# Patient Record
Sex: Female | Born: 1945
Health system: Southern US, Community
[De-identification: ages and names within clinical notes are randomized; demographics above are authoritative.]

## PROBLEM LIST (undated history)

## (undated) DIAGNOSIS — I4891 Unspecified atrial fibrillation: Secondary | ICD-10-CM

## (undated) DIAGNOSIS — I480 Paroxysmal atrial fibrillation: Secondary | ICD-10-CM

## (undated) DIAGNOSIS — E785 Hyperlipidemia, unspecified: Secondary | ICD-10-CM

## (undated) DIAGNOSIS — Z8673 Personal history of transient ischemic attack (TIA), and cerebral infarction without residual deficits: Secondary | ICD-10-CM

## (undated) DIAGNOSIS — K219 Gastro-esophageal reflux disease without esophagitis: Secondary | ICD-10-CM

## (undated) DIAGNOSIS — G4730 Sleep apnea, unspecified: Secondary | ICD-10-CM

## (undated) DIAGNOSIS — I10 Essential (primary) hypertension: Secondary | ICD-10-CM

## (undated) DIAGNOSIS — E119 Type 2 diabetes mellitus without complications: Secondary | ICD-10-CM

## (undated) DIAGNOSIS — A6920 Lyme disease, unspecified: Secondary | ICD-10-CM

## (undated) DIAGNOSIS — F32A Depression, unspecified: Secondary | ICD-10-CM

## (undated) DIAGNOSIS — F329 Major depressive disorder, single episode, unspecified: Secondary | ICD-10-CM

## (undated) DIAGNOSIS — E079 Disorder of thyroid, unspecified: Secondary | ICD-10-CM

## (undated) DIAGNOSIS — G8929 Other chronic pain: Secondary | ICD-10-CM

## (undated) HISTORY — DX: Disorder of thyroid, unspecified: E07.9

## (undated) HISTORY — PX: CERVICAL SPINE SURGERY: SHX589

## (undated) HISTORY — DX: Lyme disease, unspecified: A69.20

## (undated) HISTORY — DX: Depression, unspecified: F32.A

## (undated) HISTORY — DX: Personal history of transient ischemic attack (TIA), and cerebral infarction without residual deficits: Z86.73

## (undated) HISTORY — PX: LUMBAR SPINE SURGERY: SHX701

## (undated) HISTORY — DX: Essential (primary) hypertension: I10

## (undated) HISTORY — PX: CARDIAC CATHETERIZATION: SHX172

## (undated) HISTORY — PX: ATRIAL FIBRILLATION ABLATION: EP1191

## (undated) HISTORY — DX: Hyperlipidemia, unspecified: E78.5

## (undated) HISTORY — DX: Other chronic pain: G89.29

## (undated) HISTORY — DX: Type 2 diabetes mellitus without complications: E11.9

## (undated) HISTORY — PX: TONSILLECTOMY AND ADENOIDECTOMY: SUR1326

## (undated) HISTORY — DX: Gastro-esophageal reflux disease without esophagitis: K21.9

## (undated) HISTORY — DX: Unspecified atrial fibrillation: I48.91

## (undated) HISTORY — DX: Paroxysmal atrial fibrillation: I48.0

---
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
## 2016-11-03 LAB — HM COLONOSCOPY

## 2017-11-07 HISTORY — PX: OTHER SURGICAL HISTORY: SHX169

## 2018-12-07 DIAGNOSIS — Z4509 Encounter for adjustment and management of other cardiac device: Secondary | ICD-10-CM | POA: Diagnosis not present

## 2018-12-28 ENCOUNTER — Ambulatory Visit: Payer: Self-pay | Admitting: Medical

## 2019-01-02 ENCOUNTER — Other Ambulatory Visit: Payer: Self-pay

## 2019-01-02 ENCOUNTER — Encounter: Payer: Self-pay | Admitting: Medical

## 2019-01-02 ENCOUNTER — Ambulatory Visit (INDEPENDENT_AMBULATORY_CARE_PROVIDER_SITE_OTHER): Payer: Medicare HMO | Admitting: Medical

## 2019-01-02 VITALS — BP 170/80 | HR 84 | Temp 98.7°F | Ht 61.25 in | Wt 211.6 lb

## 2019-01-02 DIAGNOSIS — M62838 Other muscle spasm: Secondary | ICD-10-CM | POA: Diagnosis not present

## 2019-01-02 DIAGNOSIS — E785 Hyperlipidemia, unspecified: Secondary | ICD-10-CM | POA: Insufficient documentation

## 2019-01-02 DIAGNOSIS — Z8673 Personal history of transient ischemic attack (TIA), and cerebral infarction without residual deficits: Secondary | ICD-10-CM

## 2019-01-02 DIAGNOSIS — M5440 Lumbago with sciatica, unspecified side: Secondary | ICD-10-CM | POA: Diagnosis not present

## 2019-01-02 DIAGNOSIS — G4733 Obstructive sleep apnea (adult) (pediatric): Secondary | ICD-10-CM | POA: Insufficient documentation

## 2019-01-02 DIAGNOSIS — E119 Type 2 diabetes mellitus without complications: Secondary | ICD-10-CM | POA: Insufficient documentation

## 2019-01-02 DIAGNOSIS — R0683 Snoring: Secondary | ICD-10-CM | POA: Insufficient documentation

## 2019-01-02 DIAGNOSIS — R7989 Other specified abnormal findings of blood chemistry: Secondary | ICD-10-CM | POA: Insufficient documentation

## 2019-01-02 DIAGNOSIS — Z79899 Other long term (current) drug therapy: Secondary | ICD-10-CM | POA: Diagnosis not present

## 2019-01-02 DIAGNOSIS — I10 Essential (primary) hypertension: Secondary | ICD-10-CM | POA: Insufficient documentation

## 2019-01-02 DIAGNOSIS — M542 Cervicalgia: Secondary | ICD-10-CM

## 2019-01-02 DIAGNOSIS — E1169 Type 2 diabetes mellitus with other specified complication: Secondary | ICD-10-CM

## 2019-01-02 DIAGNOSIS — K760 Fatty (change of) liver, not elsewhere classified: Secondary | ICD-10-CM | POA: Diagnosis not present

## 2019-01-02 DIAGNOSIS — G8929 Other chronic pain: Secondary | ICD-10-CM | POA: Insufficient documentation

## 2019-01-02 MED ORDER — GABAPENTIN 800 MG PO TABS: 800.0000 mg | ORAL_TABLET | Freq: Three times a day (TID) | ORAL | 2 refills | Status: DC

## 2019-01-02 MED ORDER — METHOCARBAMOL 500 MG PO TABS: 500.0000 mg | ORAL_TABLET | Freq: Two times a day (BID) | ORAL | 0 refills | Status: DC

## 2019-01-02 MED ORDER — OMEPRAZOLE 20 MG PO CPDR: 20.0000 mg | DELAYED_RELEASE_CAPSULE | Freq: Every day | ORAL | 1 refills | Status: DC

## 2019-01-02 NOTE — Progress Notes (Signed)
Subjective:  Amerah Blomgren is a 73 y.o. female who presents for Chief Complaint  Patient presents with  . New Patient (Initial Visit)    get established-need refills      Here to establish care.  Her husband came in yesterday for the same.  They moved recently from [place] to [facility] area.  She has several medical problems, was seeing several specialist in [place] including cardiology, endocrinology, pain management and neurology.  She is diabetic.  She checks her blood sugars regularly.  Morning sugars are typically running around 150.  Last hemoglobin A1c was 6.8% a few months ago.  No polydipsia, no polyuria, no weight change.  Hypertension-before leaving [place] her metoprolol was stopped and carvedilol was added.  Home blood pressure readings have been fine.  However she notes extreme fatigue in the mornings and sleepiness after taking her morning medications.  This has been going on for months now.  She had a new diagnosis of Lyme disease few months ago was on antibiotic for this.  She notes a history of fatty liver disease and elevated liver test.  Last ultrasound was several years ago  She has chronic pains in her neck and back.  She takes 800 mg gabapentin 3 times a day, tizanidine twice daily, Cymbalta once daily for mood and pain.  She has a history of surgery in the neck and back.  Has a history of management by pain management, has a history of epidural steroid injections.  She also had a spinal stimulator at one point.  She is left-handed. PT in past, was supposed to repeat but covid happened  She does not drink very much alcohol.  She has a history of sleep apnea, last sleep study more than 5 years ago, does not tolerate her current mask.  She reports a history of TIA in past,   No other aggravating or relieving factors.    No other c/o.  The following portions of the patient's history were reviewed and updated as appropriate: allergies,  current medications, past family history, past medical history, past social history, past surgical history and problem list.  ROS Otherwise as in subjective above   Objective: BP (!) 170/80   Pulse 84   Temp 98.7 F (37.1 C)   Ht 5' 1.25" (1.556 m)   Wt 211 lb 9.6 oz (96 kg)   SpO2 98%   BMI 39.66 kg/m   General appearance: alert, no distress, well developed, well nourished Neck: supple, no lymphadenopathy, no thyromegaly, no masses, anterior cervical surgical scar  Heart: 2/6 brief holosystolic murmur heard best in upper sternal borders, RRR, normal S1, S2 Lungs: CTA bilaterally, no wheezes, rhonchi, or rales Abdomen: +bs, soft, non tender, non distended, no masses, no hepatomegaly, no splenomegaly Pulses: 2+ radial pulses, 2+ pedal pulses, normal cap refill Ext: no edema    Assessment: Encounter Diagnoses  Name Primary?  . Type 2 diabetes mellitus with other specified complication, without long-term current use of insulin ([place]) Yes  . Hyperlipidemia, unspecified hyperlipidemia type   . Chronic bilateral low back pain with sciatica, sciatica laterality unspecified   . High risk medication use   . History of TIA (transient ischemic attack)   . Muscle spasm   . Chronic neck pain   . Essential hypertension, benign   . Fatty liver   . Elevated LFTs   . Snoring   . OSA (obstructive sleep apnea)      Plan: I reviewed her  chart history, her medical records on her new patient form.  We will request prior records.  We will go ahead make referral for cardiology to get her established here.  Labs as below today.  We discussed goals for blood pressure and diabetes, routine follow-up.  Her main concern today is chronic pain in the neck and back.  She also gets quite a bit of sedation on these medications.  I will change her tizanidine to Robaxin to see if this makes any difference.  Consider lowering the morning dose of gabapentin.  We discussed risks and benefits of these  medications.  Consider pain management consult.  Fatty liver disease-discussed the need for weight loss, healthy diet and exercise.  It may be time to update ultrasound.  OSA-discussed possibly seeing home health care about mask fitting since she does not tolerate her current mask  Hypertension-continue current medication  hyperlipidemia-continue Zetia, not tolerated statins in the past  Margalit was seen today for new patient (initial visit).  Diagnoses and all orders for this visit:  Type 2 diabetes mellitus with other specified complication, without long-term current use of insulin (HCC) -     Comprehensive metabolic panel -     Hemoglobin A1c -     Lipid Panel -     Ambulatory referral to Cardiology  Hyperlipidemia, unspecified hyperlipidemia type -     Comprehensive metabolic panel -     Hemoglobin A1c -     Lipid Panel -     Ambulatory referral to Cardiology  Chronic bilateral low back pain with sciatica, sciatica laterality unspecified  High risk medication use -     Comprehensive metabolic panel -     Hemoglobin A1c -     Lipid Panel  History of TIA (transient ischemic attack) -     Ambulatory referral to Cardiology  Muscle spasm  Chronic neck pain  Essential hypertension, benign  Fatty liver  Elevated LFTs  Snoring  OSA (obstructive sleep apnea)  Other orders -     gabapentin (NEURONTIN) 800 MG tablet; Take 1 tablet (800 mg total) by mouth 3 (three) times daily. -     methocarbamol (ROBAXIN) 500 MG tablet; Take 1 tablet (500 mg total) by mouth 2 (two) times daily. -     omeprazole (PRILOSEC) 20 MG capsule; Take 1 capsule (20 mg total) by mouth daily.    Follow up: Pending labs

## 2019-01-03 ENCOUNTER — Encounter: Payer: Self-pay | Admitting: Medical

## 2019-01-03 LAB — COMPREHENSIVE METABOLIC PANEL
ALT: 76 IU/L — ABNORMAL HIGH (ref 0–32)
AST: 88 IU/L — ABNORMAL HIGH (ref 0–40)
Albumin/Globulin Ratio: 1.8 (ref 1.2–2.2)
Albumin: 4.5 g/dL (ref 3.7–4.7)
Alkaline Phosphatase: 120 IU/L — ABNORMAL HIGH (ref 39–117)
BUN/Creatinine Ratio: 18 (ref 12–28)
BUN: 13 mg/dL (ref 8–27)
Bilirubin Total: 0.3 mg/dL (ref 0.0–1.2)
CO2: 21 mmol/L (ref 20–29)
Calcium: 10.1 mg/dL (ref 8.7–10.3)
Chloride: 103 mmol/L (ref 96–106)
Creatinine, Ser: 0.74 mg/dL (ref 0.57–1.00)
GFR calc Af Amer: 94 mL/min/{1.73_m2} (ref >59)
GFR calc non Af Amer: 82 mL/min/{1.73_m2} (ref >59)
Globulin, Total: 2.5 g/dL (ref 1.5–4.5)
Glucose: 159 mg/dL — ABNORMAL HIGH (ref 65–99)
Potassium: 4.9 mmol/L (ref 3.5–5.2)
Sodium: 140 mmol/L (ref 134–144)
Total Protein: 7 g/dL (ref 6.0–8.5)

## 2019-01-03 LAB — HEMOGLOBIN A1C
Est. average glucose Bld gHb Est-mCnc: 160 mg/dL
Hgb A1c MFr Bld: 7.2 % — ABNORMAL HIGH (ref 4.8–5.6)

## 2019-01-03 LAB — LIPID PANEL
Chol/HDL Ratio: 5.1 ratio — ABNORMAL HIGH (ref 0.0–4.4)
Cholesterol, Total: 226 mg/dL — ABNORMAL HIGH (ref 100–199)
HDL: 44 mg/dL (ref >39)
LDL Chol Calc (NIH): 149 mg/dL — ABNORMAL HIGH (ref 0–99)
Triglycerides: 182 mg/dL — ABNORMAL HIGH (ref 0–149)
VLDL Cholesterol Cal: 33 mg/dL (ref 5–40)

## 2019-01-18 ENCOUNTER — Encounter: Payer: Self-pay | Admitting: Medical

## 2019-01-23 DIAGNOSIS — Z760 Encounter for issue of repeat prescription: Secondary | ICD-10-CM | POA: Diagnosis not present

## 2019-01-30 DIAGNOSIS — K30 Functional dyspepsia: Secondary | ICD-10-CM | POA: Diagnosis not present

## 2019-01-30 DIAGNOSIS — G629 Polyneuropathy, unspecified: Secondary | ICD-10-CM | POA: Diagnosis not present

## 2019-01-30 DIAGNOSIS — I10 Essential (primary) hypertension: Secondary | ICD-10-CM | POA: Diagnosis not present

## 2019-01-30 DIAGNOSIS — F419 Anxiety disorder, unspecified: Secondary | ICD-10-CM | POA: Diagnosis not present

## 2019-01-30 DIAGNOSIS — M069 Rheumatoid arthritis, unspecified: Secondary | ICD-10-CM | POA: Diagnosis not present

## 2019-01-30 DIAGNOSIS — E119 Type 2 diabetes mellitus without complications: Secondary | ICD-10-CM | POA: Diagnosis not present

## 2019-01-30 DIAGNOSIS — E039 Hypothyroidism, unspecified: Secondary | ICD-10-CM | POA: Diagnosis not present

## 2019-01-30 DIAGNOSIS — I482 Chronic atrial fibrillation, unspecified: Secondary | ICD-10-CM | POA: Diagnosis not present

## 2019-01-30 DIAGNOSIS — E7800 Pure hypercholesterolemia, unspecified: Secondary | ICD-10-CM | POA: Diagnosis not present

## 2019-02-05 ENCOUNTER — Other Ambulatory Visit: Payer: Self-pay | Admitting: Medical

## 2019-02-07 NOTE — Progress Notes (Deleted)
Referring-David Tysinger, PA-C Reason for referral-hypertension and hyperlipidemia.  HPI: 74 year old female for evaluation of hypertension and hyperlipidemia at request of Chana Bode, PA-C.  Patient previously resided in [place].  Current Outpatient Medications  Medication Sig Dispense Refill  . apixaban (ELIQUIS) 5 MG TABS tablet Take 5 mg by mouth 2 (two) times daily.    Marland Kitchen aspirin 81 MG EC tablet Take 81 mg by mouth daily. Swallow whole.    . carvedilol (COREG) 25 MG tablet Take 25 mg by mouth 2 (two) times daily with a meal.    . diltiazem (DILACOR XR) 180 MG 24 hr capsule Take 180 mg by mouth daily.    . DULoxetine (CYMBALTA) 60 MG capsule Take 60 mg by mouth daily.    Marland Kitchen ezetimibe (ZETIA) 10 MG tablet Take 10 mg by mouth daily.    . folic acid (FOLVITE) 1 MG tablet Take 1 mg by mouth daily.    Marland Kitchen gabapentin (NEURONTIN) 800 MG tablet Take 1 tablet (800 mg total) by mouth 3 (three) times daily. 90 tablet 2  . levothyroxine (SYNTHROID) 75 MCG tablet Take 75 mcg by mouth daily before breakfast.    . lisinopril (ZESTRIL) 10 MG tablet Take 10 mg by mouth daily.    . methocarbamol (ROBAXIN) 500 MG tablet TAKE 1 TABLET BY MOUTH TWICE A DAY 60 tablet 0  . omeprazole (PRILOSEC) 20 MG capsule Take 20 mg by mouth daily.    Marland Kitchen omeprazole (PRILOSEC) 20 MG capsule Take 1 capsule (20 mg total) by mouth daily. 90 capsule 1  . OXYBUTYNIN CHLORIDE PO Take 10 mg by mouth daily.    . sitaGLIPtin-metformin (JANUMET) 50-1000 MG tablet Take 1 tablet by mouth 2 (two) times daily with a meal.    . tiZANidine (ZANAFLEX) 4 MG tablet Take 4 mg by mouth every 12 (twelve) hours as needed for muscle spasms.     No current facility-administered medications for this visit.    Allergies  Allergen Reactions  . Baclofen Swelling  . Crestor [Rosuvastatin]     Extreme myalgias  . Lipitor [Atorvastatin]     Extreme myalgias  . Verapamil Swelling    Past Medical History:  Diagnosis Date  . Chronic pain     . Depression   . Diabetes mellitus without complication ([place])   . GERD (gastroesophageal reflux disease)   . History of TIA (transient ischemic attack)   . Hyperlipidemia   . Hypertension   . Thyroid disease     *** The histories are not reviewed yet. Please review them in the "History" navigator section and refresh this [place].  Social History   Socioeconomic History  . Marital status: Married    Spouse name: Not on file  . Number of children: Not on file  . Years of education: Not on file  . Highest education level: Not on file  Occupational History  . Not on file  Tobacco Use  . Smoking status: Former Research scientist (life sciences)  . Smokeless tobacco: Never Used  Substance and Sexual Activity  . Alcohol use: Yes  . Drug use: Never  . Sexual activity: Not on file  Other Topics Concern  . Not on file  Social History Narrative   From [place], married, Cyril Witness, new from [place] to [facility] 12/2018   Social Determinants of Health   Financial Resource Strain:   . Difficulty of Paying Living Expenses: Not on file  Food Insecurity:   . Worried About Estate manager/land agent  of Food in the Last Year: Not on file  . Ran Out of Food in the Last Year: Not on file  Transportation Needs:   . Lack of Transportation (Medical): Not on file  . Lack of Transportation (Non-Medical): Not on file  Physical Activity:   . Days of Exercise per Week: Not on file  . Minutes of Exercise per Session: Not on file  Stress:   . Feeling of Stress : Not on file  Social Connections:   . Frequency of Communication with Friends and Family: Not on file  . Frequency of Social Gatherings with Friends and Family: Not on file  . Attends Religious Services: Not on file  . Active Member of Clubs or Organizations: Not on file  . Attends Archivist Meetings: Not on file  . Marital Status: Not on file  Intimate Partner Violence:   . Fear of Current or Ex-Partner: Not on file  . Emotionally  Abused: Not on file  . Physically Abused: Not on file  . Sexually Abused: Not on file    No family history on file.  ROS: no fevers or chills, productive cough, hemoptysis, dysphasia, odynophagia, melena, hematochezia, dysuria, hematuria, rash, seizure activity, orthopnea, PND, pedal edema, claudication. Remaining systems are negative.  Physical Exam:   There were no vitals taken for this visit.  General:  Well developed/well nourished in NAD Skin warm/dry Patient not depressed No peripheral clubbing Back-normal HEENT-normal/normal eyelids Neck supple/normal carotid upstroke bilaterally; no bruits; no JVD; no thyromegaly chest - CTA/ normal expansion CV - RRR/normal S1 and S2; no murmurs, rubs or gallops;  PMI nondisplaced Abdomen -NT/ND, no HSM, no mass, + bowel sounds, no bruit 2+ femoral pulses, no bruits Ext-no edema, chords, 2+ DP Neuro-grossly nonfocal  ECG - personally reviewed  A/P  1 hypertension-  2 hyperlipidemia-  Kirk Ruths, MD

## 2019-02-14 ENCOUNTER — Ambulatory Visit: Payer: Medicare HMO | Admitting: Cardiology

## 2019-02-21 DIAGNOSIS — K30 Functional dyspepsia: Secondary | ICD-10-CM | POA: Diagnosis not present

## 2019-02-21 DIAGNOSIS — G629 Polyneuropathy, unspecified: Secondary | ICD-10-CM | POA: Diagnosis not present

## 2019-02-21 DIAGNOSIS — I482 Chronic atrial fibrillation, unspecified: Secondary | ICD-10-CM | POA: Diagnosis not present

## 2019-02-21 DIAGNOSIS — E7800 Pure hypercholesterolemia, unspecified: Secondary | ICD-10-CM | POA: Diagnosis not present

## 2019-02-21 DIAGNOSIS — F419 Anxiety disorder, unspecified: Secondary | ICD-10-CM | POA: Diagnosis not present

## 2019-02-21 DIAGNOSIS — E119 Type 2 diabetes mellitus without complications: Secondary | ICD-10-CM | POA: Diagnosis not present

## 2019-02-21 DIAGNOSIS — M069 Rheumatoid arthritis, unspecified: Secondary | ICD-10-CM | POA: Diagnosis not present

## 2019-02-21 DIAGNOSIS — I10 Essential (primary) hypertension: Secondary | ICD-10-CM | POA: Diagnosis not present

## 2019-02-21 DIAGNOSIS — E039 Hypothyroidism, unspecified: Secondary | ICD-10-CM | POA: Diagnosis not present

## 2019-02-28 ENCOUNTER — Other Ambulatory Visit: Payer: Self-pay | Admitting: Medical

## 2019-02-28 NOTE — Telephone Encounter (Signed)
[facility] is requesting to fill pt robaxin. Please advise [facility]

## 2019-02-28 NOTE — Telephone Encounter (Signed)
Schedule fasting f/u appt

## 2019-03-01 NOTE — Telephone Encounter (Signed)
Patient is in [place] and won't be back until May. She is seeing an urgent care doctor in [place] until she gets back here. She will check with urgent care.

## 2019-03-21 DIAGNOSIS — I482 Chronic atrial fibrillation, unspecified: Secondary | ICD-10-CM | POA: Diagnosis not present

## 2019-03-21 DIAGNOSIS — E782 Mixed hyperlipidemia: Secondary | ICD-10-CM | POA: Diagnosis not present

## 2019-03-21 DIAGNOSIS — I10 Essential (primary) hypertension: Secondary | ICD-10-CM | POA: Diagnosis not present

## 2019-03-21 DIAGNOSIS — F419 Anxiety disorder, unspecified: Secondary | ICD-10-CM | POA: Diagnosis not present

## 2019-03-21 DIAGNOSIS — K30 Functional dyspepsia: Secondary | ICD-10-CM | POA: Diagnosis not present

## 2019-03-21 DIAGNOSIS — D539 Nutritional anemia, unspecified: Secondary | ICD-10-CM | POA: Diagnosis not present

## 2019-03-21 DIAGNOSIS — M069 Rheumatoid arthritis, unspecified: Secondary | ICD-10-CM | POA: Diagnosis not present

## 2019-03-21 DIAGNOSIS — E119 Type 2 diabetes mellitus without complications: Secondary | ICD-10-CM | POA: Diagnosis not present

## 2019-03-21 DIAGNOSIS — E7800 Pure hypercholesterolemia, unspecified: Secondary | ICD-10-CM | POA: Diagnosis not present

## 2019-03-21 DIAGNOSIS — G629 Polyneuropathy, unspecified: Secondary | ICD-10-CM | POA: Diagnosis not present

## 2019-03-21 DIAGNOSIS — E039 Hypothyroidism, unspecified: Secondary | ICD-10-CM | POA: Diagnosis not present

## 2019-03-26 ENCOUNTER — Other Ambulatory Visit: Payer: Self-pay | Admitting: Medical

## 2019-03-27 NOTE — Telephone Encounter (Signed)
Needs fasting med check or phsyical if last visit was not physical.   Due for f/u now before refills

## 2019-03-27 NOTE — Telephone Encounter (Signed)
Called pt and she is in [place] and wont be back until around May. She said she will call and schedule once she has an accurate date

## 2019-03-29 ENCOUNTER — Telehealth: Payer: Self-pay | Admitting: Family Medicine

## 2019-03-29 NOTE — Telephone Encounter (Signed)
PT in [place] until May.  She needs AWV/cpe.  Email sent

## 2019-04-16 ENCOUNTER — Other Ambulatory Visit: Payer: Self-pay | Admitting: Medical

## 2019-04-18 DIAGNOSIS — E119 Type 2 diabetes mellitus without complications: Secondary | ICD-10-CM | POA: Diagnosis not present

## 2019-04-18 DIAGNOSIS — G629 Polyneuropathy, unspecified: Secondary | ICD-10-CM | POA: Diagnosis not present

## 2019-04-18 DIAGNOSIS — E039 Hypothyroidism, unspecified: Secondary | ICD-10-CM | POA: Diagnosis not present

## 2019-04-18 DIAGNOSIS — E7800 Pure hypercholesterolemia, unspecified: Secondary | ICD-10-CM | POA: Diagnosis not present

## 2019-04-18 DIAGNOSIS — M069 Rheumatoid arthritis, unspecified: Secondary | ICD-10-CM | POA: Diagnosis not present

## 2019-04-18 DIAGNOSIS — K30 Functional dyspepsia: Secondary | ICD-10-CM | POA: Diagnosis not present

## 2019-04-18 DIAGNOSIS — F419 Anxiety disorder, unspecified: Secondary | ICD-10-CM | POA: Diagnosis not present

## 2019-04-18 DIAGNOSIS — I10 Essential (primary) hypertension: Secondary | ICD-10-CM | POA: Diagnosis not present

## 2019-04-18 DIAGNOSIS — I482 Chronic atrial fibrillation, unspecified: Secondary | ICD-10-CM | POA: Diagnosis not present

## 2019-04-27 ENCOUNTER — Telehealth: Payer: Self-pay

## 2019-04-27 NOTE — Telephone Encounter (Signed)
I received a fax from [facility] for a refill on the pts. Oxybutynin, Glyburide, and Methocarbamol. Pt. Last apt was 01/02/19.

## 2019-04-27 NOTE — Telephone Encounter (Signed)
Is this appropriate?  

## 2019-04-30 ENCOUNTER — Other Ambulatory Visit: Payer: Self-pay | Admitting: Medical

## 2019-04-30 ENCOUNTER — Other Ambulatory Visit: Payer: Self-pay

## 2019-04-30 MED ORDER — METHOCARBAMOL 500 MG PO TABS: 500.0000 mg | ORAL_TABLET | Freq: Two times a day (BID) | ORAL | 0 refills | Status: DC

## 2019-04-30 NOTE — Telephone Encounter (Signed)
Patient has an appointment on 4/28. Would you still like to send 30 day now?

## 2019-04-30 NOTE — Telephone Encounter (Signed)
She is due for follow up now , so make sure we have appt on the books fasting  Reconcile dose and medication info for Glyburide and Oxybutynin as Glyburide doesn't even show up, and I don't think I was aware she was on this, and verify dosing for oxybutynin.  You can send 30 day supply of Glyburide and Oxybutynin to get her by until appt

## 2019-04-30 NOTE — Telephone Encounter (Signed)
Lmom for patient to call and inform us whether she is taking Oxybutynin and Glyburide.

## 2019-04-30 NOTE — Telephone Encounter (Signed)
I sent the methocarbamol already.   Yes verify the 2 others and send please.   They are not updated in the chart

## 2019-05-02 ENCOUNTER — Other Ambulatory Visit: Payer: Self-pay

## 2019-05-02 ENCOUNTER — Ambulatory Visit (INDEPENDENT_AMBULATORY_CARE_PROVIDER_SITE_OTHER): Payer: Medicare HMO | Admitting: Medical

## 2019-05-02 ENCOUNTER — Encounter: Payer: Self-pay | Admitting: Medical

## 2019-05-02 VITALS — BP 174/96 | HR 96 | Temp 98.1°F | Ht 61.25 in | Wt 215.4 lb

## 2019-05-02 DIAGNOSIS — M5440 Lumbago with sciatica, unspecified side: Secondary | ICD-10-CM

## 2019-05-02 DIAGNOSIS — Z79899 Other long term (current) drug therapy: Secondary | ICD-10-CM | POA: Diagnosis not present

## 2019-05-02 DIAGNOSIS — R0789 Other chest pain: Secondary | ICD-10-CM | POA: Diagnosis not present

## 2019-05-02 DIAGNOSIS — Z0000 Encounter for general adult medical examination without abnormal findings: Secondary | ICD-10-CM | POA: Diagnosis not present

## 2019-05-02 DIAGNOSIS — E785 Hyperlipidemia, unspecified: Secondary | ICD-10-CM | POA: Diagnosis not present

## 2019-05-02 DIAGNOSIS — Z6841 Body Mass Index (BMI) 40.0 and over, adult: Secondary | ICD-10-CM | POA: Diagnosis not present

## 2019-05-02 DIAGNOSIS — E039 Hypothyroidism, unspecified: Secondary | ICD-10-CM | POA: Insufficient documentation

## 2019-05-02 DIAGNOSIS — Z136 Encounter for screening for cardiovascular disorders: Secondary | ICD-10-CM | POA: Insufficient documentation

## 2019-05-02 DIAGNOSIS — Z8673 Personal history of transient ischemic attack (TIA), and cerebral infarction without residual deficits: Secondary | ICD-10-CM

## 2019-05-02 DIAGNOSIS — I10 Essential (primary) hypertension: Secondary | ICD-10-CM

## 2019-05-02 DIAGNOSIS — K760 Fatty (change of) liver, not elsewhere classified: Secondary | ICD-10-CM | POA: Diagnosis not present

## 2019-05-02 DIAGNOSIS — E041 Nontoxic single thyroid nodule: Secondary | ICD-10-CM | POA: Insufficient documentation

## 2019-05-02 DIAGNOSIS — R7989 Other specified abnormal findings of blood chemistry: Secondary | ICD-10-CM

## 2019-05-02 DIAGNOSIS — G4733 Obstructive sleep apnea (adult) (pediatric): Secondary | ICD-10-CM

## 2019-05-02 DIAGNOSIS — R0683 Snoring: Secondary | ICD-10-CM

## 2019-05-02 DIAGNOSIS — Z1231 Encounter for screening mammogram for malignant neoplasm of breast: Secondary | ICD-10-CM

## 2019-05-02 DIAGNOSIS — M65321 Trigger finger, right index finger: Secondary | ICD-10-CM | POA: Insufficient documentation

## 2019-05-02 DIAGNOSIS — M542 Cervicalgia: Secondary | ICD-10-CM

## 2019-05-02 DIAGNOSIS — E1169 Type 2 diabetes mellitus with other specified complication: Secondary | ICD-10-CM

## 2019-05-02 DIAGNOSIS — G8929 Other chronic pain: Secondary | ICD-10-CM

## 2019-05-02 DIAGNOSIS — R0989 Other specified symptoms and signs involving the circulatory and respiratory systems: Secondary | ICD-10-CM | POA: Insufficient documentation

## 2019-05-02 DIAGNOSIS — M62838 Other muscle spasm: Secondary | ICD-10-CM

## 2019-05-02 MED ORDER — ALPRAZOLAM 0.25 MG PO TABS: 0.2500 mg | ORAL_TABLET | Freq: Two times a day (BID) | ORAL | 0 refills | Status: DC | PRN

## 2019-05-02 NOTE — Telephone Encounter (Signed)
Patient was seen in office today.  

## 2019-05-02 NOTE — Progress Notes (Addendum)
Subjective:    Sandra Ruiz is a 74 y.o. female who presents for Preventative Services visit and chronic medical problems/med check visit.    Primary Care Provider Tysinger, David S, PA-C here for primary care  Current Health Care Team:  Dentist, n/a  Eye doctor, n/a  Medical Services you may have received from other than [facility] providers in the past year (date may be approximate) none  Exercise Current exercise habits: stretching 3 days a week for 30 minutes.    Nutrition/Diet Current diet: no particular discretion  Depression Screen Depression screen PHQ 2/9 05/02/2019  Decreased Interest 0  Down, Depressed, Hopeless 0  PHQ - 2 Score 0    Activities of Daily Living Screen/Functional Status Survey Is the patient deaf or have difficulty hearing?: No Does the patient have difficulty seeing, even when wearing glasses/contacts?: Yes Does the patient have difficulty concentrating, remembering, or making decisions?: Yes(sometimes) Does the patient have difficulty walking or climbing stairs?: Yes Does the patient have difficulty dressing or bathing?: No Does the patient have difficulty doing errands alone such as visiting a doctor's office or shopping?: Yes  Can patient draw a clock face showing 3:15 oclock, yes  Fall Risk Screen Fall Risk  05/02/2019 01/02/2019  Falls in the past year? 1 1  Number falls in past yr: 1 1  Injury with Fall? 0 0    Gait Assessment: Normal gait observed  - yes  Advanced directives Does patient have a Health Care Power of Attorney? No Does patient have a Living Will? No  Past Medical History:  Diagnosis Date  . Chronic pain   . Depression   . Diabetes mellitus without complication (HCC)   . GERD (gastroesophageal reflux disease)   . History of TIA (transient ischemic attack)   . Hyperlipidemia   . Hypertension   . Thyroid disease      Social History   Socioeconomic History  . Marital status: Married    Spouse name: Not on file   . Number of children: Not on file  . Years of education: Not on file  . Highest education level: Not on file  Occupational History  . Not on file  Tobacco Use  . Smoking status: Former Smoker  . Smokeless tobacco: Never Used  Substance and Sexual Activity  . Alcohol use: Yes  . Drug use: Never  . Sexual activity: Not on file  Other Topics Concern  . Not on file  Social History Narrative   From [place], married, Jehov[employer]'s Witness, new from [place] to [place] 12/2018   Social Determinants of Health   Financial Resource Strain:   . Difficulty of Paying Living Expenses:   Food Insecurity:   . Worried About Running Out of Food in the Last Year:   . Ran Out of Food in the Last Year:   Transportation Needs:   . Lack of Transportation (Medical):   . Lack of Transportation (Non-Medical):   Physical Activity:   . Days of Exercise per Week:   . Minutes of Exercise per Session:   Stress:   . Feeling of Stress :   Social Connections:   . Frequency of Communication with Friends and Family:   . Frequency of Social Gatherings with Friends and Family:   . Attends Religious Services:   . Active Member of Clubs or Organizations:   . Attends Club or Organization Meetings:   . Marital Status:   Intimate Partner Violence:   . Fear of Current   or Ex-Partner:   . Emotionally Abused:   . Physically Abused:   . Sexually Abused:     No family history on file.   Current Outpatient Medications:  .  ACCU-CHEK GUIDE test strip, See admin instructions., Disp: , Rfl:  .  apixaban (ELIQUIS) 5 MG TABS tablet, Take 5 mg by mouth 2 (two) times daily., Disp: , Rfl:  .  Blood Glucose Monitoring Suppl (ACCU-CHEK GUIDE ME) w/Device KIT, See admin instructions., Disp: , Rfl:  .  carvedilol (COREG) 25 MG tablet, Take 25 mg by mouth 2 (two) times daily with a meal., Disp: , Rfl:  .  diltiazem (DILACOR XR) 180 MG 24 hr capsule, Take 180 mg by mouth daily., Disp: , Rfl:  .  DULoxetine  (CYMBALTA) 60 MG capsule, Take 60 mg by mouth daily., Disp: , Rfl:  .  ezetimibe (ZETIA) 10 MG tablet, Take 10 mg by mouth daily., Disp: , Rfl:  .  folic acid (FOLVITE) 1 MG tablet, Take 1 mg by mouth daily., Disp: , Rfl:  .  gabapentin (NEURONTIN) 800 MG tablet, TAKE 1 TABLET BY MOUTH THREE TIMES A DAY, Disp: 90 tablet, Rfl: 0 .  glyBURIDE (DIABETA) 1.25 MG tablet, Take 1.25 mg by mouth every morning., Disp: , Rfl:  .  levothyroxine (SYNTHROID) 75 MCG tablet, Take 75 mcg by mouth daily before breakfast., Disp: , Rfl:  .  lisinopril (ZESTRIL) 30 MG tablet, Take 30 mg by mouth daily., Disp: , Rfl:  .  methocarbamol (ROBAXIN) 500 MG tablet, Take 1 tablet (500 mg total) by mouth 2 (two) times daily., Disp: 60 tablet, Rfl: 0 .  omeprazole (PRILOSEC) 20 MG capsule, Take 1 capsule (20 mg total) by mouth daily., Disp: 90 capsule, Rfl: 1 .  oxybutynin (DITROPAN-XL) 10 MG 24 hr tablet, Take 10 mg by mouth 2 (two) times daily., Disp: , Rfl:  .  sitaGLIPtin-metformin (JANUMET) 50-1000 MG tablet, Take 1 tablet by mouth 2 (two) times daily with a meal., Disp: , Rfl:  .  ALPRAZolam (XANAX) 0.25 MG tablet, Take 1 tablet (0.25 mg total) by mouth 2 (two) times daily as needed for anxiety., Disp: 20 tablet, Rfl: 0 .  aspirin 81 MG EC tablet, Take 81 mg by mouth daily. Swallow whole., Disp: , Rfl:   Allergies  Allergen Reactions  . Baclofen Swelling  . Crestor [Rosuvastatin]     Extreme myalgias  . Lipitor [Atorvastatin]     Extreme myalgias  . Verapamil Swelling    History reviewed: allergies, current medications, past family history, past medical history, past social history, past surgical history and problem list  Specific issues discussed: She was a new patient to me in December 2020.  This is her second visit here.  She has several medical problems, was seeing several specialist in [place] including cardiology, endocrinology, pain management and neurology.  She is diabetic.    She is not checking  her sugars every single day at the moment.  For the last several months she and her husband were staying down in [place] since last visit here.  She saw a doctor in [place] briefly who recently had glyburide.  She does not like the way this makes her feel.  She has longstanding numbness in feet.  No new foot lesions.  No polyuria or polydipsia.  She is compliant with Janumet  Hypertension-she has been taking diltiazem daily.  She saw the other doctor in [place] recently and was increased to lisinopril 30 mg daily.  She   just realized today that she has not been taking her carvedilol for several weeks at least    She notes a history of fatty liver disease and elevated liver test.  Last ultrasound was several years ago  She notes a history of thyroid enlargement and 3 nodules.  She is due for ultrasound of thyroid.  She was seeing endocrinology back in [place]   She has chronic pains in her neck and back.  She takes 800 mg gabapentin 3 times a day.  Last visit we changed her to Robaxin.  She continues on Cymbalta.  Lately she has been more pain particularly with the hands.  Her right index finger locks and has a trigger action.  She has had steroid shots for this in the past.  She has a history of surgery in the neck and back.  Has a history of management by pain management, has a history of epidural steroid injections.  She also had a spinal stimulator at one point.  She is left-handed.  She notes her current pain is not controlled  She does not drink very much alcohol.  She has a history of sleep apnea, last sleep study more than 5 years ago, does not tolerate her current mask.  She reports a history of TIA in past,   She has been a little bit more anxious lately given her blood pressures being up, not tolerating the new medicines as well, and getting confused about her medications.  She would like a few Xanax to help with calming her nerves.  She has used this in the past and lately just does  not feel good  She has her first appointment with cardiology with Dr. Crenshaw May 26  She is compliant with Eliquis  She has felt some heaviness in the chest of late, not sure if this is related to her anxiety or pain in general heart.  No specific shortness of breath, palpitations, syncope or dizziness    Objective:      Biometrics BP (!) 174/96   Pulse 96   Temp 98.1 F (36.7 C)   Ht 5' 1.25" (1.556 m)   Wt 215 lb 6.4 oz (97.7 kg)   SpO2 94%   BMI 40.37 kg/m   Cognitive Testing  Alert? Yes  Normal Appearance?Yes  Oriented to person? Yes  Place? Yes   Time? Yes  Recall of three objects?  Yes  Can perform simple calculations? Yes  Displays appropriate judgment?Yes  Can read the correct time from a watch face?Yes  General appearance: alert, no distress, WD/WN, white female  Nutritional Status: Inadequate calore intake? no Loss of muscle mass? no Loss of fat beneath skin? no Localized or general edema? no Diminished functional status? no  Other pertinent exam: Neck: supple, no lymphadenopathy, no thyromegaly, no masses, questionable left carotid bruit Heart: RRR, normal S1, S2, questionable systolic murmurs heard in upper sternal borders Lungs: CTA bilaterally, no wheezes, rhonchi, or rales Abdomen: +bs, soft, non tender, non distended, no masses, no hepatomegaly, no splenomegaly Musculoskeletal: +lumbar surgical scar, nontender, no swelling, no obvious deformity Extremities: no edema, no cyanosis, no clubbing Pulses: 2+ symmetric, upper and 1+ extremities, normal cap refill Neurological: alert, oriented x 3, CN2-12 intact, strength normal upper extremities and lower extremities, sensation normal throughout, DTRs 2+ throughout, no cerebellar signs, gait normal Psychiatric: seems a little upset today, in pain othewrise pleasant , answers questions appropriately Ext: no edema  Diabetic Foot Exam - Simple   Simple Foot Form   Diabetic Foot exam was performed with  the following findings: Yes 05/02/2019  1:40 PM  Visual Inspection No deformities, no ulcerations, no other skin breakdown bilaterally: Yes Sensation Testing See comments: Yes Pulse Check See comments: Yes Comments 1+ pedal pulses, unable to feel monofilament sensation throughout     EKG Indication chest discomfort, hypertension Rate 88 bpm, PR 150 ms, QRS 82 ms, QTC 438 ms, axis 62 degrees, normal sinus rhythm, no acute changes   Assessment:   Encounter Diagnoses  Name Primary?  . Encounter for health maintenance examination in adult Yes  . Medicare annual wellness visit, subsequent   . Thyroid nodule   . Essential hypertension, benign   . OSA (obstructive sleep apnea)   . Fatty liver   . Type 2 diabetes mellitus with other specified complication, without long-term current use of insulin (HCC)   . Hyperlipidemia, unspecified hyperlipidemia type   . High risk medication use   . History of TIA (transient ischemic attack)   . Chronic bilateral low back pain with sciatica, sciatica laterality unspecified   . Chronic neck pain   . Elevated LFTs   . Snoring   . Muscle spasm   . Trigger index finger of right hand   . Encounter for screening mammogram for malignant neoplasm of breast   . Hypothyroidism, unspecified type   . Bruit   . Encounter for screening for vascular disease   . Chest discomfort      Plan:   A preventative services visit was completed today.  During the course of the visit today, we discussed and counseled about appropriate screening and preventive services.  A health risk assessment was established today that included a review of current medications, allergies, social history, family history, medical and preventative health history, biometrics, and preventative screenings to identify potential safety concerns or impairments.  A personalized plan was printed today for your records and use.   Personalized health advice and education was given today to  reduce health risks and promote self management and wellness.  Information regarding end of life planning was discussed today.  I reviewed the recent comprehensive panel of labs she does have done about 2 weeks ago  Recommendations:  I recommend a yearly ophthalmology/optometry visit for glaucoma screening and eye checkup  I recommended a yearly dental visit for hygiene and checkup  Advanced directives - discussed nature and purpose of Advanced Directives, encouraged them to complete them if they have not done so and/or encouraged them to get us a copy if they have done this already.  Diabetes   Check your feet daily for wounds or sores not healing  Avoid going barefooted  See your eye doctor every year for diabetic eye screening  Check your blood sugars every day fasting in the morning and write these numbers down  Continue glyburide just recently added in [place]  Continue Janumet, but increase to twice daily  I recommend an updated blood pressure screening in your legs called ABI.  This is a screen for vascular disease for diabetics.  If agreeable we can schedule this.  Hypothyroidism  Continue levothyroxine 75 mcg daily first thing the morning on empty stomach, 1 hour before any other medications  High blood pressure   Continue diltiazem 180 mg daily  Continue lisinopril 30 mg daily, recently increased in [place]  Restart Carvedilol 25 mg, taper up as we discussed -1/4 tablet twice daily for 3 days, 1/2 tablet twice daily for 3 days, then increase to 1 whole   tablet twice daily  Follow-up with cardiology soon as planned  High cholesterol   Zetia is not working  I recommend we add a statin such as Livalo or Pravachol If you are not willing to do this given prior problems with statins such as Crestor and Lipitor, we may need to try a different medicine such as Praluent which is an injection every other week  Recommendations for improving lipids:  Foods TO AVOID or  limit - fried foods, high sugar foods, white bread, enriched flour, fast food, red meat, large amounts of cheese, processed foods such as little debbie cakes, cookies, pies, donuts, for example  Foods TO INCLUDE in the diet - whole grains such as whole grain pasta, whole grain bread, barley, steel cut oatmeal (not instant oatmeal), avocado, fish, green leafy vegetables, nuts, increased fiber in diet, and using olive oil in small amounts for cooking or as salad dressing vinaigrette.    Obesity and fatty liver disease - work on eating a healthy low fat diet and getting exercise to try and lose weight  Sleep apnea -I recommend you either restart CPAP, try different type of CPAP mask or repeat sleep study if you no longer have CPAP device at home  Chronic neck and back pain, trigger finger-we will refer you to orthopedist here locally  For now continue your current medications including Cymbalta, gabapentin, and muscle relaxer Robaxin  History of stroke  You should be on a statin or other medicine to more effectively lower your cholesterol  Continue Eliquis twice daily     Patient Instructions  Recommendations:   Vaccines: Typical vaccines none at this stage would include Shingrix shingles vaccine, yearly flu shot, 2 separate pneumonia vaccines, and tetanus vaccine every 10 years  You recently had your Covid vaccine.  Get your flu shot in the fall such as September  Shingles vaccine:  I recommend you have a shingles vaccine to help prevent shingles or herpes zoster outbreak.   Please call your insurer to inquire about coverage for the Shingrix vaccine given in 2 doses.   Some insurers cover this vaccine after age 23, some cover this after age 71.  If your insurer covers this, then call to schedule appointment to have this vaccine here.  Please try to get [place] a copy of your pneumonia vaccine that you have had done.  Also try to get [place] a copy of your last tetanus shot    Cancer  screenings: You are due for a mammogram  Please call to schedule your mammogram.   [facility]  [phone] [address], [place], [place] [address]  According to the [place] preventative services task force (USPSTF) you no longer need Pap smears  According to the [place] preventative services task force (USPSTF), it is recommended to have a colonoscopy for cancer screen up to age 56.  Please try and get me a copy of your last colonoscopy   Eye Care See an eye doctor yearly and asked him to get [place] a copy of your report  [place], [place], [place] [address] Phone: [phone] [email or web address]   [facility] Dr. Camillo Flaming [address], [place], [place] [address]  [email or web address]   Fabio Pierce, M.D. Corena Herter, O.D. [address] [place], [place] [address] Medical telephone: [phone] Optical telephone: [phone]   Dr. Webb Laws [phone]  [address], [place], [place] [address] [phone]    Dental Care DENTIST RECOMMENDATION Dr. David Civils, dentist [address], [place],  [address] [phone] [email or web address]    Heart evaluation Follow-up with the cardiologist soon as planned with Dr. Crenshaw     Other significant issues  Thyroid nodules-we will schedule you for an updated ultrasound of your thyroid     I recommend a yearly ophthalmology/optometry visit for glaucoma screening and eye checkup  I recommended a yearly dental visit for hygiene and checkup  Advanced directives - discussed nature and purpose of Advanced Directives, encouraged them to complete them if they have not done so and/or encouraged them to get us a copy if they have done this already.  Diabetes   Check your feet daily for wounds or sores not  healing  Avoid going barefooted  See your eye doctor every year for diabetic eye screening  Check your blood sugars every day fasting in the morning and write these numbers down  Continue glyburide just recently added in [place]  Continue Janumet, but increase to twice daily  I recommend an updated blood pressure screening in your legs called ABI.  This is a screen for vascular disease for diabetics.  If agreeable we can schedule this.  Hypothyroidism  Continue levothyroxine 75 mcg daily first thing the morning on empty stomach, 1 hour before any other medications  High blood pressure   Continue diltiazem 180 mg daily  Continue lisinopril 30 mg daily, recently increased in [place]  Restart Carvedilol 25 mg, taper up as we discussed -1/4 tablet twice daily for 3 days, 1/2 tablet twice daily for 3 days, then increase to 1 whole tablet twice daily  Follow-up with cardiology soon as planned  High cholesterol   Zetia is not working  I recommend we add a statin such as Livalo or Pravachol If you are not willing to do this given prior problems with statins such as Crestor and Lipitor, we may need to try a different medicine such as Praluent which is an injection every other week  Recommendations for improving lipids:  Foods TO AVOID or limit - fried foods, high sugar foods, white bread, enriched flour, fast food, red meat, large amounts of cheese, processed foods such as little debbie cakes, cookies, pies, donuts, for example  Foods TO INCLUDE in the diet - whole grains such as whole grain pasta, whole grain bread, barley, steel cut oatmeal (not instant oatmeal), avocado, fish, green leafy vegetables, nuts, increased fiber in diet, and using olive oil in small amounts for cooking or as salad dressing vinaigrette.    Obesity and fatty liver disease - work on eating a healthy low fat diet and getting exercise to try and lose weight  Sleep apnea -I recommend you either restart CPAP,  try different type of CPAP mask or repeat sleep study if you no longer have CPAP device at home  Chronic neck and back pain, trigger finger-we will refer you to orthopedist here locally  For now continue your current medications including Cymbalta, gabapentin, and muscle relaxer Robaxin  History of stroke  You should be on a statin or other medicine to more effectively lower your cholesterol  Continue Eliquis twice daily [place] was seen today for medicare wellness.  Diagnoses and all orders for this visit:  Encounter for health maintenance examination in adult -     US THYROID; Future -     MM DIGITAL SCREENING BILATERAL; Future -       Ambulatory referral to Orthopedic Surgery -     EKG 12-Lead  Medicare annual wellness visit, subsequent  Thyroid nodule -     US THYROID; Future  Essential hypertension, benign -     EKG 12-Lead  OSA (obstructive sleep apnea)  Fatty liver  Type 2 diabetes mellitus with other specified complication, without long-term current use of insulin (HCC) -     EKG 12-Lead  Hyperlipidemia, unspecified hyperlipidemia type  High risk medication use  History of TIA (transient ischemic attack)  Chronic bilateral low back pain with sciatica, sciatica laterality unspecified  Chronic neck pain  Elevated LFTs  Snoring  Muscle spasm  Trigger index finger of right hand -     Ambulatory referral to Orthopedic Surgery  Encounter for screening mammogram for malignant neoplasm of breast -     MM DIGITAL SCREENING BILATERAL; Future  Hypothyroidism, unspecified type  Bruit  Encounter for screening for vascular disease  Chest discomfort  Other orders -     ALPRAZolam (XANAX) 0.25 MG tablet; Take 1 tablet (0.25 mg total) by mouth 2 (two) times daily as needed for anxiety.    Medicare Attestation A preventative services visit was completed today.  During the course of the visit the patient was educated and counseled about appropriate  screening and preventive services.  A health risk assessment was established with the patient that included a review of current medications, allergies, social history, family history, medical and preventative health history, biometrics, and preventative screenings to identify potential safety concerns or impairments.  A personalized plan was printed today for the patient's records and use.   Personalized health advice and education was given today to reduce health risks and promote self management and wellness.  Information regarding end of life planning was discussed today.  Shane Tysinger, PA-C   05/03/2019    

## 2019-05-02 NOTE — Patient Instructions (Addendum)
Recommendations:   Vaccines: Typical vaccines none at this stage would include Shingrix shingles vaccine, yearly flu shot, 2 separate pneumonia vaccines, and tetanus vaccine every 10 years  You recently had your Covid vaccine.  Get your flu shot in the fall such as September  Shingles vaccine:  I recommend you have a shingles vaccine to help prevent shingles or herpes zoster outbreak.   Please call your insurer to inquire about coverage for the Shingrix vaccine given in 2 doses.   Some insurers cover this vaccine after age 106, some cover this after age 84.  If your insurer covers this, then call to schedule appointment to have this vaccine here.  Please try to get [place] a copy of your pneumonia vaccine that you have had done.  Also try to get [place] a copy of your last tetanus shot    Cancer screenings: You are due for a mammogram  Please call to schedule your mammogram.   [facility]  [id number] [address] [place], [place] [address]  According to the [place] preventative services task force (USPSTF) you no longer need Pap smears  According to the [place] preventative services task force (USPSTF), it is recommended to have a colonoscopy for cancer screen up to age 42.  Please try and get me a copy of your last colonoscopy   Eye Care See an eye doctor yearly and asked him to get [place] a copy of your report  [place], [place], [place] [address] Phone: [phone] [email or web address]   [facility] Dr. Camillo Flaming [address] [place], [place] [address]  [email or web address]   Fabio Pierce, M.D. Corena Herter, O.D. [place], [place], [place] [address] Medical telephone: [phone] Optical telephone: [phone]   Dr. Webb Laws [address], [place], [place]  [phone]    Dental Care DENTIST RECOMMENDATION Dr. Jonna Coup, dentist [address], [place], [place] [address] [phone] [email or web address]    Heart evaluation Follow-up with the cardiologist soon as planned with Dr. Stanford Breed     Other significant issues  Thyroid nodules-we will schedule you for an updated ultrasound of your thyroid     I recommend a yearly ophthalmology/optometry visit for glaucoma screening and eye checkup  I recommended a yearly dental visit for hygiene and checkup  Advanced directives - discussed nature and purpose of Advanced Directives, encouraged them to complete them if they have not done so and/or encouraged them to get [place] a copy if they have done this already.  Diabetes   Check your feet daily for wounds or sores not healing  Avoid going barefooted  See your eye doctor every year for diabetic eye screening  Check your blood sugars every day fasting in the morning and write these numbers down  Continue glyburide just recently added in [place], but increase to twice daily  I recommend an updated blood pressure screening in your legs called ABI.  This is a screen for vascular disease for diabetics.  If agreeable we can schedule this.  Hypothyroidism  Continue levothyroxine 75 mcg daily first thing the morning on empty stomach, 1 hour before any other medications  High blood pressure   Continue diltiazem 180 mg daily  Continue lisinopril 30 mg daily, recently increased in [place] Carvedilol 25 mg, taper up as we discussed -1/4 tablet  twice daily for 3 days, 1/2 tablet twice daily for 3 days, then increase to 1 whole tablet twice daily  Follow-up with cardiology soon as planned  High cholesterol   Zetia is not working  I recommend we add a statin such as Livalo or Pravachol If you are not willing to do this given prior problems with statins such as Crestor and Lipitor, we may need to  try a different medicine such as Praluent which is an injection every other week  Recommendations for improving lipids:  Foods TO AVOID or limit - fried foods, high sugar foods, white bread, enriched flour, fast food, red meat, large amounts of cheese, processed foods such as little debbie cakes, cookies, pies, donuts, for example  Foods TO INCLUDE in the diet - whole grains such as whole grain pasta, whole grain bread, barley, steel cut oatmeal (not instant oatmeal), avocado, fish, green leafy vegetables, nuts, increased fiber in diet, and using olive oil in small amounts for cooking or as salad dressing vinaigrette.    Obesity and fatty liver disease - work on eating a healthy low fat diet and getting exercise to try and lose weight  Sleep apnea -I recommend you either restart CPAP, try different type of CPAP mask or repeat sleep study if you no longer have CPAP device at home  Chronic neck and back pain, trigger finger-we will refer you to orthopedist here locally  For now continue your current medications including Cymbalta, gabapentin, and muscle relaxer Robaxin  History of stroke  You should be on a statin or other medicine to more effectively lower your cholesterol  Continue Eliquis twice daily

## 2019-05-03 ENCOUNTER — Encounter: Payer: Self-pay | Admitting: Medical

## 2019-05-03 DIAGNOSIS — R0789 Other chest pain: Secondary | ICD-10-CM | POA: Insufficient documentation

## 2019-05-03 NOTE — Addendum Note (Signed)
Addended by: [name] on: 05/03/2019 07:34 AM   Modules accepted: Orders

## 2019-05-04 ENCOUNTER — Encounter: Payer: Self-pay | Admitting: Medical

## 2019-05-05 ENCOUNTER — Other Ambulatory Visit: Payer: Self-pay | Admitting: Medical

## 2019-05-07 ENCOUNTER — Telehealth: Payer: Self-pay

## 2019-05-07 NOTE — Telephone Encounter (Signed)
Received a fax from [facility] for refills on the pts. Duloxetine, Diltazem, carvedilol, accu check soft clix lancets, accu chek guide L1-L2 CTRL SOL pt. Last apt 05/02/19

## 2019-05-08 ENCOUNTER — Telehealth: Payer: Self-pay

## 2019-05-08 ENCOUNTER — Other Ambulatory Visit: Payer: Self-pay | Admitting: Medical

## 2019-05-08 ENCOUNTER — Other Ambulatory Visit: Payer: Self-pay

## 2019-05-08 ENCOUNTER — Telehealth: Payer: Self-pay | Admitting: Medical

## 2019-05-08 DIAGNOSIS — Z0000 Encounter for general adult medical examination without abnormal findings: Secondary | ICD-10-CM

## 2019-05-08 DIAGNOSIS — Z1231 Encounter for screening mammogram for malignant neoplasm of breast: Secondary | ICD-10-CM

## 2019-05-08 MED ORDER — SITAGLIPTIN PHOS-METFORMIN HCL 50-1000 MG PO TABS: 1.0000 | ORAL_TABLET | Freq: Two times a day (BID) | ORAL | 0 refills | Status: DC

## 2019-05-08 MED ORDER — GLYBURIDE 1.25 MG PO TABS: 1.2500 mg | ORAL_TABLET | Freq: Every morning | ORAL | 0 refills | Status: DC

## 2019-05-08 MED ORDER — LEVOTHYROXINE SODIUM 75 MCG PO TABS: 75.0000 ug | ORAL_TABLET | Freq: Every day | ORAL | 0 refills | Status: DC

## 2019-05-08 NOTE — Telephone Encounter (Signed)
Received a fax from [facility] for a refill on the pts. Glyburide, Janumet XR, and Levothyroxine pt. lasta pt was 05/02/19.

## 2019-05-08 NOTE — Telephone Encounter (Signed)
Please send 90 day supply on the requested meds

## 2019-05-08 NOTE — Telephone Encounter (Signed)
Fax from [facility]  Asking for new rx diltiazem

## 2019-05-08 NOTE — Telephone Encounter (Signed)
Medication has been sent.  

## 2019-05-09 ENCOUNTER — Other Ambulatory Visit: Payer: Self-pay

## 2019-05-09 MED ORDER — ACCU-CHEK GUIDE VI STRP: ORAL_STRIP | 2 refills | Status: DC

## 2019-05-09 MED ORDER — DILTIAZEM HCL ER 180 MG PO CP24: 180.0000 mg | ORAL_CAPSULE | Freq: Every day | ORAL | 0 refills | Status: DC

## 2019-05-09 MED ORDER — DULOXETINE HCL 60 MG PO CPEP: 60.0000 mg | ORAL_CAPSULE | Freq: Every day | ORAL | 0 refills | Status: DC

## 2019-05-09 MED ORDER — CARVEDILOL 25 MG PO TABS: 25.0000 mg | ORAL_TABLET | Freq: Two times a day (BID) | ORAL | 0 refills | Status: DC

## 2019-05-09 MED ORDER — ACCU-CHEK GUIDE ME W/DEVICE KIT: PACK | 0 refills | Status: DC

## 2019-05-09 NOTE — Telephone Encounter (Signed)
Done

## 2019-05-09 NOTE — Telephone Encounter (Signed)
Please declined the prescription.  I do not show that active in her chart and I do not normally prescribe that medication anyhow

## 2019-05-10 ENCOUNTER — Ambulatory Visit (INDEPENDENT_AMBULATORY_CARE_PROVIDER_SITE_OTHER): Payer: Medicare HMO

## 2019-05-10 ENCOUNTER — Ambulatory Visit: Payer: Medicare HMO | Admitting: Physician Assistant

## 2019-05-10 ENCOUNTER — Other Ambulatory Visit: Payer: Self-pay

## 2019-05-10 ENCOUNTER — Encounter: Payer: Self-pay | Admitting: Physician Assistant

## 2019-05-10 DIAGNOSIS — M65321 Trigger finger, right index finger: Secondary | ICD-10-CM

## 2019-05-10 NOTE — Progress Notes (Signed)
Office Visit Note   Patient: Sandra Ruiz           Date of Birth: 1945/08/28           MRN: [id number] Visit Date: 05/10/2019              Requested by: [name], PA-C [address] [place],  [place] [address] PCP: [name], PA-C   Assessment & Plan: Visit Diagnoses:  1. Trigger finger, right index finger     Plan: Discussed with patient options of repeat injection versus surgical intervention.  Surgical intervention would be trigger finger release.  She opted for repeat injection.  We will did discuss with her that she is to monitor her glucose levels closely over the next few days.  She will follow-up with [place] on as-needed basis.  Follow-Up Instructions: Return if symptoms worsen or fail to improve.   Orders:  Orders Placed This Encounter  Procedures  . XR Hand Complete Right   No orders of the defined types were placed in this encounter.     Procedures: No procedures performed   Clinical Data: No additional findings.   Subjective: Chief Complaint  Patient presents with  . Right Hand - Pain    HPI Patient is 74 year old female were seen for the first time comes in today with right index finger that is locking up.  She is recently moved from [place].  She states she gets injections periodically for the right index finger locking up last injection was a year and a half ago.  She has had no new injury to the hand.  She is diabetic reports her hemoglobin A1c to be around 7.  She had the [employer] Covid vaccine injection more than 2 weeks ago.  Review of Systems See HPI  Objective: Vital Signs: There were no vitals taken for this visit.  Physical Exam Constitutional:      Appearance: She is not ill-appearing or diaphoretic.  Pulmonary:     Effort: Pulmonary effort is normal.  Neurological:     Mental Status: She is alert and oriented to person, place, and time.  Psychiatric:        Mood and Affect: Mood normal.      Ortho Exam Bilateral hands full range of motion.  Active triggering of the right index finger.  Tenderness over the A1 pulley with a palpable nodule.  No other triggering fingers either hand.  No rashes skin lesions ulcerations either hands. Specialty Comments:  No specialty comments available.  Imaging: XR Hand Complete Right  Result Date: 05/10/2019 Right hand 3 views: No acute fractures.  No significant arthritic changes.  No bony abnormalities.    PMFS History: Patient Active Problem List   Diagnosis Date Noted  . Chest discomfort 05/03/2019  . Encounter for screening for vascular disease 05/02/2019  . Thyroid nodule 05/02/2019  . Trigger index finger of right hand 05/02/2019  . Hypothyroidism 05/02/2019  . Bruit 05/02/2019  . Diabetes mellitus ([place]) 01/02/2019  . Hyperlipidemia 01/02/2019  . Chronic bilateral low back pain with sciatica 01/02/2019  . High risk medication use 01/02/2019  . History of TIA (transient ischemic attack) 01/02/2019  . Muscle spasm 01/02/2019  . Chronic neck pain 01/02/2019  . Essential hypertension, benign 01/02/2019  . Fatty liver 01/02/2019  . Elevated LFTs 01/02/2019  . Snoring 01/02/2019  . OSA (obstructive sleep apnea) 01/02/2019   Past Medical History:  Diagnosis Date  . Chronic pain   .  Depression   . Diabetes mellitus without complication ([place])   . GERD (gastroesophageal reflux disease)   . History of TIA (transient ischemic attack)   . Hyperlipidemia   . Hypertension   . Thyroid disease     History reviewed. No pertinent family history.  Past Surgical History:  Procedure Laterality Date  . CARDIAC CATHETERIZATION    . CERVICAL SPINE SURGERY    . LUMBAR SPINE SURGERY    . TONSILLECTOMY AND ADENOIDECTOMY     Social History   Occupational History  . Not on file  Tobacco Use  . Smoking status: Former Research scientist (life sciences)  . Smokeless tobacco: Never Used  Substance and Sexual Activity  . Alcohol use: Yes  . Drug use: Never  .  Sexual activity: Not on file

## 2019-05-14 ENCOUNTER — Ambulatory Visit
Admission: RE | Admit: 2019-05-14 | Discharge: 2019-05-14 | Disposition: A | Discharge status: Home / Self Care | Payer: Medicare HMO | Source: Ambulatory Visit | Attending: Medical | Admitting: Medical

## 2019-05-14 DIAGNOSIS — E041 Nontoxic single thyroid nodule: Secondary | ICD-10-CM

## 2019-05-14 DIAGNOSIS — Z0000 Encounter for general adult medical examination without abnormal findings: Secondary | ICD-10-CM

## 2019-05-14 DIAGNOSIS — E042 Nontoxic multinodular goiter: Secondary | ICD-10-CM | POA: Diagnosis not present

## 2019-05-25 ENCOUNTER — Telehealth: Payer: Self-pay

## 2019-05-25 ENCOUNTER — Other Ambulatory Visit: Payer: Self-pay | Admitting: Medical

## 2019-05-25 MED ORDER — FOLIC ACID 1 MG PO TABS: 1.0000 mg | ORAL_TABLET | Freq: Every day | ORAL | 1 refills | Status: DC

## 2019-05-25 NOTE — Progress Notes (Signed)
Referring-David Tysinger PA-C Reason for referral-Atrial fibrillation  HPI: 74 yo female for evaluation of atrial fibrillation at request of Chana Bode PA-C.  Laboratories March 2021 showed normal TSH, LDL 133, HDL 37, SGOT 53 and SGPT 50.  Patient recently moved to this area from [place].  She has a history of paroxysmal atrial fibrillation and has had previous ablation.  She states she was told she had a "hole in her heart" but it may have been related to the ablation.  No records are available.  She does have dyspnea on exertion but no orthopnea or PND.  Occasional mild pedal edema.  She has occasional chest pain that is substernal and described as a pressure.  No radiation.  There is diaphoresis but no dyspnea or nausea.  Typically last 15 minutes to 1 hour.  Not pleuritic.  She does not have exertional chest pain.  No history of syncope.  Cardiology now asked to evaluate.  Current Outpatient Medications  Medication Sig Dispense Refill  . ACCU-CHEK GUIDE test strip Test 1-2 times daily 100 each 2  . ALPRAZolam (XANAX) 0.25 MG tablet Take 1 tablet (0.25 mg total) by mouth 2 (two) times daily as needed for anxiety. 20 tablet 0  . apixaban (ELIQUIS) 5 MG TABS tablet Take 1 tablet (5 mg total) by mouth 2 (two) times daily. 180 tablet 1  . aspirin 81 MG EC tablet Take 81 mg by mouth daily. Swallow whole.    . Blood Glucose Monitoring Suppl (ACCU-CHEK GUIDE ME) w/Device KIT Test 1-2 times daily 1 kit 0  . carvedilol (COREG) 25 MG tablet Take 1 tablet (25 mg total) by mouth 2 (two) times daily with a meal. 180 tablet 0  . diltiazem (DILACOR XR) 180 MG 24 hr capsule Take 1 capsule (180 mg total) by mouth daily. 90 capsule 0  . DULoxetine (CYMBALTA) 60 MG capsule Take 1 capsule (60 mg total) by mouth daily. 90 capsule 0  . ezetimibe (ZETIA) 10 MG tablet Take 1 tablet (10 mg total) by mouth daily. 90 tablet 3  . folic acid (FOLVITE) 1 MG tablet Take 1 tablet (1 mg total) by mouth daily. 90  tablet 1  . gabapentin (NEURONTIN) 800 MG tablet Take 1 tablet (800 mg total) by mouth 3 (three) times daily. 270 tablet 1  . glyBURIDE (DIABETA) 1.25 MG tablet Take 1 tablet (1.25 mg total) by mouth every morning. 90 tablet 0  . levothyroxine (SYNTHROID) 75 MCG tablet Take 1 tablet (75 mcg total) by mouth daily before breakfast. 90 tablet 0  . lisinopril (ZESTRIL) 30 MG tablet Take 30 mg by mouth daily.    . methocarbamol (ROBAXIN) 500 MG tablet Take 1 tablet (500 mg total) by mouth 2 (two) times daily. 60 tablet 0  . omeprazole (PRILOSEC) 20 MG capsule TAKE 1 CAPSULE BY MOUTH EVERY DAY 90 capsule 1  . oxybutynin (DITROPAN-XL) 10 MG 24 hr tablet Take 10 mg by mouth 2 (two) times daily.    . sitaGLIPtin-metformin (JANUMET) 50-1000 MG tablet Take 1 tablet by mouth 2 (two) times daily with a meal. 180 tablet 0  . pravastatin (PRAVACHOL) 40 MG tablet Take 1 tablet (40 mg total) by mouth every evening. 90 tablet 3   No current facility-administered medications for this visit.    Allergies  Allergen Reactions  . Baclofen Swelling  . Crestor [Rosuvastatin]     Extreme myalgias  . Lipitor [Atorvastatin]     Extreme myalgias  . Verapamil Swelling  Past Medical History:  Diagnosis Date  . Atrial fibrillation ([place])   . Chronic pain   . Depression   . Diabetes mellitus without complication ([place])   . GERD (gastroesophageal reflux disease)   . History of TIA (transient ischemic attack)   . Hyperlipidemia   . Hypertension   . Thyroid disease     Past Surgical History:  Procedure Laterality Date  . ATRIAL FIBRILLATION ABLATION    . CARDIAC CATHETERIZATION    . CERVICAL SPINE SURGERY    . LUMBAR SPINE SURGERY    . TONSILLECTOMY AND ADENOIDECTOMY      Social History   Socioeconomic History  . Marital status: Married    Spouse name: Not on file  . Number of children: Not on file  . Years of education: Not on file  . Highest education level: Not on file  Occupational History    . Not on file  Tobacco Use  . Smoking status: Former Research scientist (life sciences)  . Smokeless tobacco: Never Used  Substance and Sexual Activity  . Alcohol use: Yes  . Drug use: Never  . Sexual activity: Not on file  Other Topics Concern  . Not on file  Social History Narrative   From [place], married, [place] Witness, new from [place] to [facility] 12/2018   Social Determinants of Health   Financial Resource Strain:   . Difficulty of Paying Living Expenses:   Food Insecurity:   . Worried About Charity fundraiser in the Last Year:   . Arboriculturist in the Last Year:   Transportation Needs:   . Film/video editor (Medical):   Marland Kitchen Lack of Transportation (Non-Medical):   Physical Activity:   . Days of Exercise per Week:   . Minutes of Exercise per Session:   Stress:   . Feeling of Stress :   Social Connections:   . Frequency of Communication with Friends and Family:   . Frequency of Social Gatherings with Friends and Family:   . Attends Religious Services:   . Active Member of Clubs or Organizations:   . Attends Archivist Meetings:   Marland Kitchen Marital Status:   Intimate Partner Violence:   . Fear of Current or Ex-Partner:   . Emotionally Abused:   Marland Kitchen Physically Abused:   . Sexually Abused:     Family History  Problem Relation Age of Onset  . CAD Mother   . CAD Father     ROS: Back pain but no fevers or chills, productive cough, hemoptysis, dysphasia, odynophagia, melena, hematochezia, dysuria, hematuria, rash, seizure activity, orthopnea, PND, pedal edema, claudication. Remaining systems are negative.  Physical Exam:   Blood pressure 134/72, pulse 82, height 5' 1"  (1.549 m), weight 217 lb (98.4 kg), SpO2 97 %.  General:  Well developed/obese in NAD Skin warm/dry Patient not depressed No peripheral clubbing Back-normal HEENT-normal/normal eyelids Neck supple/normal carotid upstroke bilaterally; no bruits; no JVD; no thyromegaly chest - CTA/ normal  expansion CV - RRR/normal S1 and S2; no murmurs, rubs or gallops;  PMI nondisplaced Abdomen -NT/ND, no HSM, no mass, + bowel sounds, no bruit 2+ femoral pulses, no bruits Ext-no edema, chords, 2+ DP Neuro-grossly nonfocal  ECG - 05/02/19-NSR, no ST changes; personally reviewed  A/P  1 paroxysmal atrial fibrillation-patient is status post ablation.  We will obtain previous records.  She will continue on apixaban at present dose.  2 Hypertension - BP controlled; continue present meds and follow.  3 chest pain-symptoms  somewhat atypical.  We will arrange a [place] nuclear study for risk stratification.  Patient does have greater than 20-year history of diabetes mellitus.  4 hyperlipidemia-she did not tolerate Crestor or Lipitor in the past.  She will continue Zetia.  I will add pravastatin 40 mg daily.  In 12 weeks check lipids and liver.  If she does not tolerate pravastatin she may require Repatha.  5 question hole in heart-we will obtain outside records.  Repeat echocardiogram.  No murmur on examination.  6 diabetes mellitus-Per primary care. Kirk Ruths, MD

## 2019-05-25 NOTE — Telephone Encounter (Signed)
I received a fax from [facility] for a refill on the pts. Folic Acid 1mg  tab pt. Last apt was 05/02/19

## 2019-05-25 NOTE — Telephone Encounter (Signed)
Is this appropriate to send?

## 2019-05-28 ENCOUNTER — Telehealth: Payer: Self-pay | Admitting: Medical

## 2019-05-28 NOTE — Telephone Encounter (Signed)
Pharmacy sent refill request for ELIQUIS 5MG  EZETIMIBE 10 MG GABEPENTIN 800 MG PLEASE SEND TO [place], [place]

## 2019-05-29 ENCOUNTER — Other Ambulatory Visit: Payer: Self-pay | Admitting: Medical

## 2019-05-29 MED ORDER — APIXABAN 5 MG PO TABS: 5.0000 mg | ORAL_TABLET | Freq: Two times a day (BID) | ORAL | 1 refills | Status: DC

## 2019-05-29 MED ORDER — EZETIMIBE 10 MG PO TABS: 10.0000 mg | ORAL_TABLET | Freq: Every day | ORAL | 3 refills | Status: DC

## 2019-05-29 MED ORDER — GABAPENTIN 800 MG PO TABS: 800.0000 mg | ORAL_TABLET | Freq: Three times a day (TID) | ORAL | 1 refills | Status: DC

## 2019-05-29 NOTE — Telephone Encounter (Signed)
done

## 2019-05-30 ENCOUNTER — Encounter: Payer: Self-pay | Admitting: *Deleted

## 2019-05-30 ENCOUNTER — Ambulatory Visit: Payer: Medicare HMO | Admitting: Cardiology

## 2019-05-30 ENCOUNTER — Encounter: Payer: Self-pay | Admitting: Medical

## 2019-05-30 ENCOUNTER — Encounter: Payer: Self-pay | Admitting: Cardiology

## 2019-05-30 ENCOUNTER — Other Ambulatory Visit: Payer: Self-pay

## 2019-05-30 VITALS — BP 134/72 | HR 82 | Ht 61.0 in | Wt 217.0 lb

## 2019-05-30 DIAGNOSIS — I10 Essential (primary) hypertension: Secondary | ICD-10-CM

## 2019-05-30 DIAGNOSIS — E7800 Pure hypercholesterolemia, unspecified: Secondary | ICD-10-CM

## 2019-05-30 DIAGNOSIS — I480 Paroxysmal atrial fibrillation: Secondary | ICD-10-CM

## 2019-05-30 DIAGNOSIS — Z789 Other specified health status: Secondary | ICD-10-CM | POA: Insufficient documentation

## 2019-05-30 DIAGNOSIS — R072 Precordial pain: Secondary | ICD-10-CM

## 2019-05-30 DIAGNOSIS — E119 Type 2 diabetes mellitus without complications: Secondary | ICD-10-CM | POA: Diagnosis not present

## 2019-05-30 MED ORDER — PRAVASTATIN SODIUM 40 MG PO TABS
40.0000 mg | ORAL_TABLET | Freq: Every evening | ORAL | 3 refills | Status: DC
Start: 2019-05-30 — End: 2020-02-04

## 2019-05-30 NOTE — Patient Instructions (Signed)
Medication Instructions:   START PRAVASTATIN 40 MG ONCE DAILY  *If you need a refill on your cardiac medications before your next appointment, please call your pharmacy*   Lab Work:  Your physician recommends that you return for lab work in: [place]  If you have labs (blood work) drawn today and your tests are completely normal, you will receive your results only by: Marland Kitchen MyChart Message (if you have MyChart) OR . A paper copy in the mail If you have any lab test that is abnormal or we need to change your treatment, we will call you to review the results.   Testing/Procedures:  Your physician has requested that you have a lexiscan myoview. For further information please visit [email or web address]. Please follow instruction sheet, as given.[place] has requested that you have an echocardiogram. Echocardiography is a painless test that uses sound waves to create images of your heart. It provides your doctor with information about the size and shape of your heart and how well your heart's chambers and valves are working. This procedure takes approximately one hour. There are no restrictions for this procedure.[place]     Follow-Up: At [facility], you and your health needs are our priority.  As part of our continuing mission to provide you with exceptional heart care, we have created designated Provider Care Teams.  These Care Teams include your primary Cardiologist (physician) and Advanced Practice Providers (APPs -  Physician Assistants and Nurse Practitioners) who all work together to provide you with the care you need, when you need it.  We recommend signing up for the patient portal called "MyChart".  Sign up information is provided on this After Visit Summary.  MyChart is used to connect with patients for Virtual Visits (Telemedicine).  Patients are able to view lab/test results, encounter notes, upcoming appointments,  etc.  Non-urgent messages can be sent to your provider as well.   To learn more about what you can do with MyChart, go to [email or web address].    Your next appointment:   6 month(s)  The format for your next appointment:   In Person  Provider:   Kirk Ruths, MD

## 2019-05-31 ENCOUNTER — Telehealth: Payer: Self-pay

## 2019-05-31 NOTE — Telephone Encounter (Signed)
Received fax from [facility] for a refill on the pts. accu-chek fastclix lancets pt. Last apt. Was 05/02/19.

## 2019-06-06 ENCOUNTER — Telehealth: Payer: Self-pay | Admitting: Family Medicine

## 2019-06-06 NOTE — Telephone Encounter (Signed)
Medication has already been cleared. Patient wanted to make sure her medication went to [facility].

## 2019-06-06 NOTE — Telephone Encounter (Signed)
Pt left message she is still having medication problem.  Please call pt [phone]

## 2019-06-06 NOTE — Telephone Encounter (Signed)
Fax was sent back

## 2019-06-06 NOTE — Telephone Encounter (Signed)
Call and find out what the issue is

## 2019-06-19 ENCOUNTER — Telehealth (HOSPITAL_COMMUNITY): Payer: Self-pay

## 2019-06-19 NOTE — Telephone Encounter (Signed)
Detailed instructions were left on the patient's answering machine. Asked to call back with any questions. S.Cherye Gaertner EMTP

## 2019-06-20 ENCOUNTER — Telehealth: Payer: Self-pay | Admitting: Family Medicine

## 2019-06-20 NOTE — Telephone Encounter (Signed)
Dt  

## 2019-06-21 ENCOUNTER — Other Ambulatory Visit: Payer: Self-pay

## 2019-06-21 ENCOUNTER — Ambulatory Visit (HOSPITAL_COMMUNITY): Payer: Medicare HMO | Attending: Cardiovascular Disease

## 2019-06-21 ENCOUNTER — Ambulatory Visit (HOSPITAL_BASED_OUTPATIENT_CLINIC_OR_DEPARTMENT_OTHER): Payer: Medicare HMO

## 2019-06-21 DIAGNOSIS — R072 Precordial pain: Secondary | ICD-10-CM

## 2019-06-21 MED ORDER — TECHNETIUM TC 99M TETROFOSMIN IV KIT
32.9000 | PACK | Freq: Once | INTRAVENOUS | Status: AC | PRN
  Administered 2019-06-21: 32.9 via INTRAVENOUS
  Filled 2019-06-21: qty 33

## 2019-06-21 MED ORDER — REGADENOSON 0.4 MG/5ML IV SOLN
0.4000 mg | Freq: Once | INTRAVENOUS | Status: AC
Start: 2019-06-21 — End: 2019-06-21
  Administered 2019-06-21: 0.4 mg via INTRAVENOUS

## 2019-06-22 ENCOUNTER — Telehealth: Payer: Self-pay | Admitting: Cardiology

## 2019-06-22 ENCOUNTER — Ambulatory Visit (HOSPITAL_COMMUNITY): Payer: Medicare HMO | Attending: Cardiovascular Disease

## 2019-06-22 LAB — MYOCARDIAL PERFUSION IMAGING
LV dias vol: 57 mL (ref 46–106)
LV sys vol: 16 mL
Peak HR: 93 {beats}/min
Rest HR: 75 {beats}/min
SDS: 0
SRS: 0
SSS: 0
TID: 1.03

## 2019-06-22 MED ORDER — TECHNETIUM TC 99M TETROFOSMIN IV KIT
30.5000 | PACK | Freq: Once | INTRAVENOUS | Status: AC | PRN
  Administered 2019-06-22: 30.5 via INTRAVENOUS
  Filled 2019-06-22: qty 31

## 2019-06-22 NOTE — Telephone Encounter (Signed)
Patient is returning call to discuss results from echocardiogram completed on 06/21/19. Please call.

## 2019-06-22 NOTE — Telephone Encounter (Signed)
Called and reviewed echo results with pt. Verbalized understanding. Pt states she is at her stress test currently and that the dye she was given has caused "extreme heartburn". Notified pt to let the nurse there know she was having the heart burn. Explained that the medication they give is to increase the heart rate and it can have strong effects. Pt verbalized understanding and had no other questions at this time.

## 2019-06-25 ENCOUNTER — Telehealth: Payer: Self-pay | Admitting: Emergency Medicine

## 2019-06-25 NOTE — Telephone Encounter (Signed)
Transferred call to [facility] regarding results.

## 2019-07-10 ENCOUNTER — Other Ambulatory Visit: Payer: Self-pay | Admitting: Medical

## 2019-07-10 ENCOUNTER — Telehealth: Payer: Self-pay

## 2019-07-10 MED ORDER — SITAGLIPTIN PHOS-METFORMIN HCL 50-1000 MG PO TABS: 1.0000 | ORAL_TABLET | Freq: Two times a day (BID) | ORAL | 1 refills | Status: DC

## 2019-07-10 MED ORDER — GLYBURIDE 1.25 MG PO TABS: 1.2500 mg | ORAL_TABLET | Freq: Every morning | ORAL | 3 refills | Status: DC

## 2019-07-10 MED ORDER — OXYBUTYNIN CHLORIDE ER 10 MG PO TB24: 10.0000 mg | ORAL_TABLET | Freq: Two times a day (BID) | ORAL | 3 refills | Status: DC

## 2019-07-10 MED ORDER — DULOXETINE HCL 60 MG PO CPEP: 60.0000 mg | ORAL_CAPSULE | Freq: Every day | ORAL | 1 refills | Status: DC

## 2019-07-10 MED ORDER — DILTIAZEM HCL ER 180 MG PO CP24: 180.0000 mg | ORAL_CAPSULE | Freq: Every day | ORAL | 3 refills | Status: DC

## 2019-07-10 NOTE — Telephone Encounter (Signed)
Received a fax from [facility] for a refill on the pts. Duloxetine, janumet, diltazem XR, and oxybutynin, pt. Last apt was 05/02/19 and next apt is 08/01/19.

## 2019-07-19 ENCOUNTER — Other Ambulatory Visit: Payer: Self-pay | Admitting: Medical

## 2019-07-19 ENCOUNTER — Telehealth: Payer: Self-pay | Admitting: Medical

## 2019-07-19 MED ORDER — LISINOPRIL 30 MG PO TABS: 30.0000 mg | ORAL_TABLET | Freq: Every day | ORAL | 3 refills | Status: DC

## 2019-07-19 MED ORDER — METHOCARBAMOL 500 MG PO TABS: 500.0000 mg | ORAL_TABLET | Freq: Two times a day (BID) | ORAL | 0 refills | Status: DC

## 2019-07-19 NOTE — Telephone Encounter (Signed)
done

## 2019-07-19 NOTE — Telephone Encounter (Signed)
Received a call from [facility]. They requested refills for on lisinopril and methocarbamol.

## 2019-08-01 ENCOUNTER — Encounter: Payer: Self-pay | Admitting: Medical

## 2019-08-01 ENCOUNTER — Ambulatory Visit (INDEPENDENT_AMBULATORY_CARE_PROVIDER_SITE_OTHER): Payer: Medicare HMO | Admitting: Medical

## 2019-08-01 ENCOUNTER — Other Ambulatory Visit: Payer: Self-pay

## 2019-08-01 VITALS — BP 114/62 | HR 80 | Ht 61.25 in | Wt 220.6 lb

## 2019-08-01 DIAGNOSIS — E1159 Type 2 diabetes mellitus with other circulatory complications: Secondary | ICD-10-CM

## 2019-08-01 DIAGNOSIS — Z789 Other specified health status: Secondary | ICD-10-CM

## 2019-08-01 DIAGNOSIS — E785 Hyperlipidemia, unspecified: Secondary | ICD-10-CM

## 2019-08-01 DIAGNOSIS — E039 Hypothyroidism, unspecified: Secondary | ICD-10-CM | POA: Diagnosis not present

## 2019-08-01 DIAGNOSIS — I152 Hypertension secondary to endocrine disorders: Secondary | ICD-10-CM

## 2019-08-01 DIAGNOSIS — M79645 Pain in left finger(s): Secondary | ICD-10-CM | POA: Insufficient documentation

## 2019-08-01 DIAGNOSIS — W19XXXA Unspecified fall, initial encounter: Secondary | ICD-10-CM | POA: Diagnosis not present

## 2019-08-01 DIAGNOSIS — I10 Essential (primary) hypertension: Secondary | ICD-10-CM

## 2019-08-01 DIAGNOSIS — Z79899 Other long term (current) drug therapy: Secondary | ICD-10-CM

## 2019-08-01 DIAGNOSIS — F321 Major depressive disorder, single episode, moderate: Secondary | ICD-10-CM | POA: Insufficient documentation

## 2019-08-01 DIAGNOSIS — E1169 Type 2 diabetes mellitus with other specified complication: Secondary | ICD-10-CM

## 2019-08-01 DIAGNOSIS — H6123 Impacted cerumen, bilateral: Secondary | ICD-10-CM | POA: Diagnosis not present

## 2019-08-01 DIAGNOSIS — M5440 Lumbago with sciatica, unspecified side: Secondary | ICD-10-CM

## 2019-08-01 DIAGNOSIS — M25532 Pain in left wrist: Secondary | ICD-10-CM | POA: Diagnosis not present

## 2019-08-01 DIAGNOSIS — M62838 Other muscle spasm: Secondary | ICD-10-CM

## 2019-08-01 DIAGNOSIS — M65312 Trigger thumb, left thumb: Secondary | ICD-10-CM | POA: Diagnosis not present

## 2019-08-01 DIAGNOSIS — K760 Fatty (change of) liver, not elsewhere classified: Secondary | ICD-10-CM | POA: Diagnosis not present

## 2019-08-01 DIAGNOSIS — M79672 Pain in left foot: Secondary | ICD-10-CM | POA: Diagnosis not present

## 2019-08-01 DIAGNOSIS — S90122A Contusion of left lesser toe(s) without damage to nail, initial encounter: Secondary | ICD-10-CM | POA: Insufficient documentation

## 2019-08-01 DIAGNOSIS — R0683 Snoring: Secondary | ICD-10-CM

## 2019-08-01 DIAGNOSIS — G8929 Other chronic pain: Secondary | ICD-10-CM

## 2019-08-01 DIAGNOSIS — Z8673 Personal history of transient ischemic attack (TIA), and cerebral infarction without residual deficits: Secondary | ICD-10-CM

## 2019-08-01 DIAGNOSIS — M542 Cervicalgia: Secondary | ICD-10-CM

## 2019-08-01 DIAGNOSIS — R7989 Other specified abnormal findings of blood chemistry: Secondary | ICD-10-CM

## 2019-08-01 NOTE — Patient Instructions (Signed)
Encounter Diagnoses  Name Primary?  . Trigger finger of left thumb Yes  . Pain of left thumb   . Left wrist pain   . Left foot pain   . Contusion of toe of left foot, unspecified toe, initial encounter   . Fall, initial encounter     Recommendations  Please go to [facility] for your thumb xray.   Their hours are 8am - 4:30 pm Monday - Friday.  Take your insurance card with you.  [facility] [phone]  [place] [address], [place], [place] [address]  [address], [place] [address]   Use a thumb spica splint daily for 2 weeks.   Rest the thumb and hand , avoid re injury to hand or foot  You can alternate cool therapy and hot therapy, for example cool bag of ice water or bag of frozen peas.   Warm moist towel for heat therapy.   alternate 20 minutes each.  If the trigger thumb doesn't resolve in 2 weeks, you may ultimately need to see orthopedics

## 2019-08-01 NOTE — Progress Notes (Addendum)
Subjective: Chief Complaint  Patient presents with  . Follow-up  . Fall    fell out of bed and hurt left hand and left foot    Here for fall.      She notes a week ago she fell out of bed, hurt her hand and neck.  Was dreaming in deep sleep, and awoke on the floor.  Husband came over to help.  Later that day felt pain in left thumb and wrist, left foot and toes.  Thinks she kicked [employer] on the way down. Had bruised and swollen 1st and 2nd toe on left.  Has chronic pain in general, no doesn't attribute any other pain from the fall.    History of atrial fibrillation - She is compliant with Eliquis and aspirin daily  Hypertension- She recently saw cardiology for consult.  She is compliant with carvedilol 25 mg twice daily, diltiazem XR 180 mg daily.  Hyperlipidemia, history of TIA -she is compliant with Zetia 10 mg daily and recently has started on Pravachol per cardiology.  So far she is tolerating this for the most part although she has had some aches in her legs.  She has not tolerated other statins in the past.  Diabetes-compliant with glyburide 1.25 mg daily, sitagliptin Metformin 50/1000 mg twice daily.  No recent foot lesions, no recent problems with hyperglycemia or hypoglycemia  Hypothyroidism-compliant with Synthroid 75 mcg daily  Chronic back pain, neuropathy -uses Robaxin twice daily,  She is taking gabapentin 800 mg 3 times daily  Chronic pain and depression - she takes Cymbalta 60 mg daily.  She did counseling back to [place].  Lately her mood has been more down.  She misses her old friends but is considering [place].  She is frustrated with getting older and her health concerns, no HI.  She feels water sensation in her ears.  No recent swimming.  She would like to get back in the pool but feels embarrassed to go swimming due to her weight  Obesity-she is frustrated with her weight.  She has chronic pain which limits her exercise  No other aggravating or relieving  factors.    No other c/o.  Past Medical History:  Diagnosis Date  . Atrial fibrillation ([place])   . Chronic pain   . Depression   . Diabetes mellitus without complication ([place])   . GERD (gastroesophageal reflux disease)   . History of TIA (transient ischemic attack)   . Hyperlipidemia   . Hypertension   . Thyroid disease    Current Outpatient Medications on File Prior to Visit  Medication Sig Dispense Refill  . ACCU-CHEK GUIDE test strip Test 1-2 times daily 100 each 2  . Accu-Chek Softclix Lancets lancets     . apixaban (ELIQUIS) 5 MG TABS tablet Take 1 tablet (5 mg total) by mouth 2 (two) times daily. 180 tablet 1  . aspirin 81 MG EC tablet Take 81 mg by mouth daily. Swallow whole.    . Blood Glucose Monitoring Suppl (ACCU-CHEK GUIDE ME) w/Device KIT Test 1-2 times daily 1 kit 0  . carvedilol (COREG) 25 MG tablet Take 1 tablet (25 mg total) by mouth 2 (two) times daily with a meal. 180 tablet 0  . diltiazem (DILACOR XR) 180 MG 24 hr capsule Take 1 capsule (180 mg total) by mouth daily. 90 capsule 3  . DULoxetine (CYMBALTA) 60 MG capsule Take 1 capsule (60 mg total) by mouth daily. 90 capsule 1  . ezetimibe (ZETIA) 10 MG  tablet Take 1 tablet (10 mg total) by mouth daily. 90 tablet 3  . folic acid (FOLVITE) 1 MG tablet Take 1 tablet (1 mg total) by mouth daily. 90 tablet 1  . gabapentin (NEURONTIN) 800 MG tablet Take 1 tablet (800 mg total) by mouth 3 (three) times daily. 270 tablet 1  . glyBURIDE (DIABETA) 1.25 MG tablet Take 1 tablet (1.25 mg total) by mouth every morning. 90 tablet 3  . levothyroxine (SYNTHROID) 75 MCG tablet Take 1 tablet (75 mcg total) by mouth daily before breakfast. 90 tablet 0  . lisinopril (ZESTRIL) 30 MG tablet Take 1 tablet (30 mg total) by mouth daily. 90 tablet 3  . methocarbamol (ROBAXIN) 500 MG tablet Take 1 tablet (500 mg total) by mouth 2 (two) times daily. 180 tablet 0  . omeprazole (PRILOSEC) 20 MG capsule TAKE 1 CAPSULE BY MOUTH EVERY DAY 90 capsule  1  . oxybutynin (DITROPAN-XL) 10 MG 24 hr tablet Take 1 tablet (10 mg total) by mouth 2 (two) times daily. 180 tablet 3  . pravastatin (PRAVACHOL) 40 MG tablet Take 1 tablet (40 mg total) by mouth every evening. 90 tablet 3  . sitaGLIPtin-metformin (JANUMET) 50-1000 MG tablet Take 1 tablet by mouth 2 (two) times daily with a meal. 180 tablet 1  . ALPRAZolam (XANAX) 0.25 MG tablet Take 1 tablet (0.25 mg total) by mouth 2 (two) times daily as needed for anxiety. (Patient not taking: Reported on 08/01/2019) 20 tablet 0   No current facility-administered medications on file prior to visit.    The following portions of the patient's history were reviewed and updated as appropriate: allergies, current medications, past family history, past medical history, past social history, past surgical history and problem list.  ROS Otherwise as in subjective above  Objective: BP (!) 114/62   Pulse 80   Ht 5' 1.25" (1.556 m)   Wt (!) 220 lb 9.6 oz (100.1 kg)   SpO2 94%   BMI 41.34 kg/m   General appearance: alert, no distress, well developed, well nourished Ear canals bilaterally with impacted cerumen Neck: supple, no lymphadenopathy, no thyromegaly, no masses Heart: RRR, normal S1, S2, no murmurs Lungs: CTA bilaterally, no wheezes, rhonchi, or rales Pulses: 2+ radial pulses, 2+ pedal pulses, normal cap refill Ext: no edema Left thumb distal phalanx with trigger finger, tender over left thumb base, pain with range of motion of left thumb, rest of hand nontender no obvious swelling or bruising.  Left great toe tender throughout MTP and first phalanx, mild pain with range of motion which is full though, mild tenderness to the second toe throughout but no obvious bruising or swelling.  Rest of foot unremarkable Normal sensation and strength of toes feet fingers and hands  Foot exam Diabetic Foot Exam - Simple   Simple Foot Form Diabetic Foot exam was performed with the following findings: Yes 08/01/2019  10:22 AM  Visual Inspection See comments: Yes Sensation Testing Intact to touch and monofilament testing bilaterally: Yes Pulse Check Posterior Tibialis and Dorsalis pulse intact bilaterally: Yes Comments    See MSK exam    Assessment: Encounter Diagnoses  Name Primary?  . Trigger finger of left thumb Yes  . Pain of left thumb   . Left wrist pain   . Left foot pain   . Contusion of toe of left foot, unspecified toe, initial encounter   . Fall, initial encounter   . Essential hypertension, benign   . Fatty liver   . Type 2  diabetes mellitus with other specified complication, without long-term current use of insulin ([place])   . Hypothyroidism, unspecified type   . Chronic bilateral low back pain with sciatica, sciatica laterality unspecified   . Hyperlipidemia, unspecified hyperlipidemia type   . High risk medication use   . History of TIA (transient ischemic attack)   . Chronic neck pain   . Statin intolerance   . Impacted cerumen of both ears   . Elevated LFTs   . Muscle spasm   . Snoring   . Depression, major, single episode, moderate ([place])   . Hypertension associated with diabetes ([place])      Plan: Left wrist and left foot injury and pain -go for x-ray of left thumb.begin OTC thumb spice splint for left thumb.   Use rest, can use ice therapy bag of frozen peas 20 minutes on and off, splint x 2 weeks.   Tylenol for pain.   Left foot contusion - can use ice therapy, rest, avoid reinjury, wear shoes that are comfortable that offers some protection to toes.  Paroxsymal Afib - s/p ablation, c/t Apixaban  HTN, associated with diabetes  - c/t current medication, cardiology plans to get upcoming echocardiogram  Fatty liver disease-work on weight loss through healthy diet, low-fat diet, regular exercise  Hyperlipidemia-labs today, compliant with Zetia and the recently added statin although she has had statin issues with intolerance in the past  Diabetes-labs today, continue  medications, glucose monitoring, need to improve on diet exercise and weight loss, discussed routine diabetic care, daily foot checks, eye doctor visits  Chronic pain-continue current medication  Neuropathy-continue current medication  History of elevated LFTs-repeat labs today  Depression-advised counseling.  Continue Cymbalta  Hypothyroidism-labs today, continue current medication  Discussed findings.  Discussed risk/benefits of procedure and patient agrees to procedure. Successfully used warm water lavage to remove impacted cerumen from bilat ear canal. Patient tolerated procedure well. Advised they avoid using any cotton swabs or other devices to clean the ear canals.  Use basic hygiene as discussed.  Follow up prn.    BMI Metric Follow Up - 08/01/19 1223      BMI Metric Follow Up-Please document annually   BMI Metric Follow Up Education provided            Sandra Ruiz was seen today for follow-up and fall.  Diagnoses and all orders for this visit:  Trigger finger of left thumb -     DG Finger Thumb Left; Future  Pain of left thumb -     DG Finger Thumb Left; Future  Left wrist pain -     DG Finger Thumb Left; Future  Left foot pain  Contusion of toe of left foot, unspecified toe, initial encounter  Fall, initial encounter -     DG Finger Thumb Left; Future  Essential hypertension, benign  Fatty liver -     Lipid panel -     Comprehensive metabolic panel  Type 2 diabetes mellitus with other specified complication, without long-term current use of insulin (HCC) -     Hemoglobin A1c -     Comprehensive metabolic panel -     Microalbumin/Creatinine Ratio, Urine  Hypothyroidism, unspecified type -     TSH  Chronic bilateral low back pain with sciatica, sciatica laterality unspecified  Hyperlipidemia, unspecified hyperlipidemia type -     Lipid panel -     Comprehensive metabolic panel  High risk medication use  History of TIA (transient ischemic  attack)  Chronic neck  pain  Statin intolerance  Impacted cerumen of both ears  Elevated LFTs  Muscle spasm  Snoring  Depression, major, single episode, moderate ([place])  Hypertension associated with diabetes ([place])    Follow up: pending labs

## 2019-08-02 ENCOUNTER — Other Ambulatory Visit: Payer: Self-pay | Admitting: Medical

## 2019-08-02 DIAGNOSIS — I152 Hypertension secondary to endocrine disorders: Secondary | ICD-10-CM | POA: Insufficient documentation

## 2019-08-02 DIAGNOSIS — E1159 Type 2 diabetes mellitus with other circulatory complications: Secondary | ICD-10-CM | POA: Insufficient documentation

## 2019-08-02 LAB — LIPID PANEL
Chol/HDL Ratio: 3.5 ratio (ref 0.0–4.4)
Cholesterol, Total: 152 mg/dL (ref 100–199)
HDL: 44 mg/dL (ref >39)
LDL Chol Calc (NIH): 86 mg/dL (ref 0–99)
Triglycerides: 120 mg/dL (ref 0–149)
VLDL Cholesterol Cal: 22 mg/dL (ref 5–40)

## 2019-08-02 LAB — COMPREHENSIVE METABOLIC PANEL
ALT: 25 IU/L (ref 0–32)
AST: 34 IU/L (ref 0–40)
Albumin/Globulin Ratio: 1.8 (ref 1.2–2.2)
Albumin: 4.4 g/dL (ref 3.7–4.7)
Alkaline Phosphatase: 125 IU/L — ABNORMAL HIGH (ref 48–121)
BUN/Creatinine Ratio: 16 (ref 12–28)
BUN: 12 mg/dL (ref 8–27)
Bilirubin Total: 0.2 mg/dL (ref 0.0–1.2)
CO2: 23 mmol/L (ref 20–29)
Calcium: 9.5 mg/dL (ref 8.7–10.3)
Chloride: 102 mmol/L (ref 96–106)
Creatinine, Ser: 0.75 mg/dL (ref 0.57–1.00)
GFR calc Af Amer: 92 mL/min/{1.73_m2} (ref >59)
GFR calc non Af Amer: 80 mL/min/{1.73_m2} (ref >59)
Globulin, Total: 2.5 g/dL (ref 1.5–4.5)
Glucose: 137 mg/dL — ABNORMAL HIGH (ref 65–99)
Potassium: 4.5 mmol/L (ref 3.5–5.2)
Sodium: 139 mmol/L (ref 134–144)
Total Protein: 6.9 g/dL (ref 6.0–8.5)

## 2019-08-02 LAB — MICROALBUMIN / CREATININE URINE RATIO
Creatinine, Urine: 120.7 mg/dL
Microalb/Creat Ratio: 45 mg/g creat — ABNORMAL HIGH (ref 0–29)
Microalbumin, Urine: 54.4 ug/mL

## 2019-08-02 LAB — TSH: TSH: 1.59 u[IU]/mL (ref 0.450–4.500)

## 2019-08-02 LAB — HEMOGLOBIN A1C
Est. average glucose Bld gHb Est-mCnc: 140 mg/dL
Hgb A1c MFr Bld: 6.5 % — ABNORMAL HIGH (ref 4.8–5.6)

## 2019-08-02 MED ORDER — LEVOTHYROXINE SODIUM 75 MCG PO TABS: 75.0000 ug | ORAL_TABLET | Freq: Every day | ORAL | 0 refills | Status: DC

## 2019-09-03 ENCOUNTER — Other Ambulatory Visit: Payer: Self-pay | Admitting: Medical

## 2019-09-04 NOTE — Telephone Encounter (Signed)
Ok to refill 

## 2019-10-11 ENCOUNTER — Other Ambulatory Visit: Payer: Self-pay | Admitting: Medical

## 2019-10-11 ENCOUNTER — Telehealth: Payer: Self-pay

## 2019-10-11 MED ORDER — ALPRAZOLAM 0.25 MG PO TABS: 0.2500 mg | ORAL_TABLET | Freq: Two times a day (BID) | ORAL | 0 refills | Status: DC | PRN

## 2019-10-11 NOTE — Telephone Encounter (Signed)
I sent it to the local pharmacy and current so as I do not really send that type of controlled substance the mail order for 90-day supply

## 2019-10-11 NOTE — Telephone Encounter (Signed)
Received a fax form [employer] for a refill on his Alprazolam pt. Last apt was 08/01/19 and next apt. Is 11/01/19.

## 2019-10-24 DIAGNOSIS — E7800 Pure hypercholesterolemia, unspecified: Secondary | ICD-10-CM | POA: Diagnosis not present

## 2019-10-25 LAB — LIPID PANEL
Chol/HDL Ratio: 4.2 ratio (ref 0.0–4.4)
Cholesterol, Total: 186 mg/dL (ref 100–199)
HDL: 44 mg/dL (ref >39)
LDL Chol Calc (NIH): 117 mg/dL — ABNORMAL HIGH (ref 0–99)
Triglycerides: 142 mg/dL (ref 0–149)
VLDL Cholesterol Cal: 25 mg/dL (ref 5–40)

## 2019-10-25 LAB — HEPATIC FUNCTION PANEL
ALT: 26 IU/L (ref 0–32)
AST: 42 IU/L — ABNORMAL HIGH (ref 0–40)
Albumin: 4.1 g/dL (ref 3.7–4.7)
Alkaline Phosphatase: 114 IU/L (ref 44–121)
Bilirubin Total: 0.3 mg/dL (ref 0.0–1.2)
Bilirubin, Direct: <0.1 mg/dL (ref 0.00–0.40)
Total Protein: 6.2 g/dL (ref 6.0–8.5)

## 2019-11-01 ENCOUNTER — Ambulatory Visit (INDEPENDENT_AMBULATORY_CARE_PROVIDER_SITE_OTHER): Payer: Medicare HMO | Admitting: Medical

## 2019-11-01 ENCOUNTER — Other Ambulatory Visit: Payer: Self-pay

## 2019-11-01 ENCOUNTER — Encounter: Payer: Self-pay | Admitting: Medical

## 2019-11-01 VITALS — BP 130/70 | HR 88 | Ht 61.0 in | Wt 219.2 lb

## 2019-11-01 DIAGNOSIS — Z79899 Other long term (current) drug therapy: Secondary | ICD-10-CM

## 2019-11-01 DIAGNOSIS — F321 Major depressive disorder, single episode, moderate: Secondary | ICD-10-CM

## 2019-11-01 DIAGNOSIS — E1169 Type 2 diabetes mellitus with other specified complication: Secondary | ICD-10-CM

## 2019-11-01 DIAGNOSIS — E041 Nontoxic single thyroid nodule: Secondary | ICD-10-CM

## 2019-11-01 DIAGNOSIS — K760 Fatty (change of) liver, not elsewhere classified: Secondary | ICD-10-CM | POA: Diagnosis not present

## 2019-11-01 DIAGNOSIS — I10 Essential (primary) hypertension: Secondary | ICD-10-CM | POA: Diagnosis not present

## 2019-11-01 DIAGNOSIS — Z1159 Encounter for screening for other viral diseases: Secondary | ICD-10-CM | POA: Insufficient documentation

## 2019-11-01 DIAGNOSIS — Z23 Encounter for immunization: Secondary | ICD-10-CM | POA: Diagnosis not present

## 2019-11-01 DIAGNOSIS — E785 Hyperlipidemia, unspecified: Secondary | ICD-10-CM

## 2019-11-01 DIAGNOSIS — R7989 Other specified abnormal findings of blood chemistry: Secondary | ICD-10-CM | POA: Diagnosis not present

## 2019-11-01 DIAGNOSIS — E1159 Type 2 diabetes mellitus with other circulatory complications: Secondary | ICD-10-CM

## 2019-11-01 DIAGNOSIS — R1310 Dysphagia, unspecified: Secondary | ICD-10-CM | POA: Insufficient documentation

## 2019-11-01 DIAGNOSIS — E039 Hypothyroidism, unspecified: Secondary | ICD-10-CM

## 2019-11-01 DIAGNOSIS — I152 Hypertension secondary to endocrine disorders: Secondary | ICD-10-CM

## 2019-11-01 NOTE — Progress Notes (Signed)
Subjective:  Sandra Ruiz is a 74 y.o. female who presents for Chief Complaint  Patient presents with  . Follow-up    finger is better but feels like can't move hand sometimes  . Covid Vaccine     wants booster shot      Here for medication check on chronic issues.  Lately she is having trouble with swallowing.  She feels difficulty with both liquids and solids.  She denies vomiting or choking on food.  She wonders if this has anything to do with prior thyroid nodules.  She notes prior thyroid biopsies.  She feels like even liquids sometimes seems to have trouble going down been drinking.  No prior dilation of esophagus.  She would like to review her ultrasound of her thyroid from earlier in the year  She is compliant with her medicines for blood pressure without complaint  She is compliant with her thyroid medicine without complaint  She is compliant with her cholesterol medicine without complaint  Diabetes-she notes recent blood sugars have been okay.  Compliant with medication without complaint.  No foot lesions.  She has history of fatty liver disease on ultrasound in past years.  Trying to get some exercise and eat healthy.  No other aggravating or relieving factors.    No other c/o.  Past Medical History:  Diagnosis Date  . Atrial fibrillation ([place])   . Chronic pain   . Depression   . Diabetes mellitus without complication ([place])   . GERD (gastroesophageal reflux disease)   . History of TIA (transient ischemic attack)   . Hyperlipidemia   . Hypertension   . Thyroid disease    Current Outpatient Medications on File Prior to Visit  Medication Sig Dispense Refill  . ACCU-CHEK GUIDE test strip TEST 1 - 2 TIMES DAILY 200 strip 0  . Accu-Chek Softclix Lancets lancets CHECK BLOOD SUGAR ONE TO TWO TIMES DAILY. 200 each 0  . ALPRAZolam (XANAX) 0.25 MG tablet Take 1 tablet (0.25 mg total) by mouth 2 (two) times daily as needed for anxiety. 20 tablet 0  . apixaban (ELIQUIS) 5  MG TABS tablet Take 1 tablet (5 mg total) by mouth 2 (two) times daily. 180 tablet 1  . Blood Glucose Monitoring Suppl (ACCU-CHEK GUIDE ME) w/Device KIT Test 1-2 times daily 1 kit 0  . carvedilol (COREG) 25 MG tablet TAKE 1 TABLET (25 MG TOTAL) BY MOUTH 2 (TWO) TIMES DAILY WITH A MEAL. 180 tablet 0  . diltiazem (DILACOR XR) 180 MG 24 hr capsule Take 1 capsule (180 mg total) by mouth daily. 90 capsule 3  . DULoxetine (CYMBALTA) 60 MG capsule Take 1 capsule (60 mg total) by mouth daily. 90 capsule 1  . ezetimibe (ZETIA) 10 MG tablet Take 1 tablet (10 mg total) by mouth daily. 90 tablet 3  . folic acid (FOLVITE) 1 MG tablet Take 1 tablet (1 mg total) by mouth daily. 90 tablet 1  . gabapentin (NEURONTIN) 800 MG tablet Take 1 tablet (800 mg total) by mouth 3 (three) times daily. 270 tablet 1  . glyBURIDE (DIABETA) 1.25 MG tablet Take 1 tablet (1.25 mg total) by mouth every morning. 90 tablet 3  . levothyroxine (SYNTHROID) 75 MCG tablet TAKE 1 TABLET EVERY DAY BEFORE BREAKFAST 90 tablet 0  . lisinopril (ZESTRIL) 30 MG tablet Take 1 tablet (30 mg total) by mouth daily. 90 tablet 3  . methocarbamol (ROBAXIN) 500 MG tablet TAKE 1 TABLET TWICE DAILY 180 tablet 0  . omeprazole (PRILOSEC)  20 MG capsule TAKE 1 CAPSULE BY MOUTH EVERY DAY 90 capsule 1  . oxybutynin (DITROPAN-XL) 10 MG 24 hr tablet Take 1 tablet (10 mg total) by mouth 2 (two) times daily. 180 tablet 3  . sitaGLIPtin-metformin (JANUMET) 50-1000 MG tablet Take 1 tablet by mouth 2 (two) times daily with a meal. 180 tablet 1  . aspirin 81 MG EC tablet Take 81 mg by mouth daily. Swallow whole. (Patient not taking: Reported on 11/01/2019)    . pravastatin (PRAVACHOL) 40 MG tablet Take 1 tablet (40 mg total) by mouth every evening. 90 tablet 3   No current facility-administered medications on file prior to visit.     The following portions of the patient's history were reviewed and updated as appropriate: allergies, current medications, past family  history, past medical history, past social history, past surgical history and problem list.  ROS Otherwise as in subjective above  Objective: BP 130/70   Pulse 88   Ht _0  (1.549 m)   Wt 219 lb 3.2 oz (99.4 kg)   SpO2 95%   BMI 41.42 kg/m   General appearance: alert, no distress, well developed, well nourished HEENT: normocephalic, sclerae anicteric, conjunctiva pink and moist, TMs pearly, nares patent, no discharge or erythema, pharynx normal Oral cavity: MMM, no lesions Neck: supple, no lymphadenopathy, no obvious nodule or thyromegaly, no masses, normal ROM Heart: RRR, normal S1, S2, no murmurs Lungs: CTA bilaterally, no wheezes, rhonchi, or rales Pulses: 2+ radial pulses, 2+ pedal pulses, normal cap refill Ext: no edema    Assessment: Encounter Diagnoses  Name Primary?  . Essential hypertension, benign Yes  . Fatty liver   . Type 2 diabetes mellitus with other specified complication, without long-term current use of insulin ([place])   . Elevated LFTs   . High risk medication use   . Dysphagia, unspecified type   . Thyroid nodule   . Need for influenza vaccination   . Need for vaccination   . Encounter for hepatitis C screening test for low risk patient   . Hypertension associated with diabetes ([place])   . Hypothyroidism, unspecified type   . Depression, major, single episode, moderate ([place])   . Hyperlipidemia, unspecified hyperlipidemia type      Plan: Reviewed labs and notes from last visit.   HTN - c/t current medication  fall liver - counseled on need to lose weight through lifestyle changes  Diabetes - labs today, c/t current medicaiton  Dyslipidemia - c/t current medication  Hypothyroidism - c/t current medication  Elevated LFTs - she notes prior fatty liver disease.   I don't have access to prior US.   hepatitis serologies today  Swallow problem - refer for swallow study  Thyroid nodule - refer for biopsy.  revived 05/2019 [place]  Counseled on the  influenza virus vaccine.  Vaccine information sheet given.   High dose Influenza vaccine given after consent obtained.  Counseled on the [place] virus vaccine.  Vaccine information sheet given.  Moderna Covid vaccine given after consent obtained.   Icela was seen today for follow-up and covid vaccine .  Diagnoses and all orders for this visit:  Essential hypertension, benign  Fatty liver  Type 2 diabetes mellitus with other specified complication, without long-term current use of insulin (HCC) -     Hemoglobin A1c  Elevated LFTs -     Hepatitis C antibody -     Hepatitis B surface antigen -     Iron  High risk medication  use  Dysphagia, unspecified type -     SLP eval and treat; Future  Thyroid nodule -     US Thyroid Biopsy; Future  Need for influenza vaccination -     Flu Vaccine QUAD High Dose([facility])  Need for vaccination -     Cancel: SARSCOV2 MODERNA BOOSTER VACCINE, 50 MCG/0.25 ML, IM -     Moderna SARS-CoV-2 Vaccine  Encounter for hepatitis C screening test for low risk patient -     Hepatitis C antibody -     Hepatitis B surface antigen  Hypertension associated with diabetes ([place])  Hypothyroidism, unspecified type  Depression, major, single episode, moderate ([place])  Hyperlipidemia, unspecified hyperlipidemia type    Follow up: pending labs, biopsy, swallow study

## 2019-11-02 ENCOUNTER — Other Ambulatory Visit: Payer: Self-pay

## 2019-11-02 ENCOUNTER — Other Ambulatory Visit: Payer: Self-pay | Admitting: Medical

## 2019-11-02 LAB — HEMOGLOBIN A1C
Est. average glucose Bld gHb Est-mCnc: 143 mg/dL
Hgb A1c MFr Bld: 6.6 % — ABNORMAL HIGH (ref 4.8–5.6)

## 2019-11-02 LAB — IRON: Iron: 57 ug/dL (ref 27–139)

## 2019-11-02 LAB — HEPATITIS C ANTIBODY: Hep C Virus Ab: <0.1 s/co ratio (ref 0.0–0.9)

## 2019-11-02 LAB — HEPATITIS B SURFACE ANTIGEN: Hepatitis B Surface Ag: NEGATIVE

## 2019-11-02 MED ORDER — FOLIC ACID 1 MG PO TABS: 1.0000 mg | ORAL_TABLET | Freq: Every day | ORAL | 1 refills | Status: DC

## 2019-11-02 MED ORDER — APIXABAN 5 MG PO TABS: 5.0000 mg | ORAL_TABLET | Freq: Two times a day (BID) | ORAL | 1 refills | Status: DC

## 2019-11-02 MED ORDER — OMEPRAZOLE 20 MG PO CPDR: DELAYED_RELEASE_CAPSULE | ORAL | 2 refills | Status: DC

## 2019-11-02 MED ORDER — GABAPENTIN 800 MG PO TABS: 800.0000 mg | ORAL_TABLET | Freq: Three times a day (TID) | ORAL | 1 refills | Status: DC

## 2019-11-02 MED ORDER — EZETIMIBE 10 MG PO TABS: 10.0000 mg | ORAL_TABLET | Freq: Every day | ORAL | 3 refills | Status: DC

## 2019-11-02 MED ORDER — METHOCARBAMOL 500 MG PO TABS: 500.0000 mg | ORAL_TABLET | Freq: Two times a day (BID) | ORAL | 0 refills | Status: DC

## 2019-11-07 ENCOUNTER — Other Ambulatory Visit: Payer: Self-pay | Admitting: Medical

## 2019-11-07 ENCOUNTER — Telehealth: Payer: Self-pay

## 2019-11-07 DIAGNOSIS — E2839 Other primary ovarian failure: Secondary | ICD-10-CM

## 2019-11-07 DIAGNOSIS — Z780 Asymptomatic menopausal state: Secondary | ICD-10-CM

## 2019-11-07 NOTE — Telephone Encounter (Signed)
Order is placed.  She can have mammogram and bone density test done the same day if desired

## 2019-11-07 NOTE — Telephone Encounter (Signed)
Patient called and stated she needs an order placed to have Bone density screening. Please advise order.

## 2019-11-08 ENCOUNTER — Other Ambulatory Visit (HOSPITAL_COMMUNITY)
Admission: RE | Admit: 2019-11-08 | Discharge: 2019-11-08 | Disposition: A | Discharge status: Home / Self Care | Payer: Medicare HMO | Source: Ambulatory Visit | Attending: Radiology | Admitting: Radiology

## 2019-11-08 ENCOUNTER — Ambulatory Visit
Admission: RE | Admit: 2019-11-08 | Discharge: 2019-11-08 | Disposition: A | Discharge status: Home / Self Care | Payer: Medicare HMO | Source: Ambulatory Visit | Attending: Medical | Admitting: Medical

## 2019-11-08 DIAGNOSIS — E041 Nontoxic single thyroid nodule: Secondary | ICD-10-CM | POA: Diagnosis not present

## 2019-11-08 DIAGNOSIS — D440 Neoplasm of uncertain behavior of thyroid gland: Secondary | ICD-10-CM | POA: Insufficient documentation

## 2019-11-08 DIAGNOSIS — R896 Abnormal cytological findings in specimens from other organs, systems and tissues: Secondary | ICD-10-CM | POA: Diagnosis not present

## 2019-11-08 DIAGNOSIS — E079 Disorder of thyroid, unspecified: Secondary | ICD-10-CM | POA: Diagnosis present

## 2019-11-09 ENCOUNTER — Other Ambulatory Visit: Payer: Self-pay | Admitting: Medical

## 2019-11-09 DIAGNOSIS — Z1231 Encounter for screening mammogram for malignant neoplasm of breast: Secondary | ICD-10-CM

## 2019-11-12 LAB — CYTOLOGY - NON PAP

## 2019-11-16 NOTE — Progress Notes (Signed)
HPI: FU atrial fibrillation. Patient recently moved to this area from [place].  She has a history of paroxysmal atrial fibrillation and has had previous ablation.  Carotid Dopplers February 2018 showed no significant stenosis.  Transesophageal echocardiogram January 2020 in [place] showed normal LV function, mild mitral regurgitation and small secundum ASD.  Echocardiogram June 2021 showed normal LV function, mild left ventricular hypertrophy, grade 1 diastolic dysfunction.  Nuclear study June 2021 showed ejection fraction 72% with normal perfusion.  Since last seen patient is having difficulties with anxiety.  She states that when she feels anxious she has dyspnea.  She also notes some dyspnea on exertion.  No orthopnea, PND or pedal edema.  She also has chest heaviness.  It can radiate to her left upper extremity.  It occurs under stressful situations and can last several hours.  Not pleuritic.  No syncope.  Current Outpatient Medications  Medication Sig Dispense Refill  . ACCU-CHEK GUIDE test strip TEST 1 - 2 TIMES DAILY 200 strip 0  . Accu-Chek Softclix Lancets lancets CHECK BLOOD SUGAR ONE TO TWO TIMES DAILY. 200 each 0  . ALPRAZolam (XANAX) 0.25 MG tablet Take 1 tablet (0.25 mg total) by mouth 2 (two) times daily as needed for anxiety. 20 tablet 0  . apixaban (ELIQUIS) 5 MG TABS tablet Take 1 tablet (5 mg total) by mouth 2 (two) times daily. 180 tablet 1  . Blood Glucose Monitoring Suppl (ACCU-CHEK GUIDE ME) w/Device KIT Test 1-2 times daily 1 kit 0  . carvedilol (COREG) 25 MG tablet TAKE 1 TABLET (25 MG TOTAL) BY MOUTH 2 (TWO) TIMES DAILY WITH A MEAL. 180 tablet 0  . diltiazem (DILACOR XR) 180 MG 24 hr capsule Take 1 capsule (180 mg total) by mouth daily. 90 capsule 3  . DULoxetine (CYMBALTA) 60 MG capsule Take 1 capsule (60 mg total) by mouth daily. 90 capsule 1  . ezetimibe (ZETIA) 10 MG tablet Take 1 tablet (10 mg total) by mouth daily. 90 tablet 3  . folic acid (FOLVITE) 1  MG tablet Take 1 tablet (1 mg total) by mouth daily. 90 tablet 1  . gabapentin (NEURONTIN) 800 MG tablet Take 1 tablet (800 mg total) by mouth 3 (three) times daily. 270 tablet 1  . glyBURIDE (DIABETA) 1.25 MG tablet Take 1 tablet (1.25 mg total) by mouth every morning. 90 tablet 3  . levothyroxine (SYNTHROID) 75 MCG tablet TAKE 1 TABLET EVERY DAY BEFORE BREAKFAST 90 tablet 0  . lisinopril (ZESTRIL) 30 MG tablet Take 1 tablet (30 mg total) by mouth daily. 90 tablet 3  . methocarbamol (ROBAXIN) 500 MG tablet Take 1 tablet (500 mg total) by mouth 2 (two) times daily. 180 tablet 0  . omeprazole (PRILOSEC) 20 MG capsule TAKE 1 CAPSULE BY MOUTH EVERY DAY 90 capsule 2  . oxybutynin (DITROPAN-XL) 10 MG 24 hr tablet Take 1 tablet (10 mg total) by mouth 2 (two) times daily. 180 tablet 3  . pravastatin (PRAVACHOL) 40 MG tablet Take 1 tablet (40 mg total) by mouth every evening. 90 tablet 3  . sitaGLIPtin-metformin (JANUMET) 50-1000 MG tablet Take 1 tablet by mouth 2 (two) times daily with a meal. 180 tablet 1   No current facility-administered medications for this visit.     Past Medical History:  Diagnosis Date  . Atrial fibrillation ([place])   . Chronic pain   . Depression   . Diabetes mellitus without complication ([place])   . GERD (gastroesophageal reflux disease)   .  History of TIA (transient ischemic attack)   . Hyperlipidemia   . Hypertension   . Thyroid disease     Past Surgical History:  Procedure Laterality Date  . ATRIAL FIBRILLATION ABLATION    . CARDIAC CATHETERIZATION    . CERVICAL SPINE SURGERY    . LUMBAR SPINE SURGERY    . TONSILLECTOMY AND ADENOIDECTOMY      Social History   Socioeconomic History  . Marital status: Married    Spouse name: Not on file  . Number of children: Not on file  . Years of education: Not on file  . Highest education level: Not on file  Occupational History  . Not on file  Tobacco Use  . Smoking status: Former Research scientist (life sciences)  . Smokeless tobacco:  Never Used  Substance and Sexual Activity  . Alcohol use: Yes  . Drug use: Never  . Sexual activity: Not on file  Other Topics Concern  . Not on file  Social History Narrative   From [place], married, [place] Witness, new from [place] to [place]  07/2019   Social Determinants of Health   Financial Resource Strain:   . Difficulty of Paying Living Expenses: Not on file  Food Insecurity:   . Worried About Charity fundraiser in the Last Year: Not on file  . Ran Out of Food in the Last Year: Not on file  Transportation Needs:   . Lack of Transportation (Medical): Not on file  . Lack of Transportation (Non-Medical): Not on file  Physical Activity:   . Days of Exercise per Week: Not on file  . Minutes of Exercise per Session: Not on file  Stress:   . Feeling of Stress : Not on file  Social Connections:   . Frequency of Communication with Friends and Family: Not on file  . Frequency of Social Gatherings with Friends and Family: Not on file  . Attends Religious Services: Not on file  . Active Member of Clubs or Organizations: Not on file  . Attends Archivist Meetings: Not on file  . Marital Status: Not on file  Intimate Partner Violence:   . Fear of Current or Ex-Partner: Not on file  . Emotionally Abused: Not on file  . Physically Abused: Not on file  . Sexually Abused: Not on file    Family History  Problem Relation Age of Onset  . CAD Mother   . CAD Father     ROS: no fevers or chills, productive cough, hemoptysis, dysphasia, odynophagia, melena, hematochezia, dysuria, hematuria, rash, seizure activity, orthopnea, PND, pedal edema, claudication. Remaining systems are negative.  Physical Exam: Well-developed well-nourished in no acute distress.  Skin is warm and dry.  HEENT is normal.  Neck is supple.  Chest is clear to auscultation with normal expansion.  Cardiovascular exam is regular rate and rhythm.  Abdominal exam nontender or  distended. No masses palpated. Extremities show no edema. neuro grossly intact  ECG-normal sinus rhythm at a rate of 85, no ST changes.  Personally reviewed.  A/P  1 paroxysmal atrial fibrillation-status post ablation.  She remains in sinus.  Continue apixaban.  2 history of small secundum ASD-based on transesophageal echocardiogram from [place] in 2020.  Her right heart is not dilated.  She is not having symptoms.  We will follow.  3 history of chest pain-patient continues to have chest pain.  It occurs predominantly when she is anxious.  She notes some with activities.  Electrocardiogram shows no  new ST changes.  We will plan to proceed with a cardiac CTA for more definitive evaluation.  4 hyperlipidemia-patient did not tolerate Crestor or Lipitor previously.  Continue pravastatin and Zetia.  5 hypertension-blood pressure controlled.  Continue present medical regimen and follow.  6 diabetes mellitus-Per primary care.  Kirk Ruths, MD

## 2019-11-17 DIAGNOSIS — E041 Nontoxic single thyroid nodule: Secondary | ICD-10-CM | POA: Diagnosis not present

## 2019-11-21 ENCOUNTER — Other Ambulatory Visit: Payer: Self-pay

## 2019-11-21 ENCOUNTER — Encounter: Payer: Self-pay | Admitting: Cardiology

## 2019-11-21 ENCOUNTER — Ambulatory Visit: Payer: Medicare HMO | Admitting: Cardiology

## 2019-11-21 VITALS — BP 134/68 | HR 87 | Ht 61.0 in | Wt 219.8 lb

## 2019-11-21 DIAGNOSIS — I10 Essential (primary) hypertension: Secondary | ICD-10-CM

## 2019-11-21 DIAGNOSIS — I480 Paroxysmal atrial fibrillation: Secondary | ICD-10-CM

## 2019-11-21 DIAGNOSIS — R072 Precordial pain: Secondary | ICD-10-CM

## 2019-11-21 DIAGNOSIS — E7800 Pure hypercholesterolemia, unspecified: Secondary | ICD-10-CM | POA: Diagnosis not present

## 2019-11-21 MED ORDER — METOPROLOL TARTRATE 100 MG PO TABS: ORAL_TABLET | ORAL | 0 refills | Status: DC

## 2019-11-21 NOTE — Patient Instructions (Addendum)
Testing/Procedures:  Your cardiac CT will be scheduled at one of the below locations:   [facility] [address] [place], [place] [address] [phone]   If scheduled at [facility], please arrive at the [facility] main entrance of [facility] 30 minutes prior to test start time. Proceed to the [facility] Radiology Department (first floor) to check-in and test prep.  Please follow these instructions carefully (unless otherwise directed):    On the Night Before the Test: . Be sure to Drink plenty of water. . Do not consume any caffeinated/decaffeinated beverages or chocolate 12 hours prior to your test. . Do not take any antihistamines 12 hours prior to your test.   On the Day of the Test: . Drink plenty of water. Do not drink any water within one hour of the test. . Do not eat any food 4 hours prior to the test. . You may take your regular medications prior to the test.  . Take metoprolol (Lopressor) 100 MG two hours prior to test. . HOLD Furosemide/Hydrochlorothiazide morning of the test. . FEMALES- please wear underwire-free bra if available        After the Test: . Drink plenty of water. . After receiving IV contrast, you may experience a mild flushed feeling. This is normal. . On occasion, you may experience a mild rash up to 24 hours after the test. This is not dangerous. If this occurs, you can take Benadryl 25 mg and increase your fluid intake. . If you experience trouble breathing, this can be serious. If it is severe call 911 IMMEDIATELY. If it is mild, please call our office. . If you take any of these medications: Glipizide/Metformin, Avandament, Glucavance, please do not take 48 hours after completing test unless otherwise instructed.   Once we have confirmed authorization from your insurance company, we will call you to set up a date and time for your test. Based on how quickly your insurance processes prior authorizations  requests, please allow up to 4 weeks to be contacted for scheduling your Cardiac CT appointment. Be advised that routine Cardiac CT appointments could be scheduled as many as 8 weeks after your provider has ordered it.  For non-scheduling related questions, please contact the cardiac imaging nurse navigator should you have any questions/concerns: Marchia Bond, Cardiac Imaging Nurse Navigator Burley Saver, Interim Cardiac Imaging Nurse [address] [facility] Direct Office Dial: [phone]   For scheduling needs, including cancellations and rescheduling, please call [place], [phone] (temporary number).      Follow-Up: At [facility], you and your health needs are our priority.  As part of our continuing mission to provide you with exceptional heart care, we have created designated Provider Care Teams.  These Care Teams include your primary Cardiologist (physician) and Advanced Practice Providers (APPs -  Physician Assistants and Nurse Practitioners) who all work together to provide you with the care you need, when you need it.  We recommend signing up for the patient portal called "MyChart".  Sign up information is provided on this After Visit Summary.  MyChart is used to connect with patients for Virtual Visits (Telemedicine).  Patients are able to view lab/test results, encounter notes, upcoming appointments, etc.  Non-urgent messages can be sent to your provider as well.   To learn more about what you can do with MyChart, go to [email or web address].    Your next appointment:   6 month(s)  The format for your next appointment:  In Person  Provider:   Kirk Ruths, MD

## 2019-11-28 ENCOUNTER — Other Ambulatory Visit: Payer: Self-pay

## 2019-11-28 DIAGNOSIS — E041 Nontoxic single thyroid nodule: Secondary | ICD-10-CM

## 2019-12-03 ENCOUNTER — Other Ambulatory Visit: Payer: Self-pay | Admitting: *Deleted

## 2019-12-03 ENCOUNTER — Encounter (HOSPITAL_COMMUNITY): Payer: Self-pay

## 2019-12-03 DIAGNOSIS — R072 Precordial pain: Secondary | ICD-10-CM

## 2019-12-07 DIAGNOSIS — R072 Precordial pain: Secondary | ICD-10-CM | POA: Diagnosis not present

## 2019-12-08 LAB — BASIC METABOLIC PANEL
BUN/Creatinine Ratio: 21 (ref 12–28)
BUN: 16 mg/dL (ref 8–27)
CO2: 19 mmol/L — ABNORMAL LOW (ref 20–29)
Calcium: 9 mg/dL (ref 8.7–10.3)
Chloride: 104 mmol/L (ref 96–106)
Creatinine, Ser: 0.76 mg/dL (ref 0.57–1.00)
GFR calc Af Amer: 91 mL/min/{1.73_m2} (ref >59)
GFR calc non Af Amer: 79 mL/min/{1.73_m2} (ref >59)
Glucose: 85 mg/dL (ref 65–99)
Potassium: 4.4 mmol/L (ref 3.5–5.2)
Sodium: 140 mmol/L (ref 134–144)

## 2019-12-10 ENCOUNTER — Telehealth: Payer: Self-pay | Admitting: Cardiology

## 2019-12-10 MED ORDER — METOPROLOL TARTRATE 100 MG PO TABS
ORAL_TABLET | ORAL | 0 refills | Status: DC
Start: 2019-12-10 — End: 2020-02-04

## 2019-12-10 NOTE — Telephone Encounter (Signed)
Patient is requesting to review instructions for CT. She states she would like to know if she needs to take her medication this morning. Please advise.

## 2019-12-10 NOTE — Telephone Encounter (Signed)
Spoke with pt, she is aware she can take all of her medications the day of her CT scan and the metoprolol. She reports she picked up the metoprolol and took it already. Patient voiced understanding that the metoprolol is only to be taken 2 hours prior to the ct scan. New script sent to the pharmacy

## 2019-12-11 ENCOUNTER — Telehealth (HOSPITAL_COMMUNITY): Payer: Self-pay | Admitting: *Deleted

## 2019-12-11 ENCOUNTER — Encounter: Payer: Self-pay | Admitting: *Deleted

## 2019-12-11 NOTE — Telephone Encounter (Signed)
Reaching out to patient to offer assistance regarding upcoming cardiac imaging study; pt verbalizes understanding of appt date/time, parking situation and where to check in, pre-test NPO status and medications ordered, and verified current allergies; name and call back number provided for further questions should they arise  [facility] [phone] office ([phone] cell  Pt states that her husband will be coming with her to the appointment.

## 2019-12-13 ENCOUNTER — Ambulatory Visit (HOSPITAL_COMMUNITY)
Admission: RE | Admit: 2019-12-13 | Discharge: 2019-12-13 | Disposition: A | Discharge status: Home / Self Care | Payer: Medicare HMO | Source: Ambulatory Visit | Attending: Cardiology | Admitting: Cardiology

## 2019-12-13 ENCOUNTER — Other Ambulatory Visit: Payer: Self-pay

## 2019-12-13 DIAGNOSIS — R072 Precordial pain: Secondary | ICD-10-CM | POA: Insufficient documentation

## 2019-12-13 MED ORDER — METOPROLOL TARTRATE 5 MG/5ML IV SOLN
5.0000 mg | INTRAVENOUS | Status: DC | PRN
  Administered 2019-12-13 (×3): 5 mg via INTRAVENOUS

## 2019-12-13 MED ORDER — NITROGLYCERIN 0.4 MG SL SUBL
SUBLINGUAL_TABLET | SUBLINGUAL | Status: AC
  Filled 2019-12-13: qty 2

## 2019-12-13 MED ORDER — NITROGLYCERIN 0.4 MG SL SUBL
0.8000 mg | SUBLINGUAL_TABLET | Freq: Once | SUBLINGUAL | Status: AC
  Administered 2019-12-13: 0.8 mg via SUBLINGUAL

## 2019-12-13 MED ORDER — IOHEXOL 350 MG/ML SOLN
80.0000 mL | Freq: Once | INTRAVENOUS | Status: AC | PRN
  Administered 2019-12-13: 80 mL via INTRAVENOUS

## 2019-12-13 MED ORDER — METOPROLOL TARTRATE 5 MG/5ML IV SOLN
INTRAVENOUS | Status: AC
  Filled 2019-12-13: qty 5

## 2019-12-13 MED ORDER — SODIUM CHLORIDE 0.9 % IV SOLN: INTRAVENOUS | Status: DC

## 2019-12-14 ENCOUNTER — Telehealth: Payer: Self-pay | Admitting: *Deleted

## 2019-12-14 MED ORDER — ASPIRIN EC 81 MG PO TBEC: 81.0000 mg | DELAYED_RELEASE_TABLET | Freq: Every day | ORAL | 3 refills | Status: DC

## 2019-12-14 NOTE — Telephone Encounter (Addendum)
-----   Message from Lelon Perla, MD sent at 12/13/2019  1:51 PM EST ----- Minimal nonobstructive CAD; continue zetia and pravachol; add asa 81 mg daily. Kirk Ruths  Spoke with pt, Aware of dr Jacalyn Lefevre recommendations.

## 2019-12-18 ENCOUNTER — Other Ambulatory Visit: Payer: Self-pay | Admitting: Medical

## 2019-12-19 ENCOUNTER — Other Ambulatory Visit: Payer: Self-pay

## 2019-12-19 ENCOUNTER — Ambulatory Visit
Admission: RE | Admit: 2019-12-19 | Discharge: 2019-12-19 | Disposition: A | Discharge status: Home / Self Care | Payer: Medicare HMO | Source: Ambulatory Visit | Attending: Medical | Admitting: Medical

## 2019-12-19 DIAGNOSIS — Z1231 Encounter for screening mammogram for malignant neoplasm of breast: Secondary | ICD-10-CM

## 2019-12-19 LAB — HM MAMMOGRAPHY

## 2019-12-21 DIAGNOSIS — Z961 Presence of intraocular lens: Secondary | ICD-10-CM | POA: Diagnosis not present

## 2019-12-21 DIAGNOSIS — H5203 Hypermetropia, bilateral: Secondary | ICD-10-CM

## 2019-12-21 DIAGNOSIS — H52203 Unspecified astigmatism, bilateral: Secondary | ICD-10-CM | POA: Diagnosis not present

## 2019-12-21 DIAGNOSIS — H524 Presbyopia: Secondary | ICD-10-CM | POA: Diagnosis not present

## 2019-12-21 DIAGNOSIS — E119 Type 2 diabetes mellitus without complications: Secondary | ICD-10-CM | POA: Diagnosis not present

## 2019-12-21 DIAGNOSIS — Z7984 Long term (current) use of oral hypoglycemic drugs: Secondary | ICD-10-CM | POA: Diagnosis not present

## 2019-12-21 DIAGNOSIS — Z0100 Encounter for examination of eyes and vision without abnormal findings: Secondary | ICD-10-CM | POA: Diagnosis not present

## 2019-12-21 DIAGNOSIS — H43393 Other vitreous opacities, bilateral: Secondary | ICD-10-CM | POA: Diagnosis not present

## 2019-12-21 DIAGNOSIS — Z83511 Family history of glaucoma: Secondary | ICD-10-CM | POA: Diagnosis not present

## 2019-12-21 DIAGNOSIS — H40023 Open angle with borderline findings, high risk, bilateral: Secondary | ICD-10-CM | POA: Diagnosis not present

## 2019-12-21 HISTORY — DX: Unspecified astigmatism, bilateral: H52.203

## 2019-12-21 HISTORY — DX: Hypermetropia, bilateral: H52.03

## 2019-12-21 HISTORY — DX: Unspecified astigmatism, bilateral: H52.4

## 2019-12-21 LAB — HM DIABETES EYE EXAM

## 2020-01-08 ENCOUNTER — Encounter: Payer: Self-pay | Admitting: Endocrinology

## 2020-01-08 ENCOUNTER — Ambulatory Visit: Payer: Medicare HMO | Admitting: Endocrinology

## 2020-01-08 ENCOUNTER — Other Ambulatory Visit: Payer: Self-pay

## 2020-01-08 VITALS — BP 138/76 | HR 90 | Ht 62.0 in | Wt 220.0 lb

## 2020-01-08 DIAGNOSIS — E039 Hypothyroidism, unspecified: Secondary | ICD-10-CM | POA: Diagnosis not present

## 2020-01-08 NOTE — Progress Notes (Signed)
Subjective:    Patient ID: Sandra Ruiz, female    DOB: 08-16-1945, 75 y.o.   MRN: [id number]  HPI Pt is referred by Chana Bode, PA, for nodular thyroid.  Pt was noted to have a thyroid nodule in 2020.  she has had hypothyroidism since 2002.  she has no h/o XRT or surgery to the neck.  She had bxs in 2020 and 2021.  She does not know the result of the 2020 bx, but that she gave info to primary care provider (prob being scanned now).   Past Medical History:  Diagnosis Date  . Atrial fibrillation ([place])   . Chronic pain   . Depression   . Diabetes mellitus without complication ([place])   . GERD (gastroesophageal reflux disease)   . History of TIA (transient ischemic attack)   . Hyperlipidemia   . Hypertension   . Lyme disease   . Thyroid disease     Past Surgical History:  Procedure Laterality Date  . ATRIAL FIBRILLATION ABLATION    . CARDIAC CATHETERIZATION    . CERVICAL SPINE SURGERY    . LUMBAR SPINE SURGERY    . TONSILLECTOMY AND ADENOIDECTOMY      Social History   Socioeconomic History  . Marital status: Married    Spouse name: Not on file  . Number of children: Not on file  . Years of education: Not on file  . Highest education level: Not on file  Occupational History  . Not on file  Tobacco Use  . Smoking status: Former Research scientist (life sciences)  . Smokeless tobacco: Never Used  Substance and Sexual Activity  . Alcohol use: Yes  . Drug use: Never  . Sexual activity: Not on file  Other Topics Concern  . Not on file  Social History Narrative   From [place], married, [place] Witness, new from [place] to [place]  07/2019   Social Determinants of Health   Financial Resource Strain: Not on file  Food Insecurity: Not on file  Transportation Needs: Not on file  Physical Activity: Not on file  Stress: Not on file  Social Connections: Not on file  Intimate Partner Violence: Not on file    Current Outpatient Medications on File Prior to Visit   Medication Sig Dispense Refill  . ACCU-CHEK GUIDE test strip TEST 1 - 2 TIMES DAILY 200 strip 0  . Accu-Chek Softclix Lancets lancets CHECK BLOOD SUGAR ONE TO TWO TIMES DAILY. 200 each 0  . ALPRAZolam (XANAX) 0.25 MG tablet Take 1 tablet (0.25 mg total) by mouth 2 (two) times daily as needed for anxiety. 20 tablet 0  . apixaban (ELIQUIS) 5 MG TABS tablet Take 1 tablet (5 mg total) by mouth 2 (two) times daily. 180 tablet 1  . aspirin EC 81 MG tablet Take 1 tablet (81 mg total) by mouth daily. Swallow whole. 90 tablet 3  . Blood Glucose Monitoring Suppl (ACCU-CHEK GUIDE ME) w/Device KIT Test 1-2 times daily 1 kit 0  . carvedilol (COREG) 25 MG tablet TAKE 1 TABLET (25 MG TOTAL) BY MOUTH 2 (TWO) TIMES DAILY WITH A MEAL. 180 tablet 0  . diltiazem (DILACOR XR) 180 MG 24 hr capsule Take 1 capsule (180 mg total) by mouth daily. 90 capsule 3  . DULoxetine (CYMBALTA) 60 MG capsule TAKE 1 CAPSULE (60 MG TOTAL) BY MOUTH DAILY. 90 capsule 1  . ezetimibe (ZETIA) 10 MG tablet Take 1 tablet (10 mg total) by mouth daily. 90 tablet 3  .  folic acid (FOLVITE) 1 MG tablet Take 1 tablet (1 mg total) by mouth daily. 90 tablet 1  . gabapentin (NEURONTIN) 800 MG tablet Take 1 tablet (800 mg total) by mouth 3 (three) times daily. 270 tablet 1  . glyBURIDE (DIABETA) 1.25 MG tablet Take 1 tablet (1.25 mg total) by mouth every morning. 90 tablet 3  . JANUMET 50-1000 MG tablet TAKE 1 TABLET BY MOUTH 2 (TWO) TIMES DAILY WITH A MEAL. 180 tablet 0  . levothyroxine (SYNTHROID) 75 MCG tablet TAKE 1 TABLET EVERY DAY BEFORE BREAKFAST 90 tablet 0  . lisinopril (ZESTRIL) 30 MG tablet Take 1 tablet (30 mg total) by mouth daily. 90 tablet 3  . methocarbamol (ROBAXIN) 500 MG tablet TAKE 1 TABLET TWICE DAILY 180 tablet 0  . metoprolol tartrate (LOPRESSOR) 100 MG tablet TAKE 2 HOURS PRIOR TO CT SCAN 1 tablet 0  . omeprazole (PRILOSEC) 20 MG capsule TAKE 1 CAPSULE BY MOUTH EVERY DAY 90 capsule 2  . oxybutynin (DITROPAN-XL) 10 MG 24 hr  tablet Take 1 tablet (10 mg total) by mouth 2 (two) times daily. 180 tablet 3  . pravastatin (PRAVACHOL) 40 MG tablet Take 1 tablet (40 mg total) by mouth every evening. 90 tablet 3   No current facility-administered medications on file prior to visit.    Allergies  Allergen Reactions  . Baclofen Swelling  . Crestor [Rosuvastatin]     Extreme myalgias  . Lipitor [Atorvastatin]     Extreme myalgias  . Silicone   . Verapamil Swelling    Family History  Problem Relation Age of Onset  . CAD Mother   . Thyroid disease Mother   . CAD Father   . Hypothyroidism Daughter     BP 138/76   Pulse 90   Ht _0  (1.575 m)   Wt 220 lb (99.8 kg)   SpO2 95%   BMI 40.24 kg/m   Review of Systems Denies hoarseness and sob.    Objective:   Physical Exam VITAL SIGNS:  See vs page.   GENERAL: no distress.   NECK: a healed scar is present (C-spine procedure).  I cannot appreciate the thyroid nodules.    Lab Results  Component Value Date   TSH 1.590 08/01/2019   [place]: Normal-sized thyroid with nodules as above. The isthmic nodule meets criteria for FNA biopsy  FINAL MICROSCOPIC DIAGNOSIS:  Atypia of undetermined significance (Bethesda category III).  AFIRMA: low risk.    Pt signs release of information for other records.      Assessment & Plan:  Hypothyroidism, well-replaced MNG: we discussed.  Need for f/u  Patient Instructions  Please continue the same levothyroxine.   Please come back for a follow-up appointment in 1 year.

## 2020-01-08 NOTE — Patient Instructions (Addendum)
Please continue the same levothyroxine.   Please come back for a follow-up appointment in 1 year.   

## 2020-02-04 ENCOUNTER — Telehealth: Payer: Self-pay | Admitting: Medical

## 2020-02-04 ENCOUNTER — Encounter: Payer: Self-pay | Admitting: Medical

## 2020-02-04 ENCOUNTER — Ambulatory Visit (INDEPENDENT_AMBULATORY_CARE_PROVIDER_SITE_OTHER): Payer: Medicare HMO | Admitting: Medical

## 2020-02-04 ENCOUNTER — Other Ambulatory Visit: Payer: Self-pay

## 2020-02-04 VITALS — BP 110/60 | HR 78 | Ht 62.0 in | Wt 222.8 lb

## 2020-02-04 DIAGNOSIS — R29898 Other symptoms and signs involving the musculoskeletal system: Secondary | ICD-10-CM | POA: Diagnosis not present

## 2020-02-04 DIAGNOSIS — E785 Hyperlipidemia, unspecified: Secondary | ICD-10-CM

## 2020-02-04 DIAGNOSIS — I10 Essential (primary) hypertension: Secondary | ICD-10-CM

## 2020-02-04 DIAGNOSIS — E1159 Type 2 diabetes mellitus with other circulatory complications: Secondary | ICD-10-CM

## 2020-02-04 DIAGNOSIS — G8929 Other chronic pain: Secondary | ICD-10-CM

## 2020-02-04 DIAGNOSIS — K760 Fatty (change of) liver, not elsewhere classified: Secondary | ICD-10-CM

## 2020-02-04 DIAGNOSIS — M5440 Lumbago with sciatica, unspecified side: Secondary | ICD-10-CM | POA: Diagnosis not present

## 2020-02-04 DIAGNOSIS — I152 Hypertension secondary to endocrine disorders: Secondary | ICD-10-CM

## 2020-02-04 DIAGNOSIS — R0683 Snoring: Secondary | ICD-10-CM

## 2020-02-04 DIAGNOSIS — Z8673 Personal history of transient ischemic attack (TIA), and cerebral infarction without residual deficits: Secondary | ICD-10-CM | POA: Diagnosis not present

## 2020-02-04 DIAGNOSIS — Z1211 Encounter for screening for malignant neoplasm of colon: Secondary | ICD-10-CM | POA: Insufficient documentation

## 2020-02-04 DIAGNOSIS — E1169 Type 2 diabetes mellitus with other specified complication: Secondary | ICD-10-CM

## 2020-02-04 DIAGNOSIS — Z789 Other specified health status: Secondary | ICD-10-CM | POA: Diagnosis not present

## 2020-02-04 DIAGNOSIS — E039 Hypothyroidism, unspecified: Secondary | ICD-10-CM | POA: Diagnosis not present

## 2020-02-04 DIAGNOSIS — R400 Somnolence: Secondary | ICD-10-CM

## 2020-02-04 DIAGNOSIS — E7800 Pure hypercholesterolemia, unspecified: Secondary | ICD-10-CM

## 2020-02-04 MED ORDER — PRAVASTATIN SODIUM 40 MG PO TABS: 40.0000 mg | ORAL_TABLET | Freq: Every evening | ORAL | 3 refills | Status: DC

## 2020-02-04 NOTE — Telephone Encounter (Signed)
Refer to [place] neuro[facility] for chronic back pain.

## 2020-02-04 NOTE — Telephone Encounter (Signed)
Spoke with pt, the metoprolol on her medication list was for the one dose that was to be taken prior to her cardiac ct scan. She is not taking it on a daily bases. All of her medications were confirmed. She reports he told her she was in atrial fib again. She reports last night an episode of heart racing and SOB. She reports she has had 2 ablations in the past in [place] and did not think she would have it again. She is wondering what she needs to do. She reports fatigue currently. Aware will get her into the atrial fib clinic to see what the next step would be. Patient aware to continue all current medications. Pt agreed with this plan. Will forward this to the atrial fib clinic to help with scheduling.

## 2020-02-04 NOTE — Progress Notes (Addendum)
Subjective:  Sandra Ruiz is a 75 y.o. female who presents for Chief Complaint  Patient presents with   Medication Management     Here for med check  She is compliant with her blood pressure medicines without complaint. She is not currently checking blood pressures.  She does not have a home blood pressure cuff  Diabetes-she is compliant with her Janumet and glyburide for diabetes.  Home readings have been running good such as 82 fasting glucose, 110 fasting glucose.  No foot lesions.  She has seen her eye doctor recently  Hyperlipidemia-she is compliant with Zetia and pravachol without complaint  Her main concern is continued ongoing issues with chronic back pain, chronic leg pain, numbness in the legs and neuropathy.  She was seen neurosurgery and pain clinic back in [place] before she moved this past year to [place].  She is now using a rolling walker again.  She needs a handicap card application signed today.  She fell out of bed a few weeks ago given the pain amd weakness of the legs No other aggravating or relieving factors.    She  had her thyroid nodule biopsy in November 2021  She just recently had her mammogram, and recently saw the eye doctor  Snores.  She has concern for sleep apnea.  +daytime somnolence, fatigue.  No other c/o.  Past Medical History:  Diagnosis Date   Chronic pain    Depression    Diabetes mellitus without complication (HCC)    GERD (gastroesophageal reflux disease)    History of TIA (transient ischemic attack)    Hyperlipidemia    Hyperopia of both eyes with astigmatism and presbyopia 12/21/2019   Hypertension    Lyme disease    Paroxysmal atrial fibrillation (HCC)    Thyroid disease    Current Outpatient Medications on File Prior to Visit  Medication Sig Dispense Refill   ACCU-CHEK GUIDE test strip TEST 1 - 2 TIMES DAILY 200 strip 0   ALPRAZolam (XANAX) 0.25 MG tablet Take 1 tablet (0.25 mg total) by mouth 2 (two) times daily as needed  for anxiety. 20 tablet 0   Blood Glucose Monitoring Suppl (ACCU-CHEK GUIDE ME) w/Device KIT Test 1-2 times daily 1 kit 0   ezetimibe (ZETIA) 10 MG tablet Take 1 tablet (10 mg total) by mouth daily. 90 tablet 3   gabapentin (NEURONTIN) 800 MG tablet Take 1 tablet (800 mg total) by mouth 3 (three) times daily. 270 tablet 1   No current facility-administered medications on file prior to visit.     The following portions of the patient's history were reviewed and updated as appropriate: allergies, current medications, past family history, past medical history, past social history, past surgical history and problem list.  ROS Otherwise as in subjective above   Objective: BP 110/60    Pulse 78    Ht 5' 2"  (1.575 m)    Wt 222 lb 12.8 oz (101.1 kg)    SpO2 95%    BMI 40.75 kg/m   General appearance: alert, no distress, well developed, well nourished, seated with walker, in pain when going from chair to exam tablet Neck: supple, no lymphadenopathy, no thyromegaly, no masses, anterior cervical surgical scar  Heart: 2/6 brief holosystolic murmur heard best in upper sternal borders, irregularly irregular rhythm otherwise normal S1, S2 Lungs: CTA bilaterally, no wheezes, rhonchi, or rales Back: Lumbar spine tenderness, seems to be a little weak getting in and out of chairs in terms of her leg  strength, decreased back range of motion in general Abdomen: +bs, soft, non tender, non distended, no masses, no hepatomegaly, no splenomegaly Pulses: 2+ radial pulses, 2+ pedal pulses, normal cap refill Ext: no edema  Diabetic Foot Exam - Simple   Simple Foot Form Diabetic Foot exam was performed with the following findings: Yes 02/04/2020  2:02 PM  Visual Inspection No deformities, no ulcerations, no other skin breakdown bilaterally: Yes Sensation Testing See comments: Yes Pulse Check See comments: Yes Comments 1+ pedal pulses, decreased sensation bilat feet, unable to feel much of any monofilament  testing    EKG indication abnormal heart rhythm, rate 78 bpm, PR interval indeterminate, QRS 82 ms, QTC 410 ms, axis 54 degrees, atrial fibrillation noted   Assessment: Encounter Diagnoses  Name Primary?   Type 2 diabetes mellitus with other specified complication, without long-term current use of insulin (HCC) Yes   Essential hypertension, benign    Hypertension associated with diabetes ([place])    Hypothyroidism, unspecified type    Chronic bilateral low back pain with sciatica, sciatica laterality unspecified    Statin intolerance    Hyperlipidemia, unspecified hyperlipidemia type    History of TIA (transient ischemic attack)    Weakness of lower extremity, unspecified laterality    Screening for colon cancer    Fatty liver    Pure hypercholesterolemia    Snoring    Daytime somnolence      Plan: Diabetes-recent home readings looking good.  Continue Janumet 50/1000 mg twice daily, continue glyburide 1.25 mg daily, continue glucose monitoring, daily foot checks.  We will request copy of recent eye doctor report  Hypertension-continue Lisinopril 30 mg daily, Diltiazem 184m daily.  I see metoprolol and coreg listed today.  I will reach out to cardiology to verify which beta blocker she should continue.  She seems to have been on Coreg longer, so this may be an oversight.   Addendum: cardiology verified coreg.  She was given 1 dose of metoprolol recently for procedure, she is not on metoprolol regularly.   Dyslipidemia-continue Zetia 10 mg daily, pravastatin.  She has a history of statin intolerance  Hypothyroidism-continue levothyroxine Synthroid 75 mcg daily, updated labs today  Paroxysmal atrial fibrillation-she is in A. fib today without any symptoms.  I suspect she has had paroxysms of A. fib recently.  She sees cardiology and I will contact them about her finding today so they can follow-up with her.  She continues on Eliquis 5 mg twice daily, aspirin 81 mg daily  Chronic back  pain, lower extremity weakness-using cane currently, referral to local specialist.  [place]  We will request a copy of last MRI from possibly 2 years ago  I completed a handicap placard for her today  Fatty liver disease-discussed the need for weight loss, healthy diet and exercise.  It may be time to update ultrasound.  I reviewed her December 2021 mammogram that was normal  Thyroid nodule-we reviewed the biopsy results from 11/08/19.  She was referred to endocrinology for further discussion and evaluation.  I realize she saw him in January but I do not see any discussion about the nodule.  I will send endocrinology message about this  Snoring, Daytime somnolence, fatigue - referral for sleep study.  LFradelwas seen today for medication management.  Diagnoses and all orders for this visit:  Type 2 diabetes mellitus with other specified complication, without long-term current  use of insulin (HCC) -     CBC with Differential/Platelet -     Hemoglobin A1c  Essential hypertension, benign -     EKG 12-Lead  Hypertension associated with diabetes (HCC)  Hypothyroidism, unspecified type -     TSH  Chronic bilateral low back pain with sciatica, sciatica laterality unspecified  Statin intolerance  Hyperlipidemia, unspecified hyperlipidemia type  History of TIA (transient ischemic attack)  Weakness of lower extremity, unspecified laterality -     CBC with Differential/Platelet  Screening for colon cancer -     Ambulatory referral to Gastroenterology  Fatty liver  Pure hypercholesterolemia -     pravastatin (PRAVACHOL) 40 MG tablet; Take 1 tablet (40 mg total) by mouth every evening.  Snoring  Daytime somnolence  Spent 50 minutes face to face with patient in discussion of symptoms, evaluation, plan and recommendations.    Follow up: Pending labs

## 2020-02-04 NOTE — Telephone Encounter (Signed)
Hello Dr. Stanford Breed  I saw Mrs. Hussar today for routine med check.    2 issues:  She is in afib today asymptomatic other than some recent feelings of needing to take a deep breath at times  Also, I see coreg and metoprolol listed in med list today.  Looking back, she has been on coreg a while I think, but unless I can't see where this was added, the metoprolol may have been added late 2021 or refilled by you.  I wanted to make sure that you were aware that 2 beta blockers were listed.   I have refilled some of her BP medications as she established with me this past year so she didn't run out of medications.   When my nurse triaged today, she did not have pill bottles, but gave [place] the impression she was taking both beta blockers as well as her other BP medications.  I assume she should only be on Coreg but wanted to confirm with you.  Thanks Dorothea Ogle, PA-C

## 2020-02-04 NOTE — Telephone Encounter (Signed)
Please check and see how much coreg and metoprolol she is taking Kirk Ruths

## 2020-02-04 NOTE — Telephone Encounter (Signed)
Hello Dr. Loanne Drilling, I recently referred Sandra Ruiz to your office.  I wanted to make sure you saw her recent thyroid biopsy notes.  This was part of the recent referral , but I can't tell if you all discussed the recent findings.   Thanks Dorothea Ogle, PA-C

## 2020-02-05 ENCOUNTER — Other Ambulatory Visit: Payer: Self-pay

## 2020-02-05 DIAGNOSIS — M5441 Lumbago with sciatica, right side: Secondary | ICD-10-CM

## 2020-02-05 DIAGNOSIS — G8929 Other chronic pain: Secondary | ICD-10-CM

## 2020-02-05 LAB — CBC WITH DIFFERENTIAL/PLATELET
Basophils Absolute: 0.1 10*3/uL (ref 0.0–0.2)
Basos: 1 %
EOS (ABSOLUTE): 0.2 10*3/uL (ref 0.0–0.4)
Eos: 2 %
Hematocrit: 34.4 % (ref 34.0–46.6)
Hemoglobin: 10.6 g/dL — ABNORMAL LOW (ref 11.1–15.9)
Immature Grans (Abs): 0 10*3/uL (ref 0.0–0.1)
Immature Granulocytes: 0 %
Lymphocytes Absolute: 1.7 10*3/uL (ref 0.7–3.1)
Lymphs: 23 %
MCH: 25.7 pg — ABNORMAL LOW (ref 26.6–33.0)
MCHC: 30.8 g/dL — ABNORMAL LOW (ref 31.5–35.7)
MCV: 83 fL (ref 79–97)
Monocytes Absolute: 0.5 10*3/uL (ref 0.1–0.9)
Monocytes: 7 %
Neutrophils Absolute: 4.9 10*3/uL (ref 1.4–7.0)
Neutrophils: 67 %
Platelets: 337 10*3/uL (ref 150–450)
RBC: 4.13 x10E6/uL (ref 3.77–5.28)
RDW: 13.7 % (ref 11.7–15.4)
WBC: 7.4 10*3/uL (ref 3.4–10.8)

## 2020-02-05 LAB — HEMOGLOBIN A1C
Est. average glucose Bld gHb Est-mCnc: 134 mg/dL
Hgb A1c MFr Bld: 6.3 % — ABNORMAL HIGH (ref 4.8–5.6)

## 2020-02-05 LAB — TSH: TSH: 2.01 u[IU]/mL (ref 0.450–4.500)

## 2020-02-05 NOTE — Telephone Encounter (Signed)
Mainly the thyroid disease and biopsy.  She has diabetes as well, but seems to be doing ok in this regard.  Thanks

## 2020-02-05 NOTE — Patient Instructions (Signed)
Diabetes-recent home readings looking good.  Continue Janumet 50/1000 mg twice daily, continue glyburide 1.25 mg daily, continue glucose monitoring, daily foot checks.  We will request copy of recent eye doctor report  Hypertension-continue Lisinopril 30 mg daily, Diltiazem 180mg  daily.  I see metoprolol and coreg listed today.  I will reach out to cardiology to verify which beta blocker she should continue.  She seems to have been on Coreg longer, so this may be an oversight.   Addendum: cardiology verified coreg.  She was given 1 dose of metoprolol recently for procedure, she is not on metoprolol regularly.   Dyslipidemia-continue Zetia 10 mg daily, pravastatin.  She has a history of statin intolerance  Hypothyroidism-continue levothyroxine Synthroid 75 mcg daily, updated labs today  Paroxysmal atrial fibrillation-she is in A. fib today without any symptoms.  I suspect she has had paroxysms of A. fib recently.  She sees cardiology and I will contact them about her finding today so they can follow-up with her.  She continues on Eliquis 5 mg twice daily, aspirin 81 mg daily  Chronic back pain, lower extremity weakness-using cane currently, referral to local specialist.  She was seeing pain management and neurosurgery in [place] before she moved on her this past year.  We will request a copy of last MRI from possibly 2 years ago  I completed a handicap placard for her today  Fatty liver disease-discussed the need for weight loss, healthy diet and exercise.  It may be time to update ultrasound.  I reviewed her December 2021 mammogram that was normal  Thyroid nodule-we reviewed the biopsy results from 11/08/19.  She was referred to endocrinology for further discussion and evaluation.  I realize she saw him in January but I do not see any discussion about the nodule.  I will send endocrinology message about this

## 2020-02-05 NOTE — Telephone Encounter (Signed)
Called and spoke with patient, she is aware of appt 02/07/20 at 9:30 am with Adline Peals, PA.

## 2020-02-05 NOTE — Telephone Encounter (Signed)
I got the cytology and afirma.  If there is anything else?  Thanks.

## 2020-02-05 NOTE — Telephone Encounter (Signed)
Done

## 2020-02-06 ENCOUNTER — Ambulatory Visit
Admission: RE | Admit: 2020-02-06 | Discharge: 2020-02-06 | Disposition: A | Discharge status: Home / Self Care | Payer: Medicare HMO | Source: Ambulatory Visit | Attending: Medical | Admitting: Medical

## 2020-02-06 ENCOUNTER — Other Ambulatory Visit: Payer: Self-pay | Admitting: Medical

## 2020-02-06 ENCOUNTER — Other Ambulatory Visit: Payer: Self-pay

## 2020-02-06 ENCOUNTER — Encounter: Payer: Self-pay | Admitting: Medical

## 2020-02-06 DIAGNOSIS — Z780 Asymptomatic menopausal state: Secondary | ICD-10-CM | POA: Diagnosis not present

## 2020-02-06 DIAGNOSIS — E2839 Other primary ovarian failure: Secondary | ICD-10-CM

## 2020-02-06 MED ORDER — FERROUS GLUCONATE 324 (38 FE) MG PO TABS: 324.0000 mg | ORAL_TABLET | Freq: Every day | ORAL | 2 refills | Status: DC

## 2020-02-06 NOTE — Telephone Encounter (Signed)
Yes, I got the bx cytol and DNA.  Thanks.

## 2020-02-06 NOTE — H&P (View-Only) (Signed)
Primary Care Physician: [name], PA-C Primary Cardiologist: [name] Primary Electrophysiologist: none Referring Physician: [name] is a 75 y.o. female with a history of atrial fibrillation s/p ablation x 2 in [place], HLD, OSA, HTN, DM who presents for consultation in the [facility]. The patient was initially diagnosed with atrial fibrillation in 2019 and had two ablations that year before moving here from [place]. Patient is on Eliquis for a CHADS2VASC score of 4. She has not had recurrent issues with afib until about one month ago when she started noticing increased fatigue and SOB with exertion. ECG today shows rate controlled afib. She denies any bleeding issues on anticoagulation. She does have a diagnosis of OSA and is not on CPAP.  Today, she denies symptoms of palpitations, chest pain, orthopnea, PND, lower extremity edema, dizziness, presyncope, syncope, bleeding, or neurologic sequela. The patient is tolerating medications without difficulties and is otherwise without complaint today.    Atrial Fibrillation Risk Factors:  she does have symptoms or diagnosis of sleep apnea. she is not compliant with CPAP therapy. she does not have a history of rheumatic fever. she does not have a history of alcohol use. The patient does not have a history of early familial atrial fibrillation or other arrhythmias.  she has a BMI of Body mass index is 40.68 kg/m.Marland Kitchen Filed Weights   02/07/20 0929  Weight: 100.9 kg    Family History  Problem Relation Age of Onset  . CAD Mother   . Thyroid disease Mother   . CAD Father   . Hypothyroidism Daughter      Atrial Fibrillation Management history:  Previous antiarrhythmic drugs: none Previous cardioversions: none Previous ablations: 2019 x 2 in [place] score: 4 Anticoagulation history: Eliquis   Past Medical History:  Diagnosis Date  . Atrial fibrillation  ([place])   . Chronic pain   . Depression   . Diabetes mellitus without complication ([place])   . GERD (gastroesophageal reflux disease)   . History of TIA (transient ischemic attack)   . Hyperlipidemia   . Hypertension   . Lyme disease   . Thyroid disease    Past Surgical History:  Procedure Laterality Date  . ATRIAL FIBRILLATION ABLATION    . CARDIAC CATHETERIZATION    . CERVICAL SPINE SURGERY    . LUMBAR SPINE SURGERY    . TONSILLECTOMY AND ADENOIDECTOMY      Current Outpatient Medications  Medication Sig Dispense Refill  . ACCU-CHEK GUIDE test strip TEST 1 - 2 TIMES DAILY 200 strip 0  . Accu-Chek Softclix Lancets lancets CHECK BLOOD SUGAR ONE TO TWO TIMES DAILY. 200 each 0  . ALPRAZolam (XANAX) 0.25 MG tablet Take 1 tablet (0.25 mg total) by mouth 2 (two) times daily as needed for anxiety. 20 tablet 0  . apixaban (ELIQUIS) 5 MG TABS tablet Take 1 tablet (5 mg total) by mouth 2 (two) times daily. 180 tablet 1  . aspirin EC 81 MG tablet Take 1 tablet (81 mg total) by mouth daily. Swallow whole. 90 tablet 3  . Blood Glucose Monitoring Suppl (ACCU-CHEK GUIDE ME) w/Device KIT Test 1-2 times daily 1 kit 0  . carvedilol (COREG) 25 MG tablet TAKE 1 TABLET (25 MG TOTAL) BY MOUTH 2 (TWO) TIMES DAILY WITH A MEAL. 180 tablet 0  . diltiazem (DILACOR XR) 180 MG 24 hr capsule Take 1 capsule (180 mg total) by mouth daily. 90 capsule 3  .  DULoxetine (CYMBALTA) 60 MG capsule TAKE 1 CAPSULE (60 MG TOTAL) BY MOUTH DAILY. 90 capsule 1  . ezetimibe (ZETIA) 10 MG tablet Take 1 tablet (10 mg total) by mouth daily. 90 tablet 3  . ferrous gluconate (FERGON) 324 MG tablet Take 1 tablet (324 mg total) by mouth daily with breakfast. 30 tablet 2  . folic acid (FOLVITE) 1 MG tablet Take 1 tablet (1 mg total) by mouth daily. 90 tablet 1  . gabapentin (NEURONTIN) 800 MG tablet Take 1 tablet (800 mg total) by mouth 3 (three) times daily. 270 tablet 1  . glyBURIDE (DIABETA) 1.25 MG tablet Take 1 tablet (1.25 mg total)  by mouth every morning. 90 tablet 3  . JANUMET 50-1000 MG tablet TAKE 1 TABLET BY MOUTH 2 (TWO) TIMES DAILY WITH A MEAL. 180 tablet 0  . levothyroxine (SYNTHROID) 75 MCG tablet TAKE 1 TABLET EVERY DAY BEFORE BREAKFAST 90 tablet 0  . lisinopril (ZESTRIL) 30 MG tablet Take 1 tablet (30 mg total) by mouth daily. 90 tablet 3  . methocarbamol (ROBAXIN) 500 MG tablet TAKE 1 TABLET TWICE DAILY 180 tablet 0  . omeprazole (PRILOSEC) 20 MG capsule TAKE 1 CAPSULE BY MOUTH EVERY DAY 90 capsule 2  . oxybutynin (DITROPAN-XL) 10 MG 24 hr tablet Take 1 tablet (10 mg total) by mouth 2 (two) times daily. 180 tablet 3  . pravastatin (PRAVACHOL) 40 MG tablet Take 1 tablet (40 mg total) by mouth every evening. 90 tablet 3   No current facility-administered medications for this encounter.    Allergies  Allergen Reactions  . Baclofen Swelling  . Crestor [Rosuvastatin]     Extreme myalgias  . Lipitor [Atorvastatin]     Extreme myalgias  . Silicone   . Verapamil Swelling    Social History   Socioeconomic History  . Marital status: Married    Spouse name: Not on file  . Number of children: Not on file  . Years of education: Not on file  . Highest education level: Not on file  Occupational History  . Not on file  Tobacco Use  . Smoking status: Former Research scientist (life sciences)  . Smokeless tobacco: Never Used  Substance and Sexual Activity  . Alcohol use: Yes    Alcohol/week: 2.0 standard drinks    Types: 2 Standard drinks or equivalent per week    Comment: mix drinks occ.  . Drug use: Never  . Sexual activity: Not on file  Other Topics Concern  . Not on file  Social History Narrative   From [place], married, [place] Witness, new from [place] to [place]  07/2019   Social Determinants of Health   Financial Resource Strain: Not on file  Food Insecurity: Not on file  Transportation Needs: Not on file  Physical Activity: Not on file  Stress: Not on file  Social Connections: Not on file   Intimate Partner Violence: Not on file     ROS- All systems are reviewed and negative except as per the HPI above.  Physical Exam: Vitals:   02/07/20 0929  BP: 102/70  Pulse: 80  Weight: 100.9 kg  Height: 5' 2"  (1.575 m)    GEN- The patient is well appearing obese female, alert and oriented x 3 today.   Head- normocephalic, atraumatic Eyes-  Sclera clear, conjunctiva pink Ears- hearing intact Oropharynx- clear Neck- supple  Lungs- Clear to ausculation bilaterally, normal work of breathing Heart- irregular rate and rhythm, no murmurs, rubs or gallops  GI- soft, NT,  ND, + BS Extremities- no clubbing, cyanosis, or edema MS- no significant deformity or atrophy Skin- no rash or lesion Psych- euthymic mood, full affect Neuro- strength and sensation are intact  Wt Readings from Last 3 Encounters:  02/07/20 100.9 kg  02/04/20 101.1 kg  01/08/20 99.8 kg    EKG today demonstrates  Afib Vent. rate 80 BPM QRS duration 84 ms QT/QTc 358/412 ms  Echo 06/21/19 demonstrated  1. Left ventricular ejection fraction, by estimation, is 65 to 70%. The  left ventricle has normal function. The left ventricle has no regional  wall motion abnormalities. There is mild concentric left ventricular  hypertrophy. Left ventricular diastolic  parameters are consistent with Grade I diastolic dysfunction (impaired  relaxation). The average left ventricular global longitudinal strain is  -20.9 %. The global longitudinal strain is normal.  2. Right ventricular systolic function is normal. The right ventricular  size is normal. Tricuspid regurgitation signal is inadequate for assessing  PA pressure.  3. The mitral valve is degenerative. No evidence of mitral valve  regurgitation. No evidence of mitral stenosis.  4. The aortic valve is tricuspid. Aortic valve regurgitation is not  visualized. Mild aortic valve sclerosis is present, with no evidence of  aortic valve stenosis.  5. The inferior  vena cava is normal in size with greater than 50%  respiratory variability, suggesting right atrial pressure of 3 mmHg.   Epic records are reviewed at length today  CHA2DS2-VASc Score = 4  The patient's score is based upon: CHF History: No HTN History: Yes Diabetes History: Yes Stroke History: No Vascular Disease History: No Age Score: 1 Gender Score: 1      ASSESSMENT AND PLAN: 1. Persistent Atrial Fibrillation (ICD10:  I48.19) The patient's CHA2DS2-VASc score is 4, indicating a 4.8% annual risk of stroke.   S/p afib ablation in [place] x 2 2019. We discussed therapeutic options today including AAD and DCCV. will plan for DCCV alone since she has had no afib for 2-3 years.  Check bmet/cbc Continue Coreg 25 mg BID Continue diltiazem 180 mg daily Continue Eliquis 5 mg BID She had an ILR placed in 2019, unsure if it is still active. Patient would like to establish with EP/device clinic for monitoring. If her battery is dead, she is very interested in having a new ILR placed. Will refer her to EP.   2. Secondary Hypercoagulable State (ICD10:  D68.69) The patient is at significant risk for stroke/thromboembolism based upon her CHA2DS2-VASc Score of 4.  Continue Apixaban (Eliquis).   3. Obesity Body mass index is 40.68 kg/m. Lifestyle modification was discussed at length including regular exercise and weight reduction.  4. OSA The importance of adequate treatment of sleep apnea was discussed today in order to improve our ability to maintain sinus rhythm long term. Patient has appt to establish with neurology.   5. HTN Stable, no changes today.   Follow up with EP to establish care and consider new ILR.    [facility] [address] [place], [place] [address] [phone] 02/07/2020 10:41 AM

## 2020-02-06 NOTE — Progress Notes (Signed)
Primary Care Physician: [name], PA-C Primary Cardiologist: [name] Primary Electrophysiologist: none Referring Physician: [name] is a 75 y.o. female with a history of atrial fibrillation s/p ablation x 2 in [place], HLD, OSA, HTN, DM who presents for consultation in the [facility]. The patient was initially diagnosed with atrial fibrillation in 2019 and had two ablations that year before moving here from [place]. Patient is on Eliquis for a CHADS2VASC score of 4. She has not had recurrent issues with afib until about one month ago when she started noticing increased fatigue and SOB with exertion. ECG today shows rate controlled afib. She denies any bleeding issues on anticoagulation. She does have a diagnosis of OSA and is not on CPAP.  Today, she denies symptoms of palpitations, chest pain, orthopnea, PND, lower extremity edema, dizziness, presyncope, syncope, bleeding, or neurologic sequela. The patient is tolerating medications without difficulties and is otherwise without complaint today.    Atrial Fibrillation Risk Factors:  she does have symptoms or diagnosis of sleep apnea. she is not compliant with CPAP therapy. she does not have a history of rheumatic fever. she does not have a history of alcohol use. The patient does not have a history of early familial atrial fibrillation or other arrhythmias.  she has a BMI of Body mass index is 40.68 kg/m.Marland Kitchen Filed Weights   02/07/20 0929  Weight: 100.9 kg    Family History  Problem Relation Age of Onset  . CAD Mother   . Thyroid disease Mother   . CAD Father   . Hypothyroidism Daughter      Atrial Fibrillation Management history:  Previous antiarrhythmic drugs: none Previous cardioversions: none Previous ablations: 2019 x 2 in [place] score: 4 Anticoagulation history: Eliquis   Past Medical History:  Diagnosis Date  . Atrial fibrillation  ([place])   . Chronic pain   . Depression   . Diabetes mellitus without complication ([place])   . GERD (gastroesophageal reflux disease)   . History of TIA (transient ischemic attack)   . Hyperlipidemia   . Hypertension   . Lyme disease   . Thyroid disease    Past Surgical History:  Procedure Laterality Date  . ATRIAL FIBRILLATION ABLATION    . CARDIAC CATHETERIZATION    . CERVICAL SPINE SURGERY    . LUMBAR SPINE SURGERY    . TONSILLECTOMY AND ADENOIDECTOMY      Current Outpatient Medications  Medication Sig Dispense Refill  . ACCU-CHEK GUIDE test strip TEST 1 - 2 TIMES DAILY 200 strip 0  . Accu-Chek Softclix Lancets lancets CHECK BLOOD SUGAR ONE TO TWO TIMES DAILY. 200 each 0  . ALPRAZolam (XANAX) 0.25 MG tablet Take 1 tablet (0.25 mg total) by mouth 2 (two) times daily as needed for anxiety. 20 tablet 0  . apixaban (ELIQUIS) 5 MG TABS tablet Take 1 tablet (5 mg total) by mouth 2 (two) times daily. 180 tablet 1  . aspirin EC 81 MG tablet Take 1 tablet (81 mg total) by mouth daily. Swallow whole. 90 tablet 3  . Blood Glucose Monitoring Suppl (ACCU-CHEK GUIDE ME) w/Device KIT Test 1-2 times daily 1 kit 0  . carvedilol (COREG) 25 MG tablet TAKE 1 TABLET (25 MG TOTAL) BY MOUTH 2 (TWO) TIMES DAILY WITH A MEAL. 180 tablet 0  . diltiazem (DILACOR XR) 180 MG 24 hr capsule Take 1 capsule (180 mg total) by mouth daily. 90 capsule 3  .  DULoxetine (CYMBALTA) 60 MG capsule TAKE 1 CAPSULE (60 MG TOTAL) BY MOUTH DAILY. 90 capsule 1  . ezetimibe (ZETIA) 10 MG tablet Take 1 tablet (10 mg total) by mouth daily. 90 tablet 3  . ferrous gluconate (FERGON) 324 MG tablet Take 1 tablet (324 mg total) by mouth daily with breakfast. 30 tablet 2  . folic acid (FOLVITE) 1 MG tablet Take 1 tablet (1 mg total) by mouth daily. 90 tablet 1  . gabapentin (NEURONTIN) 800 MG tablet Take 1 tablet (800 mg total) by mouth 3 (three) times daily. 270 tablet 1  . glyBURIDE (DIABETA) 1.25 MG tablet Take 1 tablet (1.25 mg total)  by mouth every morning. 90 tablet 3  . JANUMET 50-1000 MG tablet TAKE 1 TABLET BY MOUTH 2 (TWO) TIMES DAILY WITH A MEAL. 180 tablet 0  . levothyroxine (SYNTHROID) 75 MCG tablet TAKE 1 TABLET EVERY DAY BEFORE BREAKFAST 90 tablet 0  . lisinopril (ZESTRIL) 30 MG tablet Take 1 tablet (30 mg total) by mouth daily. 90 tablet 3  . methocarbamol (ROBAXIN) 500 MG tablet TAKE 1 TABLET TWICE DAILY 180 tablet 0  . omeprazole (PRILOSEC) 20 MG capsule TAKE 1 CAPSULE BY MOUTH EVERY DAY 90 capsule 2  . oxybutynin (DITROPAN-XL) 10 MG 24 hr tablet Take 1 tablet (10 mg total) by mouth 2 (two) times daily. 180 tablet 3  . pravastatin (PRAVACHOL) 40 MG tablet Take 1 tablet (40 mg total) by mouth every evening. 90 tablet 3   No current facility-administered medications for this encounter.    Allergies  Allergen Reactions  . Baclofen Swelling  . Crestor [Rosuvastatin]     Extreme myalgias  . Lipitor [Atorvastatin]     Extreme myalgias  . Silicone   . Verapamil Swelling    Social History   Socioeconomic History  . Marital status: Married    Spouse name: Not on file  . Number of children: Not on file  . Years of education: Not on file  . Highest education level: Not on file  Occupational History  . Not on file  Tobacco Use  . Smoking status: Former Research scientist (life sciences)  . Smokeless tobacco: Never Used  Substance and Sexual Activity  . Alcohol use: Yes    Alcohol/week: 2.0 standard drinks    Types: 2 Standard drinks or equivalent per week    Comment: mix drinks occ.  . Drug use: Never  . Sexual activity: Not on file  Other Topics Concern  . Not on file  Social History Narrative   From [place], married, [place] Witness, new from [place] to [place]  07/2019   Social Determinants of Health   Financial Resource Strain: Not on file  Food Insecurity: Not on file  Transportation Needs: Not on file  Physical Activity: Not on file  Stress: Not on file  Social Connections: Not on file   Intimate Partner Violence: Not on file     ROS- All systems are reviewed and negative except as per the HPI above.  Physical Exam: Vitals:   02/07/20 0929  BP: 102/70  Pulse: 80  Weight: 100.9 kg  Height: 5' 2"  (1.575 m)    GEN- The patient is well appearing obese female, alert and oriented x 3 today.   Head- normocephalic, atraumatic Eyes-  Sclera clear, conjunctiva pink Ears- hearing intact Oropharynx- clear Neck- supple  Lungs- Clear to ausculation bilaterally, normal work of breathing Heart- irregular rate and rhythm, no murmurs, rubs or gallops  GI- soft, NT,  ND, + BS Extremities- no clubbing, cyanosis, or edema MS- no significant deformity or atrophy Skin- no rash or lesion Psych- euthymic mood, full affect Neuro- strength and sensation are intact  Wt Readings from Last 3 Encounters:  02/07/20 100.9 kg  02/04/20 101.1 kg  01/08/20 99.8 kg    EKG today demonstrates  Afib Vent. rate 80 BPM QRS duration 84 ms QT/QTc 358/412 ms  Echo 06/21/19 demonstrated  1. Left ventricular ejection fraction, by estimation, is 65 to 70%. The  left ventricle has normal function. The left ventricle has no regional  wall motion abnormalities. There is mild concentric left ventricular  hypertrophy. Left ventricular diastolic  parameters are consistent with Grade I diastolic dysfunction (impaired  relaxation). The average left ventricular global longitudinal strain is  -20.9 %. The global longitudinal strain is normal.  2. Right ventricular systolic function is normal. The right ventricular  size is normal. Tricuspid regurgitation signal is inadequate for assessing  PA pressure.  3. The mitral valve is degenerative. No evidence of mitral valve  regurgitation. No evidence of mitral stenosis.  4. The aortic valve is tricuspid. Aortic valve regurgitation is not  visualized. Mild aortic valve sclerosis is present, with no evidence of  aortic valve stenosis.  5. The inferior  vena cava is normal in size with greater than 50%  respiratory variability, suggesting right atrial pressure of 3 mmHg.   Epic records are reviewed at length today  CHA2DS2-VASc Score = 4  The patient's score is based upon: CHF History: No HTN History: Yes Diabetes History: Yes Stroke History: No Vascular Disease History: No Age Score: 1 Gender Score: 1      ASSESSMENT AND PLAN: 1. Persistent Atrial Fibrillation (ICD10:  I48.19) The patient's CHA2DS2-VASc score is 4, indicating a 4.8% annual risk of stroke.   S/p afib ablation in [place] x 2 2019. We discussed therapeutic options today including AAD and DCCV. will plan for DCCV alone since she has had no afib for 2-3 years.  Check bmet/cbc Continue Coreg 25 mg BID Continue diltiazem 180 mg daily Continue Eliquis 5 mg BID She had an ILR placed in 2019, unsure if it is still active. Patient would like to establish with EP/device clinic for monitoring. If her battery is dead, she is very interested in having a new ILR placed. Will refer her to EP.   2. Secondary Hypercoagulable State (ICD10:  D68.69) The patient is at significant risk for stroke/thromboembolism based upon her CHA2DS2-VASc Score of 4.  Continue Apixaban (Eliquis).   3. Obesity Body mass index is 40.68 kg/m. Lifestyle modification was discussed at length including regular exercise and weight reduction.  4. OSA The importance of adequate treatment of sleep apnea was discussed today in order to improve our ability to maintain sinus rhythm long term. Patient has appt to establish with neurology.   5. HTN Stable, no changes today.   Follow up with EP to establish care and consider new ILR.    [facility] [address] [place], [place] [address] [phone] 02/07/2020 10:41 AM

## 2020-02-07 ENCOUNTER — Encounter (HOSPITAL_COMMUNITY): Payer: Self-pay | Admitting: Physician Assistant

## 2020-02-07 ENCOUNTER — Ambulatory Visit (HOSPITAL_COMMUNITY)
Admission: RE | Admit: 2020-02-07 | Discharge: 2020-02-07 | Disposition: A | Discharge status: Home / Self Care | Payer: Medicare HMO | Source: Ambulatory Visit | Attending: Physician Assistant | Admitting: Physician Assistant

## 2020-02-07 VITALS — BP 102/70 | HR 80 | Ht 62.0 in | Wt 222.4 lb

## 2020-02-07 DIAGNOSIS — D6869 Other thrombophilia: Secondary | ICD-10-CM | POA: Insufficient documentation

## 2020-02-07 DIAGNOSIS — Z6841 Body Mass Index (BMI) 40.0 and over, adult: Secondary | ICD-10-CM | POA: Insufficient documentation

## 2020-02-07 DIAGNOSIS — E785 Hyperlipidemia, unspecified: Secondary | ICD-10-CM | POA: Insufficient documentation

## 2020-02-07 DIAGNOSIS — I4819 Other persistent atrial fibrillation: Secondary | ICD-10-CM | POA: Insufficient documentation

## 2020-02-07 DIAGNOSIS — I10 Essential (primary) hypertension: Secondary | ICD-10-CM | POA: Diagnosis not present

## 2020-02-07 DIAGNOSIS — Z87891 Personal history of nicotine dependence: Secondary | ICD-10-CM | POA: Insufficient documentation

## 2020-02-07 DIAGNOSIS — Z79890 Hormone replacement therapy (postmenopausal): Secondary | ICD-10-CM | POA: Insufficient documentation

## 2020-02-07 DIAGNOSIS — Z8249 Family history of ischemic heart disease and other diseases of the circulatory system: Secondary | ICD-10-CM | POA: Diagnosis not present

## 2020-02-07 DIAGNOSIS — Z888 Allergy status to other drugs, medicaments and biological substances status: Secondary | ICD-10-CM | POA: Diagnosis not present

## 2020-02-07 DIAGNOSIS — Z7982 Long term (current) use of aspirin: Secondary | ICD-10-CM | POA: Insufficient documentation

## 2020-02-07 DIAGNOSIS — Z79899 Other long term (current) drug therapy: Secondary | ICD-10-CM | POA: Diagnosis not present

## 2020-02-07 DIAGNOSIS — Z7901 Long term (current) use of anticoagulants: Secondary | ICD-10-CM | POA: Diagnosis not present

## 2020-02-07 DIAGNOSIS — E669 Obesity, unspecified: Secondary | ICD-10-CM | POA: Diagnosis not present

## 2020-02-07 DIAGNOSIS — G4733 Obstructive sleep apnea (adult) (pediatric): Secondary | ICD-10-CM | POA: Diagnosis not present

## 2020-02-07 DIAGNOSIS — E119 Type 2 diabetes mellitus without complications: Secondary | ICD-10-CM | POA: Diagnosis not present

## 2020-02-07 DIAGNOSIS — Z7984 Long term (current) use of oral hypoglycemic drugs: Secondary | ICD-10-CM | POA: Diagnosis not present

## 2020-02-07 LAB — CBC
HCT: 32.4 % — ABNORMAL LOW (ref 36.0–46.0)
Hemoglobin: 10.1 g/dL — ABNORMAL LOW (ref 12.0–15.0)
MCH: 26.9 pg (ref 26.0–34.0)
MCHC: 31.2 g/dL (ref 30.0–36.0)
MCV: 86.4 fL (ref 80.0–100.0)
Platelets: 236 10*3/uL (ref 150–400)
RBC: 3.75 MIL/uL — ABNORMAL LOW (ref 3.87–5.11)
RDW: 14.4 % (ref 11.5–15.5)
WBC: 5.9 10*3/uL (ref 4.0–10.5)
nRBC: 0 % (ref 0.0–0.2)

## 2020-02-07 LAB — BASIC METABOLIC PANEL
Anion gap: 11 (ref 5–15)
BUN: 16 mg/dL (ref 8–23)
CO2: 22 mmol/L (ref 22–32)
Calcium: 9 mg/dL (ref 8.9–10.3)
Chloride: 106 mmol/L (ref 98–111)
Creatinine, Ser: 0.82 mg/dL (ref 0.44–1.00)
GFR, Estimated: >60 mL/min (ref >60)
Glucose, Bld: 109 mg/dL — ABNORMAL HIGH (ref 70–99)
Potassium: 4.8 mmol/L (ref 3.5–5.1)
Sodium: 139 mmol/L (ref 135–145)

## 2020-02-07 NOTE — Progress Notes (Signed)
Done and gave to [facility] for medical records

## 2020-02-07 NOTE — Patient Instructions (Addendum)
Cardioversion scheduled for Thursday, February 10th  - Arrive at the [facility] and go to admitting at [facility] not eat or drink anything after midnight the night prior to your procedure.  - Take all your morning medication (except diabetic medications) with a sip of water prior to arrival.  - You will not be able to drive home after your procedure.  - Do NOT miss any doses of your blood thinner - if you should miss a dose please notify our office immediately.  - If you feel as if you go back into normal rhythm prior to scheduled cardioversion, please notify our office immediately. If your procedure is canceled in the cardioversion suite you will be charged a cancellation fee.  Follow up with EP - their office will call you

## 2020-02-07 NOTE — Addendum Note (Signed)
Addended by: [name] on: 02/07/2020 09:28 PM   Modules accepted: Orders

## 2020-02-12 ENCOUNTER — Other Ambulatory Visit (HOSPITAL_COMMUNITY)
Admission: RE | Admit: 2020-02-12 | Discharge: 2020-02-12 | Disposition: A | Discharge status: Home / Self Care | Payer: Medicare HMO | Source: Ambulatory Visit | Attending: Cardiology | Admitting: Cardiology

## 2020-02-12 DIAGNOSIS — Z20822 Contact with and (suspected) exposure to covid-19: Secondary | ICD-10-CM | POA: Diagnosis not present

## 2020-02-12 DIAGNOSIS — Z01812 Encounter for preprocedural laboratory examination: Secondary | ICD-10-CM | POA: Diagnosis not present

## 2020-02-12 LAB — SARS CORONAVIRUS 2 (TAT 6-24 HRS): SARS Coronavirus 2: NEGATIVE

## 2020-02-13 NOTE — Progress Notes (Signed)
Pre call done for endo procedure tomorrow 02/14/20.Patient states she has been quarantined since covid test, has not missed any doses of her blood thinner and will take morning of procedure with sip of water, and she confirmed she has a ride home. All questions addressed.

## 2020-02-14 ENCOUNTER — Encounter (HOSPITAL_COMMUNITY)
Admission: RE | Disposition: A | Discharge status: Home / Self Care | Payer: Self-pay | Source: Home / Self Care | Attending: Cardiology

## 2020-02-14 ENCOUNTER — Ambulatory Visit (HOSPITAL_COMMUNITY)
Admission: RE | Admit: 2020-02-14 | Discharge: 2020-02-14 | Disposition: A | Discharge status: Home / Self Care | Payer: Medicare HMO | Attending: Cardiology | Admitting: Cardiology

## 2020-02-14 DIAGNOSIS — Z7901 Long term (current) use of anticoagulants: Secondary | ICD-10-CM | POA: Insufficient documentation

## 2020-02-14 DIAGNOSIS — Z7982 Long term (current) use of aspirin: Secondary | ICD-10-CM | POA: Diagnosis not present

## 2020-02-14 DIAGNOSIS — Z8349 Family history of other endocrine, nutritional and metabolic diseases: Secondary | ICD-10-CM | POA: Diagnosis not present

## 2020-02-14 DIAGNOSIS — Z9119 Patient's noncompliance with other medical treatment and regimen: Secondary | ICD-10-CM | POA: Diagnosis not present

## 2020-02-14 DIAGNOSIS — E669 Obesity, unspecified: Secondary | ICD-10-CM | POA: Diagnosis not present

## 2020-02-14 DIAGNOSIS — I10 Essential (primary) hypertension: Secondary | ICD-10-CM | POA: Diagnosis not present

## 2020-02-14 DIAGNOSIS — Z888 Allergy status to other drugs, medicaments and biological substances status: Secondary | ICD-10-CM | POA: Insufficient documentation

## 2020-02-14 DIAGNOSIS — Z95818 Presence of other cardiac implants and grafts: Secondary | ICD-10-CM | POA: Diagnosis not present

## 2020-02-14 DIAGNOSIS — G4733 Obstructive sleep apnea (adult) (pediatric): Secondary | ICD-10-CM | POA: Diagnosis not present

## 2020-02-14 DIAGNOSIS — Z87891 Personal history of nicotine dependence: Secondary | ICD-10-CM | POA: Diagnosis not present

## 2020-02-14 DIAGNOSIS — I4819 Other persistent atrial fibrillation: Secondary | ICD-10-CM | POA: Insufficient documentation

## 2020-02-14 DIAGNOSIS — D6869 Other thrombophilia: Secondary | ICD-10-CM | POA: Insufficient documentation

## 2020-02-14 DIAGNOSIS — Z8249 Family history of ischemic heart disease and other diseases of the circulatory system: Secondary | ICD-10-CM | POA: Diagnosis not present

## 2020-02-14 DIAGNOSIS — Z6841 Body Mass Index (BMI) 40.0 and over, adult: Secondary | ICD-10-CM | POA: Diagnosis not present

## 2020-02-14 DIAGNOSIS — Z79890 Hormone replacement therapy (postmenopausal): Secondary | ICD-10-CM | POA: Insufficient documentation

## 2020-02-14 DIAGNOSIS — Z79899 Other long term (current) drug therapy: Secondary | ICD-10-CM | POA: Diagnosis not present

## 2020-02-14 SURGERY — CANCELLED PROCEDURE

## 2020-02-14 NOTE — Interval H&P Note (Signed)
History and Physical Interval Note:  02/14/2020 10:45 AM  Sandra Ruiz  has presented today for surgery, with the diagnosis of AFIB.  The various methods of treatment have been discussed with the patient and family. After consideration of risks, benefits and other options for treatment, the patient has consented to  Procedure(s): CANCELLED PROCEDURE/NSR as a surgical intervention.  The patient's history has been reviewed, patient examined, no change in status, stable for surgery.  I have reviewed the patient's chart and labs.  Questions were answered to the patient's satisfaction.     Ena Dawley

## 2020-02-14 NOTE — Progress Notes (Signed)
Patient found to be in normal sinus rhythm on arrival for cardioversion.  Verified by Dr. Meda Coffee with EKG.  Patient discharged home with husband.  Vista Lawman, RN

## 2020-02-14 NOTE — CV Procedure (Signed)
The patient presented in sinus rhythm that was confirmed on 12 lead ECG, cardioversion was cancelled.   Sandra Dawley, MD 02/14/2020

## 2020-02-15 ENCOUNTER — Telehealth: Payer: Self-pay | Admitting: Medical

## 2020-02-15 NOTE — Telephone Encounter (Signed)
[facility] ophthalmology records received

## 2020-02-22 ENCOUNTER — Encounter: Payer: Self-pay | Admitting: Medical

## 2020-02-25 ENCOUNTER — Encounter: Payer: Self-pay | Admitting: Internal Medicine

## 2020-02-25 ENCOUNTER — Ambulatory Visit: Payer: Medicare HMO | Admitting: Internal Medicine

## 2020-02-25 ENCOUNTER — Other Ambulatory Visit: Payer: Self-pay

## 2020-02-25 ENCOUNTER — Telehealth: Payer: Self-pay

## 2020-02-25 VITALS — BP 106/60 | HR 91 | Ht 62.0 in | Wt 221.0 lb

## 2020-02-25 DIAGNOSIS — G4733 Obstructive sleep apnea (adult) (pediatric): Secondary | ICD-10-CM

## 2020-02-25 DIAGNOSIS — D6869 Other thrombophilia: Secondary | ICD-10-CM

## 2020-02-25 DIAGNOSIS — I480 Paroxysmal atrial fibrillation: Secondary | ICD-10-CM

## 2020-02-25 DIAGNOSIS — I10 Essential (primary) hypertension: Secondary | ICD-10-CM | POA: Diagnosis not present

## 2020-02-25 NOTE — Telephone Encounter (Signed)
Device Clinic Note:  Per MD request, called [facility] at [phone] to request release of  Remote transmission of loop recorder. Clinic close for the day. Request has been placed in [facility].

## 2020-02-25 NOTE — Progress Notes (Signed)
Electrophysiology Office Note   Date:  02/25/2020   ID:  Sandra, Ruiz Jun 24, 1945, MRN [id number]  PCP:  [name], PA-C  Cardiologist:  [name] Primary Electrophysiologist: Thompson Grayer, MD    CC: afib   History of Present Illness: Sandra Ruiz is a 75 y.o. female who presents today for electrophysiology evaluation.   She is referred by [name] and the AF clinic for EP consultation regarding afib. The patient has had afib since 2019.  She underwent ablation x 2 in [place].  She is chronically anticoagulated with eliquis.  She has done well post ablation off AAD therapy.  She had afib a month ago.  She was evaluated in the AF clinic (note reviewed) and was planned for [facility] but converted on her own to sinus rhythm. She has symptoms of fatigue and decreased exercise tolerance during afib. She has OSA but does not use CPAP. She had ILR placed in 2019. Today, she denies symptoms of palpitations, chest pain, shortness of breath, orthopnea, PND, lower extremity edema, claudication, dizziness, presyncope, syncope, bleeding, or neurologic sequela. The patient is tolerating medications without difficulties and is otherwise without complaint today.    Past Medical History:  Diagnosis Date  . Chronic pain   . Depression   . Diabetes mellitus without complication ([place])   . GERD (gastroesophageal reflux disease)   . History of TIA (transient ischemic attack)   . Hyperlipidemia   . Hyperopia of both eyes with astigmatism and presbyopia 12/21/2019  . Hypertension   . Lyme disease   . Paroxysmal atrial fibrillation ([facility])   . Thyroid disease    Past Surgical History:  Procedure Laterality Date  . ATRIAL FIBRILLATION ABLATION     x2 in [place]  . CARDIAC CATHETERIZATION    . CERVICAL SPINE SURGERY    . implantable loop recorder placement  11/07/2017   MDT LINQ1 implanted in [place] for afib management by Dr Candiss Norse  . LUMBAR SPINE SURGERY    . TONSILLECTOMY AND ADENOIDECTOMY        Current Outpatient Medications  Medication Sig Dispense Refill  . ACCU-CHEK GUIDE test strip TEST 1 - 2 TIMES DAILY 200 strip 0  . Accu-Chek Softclix Lancets lancets CHECK BLOOD SUGAR ONE TO TWO TIMES DAILY. 200 each 0  . ALPRAZolam (XANAX) 0.25 MG tablet Take 1 tablet (0.25 mg total) by mouth 2 (two) times daily as needed for anxiety. 20 tablet 0  . apixaban (ELIQUIS) 5 MG TABS tablet Take 1 tablet (5 mg total) by mouth 2 (two) times daily. 180 tablet 1  . aspirin EC 81 MG tablet Take 1 tablet (81 mg total) by mouth daily. Swallow whole. 90 tablet 3  . Blood Glucose Monitoring Suppl (ACCU-CHEK GUIDE ME) w/Device KIT Test 1-2 times daily 1 kit 0  . carvedilol (COREG) 25 MG tablet TAKE 1 TABLET (25 MG TOTAL) BY MOUTH 2 (TWO) TIMES DAILY WITH A MEAL. 180 tablet 0  . diltiazem (DILACOR XR) 180 MG 24 hr capsule Take 1 capsule (180 mg total) by mouth daily. 90 capsule 3  . DULoxetine (CYMBALTA) 60 MG capsule TAKE 1 CAPSULE (60 MG TOTAL) BY MOUTH DAILY. (Patient taking differently: Take 60 mg by mouth at bedtime.) 90 capsule 1  . ezetimibe (ZETIA) 10 MG tablet Take 1 tablet (10 mg total) by mouth daily. 90 tablet 3  . ferrous gluconate (FERGON) 324 MG tablet Take 1 tablet (324 mg total) by mouth daily with breakfast. 30 tablet 2  .  folic acid (FOLVITE) 1 MG tablet Take 1 tablet (1 mg total) by mouth daily. 90 tablet 1  . gabapentin (NEURONTIN) 800 MG tablet Take 1 tablet (800 mg total) by mouth 3 (three) times daily. 270 tablet 1  . glyBURIDE (DIABETA) 1.25 MG tablet Take 1 tablet (1.25 mg total) by mouth every morning. 90 tablet 3  . JANUMET 50-1000 MG tablet TAKE 1 TABLET BY MOUTH 2 (TWO) TIMES DAILY WITH A MEAL. (Patient taking differently: Take 1 tablet by mouth 2 (two) times daily with a meal.) 180 tablet 0  . levothyroxine (SYNTHROID) 75 MCG tablet TAKE 1 TABLET EVERY DAY BEFORE BREAKFAST (Patient taking differently: Take 75 mcg by mouth daily before breakfast.) 90 tablet 0  . lisinopril  (ZESTRIL) 30 MG tablet Take 1 tablet (30 mg total) by mouth daily. (Patient taking differently: Take 30 mg by mouth daily in the afternoon.) 90 tablet 3  . methocarbamol (ROBAXIN) 500 MG tablet TAKE 1 TABLET TWICE DAILY (Patient taking differently: Take 500 mg by mouth in the morning and at bedtime.) 180 tablet 0  . omeprazole (PRILOSEC) 20 MG capsule TAKE 1 CAPSULE BY MOUTH EVERY DAY (Patient taking differently: Take 20 mg by mouth daily.) 90 capsule 2  . oxybutynin (DITROPAN-XL) 10 MG 24 hr tablet Take 1 tablet (10 mg total) by mouth 2 (two) times daily. 180 tablet 3  . pravastatin (PRAVACHOL) 40 MG tablet Take 1 tablet (40 mg total) by mouth every evening. 90 tablet 3   No current facility-administered medications for this visit.    Allergies:   Baclofen, Crestor [rosuvastatin], Lipitor [atorvastatin], Verapamil, and Silicone   Social History:  The patient  reports that she has quit smoking. She has never used smokeless tobacco. She reports current alcohol use of about 2.0 standard drinks of alcohol per week. She reports that she does not use drugs.   Family History:  The patient's  family history includes CAD in her father and mother; Hypothyroidism in her daughter; Thyroid disease in her mother.    ROS:  Please see the history of present illness.   All other systems are personally reviewed and negative.    PHYSICAL EXAM: VS:  BP 106/60   Pulse 91   Ht 5' 2"  (1.575 m)   Wt 221 lb (100.2 kg)   SpO2 95%   BMI 40.42 kg/m  , BMI Body mass index is 40.42 kg/m. GEN: Well nourished, well developed, in no acute distress HEENT: normal Neck: no JVD, carotid bruits, or masses Cardiac: RRR; no murmurs, rubs, or gallops,no edema  Respiratory:  clear to auscultation bilaterally, normal work of breathing GI: soft, nontender, nondistended, + BS MS: no deformity or atrophy Skin: warm and dry  Neuro:  Strength and sensation are intact Psych: euthymic mood, full affect  EKG:  EKG is ordered  today. The ekg ordered today is personally reviewed and shows sinus   Recent Labs: 10/24/2019: ALT 26 02/04/2020: TSH 2.010 02/07/2020: BUN 16; Creatinine, Ser 0.82; Hemoglobin 10.1; Platelets 236; Potassium 4.8; Sodium 139  personally reviewed   Lipid Panel     Component Value Date/Time   CHOL 186 10/24/2019 1009   TRIG 142 10/24/2019 1009   HDL 44 10/24/2019 1009   CHOLHDL 4.2 10/24/2019 1009   LDLCALC 117 (H) 10/24/2019 1009   personally reviewed   Wt Readings from Last 3 Encounters:  02/25/20 221 lb (100.2 kg)  02/07/20 222 lb 6.4 oz (100.9 kg)  02/04/20 222 lb 12.8 oz (101.1 kg)  Other studies personally reviewed: Additional studies/ records that were reviewed today include: AF clinic records, ILR report  Review of the above records today demonstrates: as above   ASSESSMENT AND PLAN:  1.  Paroxysmal atrial fibrillation AF burden by ILR is 2%.  Her device reveals that since August, she has had increased afib. chads2vasc score is 4.  She is on eliquis She does not wish to make changes at this time. Stop ASA  2. Obesity Body mass index is 40.42 kg/m. Lifestyle modification is advised  3. HTN Stable No change required today  4. OSA Not compliant with CPAP I will refer to Dr Ron Parker for consider of orthodontic therapy for OSA   Risks, benefits and potential toxicities for medications prescribed and/or refilled reviewed with patient today.    Follow-up:  4 months with AF clinic and [name] I will see as needed  Current medicines are reviewed at length with the patient today.   The patient does not have concerns regarding her medicines.  The following changes were made today:  none  Labs/ tests ordered today include:  No orders of the defined types were placed in this encounter.    Army Fossa, MD  02/25/2020 10:43 AM     [facility] [address] [place] [address] [phone] (office) [phone]  (fax)

## 2020-02-25 NOTE — Patient Instructions (Addendum)
Medication Instructions:  Stop aspirin EC 81 mg  Your physician recommends that you continue on your current medications as directed. Please refer to the Current Medication list given to you today.  Labwork: None ordered.  Testing/Procedures: None ordered.  Follow-Up:  Your physician wants you to follow-up in: Afib Clinic in 4 months they will contact you to schedule.    You will receive a reminder letter in the mail two months in advance. If you don't receive a letter, please call our office to schedule the follow-up appointment.  Any Other Special Instructions Will Be Listed Below (If Applicable).  If you need a refill on your cardiac medications before your next appointment, please call your pharmacy.

## 2020-02-25 NOTE — Telephone Encounter (Signed)
SEE [place] PHONE NOTE

## 2020-02-26 ENCOUNTER — Telehealth: Payer: Self-pay | Admitting: Internal Medicine

## 2020-02-26 NOTE — Telephone Encounter (Signed)
Sandra Ruiz is calling stating the Docotor that performed her ablation several years ago was Dr. Donnie Mesa. She states their phone number is [phone]. Their medical record phone number is [phone]. She states it was about 5 years ago when it was performed. Please advise.

## 2020-02-26 NOTE — Telephone Encounter (Signed)
Associate from Dr. Priscille Kluver office called. She got a fax from our office for a records request but that office has not treated this patient. The associate from Dr. Kae Heller office would like to know what to do. Please advise

## 2020-02-26 NOTE — Telephone Encounter (Signed)
Butch Penny given printed copy to follow up on.

## 2020-02-26 NOTE — Telephone Encounter (Signed)
Printed for [facility] in check out to follow up on.

## 2020-02-26 NOTE — Addendum Note (Signed)
Addended by: Darrell Jewel on: 02/26/2020 04:42 PM   Modules accepted: Orders

## 2020-02-28 ENCOUNTER — Telehealth: Payer: Self-pay | Admitting: Emergency Medicine

## 2020-02-28 NOTE — Telephone Encounter (Signed)
error 

## 2020-03-01 ENCOUNTER — Other Ambulatory Visit: Payer: Self-pay | Admitting: Medical

## 2020-03-03 NOTE — Telephone Encounter (Signed)
Is this okay to refill? 

## 2020-03-05 ENCOUNTER — Telehealth: Payer: Self-pay

## 2020-03-05 DIAGNOSIS — M4802 Spinal stenosis, cervical region: Secondary | ICD-10-CM | POA: Diagnosis not present

## 2020-03-05 DIAGNOSIS — M5416 Radiculopathy, lumbar region: Secondary | ICD-10-CM | POA: Diagnosis not present

## 2020-03-05 DIAGNOSIS — G959 Disease of spinal cord, unspecified: Secondary | ICD-10-CM | POA: Diagnosis not present

## 2020-03-05 DIAGNOSIS — M48062 Spinal stenosis, lumbar region with neurogenic claudication: Secondary | ICD-10-CM | POA: Diagnosis not present

## 2020-03-05 NOTE — Telephone Encounter (Signed)
I let the patient know she is now in our [place] system. I asked her to go ahead and plug the monitor up so it can charge and get updates. I told her I will call her around 3 or 4 tomorrow to help her send a manual transmission. I told her if we can not get the monitor to work I will call tech support to get additional help for her. The pt verbalized understanding and thanked me for the call.

## 2020-03-07 NOTE — Telephone Encounter (Signed)
Returning patients phone call. Reports yesterday early afternoon she felt her heart "a little fluttering." Denies chest pain, shortness of breath or dizziness during episode. States she has been fine today. Manual transmission received and reviewed. No alerts noted during time of complaint. Patient did not press symtpom activator during event. Education provided on when to use symptoms activator. ED precautions given w/ verbal understanding. Advised patient to call back if symptoms persist or worsen.

## 2020-03-07 NOTE — Telephone Encounter (Signed)
I let the patient know the nurse will give her a call back as soon as she can.

## 2020-03-07 NOTE — Telephone Encounter (Signed)
The pt states she felt numb and felt some racing in her heart.  She used her symptom activator last night. I let her speak with Cindy, rn.

## 2020-03-12 ENCOUNTER — Ambulatory Visit (INDEPENDENT_AMBULATORY_CARE_PROVIDER_SITE_OTHER): Payer: Medicare HMO

## 2020-03-12 DIAGNOSIS — I4819 Other persistent atrial fibrillation: Secondary | ICD-10-CM | POA: Diagnosis not present

## 2020-03-12 LAB — CUP PACEART REMOTE DEVICE CHECK
Date Time Interrogation Session: 20220309031508
Implantable Pulse Generator Implant Date: 20191104

## 2020-03-15 DIAGNOSIS — G4730 Sleep apnea, unspecified: Secondary | ICD-10-CM | POA: Diagnosis not present

## 2020-03-18 DIAGNOSIS — M48061 Spinal stenosis, lumbar region without neurogenic claudication: Secondary | ICD-10-CM | POA: Diagnosis not present

## 2020-03-18 DIAGNOSIS — M50223 Other cervical disc displacement at C6-C7 level: Secondary | ICD-10-CM | POA: Diagnosis not present

## 2020-03-18 DIAGNOSIS — M4802 Spinal stenosis, cervical region: Secondary | ICD-10-CM | POA: Diagnosis not present

## 2020-03-18 DIAGNOSIS — M48062 Spinal stenosis, lumbar region with neurogenic claudication: Secondary | ICD-10-CM | POA: Diagnosis not present

## 2020-03-18 DIAGNOSIS — M5126 Other intervertebral disc displacement, lumbar region: Secondary | ICD-10-CM | POA: Diagnosis not present

## 2020-03-20 NOTE — Progress Notes (Signed)
[facility] Summary Report / Loop Recorder 

## 2020-03-26 DIAGNOSIS — Z9889 Other specified postprocedural states: Secondary | ICD-10-CM | POA: Diagnosis not present

## 2020-03-26 DIAGNOSIS — Z7901 Long term (current) use of anticoagulants: Secondary | ICD-10-CM | POA: Diagnosis not present

## 2020-03-26 DIAGNOSIS — I10 Essential (primary) hypertension: Secondary | ICD-10-CM | POA: Diagnosis not present

## 2020-03-26 DIAGNOSIS — M48062 Spinal stenosis, lumbar region with neurogenic claudication: Secondary | ICD-10-CM | POA: Diagnosis not present

## 2020-04-01 ENCOUNTER — Telehealth: Payer: Self-pay | Admitting: Medical

## 2020-04-01 NOTE — Telephone Encounter (Signed)
I received sleep study results.   We probably need visit to document, so schedule  I believe she has used CPAP before.  Is she using one currently?  If no, what was the reason  recent cardiology notes shows she was doing to see oral surgeon about dental appliance for sleep apnea  What was her interest in treatment options?

## 2020-04-04 ENCOUNTER — Telehealth: Payer: Self-pay | Admitting: Medical

## 2020-04-04 NOTE — Telephone Encounter (Signed)
Hello Dr. Leanor Rubenstein neurosurgery is planning lumbar spine epidural steroid injection.  They are wanting to stop her Eliquis 3 days prior to procedure.   They are requesting approval or recommendations on this?  I wanted to get your thoughts.  Thanks Dorothea Ogle, PA-C

## 2020-04-04 NOTE — Telephone Encounter (Signed)
FYI

## 2020-04-04 NOTE — Telephone Encounter (Signed)
Is this an FYI or am I suppose to respond?

## 2020-04-04 NOTE — Telephone Encounter (Signed)
error 

## 2020-04-04 NOTE — Telephone Encounter (Signed)
Patient stated she is suppose to see oral surgeon because she can't keep the cpap machine on at night. She takes it off. Pt has an appointment 05/01/20

## 2020-04-05 ENCOUNTER — Other Ambulatory Visit: Payer: Self-pay | Admitting: Medical

## 2020-04-07 NOTE — Telephone Encounter (Signed)
Is this ok to refill,reply to [facility]

## 2020-04-08 ENCOUNTER — Telehealth: Payer: Self-pay | Admitting: Medical

## 2020-04-08 NOTE — Telephone Encounter (Signed)
Please call nurse for Dr. Allred/cardiology.  I had sent Dr. Rayann Heman a message through My Chart, but haven't heard back.  [place] [facility] is planning lumbar spine epidural steroid injection.  They are wanting to stop her Eliquis 3 days prior to procedure.   They are requesting approval or recommendations on this?  I wanted to get Dr. Jackalyn Lombard feedback so I can inform Neurosurgery  Thanks Dorothea Ogle, PA-C

## 2020-04-10 NOTE — Telephone Encounter (Signed)
Patient with diagnosis of afib on Eliquis for anticoagulation.    Procedure: lumbar ESI Date of procedure: TBD  CHA2DS2-VASc Score = 7  This indicates a 11.2% annual risk of stroke. The patient's score is based upon: CHF History: No HTN History: Yes Diabetes History: Yes Stroke History: Yes Vascular Disease History: Yes (elevated calcium score) Age Score: 1 Gender Score: 1  CrCl 72mL/min using adjusted body weight Platelet count 236K  Typically hold Eliquis for 3 days prior to [facility]. Given pt's elevated CV risk including prior TIA history, pt does not clearly fall into PharmD protocol - will defer to MD for input (hold Eliquis for 2-3 days or bridge with Lovenox while off of Eliquis).

## 2020-04-10 NOTE — Telephone Encounter (Signed)
Will forward to our anticoagulation team for perioperative anticoagulation management per protocol.

## 2020-04-10 NOTE — Telephone Encounter (Signed)
This information was routed to Dr. Rayann Heman at cardiology. He was forwarding to pharmacist to make sure this is ok.

## 2020-04-13 NOTE — Telephone Encounter (Signed)
I agree that she is at increased risks off of anticoagulation.  Unfortunately, with spinal procedure, risks of  Bleeding is also high.   Recs: Avoid procedure if able. If unable to avoid procedure, hold eliquis 3 days prior to the procedure and resume as soon as able post procedure.  Thompson Grayer MD, [place] 04/13/2020 11:38 AM

## 2020-04-14 ENCOUNTER — Ambulatory Visit (INDEPENDENT_AMBULATORY_CARE_PROVIDER_SITE_OTHER): Payer: Medicare HMO

## 2020-04-14 ENCOUNTER — Telehealth: Payer: Self-pay

## 2020-04-14 DIAGNOSIS — I4819 Other persistent atrial fibrillation: Secondary | ICD-10-CM

## 2020-04-14 NOTE — Telephone Encounter (Signed)
[facility] and lmt[facility] to send over a formal surgical clearance over to our office.

## 2020-04-14 NOTE — Telephone Encounter (Signed)
   Patient Name: Sandra Ruiz  DOB: 01/26/1945  MRN: [id number]   Primary Cardiologist: No primary care provider on file.  Chart reviewed as part of pre-operative protocol coverage. Patient was contacted 04/14/2020 in reference to pre-operative risk assessment for pending surgery as outlined below.  KY MOSKOWITZ was last seen on 02/25/20  by Dr. Rayann Heman.  Since that day, MADASON RAULS has done well from a cardiac persepctive. Per Dr. Jackalyn Lombard recommendations okay to hold Eliquis 3 days prior to surgery and resume as soon as possible post procedure.   Therefore, based on ACC/AHA guidelines, the patient would be at acceptable risk for the planned procedure without further cardiovascular testing.   The patient was advised that if she develops new symptoms prior to surgery to contact our office to arrange for a follow-up visit, and she verbalized understanding.  I will route this recommendation to the requesting party via Epic fax function and remove from pre-op pool. Please call with questions.  Sahmya Arai Ninfa Meeker, PA-C 04/14/2020, 5:55 PM

## 2020-04-14 NOTE — Telephone Encounter (Signed)
   [facility] Pre-operative Risk Assessment    Patient Name: Sandra Ruiz  DOB: 1945/12/14  MRN: [id number]   HEARTCARE STAFF: - Please ensure there is not already an duplicate clearance open for this procedure. - Under Visit Info/Reason for Call, type in Other and utilize the format Clearance MM/DD/YY or Clearance TBD. Do not use dashes or single digits. - If request is for dental extraction, please clarify the # of teeth to be extracted.  Request for surgical clearance:  1. What type of surgery is being performed? LESI L3-L4   2. When is this surgery scheduled? 05/15/20   3. What type of clearance is required (medical clearance vs. Pharmacy clearance to hold med vs. Both)? PHARMACY  4. Are there any medications that need to be held prior to surgery and how long? ELIQUIS X 3 DAYS   5. Practice name and name of physician performing surgery? [facility], DR. DAVE EICHMAN   6. What is the office phone number? [phone]   7.   What is the office fax number? [phone]  8.   Anesthesia type (None, local, MAC, general) ? NONE LISTED   Jacinta Shoe 04/14/2020, 4:25 PM  _________________________________________________________________   (provider comments below)

## 2020-04-14 NOTE — Telephone Encounter (Signed)
Hello  We received an official request for clearance by the surgeon's office asking about the anticoagulant.    I have sent them back Dr. Jackalyn Lombard reply.     Dorothea Ogle, PA-C

## 2020-04-14 NOTE — Telephone Encounter (Signed)
   Patient Name: Sandra Ruiz  DOB: 1945-09-15  MRN: [id number]   Primary Cardiologist: No primary care provider on file.  Chart reviewed as part of pre-operative protocol coverage. Given past medical history and time since last visit, based on ACC/AHA guidelines, ROLINDA IMPSON would be at acceptable risk for the planned procedure without further cardiovascular testing.   The patient was advised that if she develops new symptoms prior to surgery to contact our office to arrange for a follow-up visit, and she verbalized understanding.  I will route this recommendation to the requesting party via Epic fax function and remove from pre-op pool.  Please call with questions.  Shyloh Krinke Ninfa Meeker, PA-C 04/14/2020, 5:55 PM

## 2020-04-14 NOTE — Telephone Encounter (Signed)
Called and LVM for patient to call back and ask for pre-op team

## 2020-04-14 NOTE — Telephone Encounter (Signed)
Will have our team call the neurosurgery office, since there was no "official request" sent, to see if this procedure is medically necessary. If yes, recommendations as below.   Spoke to the patient, and she is stable from a cardiac perspective.

## 2020-04-16 ENCOUNTER — Encounter: Payer: Self-pay | Admitting: Medical

## 2020-04-16 LAB — CUP PACEART REMOTE DEVICE CHECK
Date Time Interrogation Session: 20220411041736
Implantable Pulse Generator Implant Date: 20191104

## 2020-04-29 NOTE — Progress Notes (Signed)
[facility] Summary Report / Loop Recorder 

## 2020-05-01 ENCOUNTER — Other Ambulatory Visit: Payer: Self-pay

## 2020-05-01 ENCOUNTER — Ambulatory Visit (INDEPENDENT_AMBULATORY_CARE_PROVIDER_SITE_OTHER): Payer: Medicare HMO | Admitting: Medical

## 2020-05-01 ENCOUNTER — Encounter: Payer: Self-pay | Admitting: Medical

## 2020-05-01 VITALS — BP 120/60 | HR 78 | Ht 62.0 in | Wt 224.2 lb

## 2020-05-01 DIAGNOSIS — H538 Other visual disturbances: Secondary | ICD-10-CM | POA: Diagnosis not present

## 2020-05-01 DIAGNOSIS — Z8619 Personal history of other infectious and parasitic diseases: Secondary | ICD-10-CM

## 2020-05-01 DIAGNOSIS — G8929 Other chronic pain: Secondary | ICD-10-CM | POA: Diagnosis not present

## 2020-05-01 DIAGNOSIS — W19XXXA Unspecified fall, initial encounter: Secondary | ICD-10-CM | POA: Diagnosis not present

## 2020-05-01 DIAGNOSIS — Z1211 Encounter for screening for malignant neoplasm of colon: Secondary | ICD-10-CM | POA: Insufficient documentation

## 2020-05-01 DIAGNOSIS — R29898 Other symptoms and signs involving the musculoskeletal system: Secondary | ICD-10-CM | POA: Diagnosis not present

## 2020-05-01 DIAGNOSIS — M549 Dorsalgia, unspecified: Secondary | ICD-10-CM

## 2020-05-01 DIAGNOSIS — R519 Headache, unspecified: Secondary | ICD-10-CM | POA: Diagnosis not present

## 2020-05-01 DIAGNOSIS — R410 Disorientation, unspecified: Secondary | ICD-10-CM | POA: Diagnosis not present

## 2020-05-01 DIAGNOSIS — H532 Diplopia: Secondary | ICD-10-CM | POA: Diagnosis not present

## 2020-05-01 DIAGNOSIS — D649 Anemia, unspecified: Secondary | ICD-10-CM

## 2020-05-01 DIAGNOSIS — M25511 Pain in right shoulder: Secondary | ICD-10-CM | POA: Insufficient documentation

## 2020-05-01 DIAGNOSIS — E039 Hypothyroidism, unspecified: Secondary | ICD-10-CM | POA: Diagnosis not present

## 2020-05-01 DIAGNOSIS — F321 Major depressive disorder, single episode, moderate: Secondary | ICD-10-CM | POA: Diagnosis not present

## 2020-05-01 MED ORDER — DULOXETINE HCL 60 MG PO CPEP: 60.0000 mg | ORAL_CAPSULE | Freq: Two times a day (BID) | ORAL | 0 refills | Status: DC

## 2020-05-01 NOTE — Progress Notes (Signed)
Subjective:  Sandra Ruiz is a 75 y.o. female who presents for Chief Complaint  Patient presents with  . Follow-up    Pt present for follow up on Diabetes and sleep study.      Medical team: Dr. Thompson Grayer, Dr. Kirk Ruths, cardiology Dr. Renato Shin, endocrinology Dr. Devonne Doughty, ophthalmology Erskine Emery, PA, orthopedics [facility], Dr. Lenord Carbo  Here today for med check.  Her main concern today is pain.  She was tearful at times.  She notes that she is in pain all the time.  She says she hurts all over.  She says her pain limits her from being able to go out and do things she would like to do.  She has chronic back pain but also notes that she got hurt in her arms and legs and just all over.  No specific joint swelling.  She has seen [place] [facility] twice and has follow-up on May 12 but feels frustrated with their office.  They were awaiting records from [place] to do other recommendations.  She is scheduled to have epidural spinal injection.  She had to get clearance from cardiology recently to stop Eliquis short-term for procedure  She notes frequent headaches of late, double vision and blurred vision.  She feels confused at times.  She feels like she has trouble even putting words together on the page at times.  She notes in the past she has always been mentally sharp.  Lately she feels offkilter in addition to chronic pain  She knows she is due for colonoscopy but has not got around to doing this due to these other issues above that are taking priority  She does report compliance with all medications in general.  She has not been checking her sugars regularly mainly due to frustration with pain  She cannot tolerate CPAP since she tosses and turns in does all of her CPAP at night.  She is seeing Dr. Ron Parker soon for discussion of oral mouthpiece for sleep apnea  Diabetes-she is compliant with her medication, no recent  hypoglycemia  Hyperlipidemia-she is compliant her cholesterol medications  She notes history of Lyme disease in recent years and never fully got over this despite antibiotic.  She wants to recheck on Lyme disease labs  Right shoulder pain-she fell out of her bed about a month ago, roughly March 28.  She notes that her mattress will folded over due to the way her husband moves to bed when he gets out of the bed.  She knows it needs to be secured.  It is also an adjustable electric bed.  No other c/o.  Past Medical History:  Diagnosis Date  . Chronic pain   . Depression   . Diabetes mellitus without complication ([place])   . GERD (gastroesophageal reflux disease)   . History of TIA (transient ischemic attack)   . Hyperlipidemia   . Hyperopia of both eyes with astigmatism and presbyopia 12/21/2019  . Hypertension   . Lyme disease   . Paroxysmal atrial fibrillation (HCC)   . Thyroid disease    Current Outpatient Medications on File Prior to Visit  Medication Sig Dispense Refill  . ACCU-CHEK GUIDE test strip TEST 1 - 2 TIMES DAILY 200 strip 0  . Accu-Chek Softclix Lancets lancets CHECK BLOOD SUGAR ONE TO TWO TIMES DAILY. 200 each 2  . ALPRAZolam (XANAX) 0.25 MG tablet Take 1 tablet (0.25 mg total) by mouth 2 (two) times daily as needed for anxiety. 20 tablet  0  . Blood Glucose Monitoring Suppl (ACCU-CHEK GUIDE ME) w/Device KIT Test 1-2 times daily 1 kit 0  . carvedilol (COREG) 25 MG tablet TAKE 1 TABLET TWICE DAILY WITH MEALS 180 tablet 0  . diltiazem (DILACOR XR) 180 MG 24 hr capsule Take 1 capsule (180 mg total) by mouth daily. 90 capsule 3  . ELIQUIS 5 MG TABS tablet TAKE 1 TABLET TWICE DAILY 180 tablet 1  . ezetimibe (ZETIA) 10 MG tablet Take 1 tablet (10 mg total) by mouth daily. 90 tablet 3  . gabapentin (NEURONTIN) 800 MG tablet Take 1 tablet (800 mg total) by mouth 3 (three) times daily. 270 tablet 1  . glyBURIDE (DIABETA) 1.25 MG tablet Take 1 tablet (1.25 mg total) by mouth every  morning. 90 tablet 3  . JANUMET 50-1000 MG tablet TAKE 1 TABLET TWICE DAILY WITH MEALS 180 tablet 0  . levothyroxine (SYNTHROID) 75 MCG tablet TAKE 1 TABLET EVERY DAY BEFORE BREAKFAST 90 tablet 0  . lisinopril (ZESTRIL) 30 MG tablet Take 1 tablet (30 mg total) by mouth daily. (Patient taking differently: Take 30 mg by mouth daily in the afternoon.) 90 tablet 3  . methocarbamol (ROBAXIN) 500 MG tablet TAKE 1 TABLET TWICE DAILY 180 tablet 0  . omeprazole (PRILOSEC) 20 MG capsule TAKE 1 CAPSULE BY MOUTH EVERY DAY (Patient taking differently: Take 20 mg by mouth daily.) 90 capsule 2  . oxybutynin (DITROPAN-XL) 10 MG 24 hr tablet Take 1 tablet (10 mg total) by mouth 2 (two) times daily. 180 tablet 3  . pravastatin (PRAVACHOL) 40 MG tablet Take 1 tablet (40 mg total) by mouth every evening. 90 tablet 3  . ferrous gluconate (FERGON) 324 MG tablet Take 1 tablet (324 mg total) by mouth daily with breakfast. (Patient not taking: Reported on 05/01/2020) 30 tablet 2  . folic acid (FOLVITE) 1 MG tablet Take 1 tablet (1 mg total) by mouth daily. (Patient not taking: Reported on 05/01/2020) 90 tablet 1   No current facility-administered medications on file prior to visit.     The following portions of the patient's history were reviewed and updated as appropriate: allergies, current medications, past family history, past medical history, past social history, past surgical history and problem list.  ROS Otherwise as in subjective above   Objective: BP 120/60   Pulse 78   Ht _0  (1.575 m)   Wt 224 lb 3.2 oz (101.7 kg)   SpO2 94%   BMI 41.01 kg/m   Wt Readings from Last 3 Encounters:  05/01/20 224 lb 3.2 oz (101.7 kg)  02/25/20 221 lb (100.2 kg)  02/07/20 222 lb 6.4 oz (100.9 kg)    General appearance: alert, no distress, well developed, well nourished, tearful at times  Psych: Pleasant but tearful at times, seems frustrated neck: supple, no lymphadenopathy, no thyromegaly, no masses, anterior  cervical surgical scar  Heart: 2/6 brief holosystolic murmur heard best in upper sternal borders, rate is regular today, normal S1-S2 lungs: CTA bilaterally, no wheezes, rhonchi, or rales Back: Lumbar spine tenderness, seems to be a little weak getting in and out of chairs in terms of her leg strength, decreased back range of motion in general Abdomen: +bs, soft, non tender, non distended, no masses, no hepatomegaly, no splenomegaly Musculoskeletal: She was not particularly tender throughout the arms and legs except for the lateral thighs bilaterally, no obvious swelling or deformity Pulses: 2+ radial pulses, 2+ pedal pulses, normal cap refill Ext: no edema Neuro: CN II through XII  intact, upper and lower extremity strength seems to be 4-5 out of 5 throughout, DTRs somewhat dull throughout, unable to do heel-to-toe, otherwise nonfocal exam    Assessment: Encounter Diagnoses  Name Primary?  . Acute nonintractable headache, unspecified headache type Yes  . Other chronic pain   . Blurred vision   . Diplopia   . Chronic anemia   . Screen for colon cancer   . Confusion   . History of Lyme disease   . Fall, initial encounter   . Chronic bilateral back pain, unspecified back location   . Right shoulder pain, unspecified chronicity      Plan: Recent frequent headaches, blurred vision, diplopia-referral for MRI brain  Chronic pain-follow-up with neurosurgery soon as planned.  In the meantime she will increase her Cymbalta to twice daily 60 mg twice daily.  We discussed possibly referring to pain clinic or integrative therapies for other alternative ways to deal with pain including biofeedback.  She will consider other options.  She is already on twice daily methocarbamol, higher dose of gabapentin.  I advised her to consider water aerobics or pool therapy, or possibly referral to physical therapy  Chronic anemia-additional labs today.  She is due for colonoscopy at this time as  well.  Screen for colon cancer-I encouraged her to go ahead and get back in touch with gastroenterology to schedule a colonoscopy  Sleep apnea-unable to tolerate CPAP.  She has follow-up pending with oral surgery to look at oral airway device for sleep apnea  Fall- advise she and husband work to get something to secure the mattress so the mattress does not slip or slide off the box springs which seems to be one of the causes of her recent fall  Diabetes- continue current regimen  Hypertension-continue current regimen  Dyslipidemia-continue current regimen  Hypothyroidism-continue current medications  Paroxysmal atrial fibrillation- I reviewed her recent cardiology notes.  Currently she seems to be in sinus rhythm today.  She will continue routine follow-up with cardiology   Denni was seen today for follow-up.  Diagnoses and all orders for this visit:  Acute nonintractable headache, unspecified headache type -     Prolactin -     MR Brain Wo Contrast; Future  Other chronic pain -     B. Burgdorfi Antibodies -     Sedimentation rate  Blurred vision -     Prolactin -     MR Brain Wo Contrast; Future  Diplopia -     Prolactin -     MR Brain Wo Contrast; Future  Chronic anemia -     Iron and TIBC -     Ferritin -     Folate -     Vitamin B12  Screen for colon cancer  Confusion -     Prolactin -     MR Brain Wo Contrast; Future -     B. Burgdorfi Antibodies -     Sedimentation rate  History of Lyme disease -     B. Burgdorfi Antibodies -     Sedimentation rate  Fall, initial encounter  Chronic bilateral back pain, unspecified back location  Right shoulder pain, unspecified chronicity  Other orders -     DULoxetine (CYMBALTA) 60 MG capsule; Take 1 capsule (60 mg total) by mouth 2 (two) times daily.   Spent 50 minutes face to face with patient in discussion of symptoms, evaluation, plan and recommendations.    Follow up: next visit medicare wellness in  3 months  for general f/u, f/u pending MRI and labs

## 2020-05-02 LAB — VITAMIN B12: Vitamin B-12: 605 pg/mL (ref 232–1245)

## 2020-05-02 LAB — IRON AND TIBC
Iron Saturation: 20 % (ref 15–55)
Iron: 78 ug/dL (ref 27–139)
Total Iron Binding Capacity: 393 ug/dL (ref 250–450)
UIBC: 315 ug/dL (ref 118–369)

## 2020-05-02 LAB — FOLATE: Folate: 17.5 ng/mL (ref >3.0)

## 2020-05-02 LAB — FERRITIN: Ferritin: 15 ng/mL (ref 15–150)

## 2020-05-02 LAB — PROLACTIN: Prolactin: 10 ng/mL (ref 4.8–23.3)

## 2020-05-02 LAB — SEDIMENTATION RATE: Sed Rate: 72 mm/hr — ABNORMAL HIGH (ref 0–40)

## 2020-05-02 LAB — LYME DISEASE SEROLOGY W/REFLEX: Lyme Total Antibody EIA: NEGATIVE

## 2020-05-08 ENCOUNTER — Other Ambulatory Visit: Payer: Self-pay | Admitting: Medical

## 2020-05-08 NOTE — Telephone Encounter (Signed)
Is this okay to refill? 

## 2020-05-12 ENCOUNTER — Other Ambulatory Visit: Payer: Self-pay | Admitting: Medical

## 2020-05-14 ENCOUNTER — Other Ambulatory Visit: Payer: Self-pay | Admitting: Medical

## 2020-05-14 NOTE — Telephone Encounter (Signed)
Sandra Ruiz is requesting to fill pt cymbalta. Last ov was 05/01/20 and cpe is 8/22. Please advise [facility]

## 2020-05-15 DIAGNOSIS — M48062 Spinal stenosis, lumbar region with neurogenic claudication: Secondary | ICD-10-CM | POA: Diagnosis not present

## 2020-05-16 ENCOUNTER — Other Ambulatory Visit: Payer: Self-pay | Admitting: Medical

## 2020-05-17 NOTE — Progress Notes (Signed)
HPI: FU atrial fibrillation. She has a history of paroxysmal atrial fibrillation and has had previous ablation in [place]. Carotid Dopplers February 2018 showed no significant stenosis. Transesophageal echocardiogram January 2020 in [place] showed normal LV function, mild mitral regurgitation and small secundum ASD.  Echocardiogram June 2021 showed normal LV function, mild left ventricular hypertrophy, grade 1 diastolic dysfunction.  Nuclear study June 2021 showed ejection fraction 72% with normal perfusion.  Cardiac CTA December 2021 showed calcium score 112 and minimal nonobstructive coronary artery disease.  There was note of PFO versus small secundum ASD. Since last seen she does note dyspnea on exertion.  She has occasional chest tightness that is substernal and can last an hour at a time.  Not related to activities.  She has spells of atrial fibrillation by her report.  She also notes dizziness with standing but no syncope.  Current Outpatient Medications  Medication Sig Dispense Refill  . ACCU-CHEK GUIDE test strip TEST 1 - 2 TIMES DAILY 200 strip 0  . Accu-Chek Softclix Lancets lancets CHECK BLOOD SUGAR ONE TO TWO TIMES DAILY. 200 each 2  . ALPRAZolam (XANAX) 0.25 MG tablet Take 1 tablet (0.25 mg total) by mouth 2 (two) times daily as needed for anxiety. 20 tablet 0  . Blood Glucose Monitoring Suppl (ACCU-CHEK GUIDE ME) w/Device KIT Test 1-2 times daily 1 kit 0  . carvedilol (COREG) 25 MG tablet TAKE 1 TABLET TWICE DAILY WITH MEALS 180 tablet 0  . DILT-XR 180 MG 24 hr capsule TAKE 1 CAPSULE (180 MG TOTAL) BY MOUTH DAILY. 90 capsule 0  . DULoxetine (CYMBALTA) 60 MG capsule Take 1 capsule (60 mg total) by mouth 2 (two) times daily. 180 capsule 0  . ELIQUIS 5 MG TABS tablet TAKE 1 TABLET TWICE DAILY 180 tablet 1  . ezetimibe (ZETIA) 10 MG tablet Take 1 tablet (10 mg total) by mouth daily. 90 tablet 3  . ferrous gluconate (FERGON) 324 MG tablet Take 1 tablet (324 mg total) by mouth  daily with breakfast. 30 tablet 2  . gabapentin (NEURONTIN) 800 MG tablet Take 1 tablet (800 mg total) by mouth 3 (three) times daily. 270 tablet 1  . glyBURIDE (DIABETA) 1.25 MG tablet TAKE 1 TABLET (1.25 MG TOTAL) BY MOUTH EVERY MORNING. 90 tablet 0  . JANUMET 50-1000 MG tablet TAKE 1 TABLET TWICE DAILY WITH MEALS 180 tablet 0  . levothyroxine (SYNTHROID) 75 MCG tablet TAKE 1 TABLET EVERY DAY BEFORE BREAKFAST 90 tablet 0  . lisinopril (ZESTRIL) 30 MG tablet Take 1 tablet (30 mg total) by mouth daily. (Patient taking differently: Take 30 mg by mouth daily in the afternoon.) 90 tablet 3  . methocarbamol (ROBAXIN) 500 MG tablet TAKE 1 TABLET TWICE DAILY 180 tablet 0  . omeprazole (PRILOSEC) 20 MG capsule TAKE 1 CAPSULE BY MOUTH EVERY DAY (Patient taking differently: Take 20 mg by mouth daily.) 90 capsule 2  . oxybutynin (DITROPAN-XL) 10 MG 24 hr tablet TAKE 1 TABLET (10 MG TOTAL) BY MOUTH 2 (TWO) TIMES DAILY. 180 tablet 0  . pravastatin (PRAVACHOL) 40 MG tablet Take 1 tablet (40 mg total) by mouth every evening. 90 tablet 3   No current facility-administered medications for this visit.     Past Medical History:  Diagnosis Date  . Chronic pain   . Depression   . Diabetes mellitus without complication ([place])   . GERD (gastroesophageal reflux disease)   . History of TIA (transient ischemic attack)   .  Hyperlipidemia   . Hyperopia of both eyes with astigmatism and presbyopia 12/21/2019  . Hypertension   . Lyme disease   . Paroxysmal atrial fibrillation (HCC)   . Thyroid disease     Past Surgical History:  Procedure Laterality Date  . ATRIAL FIBRILLATION ABLATION     x2 in [place]  . CARDIAC CATHETERIZATION    . CERVICAL SPINE SURGERY    . implantable loop recorder placement  11/07/2017   MDT LINQ1 implanted in [place] for afib management by Dr Candiss Norse  . LUMBAR SPINE SURGERY    . TONSILLECTOMY AND ADENOIDECTOMY      Social History   Socioeconomic History  . Marital status: Married     Spouse name: Not on file  . Number of children: Not on file  . Years of education: Not on file  . Highest education level: Not on file  Occupational History  . Not on file  Tobacco Use  . Smoking status: Former Research scientist (life sciences)  . Smokeless tobacco: Never Used  Substance and Sexual Activity  . Alcohol use: Yes    Alcohol/week: 2.0 standard drinks    Types: 2 Standard drinks or equivalent per week    Comment: mix drinks occ.  . Drug use: Never  . Sexual activity: Not on file  Other Topics Concern  . Not on file  Social History Narrative   From [place], married, [place], new from [place] to [place]  07/2019   Social Determinants of Health   Financial Resource Strain: Not on file  Food Insecurity: Not on file  Transportation Needs: Not on file  Physical Activity: Not on file  Stress: Not on file  Social Connections: Not on file  Intimate Partner Violence: Not on file    Family History  Problem Relation Age of Onset  . CAD Mother   . Thyroid disease Mother   . CAD Father   . Hypothyroidism Daughter     ROS: no fevers or chills, productive cough, hemoptysis, dysphasia, odynophagia, melena, hematochezia, dysuria, hematuria, rash, seizure activity, orthopnea, PND, pedal edema, claudication. Remaining systems are negative.  Physical Exam: Well-developed obese in no acute distress.  Skin is warm and dry.  HEENT is normal.  Neck is supple.  Chest is clear to auscultation with normal expansion.  Cardiovascular exam is regular rate and rhythm.  Abdominal exam nontender or distended. No masses palpated. Extremities show no edema. neuro grossly intact  ECG-normal sinus rhythm at a rate of 80, no ST changes.  Personally reviewed  A/P  1 paroxysmal atrial fibrillation-patient remains in sinus rhythm status post ablation. She was noted to have increased burden of atrial fibrillation on previous interrogation of implantable loop and also notes increased  frequency of episodes at home.  She has symptoms with this.  We discussed antiarrhythmic therapy today but she would like to continue with present management.  We will consider Tikosyn in the future if needed.  Continue apixaban.  Also note she is being fitted for obstructive sleep apnea device.  2 history of small secundum ASD-previous echocardiogram did not show RV dilatation and she is not having symptoms.  Plan repeat echocardiogram June 2022.  3 coronary artery disease-minimal nonobstructive coronary disease noted on previous CTA.  We will continue Zetia and pravastatin.  4 hypertension-blood pressure controlled.  However she is having orthostatic symptoms.  Decrease lisinopril to 10 mg daily to see if this improves her symptoms.  5 hyperlipidemia-patient previously did not tolerate Crestor or Lipitor.  We will continue pravastatin and Zetia. Check lipids and liver.  6 diabetes mellitus-Per primary care.  7 chest pain-she has had intermittent chest pain for quite some time.  She did have a cardiac CTA December 2021 with minimal nonobstructive coronary disease.  Electrocardiogram shows no new ST changes.  We will follow for now.  Kirk Ruths, MD

## 2020-05-19 ENCOUNTER — Ambulatory Visit (INDEPENDENT_AMBULATORY_CARE_PROVIDER_SITE_OTHER): Payer: Medicare HMO

## 2020-05-19 DIAGNOSIS — I4819 Other persistent atrial fibrillation: Secondary | ICD-10-CM | POA: Diagnosis not present

## 2020-05-21 LAB — CUP PACEART REMOTE DEVICE CHECK
Date Time Interrogation Session: 20220514042058
Implantable Pulse Generator Implant Date: 20191104

## 2020-05-27 ENCOUNTER — Other Ambulatory Visit: Payer: Self-pay

## 2020-05-27 ENCOUNTER — Encounter: Payer: Self-pay | Admitting: Cardiology

## 2020-05-27 ENCOUNTER — Ambulatory Visit: Payer: Medicare HMO | Admitting: Cardiology

## 2020-05-27 VITALS — BP 110/72 | HR 80 | Ht 61.0 in | Wt 221.6 lb

## 2020-05-27 DIAGNOSIS — E7800 Pure hypercholesterolemia, unspecified: Secondary | ICD-10-CM | POA: Diagnosis not present

## 2020-05-27 DIAGNOSIS — Q211 Atrial septal defect, unspecified: Secondary | ICD-10-CM

## 2020-05-27 DIAGNOSIS — I480 Paroxysmal atrial fibrillation: Secondary | ICD-10-CM | POA: Diagnosis not present

## 2020-05-27 LAB — LIPID PANEL
Chol/HDL Ratio: 3 ratio (ref 0.0–4.4)
Cholesterol, Total: 151 mg/dL (ref 100–199)
HDL: 50 mg/dL (ref >39)
LDL Chol Calc (NIH): 87 mg/dL (ref 0–99)
Triglycerides: 72 mg/dL (ref 0–149)
VLDL Cholesterol Cal: 14 mg/dL (ref 5–40)

## 2020-05-27 LAB — HEPATIC FUNCTION PANEL
ALT: 33 IU/L — ABNORMAL HIGH (ref 0–32)
AST: 40 IU/L (ref 0–40)
Albumin: 4.3 g/dL (ref 3.7–4.7)
Alkaline Phosphatase: 101 IU/L (ref 44–121)
Bilirubin Total: 0.3 mg/dL (ref 0.0–1.2)
Bilirubin, Direct: 0.11 mg/dL (ref 0.00–0.40)
Total Protein: 6 g/dL (ref 6.0–8.5)

## 2020-05-27 MED ORDER — LISINOPRIL 10 MG PO TABS: 10.0000 mg | ORAL_TABLET | Freq: Every day | ORAL | 3 refills | Status: DC

## 2020-05-27 NOTE — Patient Instructions (Signed)
Medication Instructions:   DECREASE LISINOPRIL TO 10 MG ONCE DAILY  *If you need a refill on your cardiac medications before your next appointment, please call your pharmacy   Testing/Procedures:  Your physician has requested that you have an echocardiogram. Echocardiography is a painless test that uses sound waves to create images of your heart. It provides your doctor with information about the size and shape of your heart and how well your heart's chambers and valves are working. This procedure takes approximately one hour. There are no restrictions for this procedure.[place]     Follow-Up: At [facility], you and your health needs are our priority.  As part of our continuing mission to provide you with exceptional heart care, we have created designated Provider Care Teams.  These Care Teams include your primary Cardiologist (physician) and Advanced Practice Providers (APPs -  Physician Assistants and Nurse Practitioners) who all work together to provide you with the care you need, when you need it.  We recommend signing up for the patient portal called "MyChart".  Sign up information is provided on this After Visit Summary.  MyChart is used to connect with patients for Virtual Visits (Telemedicine).  Patients are able to view lab/test results, encounter notes, upcoming appointments, etc.  Non-urgent messages can be sent to your provider as well.   To learn more about what you can do with MyChart, go to [email or web address].    Your next appointment:   6 month(s)  The format for your next appointment:   In Person  Provider:   Kirk Ruths, MD

## 2020-05-28 ENCOUNTER — Encounter: Payer: Self-pay | Admitting: *Deleted

## 2020-06-01 ENCOUNTER — Other Ambulatory Visit: Payer: Self-pay | Admitting: Medical

## 2020-06-05 DIAGNOSIS — Z6841 Body Mass Index (BMI) 40.0 and over, adult: Secondary | ICD-10-CM | POA: Diagnosis not present

## 2020-06-05 DIAGNOSIS — M961 Postlaminectomy syndrome, not elsewhere classified: Secondary | ICD-10-CM | POA: Diagnosis not present

## 2020-06-05 DIAGNOSIS — I10 Essential (primary) hypertension: Secondary | ICD-10-CM | POA: Diagnosis not present

## 2020-06-05 DIAGNOSIS — M48062 Spinal stenosis, lumbar region with neurogenic claudication: Secondary | ICD-10-CM | POA: Diagnosis not present

## 2020-06-11 NOTE — Progress Notes (Signed)
[facility] Summary Report / Loop Recorder 

## 2020-06-18 ENCOUNTER — Other Ambulatory Visit: Payer: Self-pay | Admitting: Medical

## 2020-06-18 ENCOUNTER — Telehealth: Payer: Self-pay

## 2020-06-18 NOTE — Telephone Encounter (Signed)
Called [employer] & cost for Eliquis is $254 & also $251 for Janumet.  Those are her most expensive meds.  Called patient and advised she was denied for cost lowering for Eliquis and she states she is at her wits end because she can't afford her meds and gave info for Medicare low income Subsidy and also offered Patient assistance programs and she wants to try these.  Printed applications and she will pick up tomorrow at her appointment.   Also will see if we have samples.

## 2020-06-18 NOTE — Telephone Encounter (Signed)
Recv'd notice from [facility] that [place] exception denied for Eliquis

## 2020-06-18 NOTE — Telephone Encounter (Signed)
Sandra Ruiz, see below we have samples of Janumet, is it ok to give samples ?

## 2020-06-19 ENCOUNTER — Ambulatory Visit (INDEPENDENT_AMBULATORY_CARE_PROVIDER_SITE_OTHER): Payer: Medicare HMO | Admitting: Medical

## 2020-06-19 ENCOUNTER — Other Ambulatory Visit: Payer: Self-pay

## 2020-06-19 ENCOUNTER — Ambulatory Visit: Payer: Medicare HMO | Admitting: Medical

## 2020-06-19 ENCOUNTER — Encounter: Payer: Self-pay | Admitting: Medical

## 2020-06-19 VITALS — BP 112/68 | HR 74 | Ht 61.0 in | Wt 221.6 lb

## 2020-06-19 DIAGNOSIS — G8929 Other chronic pain: Secondary | ICD-10-CM

## 2020-06-19 DIAGNOSIS — Z8673 Personal history of transient ischemic attack (TIA), and cerebral infarction without residual deficits: Secondary | ICD-10-CM

## 2020-06-19 DIAGNOSIS — M5440 Lumbago with sciatica, unspecified side: Secondary | ICD-10-CM

## 2020-06-19 DIAGNOSIS — I10 Essential (primary) hypertension: Secondary | ICD-10-CM

## 2020-06-19 DIAGNOSIS — I4819 Other persistent atrial fibrillation: Secondary | ICD-10-CM

## 2020-06-19 DIAGNOSIS — M546 Pain in thoracic spine: Secondary | ICD-10-CM | POA: Diagnosis not present

## 2020-06-19 DIAGNOSIS — H532 Diplopia: Secondary | ICD-10-CM

## 2020-06-19 DIAGNOSIS — W19XXXA Unspecified fall, initial encounter: Secondary | ICD-10-CM

## 2020-06-19 DIAGNOSIS — D6869 Other thrombophilia: Secondary | ICD-10-CM

## 2020-06-19 DIAGNOSIS — S20219A Contusion of unspecified front wall of thorax, initial encounter: Secondary | ICD-10-CM | POA: Diagnosis not present

## 2020-06-19 DIAGNOSIS — G4733 Obstructive sleep apnea (adult) (pediatric): Secondary | ICD-10-CM | POA: Diagnosis not present

## 2020-06-19 DIAGNOSIS — M62838 Other muscle spasm: Secondary | ICD-10-CM | POA: Diagnosis not present

## 2020-06-19 DIAGNOSIS — Z79899 Other long term (current) drug therapy: Secondary | ICD-10-CM

## 2020-06-19 DIAGNOSIS — M549 Dorsalgia, unspecified: Secondary | ICD-10-CM

## 2020-06-19 DIAGNOSIS — E1159 Type 2 diabetes mellitus with other circulatory complications: Secondary | ICD-10-CM | POA: Diagnosis not present

## 2020-06-19 DIAGNOSIS — I152 Hypertension secondary to endocrine disorders: Secondary | ICD-10-CM

## 2020-06-19 DIAGNOSIS — M542 Cervicalgia: Secondary | ICD-10-CM

## 2020-06-19 NOTE — Progress Notes (Signed)
Working on getting this scheduled asap.

## 2020-06-19 NOTE — Progress Notes (Signed)
Subjective:  Sandra Ruiz is a 75 y.o. female who presents for Chief Complaint  Patient presents with   Back Pain    [place] out of bed about 3 weeks ago. But back hurts when breathing in. Had bruising on left side      Here for back pain.  [place] out of bed 3 weeks ago.   Has had some pains in chest wall and back,.  Had bruising of left chest wall laterally intiall few days after fall.  Having some back spasm.  Hurts taking deep breaths.   Has had some headaches since the fall.  Given the persistent headaches he was worried about doing a head scan given that she is on Eliquis blood thinner.  She denies new numbness, tingling, weakness, no vision change no facial changes no slurred speech.  She has a loop recorder in place and has had MRIs with this before.  She was on the schedule to have an MRI back in April but something happened and she never got a call  No other new complaint  No other aggravating or relieving factors.    No other c/o.  The following portions of the patient's history were reviewed and updated as appropriate: allergies, current medications, past family history, past medical history, past social history, past surgical history and problem list.  ROS Otherwise as in subjective above    Objective: BP 112/68   Pulse 74   Ht 5\' 1"  (1.549 m)   Wt 221 lb 9.6 oz (100.5 kg)   SpO2 96%   BMI 41.87 kg/m   Wt Readings from Last 3 Encounters:  06/19/20 221 lb 9.6 oz (100.5 kg)  05/27/20 221 lb 9.6 oz (100.5 kg)  05/01/20 224 lb 3.2 oz (101.7 kg)    General appearance: alert, no distress, well developed, well nourished HEENT: normocephalic, sclerae anicteric, conjunctiva pink and moist, TMs pearly, nares patent Neck: supple, no lymphadenopathy, no thyromegaly, no masses, normal range of motion, mild lateral tenderness left and right Heart: RRR, normal S1, S2, no murmurs Lungs: CTA bilaterally, no wheezes, rhonchi, or rales Back tender in the thoracic region and the  lateral left chest wall but no obvious bruising, no obvious flail chest, no swelling, range of motion relatively full Abdomen: +bs, soft, non tender, non distended, no masses, no hepatomegaly, no splenomegaly Pulses: 2+ radial pulses, 2+ pedal pulses, normal cap refill Ext: no edema Neuro: CN II through XII intact, Romberg a little off balance but otherwise nonfocal exam    Assessment: Encounter Diagnoses  Name Primary?   Fall, initial encounter Yes   Bilateral thoracic back pain, unspecified chronicity    Muscle spasm    Contusion of rib, unspecified laterality, initial encounter    High risk medication use    Secondary hypercoagulable state ([place])    Persistent atrial fibrillation (HCC)    OSA (obstructive sleep apnea)    Hypertension associated with diabetes ([place])    History of TIA (transient ischemic attack)    Essential hypertension, benign    Diplopia    Chronic neck pain    Chronic bilateral low back pain with sciatica, sciatica laterality unspecified    Chronic bilateral back pain, unspecified back location      Plan: We discussed that her exam suggest some soreness and spasm of muscles from the recent fall.  She can continue the medicine she already has for spasm.  Advise relative rest, take good deep breaths throughout the day every hour.  No obvious  indication for chest or back imaging today.  She seem to be moving pretty good without any major deformity or problems with range of motion of neck or back.  High risk medication use, recurrent headaches-we will work on scheduling MRI brain.  This was already scheduled back in April for other issues but she never got a call back from imaging  From her visit in April we were going to schedule MRI anyhow given history of TIA, double vision complaint, hypertension, risk for stroke advise if any worse or new symptoms in the short-term to call back or go to the emergency department  [name] was seen today for back  pain.  Diagnoses and all orders for this visit:  Fall, initial encounter  Bilateral thoracic back pain, unspecified chronicity  Muscle spasm  Contusion of rib, unspecified laterality, initial encounter  High risk medication use  Secondary hypercoagulable state ([place])  Persistent atrial fibrillation (HCC)  OSA (obstructive sleep apnea)  Hypertension associated with diabetes ([place])  History of TIA (transient ischemic attack)  Essential hypertension, benign  Diplopia  Chronic neck pain  Chronic bilateral low back pain with sciatica, sciatica laterality unspecified  Chronic bilateral back pain, unspecified back location   Follow up: pending MRI brain

## 2020-06-23 ENCOUNTER — Ambulatory Visit (INDEPENDENT_AMBULATORY_CARE_PROVIDER_SITE_OTHER): Payer: Medicare HMO

## 2020-06-23 DIAGNOSIS — I4819 Other persistent atrial fibrillation: Secondary | ICD-10-CM

## 2020-06-23 LAB — CUP PACEART REMOTE DEVICE CHECK
Date Time Interrogation Session: 20220616042249
Implantable Pulse Generator Implant Date: 20191104

## 2020-06-25 ENCOUNTER — Telehealth: Payer: Self-pay

## 2020-06-25 NOTE — Progress Notes (Addendum)
Primary Care Physician: [name], PA-C Primary Cardiologist: [name] Primary Electrophysiologist: [name] Referring Physician: [name] is a 75 y.o. female with a history of atrial fibrillation s/p ablation x 2 in [place], HLD, OSA, HTN, DM who presents for consultation in the [facility]. The patient was initially diagnosed with atrial fibrillation in 2019 and had two ablations that year before moving here from [place]. Patient is on Eliquis for a CHADS2VASC score of 4.   On follow up today, DCCV was arranged but she presented in [place]. She is enrolled in the device clinic for ILR remote monitoring. She describes palpitations at times but her ILR has not shown any interim afib. She admits she is stressed and overwhelmed with her medical issues. She also describes neck pain and right sided head ache. She has imaging pending per PCP. She denies any bleeding issues on anticoagulation. Patient does have an oral device for OSA and has been using with good result. She does feel more rested.   Today, she denies symptoms of chest pain, orthopnea, PND, lower extremity edema, dizziness, presyncope, syncope, bleeding, or neurologic sequela. The patient is tolerating medications without difficulties and is otherwise without complaint today.    Atrial Fibrillation Risk Factors:  she does have symptoms or diagnosis of sleep apnea. she is compliant with oral device. she does not have a history of rheumatic fever. she does not have a history of alcohol use. The patient does not have a history of early familial atrial fibrillation or other arrhythmias.  she has a BMI of Body mass index is 42.02 kg/m.Marland Kitchen Filed Weights   06/26/20 0947  Weight: 100.9 kg     Family History  Problem Relation Age of Onset   CAD Mother    Thyroid disease Mother    CAD Father    Hypothyroidism Daughter      Atrial Fibrillation Management  history:  Previous antiarrhythmic drugs: none Previous cardioversions: none Previous ablations: 2019 x 2 in [place] score: 4 Anticoagulation history: Eliquis   Past Medical History:  Diagnosis Date   Chronic pain    Depression    Diabetes mellitus without complication (HCC)    GERD (gastroesophageal reflux disease)    History of TIA (transient ischemic attack)    Hyperlipidemia    Hyperopia of both eyes with astigmatism and presbyopia 12/21/2019   Hypertension    Lyme disease    Paroxysmal atrial fibrillation (HCC)    Thyroid disease    Past Surgical History:  Procedure Laterality Date   ATRIAL FIBRILLATION ABLATION     x2 in [place]     implantable loop recorder placement  11/07/2017   MDT LINQ1 implanted in [place] for afib management by Dr Candiss Norse   LUMBAR SPINE SURGERY     TONSILLECTOMY AND ADENOIDECTOMY      Current Outpatient Medications  Medication Sig Dispense Refill   ACCU-CHEK GUIDE test strip TEST 1 - 2 TIMES DAILY 200 strip 0   Accu-Chek Softclix Lancets lancets CHECK BLOOD SUGAR ONE TO TWO TIMES DAILY. 200 each 2   ALPRAZolam (XANAX) 0.25 MG tablet Take 1 tablet (0.25 mg total) by mouth 2 (two) times daily as needed for anxiety. 20 tablet 0   Blood Glucose Monitoring Suppl (ACCU-CHEK GUIDE ME) w/Device KIT Test 1-2 times daily 1 kit 0   carvedilol (COREG) 25 MG tablet  TAKE 1 TABLET TWICE DAILY WITH MEALS 180 tablet 0   DILT-XR 180 MG 24 hr capsule TAKE 1 CAPSULE (180 MG TOTAL) BY MOUTH DAILY. 90 capsule 0   DULoxetine (CYMBALTA) 60 MG capsule Take 1 capsule (60 mg total) by mouth 2 (two) times daily. 180 capsule 0   ELIQUIS 5 MG TABS tablet TAKE 1 TABLET TWICE DAILY 180 tablet 1   ezetimibe (ZETIA) 10 MG tablet Take 1 tablet (10 mg total) by mouth daily. 90 tablet 3   ferrous gluconate (FERGON) 324 MG tablet Take 1 tablet (324 mg total) by mouth daily with breakfast. 30 tablet 2   gabapentin (NEURONTIN)  800 MG tablet Take 1 tablet (800 mg total) by mouth 3 (three) times daily. 270 tablet 1   glyBURIDE (DIABETA) 1.25 MG tablet TAKE 1 TABLET (1.25 MG TOTAL) BY MOUTH EVERY MORNING. 90 tablet 0   JANUMET 50-1000 MG tablet TAKE 1 TABLET TWICE DAILY WITH MEALS 180 tablet 0   levothyroxine (SYNTHROID) 75 MCG tablet TAKE 1 TABLET EVERY DAY BEFORE BREAKFAST 90 tablet 0   lisinopril (ZESTRIL) 10 MG tablet Take 1 tablet (10 mg total) by mouth daily. 90 tablet 3   methocarbamol (ROBAXIN) 500 MG tablet TAKE 1 TABLET TWICE DAILY 180 tablet 0   omeprazole (PRILOSEC) 20 MG capsule TAKE 1 CAPSULE EVERY DAY 90 capsule 0   oxybutynin (DITROPAN-XL) 10 MG 24 hr tablet TAKE 1 TABLET (10 MG TOTAL) BY MOUTH 2 (TWO) TIMES DAILY. 180 tablet 0   pravastatin (PRAVACHOL) 40 MG tablet Take 1 tablet (40 mg total) by mouth every evening. 90 tablet 3   No current facility-administered medications for this encounter.    Allergies  Allergen Reactions   Baclofen Swelling   Crestor [Rosuvastatin]     Extreme myalgias   Lipitor [Atorvastatin]     Extreme myalgias   Verapamil Swelling   Silicone Itching and Rash    Social History   Socioeconomic History   Marital status: Married    Spouse name: Not on file   Number of children: Not on file   Years of education: Not on file   Highest education level: Not on file  Occupational History   Not on file  Tobacco Use   Smoking status: Former    Pack years: 0.00   Smokeless tobacco: Never  Substance and Sexual Activity   Alcohol use: Yes    Alcohol/week: 2.0 standard drinks    Types: 2 Standard drinks or equivalent per week    Comment: mix drinks occ.   Drug use: Never   Sexual activity: Not on file  Other Topics Concern   Not on file  Social History Narrative   From [place], married, [place], new from [place] to [place]  07/2019   Social Determinants of Health   Financial Resource Strain: Not on file  Food Insecurity: Not on file   Transportation Needs: Not on file  Physical Activity: Not on file  Stress: Not on file  Social Connections: Not on file  Intimate Partner Violence: Not on file     ROS- All systems are reviewed and negative except as per the HPI above.  Physical Exam: Vitals:   06/26/20 0947  BP: 124/64  Pulse: 74  Weight: 100.9 kg  Height: _0  (1.549 m)    GEN- The patient is a well appearing obese female, alert and oriented x 3 today.   HEENT-head normocephalic, atraumatic, sclera clear, conjunctiva pink, hearing intact, trachea  midline. Lungs- Clear to ausculation bilaterally, normal work of breathing Heart- Regular rate and rhythm, no murmurs, rubs or gallops  GI- soft, NT, ND, + BS Extremities- no clubbing, cyanosis, or edema MS- no significant deformity or atrophy Skin- no rash or lesion Psych- euthymic mood, full affect Neuro- strength and sensation are intact   Wt Readings from Last 3 Encounters:  06/26/20 100.9 kg  06/19/20 100.5 kg  05/27/20 100.5 kg    EKG today demonstrates  SR Vent. rate 74 BPM PR interval 194 ms QRS duration 82 ms QT/QTcB 362/401 ms  Echo 06/21/19 demonstrated  1. Left ventricular ejection fraction, by estimation, is 65 to 70%. The  left ventricle has normal function. The left ventricle has no regional  wall motion abnormalities. There is mild concentric left ventricular  hypertrophy. Left ventricular diastolic  parameters are consistent with Grade I diastolic dysfunction (impaired  relaxation). The average left ventricular global longitudinal strain is  -20.9 %. The global longitudinal strain is normal.   2. Right ventricular systolic function is normal. The right ventricular  size is normal. Tricuspid regurgitation signal is inadequate for assessing  PA pressure.   3. The mitral valve is degenerative. No evidence of mitral valve  regurgitation. No evidence of mitral stenosis.   4. The aortic valve is tricuspid. Aortic valve regurgitation is  not  visualized. Mild aortic valve sclerosis is present, with no evidence of  aortic valve stenosis.   5. The inferior vena cava is normal in size with greater than 50%  respiratory variability, suggesting right atrial pressure of 3 mmHg.   Epic records are reviewed at length today  CHA2DS2-VASc Score = 7  The patient's score is based upon: CHF History: No HTN History: Yes Diabetes History: Yes Stroke History: Yes Vascular Disease History: Yes (elevated calcium score) Age Score: 1 Gender Score: 1      ASSESSMENT AND PLAN: 1. Persistent Atrial Fibrillation (ICD10:  I48.19) The patient's CHA2DS2-VASc score is 7, indicating a 11.2% annual risk of stroke.   S/p afib ablation in [place] x 2 2019. ILR shows 0% afib burden Could consider AAD if her afib burden increases.  Continue Coreg 25 mg BID Continue diltiazem 180 mg daily Continue Eliquis 5 mg BID Offered referral to behavioral medicine for stress/anxiety. Patient declines at this time but is thankful for the offer.   2. Secondary Hypercoagulable State (ICD10:  D68.69) The patient is at significant risk for stroke/thromboembolism based upon her CHA2DS2-VASc Score of 7.  Continue Apixaban (Eliquis).   3. Obesity Body mass index is 42.02 kg/m. Lifestyle modification was discussed and encouraged including regular physical activity and weight reduction.  4. OSA Patient using oral device. Followed by Dr Ron Parker.  5. HTN Stable, no changes today.   Follow up in the AF clinic in 6 months.    [facility] [address] [place], [place] [address] [phone] 06/26/2020 11:28 AM

## 2020-06-25 NOTE — Telephone Encounter (Signed)
[employer] called and would need you to Addend patient note on 02/04/20 to say that your ordered sleep study for patient as you did.

## 2020-06-26 ENCOUNTER — Ambulatory Visit (HOSPITAL_COMMUNITY)
Admission: RE | Admit: 2020-06-26 | Discharge: 2020-06-26 | Disposition: A | Discharge status: Home / Self Care | Payer: Medicare HMO | Source: Ambulatory Visit | Attending: Physician Assistant | Admitting: Physician Assistant

## 2020-06-26 ENCOUNTER — Other Ambulatory Visit: Payer: Self-pay

## 2020-06-26 ENCOUNTER — Telehealth: Payer: Self-pay

## 2020-06-26 ENCOUNTER — Encounter (HOSPITAL_COMMUNITY): Payer: Self-pay | Admitting: Physician Assistant

## 2020-06-26 VITALS — BP 124/64 | HR 74 | Ht 61.0 in | Wt 222.4 lb

## 2020-06-26 DIAGNOSIS — D6869 Other thrombophilia: Secondary | ICD-10-CM | POA: Diagnosis not present

## 2020-06-26 DIAGNOSIS — Z6841 Body Mass Index (BMI) 40.0 and over, adult: Secondary | ICD-10-CM | POA: Diagnosis not present

## 2020-06-26 DIAGNOSIS — I4819 Other persistent atrial fibrillation: Secondary | ICD-10-CM

## 2020-06-26 DIAGNOSIS — I119 Hypertensive heart disease without heart failure: Secondary | ICD-10-CM | POA: Insufficient documentation

## 2020-06-26 DIAGNOSIS — I358 Other nonrheumatic aortic valve disorders: Secondary | ICD-10-CM | POA: Insufficient documentation

## 2020-06-26 DIAGNOSIS — Z7984 Long term (current) use of oral hypoglycemic drugs: Secondary | ICD-10-CM | POA: Insufficient documentation

## 2020-06-26 DIAGNOSIS — E119 Type 2 diabetes mellitus without complications: Secondary | ICD-10-CM | POA: Diagnosis not present

## 2020-06-26 DIAGNOSIS — G4733 Obstructive sleep apnea (adult) (pediatric): Secondary | ICD-10-CM | POA: Diagnosis not present

## 2020-06-26 DIAGNOSIS — E669 Obesity, unspecified: Secondary | ICD-10-CM | POA: Insufficient documentation

## 2020-06-26 DIAGNOSIS — Z79899 Other long term (current) drug therapy: Secondary | ICD-10-CM | POA: Diagnosis not present

## 2020-06-26 DIAGNOSIS — E785 Hyperlipidemia, unspecified: Secondary | ICD-10-CM | POA: Diagnosis not present

## 2020-06-26 DIAGNOSIS — Z7901 Long term (current) use of anticoagulants: Secondary | ICD-10-CM | POA: Insufficient documentation

## 2020-06-26 DIAGNOSIS — Z87891 Personal history of nicotine dependence: Secondary | ICD-10-CM | POA: Diagnosis not present

## 2020-06-26 NOTE — Telephone Encounter (Signed)
Pt assistance application Janumet completed

## 2020-06-26 NOTE — Telephone Encounter (Signed)
Pt Assistance application for Eliquis completed

## 2020-07-02 ENCOUNTER — Ambulatory Visit (HOSPITAL_COMMUNITY): Payer: Medicare HMO | Attending: Cardiology

## 2020-07-02 ENCOUNTER — Other Ambulatory Visit: Payer: Self-pay

## 2020-07-02 DIAGNOSIS — Q211 Atrial septal defect, unspecified: Secondary | ICD-10-CM

## 2020-07-02 LAB — ECHOCARDIOGRAM COMPLETE
Area-P 1/2: 3.6 cm2
S' Lateral: 2.5 cm

## 2020-07-03 ENCOUNTER — Encounter: Payer: Self-pay | Admitting: *Deleted

## 2020-07-04 NOTE — Telephone Encounter (Signed)
Faxed, waiting response

## 2020-07-08 ENCOUNTER — Ambulatory Visit
Admission: RE | Admit: 2020-07-08 | Discharge: 2020-07-08 | Disposition: A | Discharge status: Home / Self Care | Payer: Medicare HMO | Source: Ambulatory Visit | Attending: Medical | Admitting: Medical

## 2020-07-08 ENCOUNTER — Other Ambulatory Visit: Payer: Self-pay

## 2020-07-08 DIAGNOSIS — R410 Disorientation, unspecified: Secondary | ICD-10-CM

## 2020-07-08 DIAGNOSIS — H532 Diplopia: Secondary | ICD-10-CM

## 2020-07-08 DIAGNOSIS — R519 Headache, unspecified: Secondary | ICD-10-CM

## 2020-07-08 DIAGNOSIS — H538 Other visual disturbances: Secondary | ICD-10-CM

## 2020-07-09 ENCOUNTER — Other Ambulatory Visit: Payer: Self-pay | Admitting: Medical

## 2020-07-09 ENCOUNTER — Telehealth: Payer: Self-pay | Admitting: Medical

## 2020-07-09 DIAGNOSIS — M5440 Lumbago with sciatica, unspecified side: Secondary | ICD-10-CM

## 2020-07-09 DIAGNOSIS — R0683 Snoring: Secondary | ICD-10-CM

## 2020-07-09 DIAGNOSIS — Z79899 Other long term (current) drug therapy: Secondary | ICD-10-CM

## 2020-07-09 DIAGNOSIS — G8929 Other chronic pain: Secondary | ICD-10-CM

## 2020-07-09 DIAGNOSIS — R29898 Other symptoms and signs involving the musculoskeletal system: Secondary | ICD-10-CM

## 2020-07-09 NOTE — Telephone Encounter (Signed)
Please call pt about MRI results She said she spoke to someone yesterday and something was mentioned about some discrepancies and she thought someone was going to call her back

## 2020-07-09 NOTE — Telephone Encounter (Signed)
Recv'd response from [facility] they need prescription expenses.  Called pt and informed

## 2020-07-10 NOTE — Telephone Encounter (Signed)
Sandra Ruiz with [employer] is on vacation until July 11th. I have asked that he call me back when he comes back from vacation

## 2020-07-10 NOTE — Telephone Encounter (Signed)
Pt was notified of results

## 2020-07-14 ENCOUNTER — Other Ambulatory Visit: Payer: Medicare HMO

## 2020-07-14 NOTE — Progress Notes (Signed)
[facility] Summary Report / Loop Recorder 

## 2020-07-14 NOTE — Telephone Encounter (Signed)
Per Catalina Antigua with [employer], Pt had sleep study on March 15, 2020 and [employer] receieved cpap order, however there is no office notes prior to the sleep study that noted why she needs sleep study, so insurance will not pay for a cpap machine unless this is documented. Please advise.

## 2020-07-15 DIAGNOSIS — R400 Somnolence: Secondary | ICD-10-CM | POA: Insufficient documentation

## 2020-07-15 NOTE — Telephone Encounter (Signed)
I will send updated notes to matt at [employer]

## 2020-07-24 LAB — CUP PACEART REMOTE DEVICE CHECK
Date Time Interrogation Session: 20220719042534
Implantable Pulse Generator Implant Date: 20191104

## 2020-07-28 ENCOUNTER — Ambulatory Visit (INDEPENDENT_AMBULATORY_CARE_PROVIDER_SITE_OTHER): Payer: Medicare HMO

## 2020-07-28 DIAGNOSIS — I4819 Other persistent atrial fibrillation: Secondary | ICD-10-CM

## 2020-08-18 ENCOUNTER — Other Ambulatory Visit (HOSPITAL_COMMUNITY): Payer: Self-pay

## 2020-08-18 MED ORDER — APIXABAN 5 MG PO TABS: 5.0000 mg | ORAL_TABLET | Freq: Two times a day (BID) | ORAL | 0 refills | Status: DC

## 2020-08-22 NOTE — Progress Notes (Signed)
[facility] Summary Report / Loop Recorder 

## 2020-08-26 ENCOUNTER — Other Ambulatory Visit: Payer: Self-pay

## 2020-08-26 ENCOUNTER — Ambulatory Visit (INDEPENDENT_AMBULATORY_CARE_PROVIDER_SITE_OTHER): Payer: Medicare HMO | Admitting: Medical

## 2020-08-26 VITALS — BP 120/62 | HR 98 | Ht 61.0 in | Wt 220.2 lb

## 2020-08-26 DIAGNOSIS — M549 Dorsalgia, unspecified: Secondary | ICD-10-CM

## 2020-08-26 DIAGNOSIS — I4819 Other persistent atrial fibrillation: Secondary | ICD-10-CM

## 2020-08-26 DIAGNOSIS — Z1211 Encounter for screening for malignant neoplasm of colon: Secondary | ICD-10-CM

## 2020-08-26 DIAGNOSIS — Z79899 Other long term (current) drug therapy: Secondary | ICD-10-CM | POA: Diagnosis not present

## 2020-08-26 DIAGNOSIS — Z599 Problem related to housing and economic circumstances, unspecified: Secondary | ICD-10-CM

## 2020-08-26 DIAGNOSIS — IMO0001 Reserved for inherently not codable concepts without codable children: Secondary | ICD-10-CM

## 2020-08-26 DIAGNOSIS — K760 Fatty (change of) liver, not elsewhere classified: Secondary | ICD-10-CM | POA: Diagnosis not present

## 2020-08-26 DIAGNOSIS — G4733 Obstructive sleep apnea (adult) (pediatric): Secondary | ICD-10-CM

## 2020-08-26 DIAGNOSIS — Z23 Encounter for immunization: Secondary | ICD-10-CM | POA: Diagnosis not present

## 2020-08-26 DIAGNOSIS — Z0000 Encounter for general adult medical examination without abnormal findings: Secondary | ICD-10-CM

## 2020-08-26 DIAGNOSIS — Z8673 Personal history of transient ischemic attack (TIA), and cerebral infarction without residual deficits: Secondary | ICD-10-CM | POA: Diagnosis not present

## 2020-08-26 DIAGNOSIS — I152 Hypertension secondary to endocrine disorders: Secondary | ICD-10-CM

## 2020-08-26 DIAGNOSIS — E785 Hyperlipidemia, unspecified: Secondary | ICD-10-CM

## 2020-08-26 DIAGNOSIS — E1159 Type 2 diabetes mellitus with other circulatory complications: Secondary | ICD-10-CM

## 2020-08-26 DIAGNOSIS — M542 Cervicalgia: Secondary | ICD-10-CM

## 2020-08-26 DIAGNOSIS — F321 Major depressive disorder, single episode, moderate: Secondary | ICD-10-CM

## 2020-08-26 DIAGNOSIS — Z789 Other specified health status: Secondary | ICD-10-CM

## 2020-08-26 DIAGNOSIS — E1169 Type 2 diabetes mellitus with other specified complication: Secondary | ICD-10-CM

## 2020-08-26 DIAGNOSIS — D649 Anemia, unspecified: Secondary | ICD-10-CM

## 2020-08-26 DIAGNOSIS — M546 Pain in thoracic spine: Secondary | ICD-10-CM

## 2020-08-26 DIAGNOSIS — E039 Hypothyroidism, unspecified: Secondary | ICD-10-CM | POA: Diagnosis not present

## 2020-08-26 DIAGNOSIS — Z531 Procedure and treatment not carried out because of patient's decision for reasons of belief and group pressure: Secondary | ICD-10-CM

## 2020-08-26 DIAGNOSIS — Z7189 Other specified counseling: Secondary | ICD-10-CM

## 2020-08-26 DIAGNOSIS — E041 Nontoxic single thyroid nodule: Secondary | ICD-10-CM

## 2020-08-26 DIAGNOSIS — G8929 Other chronic pain: Secondary | ICD-10-CM

## 2020-08-26 DIAGNOSIS — I10 Essential (primary) hypertension: Secondary | ICD-10-CM

## 2020-08-26 NOTE — Progress Notes (Signed)
Subjective:    Sandra Ruiz is a 75 y.o. female who presents for Preventative Services visit and chronic medical problems/med check visit.    Primary Care Provider Tyera Hansley, Camelia Eng, PA-C here for primary care  Current Health Care Team: Dentist, Dr. Tyson Dense doctor, Dr. Velta Addison but can't remember name Dr. Blair Hailey, pain management/neurosurgery Dr. Kirk Ruths, cardiology Dr. Renato Shin, endocrinology Dr. Devonne Doughty, opthalmology  Medical Services you may have received from other than [facility] providers in the past year (date may be approximate) Dr. Ron Parker- oral Dr. Davy Pique- pain management Afib- Dr. Rayann Heman, Dr. Stanford Breed Dr. Loanne Drilling- endo  Exercise Current exercise habits:  stretches in am    Nutrition/Diet Current diet:  in general, an "unhealthy" diet, diabetic   Depression Screen Depression screen [facility] 2/9 08/26/2020  Decreased Interest 1  Down, Depressed, Hopeless 1  PHQ - 2 Score 2  Altered sleeping 3  Tired, decreased energy 0  Change in appetite 3  Feeling bad or failure about yourself  3  Trouble concentrating 3  Moving slowly or fidgety/restless 0  Suicidal thoughts 0  PHQ-9 Score 14  Difficult doing work/chores Somewhat difficult    Activities of Daily Living Screen/Functional Status Survey Is the patient deaf or have difficulty hearing?: No Does the patient have difficulty seeing, even when wearing glasses/contacts?: Yes Does the patient have difficulty concentrating, remembering, or making decisions?: No Does the patient have difficulty walking or climbing stairs?: Yes Does the patient have difficulty dressing or bathing?: No Does the patient have difficulty doing errands alone such as visiting a doctor's office or shopping?: No  Can patient draw a clock face showing 3:15 oclock, yes  Fall Risk Screen Fall Risk  08/26/2020 05/01/2020 05/02/2019 01/02/2019  Falls in the past year? 1 0 1 1  Number falls in past yr: 0 0 1 1  Injury with Fall? 1 0  0 0  Risk for fall due to : Other (Comment) No Fall Risks - -  Risk for fall due to: Comment fell out of bed - - -  Follow up Falls evaluation completed Falls evaluation completed - -    Gait Assessment: Normal gait observed yes  Advanced directives Does patient have a [place]? No Does patient have a Living Will? No   Past Medical History:  Diagnosis Date   Chronic pain    Depression    Diabetes mellitus without complication (HCC)    GERD (gastroesophageal reflux disease)    History of TIA (transient ischemic attack)    Hyperlipidemia    Hyperopia of both eyes with astigmatism and presbyopia 12/21/2019   Hypertension    Lyme disease    Paroxysmal atrial fibrillation (HCC)    Thyroid disease     Past Surgical History:  Procedure Laterality Date   ATRIAL FIBRILLATION ABLATION     x2 in [place]     implantable loop recorder placement  11/07/2017   MDT [id number] implanted in [place] for afib management by Dr Margaretha Glassing SPINE SURGERY     TONSILLECTOMY AND ADENOIDECTOMY      Social History   Socioeconomic History   Marital status: Married    Spouse name: Not on file   Number of children: Not on file   Years of education: Not on file   Highest education level: Not on file  Occupational History   Not on file  Tobacco Use  Smoking status: Former   Smokeless tobacco: Never  Substance and Sexual Activity   Alcohol use: Yes    Alcohol/week: 2.0 standard drinks    Types: 2 Standard drinks or equivalent per week    Comment: mix drinks occ.   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 [place]  07/2019   Social Determinants of Health   Financial Resource Strain: Not on file  Food Insecurity: Not on file  Transportation Needs: Not on file  Physical  Activity: Not on file  Stress: Not on file  Social Connections: Not on file  Intimate Partner Violence: Not on file    Family History  Problem Relation Age of Onset   CAD Mother    Thyroid disease Mother    CAD Father    Hypothyroidism Daughter      Current Outpatient Medications:    ACCU-CHEK GUIDE test strip, TEST 1 - 2 TIMES DAILY, Disp: 200 strip, Rfl: 0   Accu-Chek Softclix Lancets lancets, CHECK BLOOD SUGAR ONE TO TWO TIMES DAILY., Disp: 200 each, Rfl: 2   ALPRAZolam (XANAX) 0.25 MG tablet, Take 1 tablet (0.25 mg total) by mouth 2 (two) times daily as needed for anxiety., Disp: 20 tablet, Rfl: 0   apixaban (ELIQUIS) 5 MG TABS tablet, Take 1 tablet (5 mg total) by mouth 2 (two) times daily., Disp: 56 tablet, Rfl: 0   Blood Glucose Monitoring Suppl (ACCU-CHEK GUIDE ME) w/Device KIT, Test 1-2 times daily, Disp: 1 kit, Rfl: 0   carvedilol (COREG) 25 MG tablet, TAKE 1 TABLET TWICE DAILY WITH MEALS, Disp: 180 tablet, Rfl: 0   DILT-XR 180 MG 24 hr capsule, TAKE 1 CAPSULE (180 MG TOTAL) BY MOUTH DAILY., Disp: 90 capsule, Rfl: 0   DULoxetine (CYMBALTA) 60 MG capsule, Take 1 capsule (60 mg total) by mouth 2 (two) times daily., Disp: 180 capsule, Rfl: 0   ezetimibe (ZETIA) 10 MG tablet, Take 1 tablet (10 mg total) by mouth daily., Disp: 90 tablet, Rfl: 3   gabapentin (NEURONTIN) 800 MG tablet, Take 1 tablet (800 mg total) by mouth 3 (three) times daily., Disp: 270 tablet, Rfl: 1   glyBURIDE (DIABETA) 1.25 MG tablet, TAKE 1 TABLET (1.25 MG TOTAL) BY MOUTH EVERY MORNING., Disp: 90 tablet, Rfl: 0   JANUMET 50-1000 MG tablet, TAKE 1 TABLET TWICE DAILY WITH MEALS, Disp: 180 tablet, Rfl: 0   levothyroxine (SYNTHROID) 75 MCG tablet, TAKE 1 TABLET EVERY DAY BEFORE BREAKFAST, Disp: 90 tablet, Rfl: 0   lisinopril (ZESTRIL) 10 MG tablet, Take 1 tablet (10 mg total) by mouth daily., Disp: 90 tablet, Rfl: 3   methocarbamol (ROBAXIN) 500 MG tablet, TAKE 1 TABLET TWICE DAILY, Disp: 180 tablet, Rfl: 0    omeprazole (PRILOSEC) 20 MG capsule, TAKE 1 CAPSULE EVERY DAY, Disp: 90 capsule, Rfl: 0   oxybutynin (DITROPAN-XL) 10 MG 24 hr tablet, TAKE 1 TABLET (10 MG TOTAL) BY MOUTH 2 (TWO) TIMES DAILY., Disp: 180 tablet, Rfl: 0   pravastatin (PRAVACHOL) 40 MG tablet, Take 1 tablet (40 mg total) by mouth every evening., Disp: 90 tablet, Rfl: 3  Allergies  Allergen Reactions   Baclofen Swelling   Crestor [Rosuvastatin]     Extreme myalgias   Lipitor [Atorvastatin]     Extreme myalgias   Verapamil Swelling   Silicone Itching and Rash  History reviewed: allergies, current medications, past family history, past medical history, past social history, past surgical history and problem list  Chronic issues discussed: Compliant with medications but is having hard time affording Eliquis and Janumet.  All the rest of the medicines are reasonably priced.  High blood pressure-compliant with medication  Diabetes-compliant with medication  High cholesterol-compliant with medication  Anticoagulation-compliant with Eliquis but using samples as it is very expensive and  She notes ongoing chronic pain.  Hurts all the time.  Mood is down because of this.  Has financial stress.  She had an easier time with insurance and paying for medical care when she lived in [place].  Her insurance apparently is not as good now where she is having to pay a lot more out-of-pocket.   Acute issues discussed: She is staying constipated.   She has continued to have issues falling out of her bed.  She has an adjustable bed but no bed rails.  She has not moved her bed against the wall.  She would like a shower chair as she does not feel completely safe in the shower.   Objective:      Biometrics BP 120/62   Pulse 98   Ht _0  (1.549 m)   Wt 220 lb 3.2 oz (99.9 kg)   BMI 41.61 kg/m   Cognitive Testing  Alert? Yes  Normal Appearance?Yes  Oriented to person? Yes  Place? Yes   Time? Yes  Recall of three objects?   Yes  Can perform simple calculations? Yes  Displays appropriate judgment?Yes  Can read the correct time from a watch face?Yes  General appearance: alert, no distress, WD/WN, white female  Nutritional Status: Inadequate calore intake? no Loss of muscle mass? no Loss of fat beneath skin? no Localized or general edema? no Diminished functional status? yes  Other pertinent exam: HEENT: normocephalic, sclerae anicteric, TMs pearly, nares patent, no discharge or erythema, pharynx normal Oral cavity: MMM, no lesions Neck: supple, no lymphadenopathy, no thyromegaly, no masses Heart: RRR, normal S1, S2, no murmurs Lungs: CTA bilaterally, no wheezes, rhonchi, or rales Abdomen: +bs, soft, non tender, non distended, no masses, no hepatomegaly, no splenomegaly Musculoskeletal: nontender, no swelling, no obvious deformity Extremities: no edema, no cyanosis, no clubbing Pulses: 2+ symmetric, upper and lower extremities, normal cap refill Neurological: alert, oriented x 3, CN2-12 intact, strength normal upper extremities and lower extremities, sensation normal throughout, DTRs 2+ throughout, no cerebellar signs, gait normal Psychiatric: normal affect, behavior normal, pleasant  Breast/gyn - deferred/declined    Assessment:   Encounter Diagnoses  Name Primary?   Encounter for health maintenance examination in adult Yes   Medicare annual wellness visit, subsequent    Persistent atrial fibrillation (HCC)    OSA (obstructive sleep apnea)    Hypothyroidism, unspecified type    Hypertension associated with diabetes ([place])    Hyperlipidemia, unspecified hyperlipidemia type    History of TIA (transient ischemic attack)    High risk medication use    Fatty liver    Essential hypertension, benign    Depression, major, single episode, moderate (HCC)    Type 2 diabetes mellitus with other specified complication, without long-term current use of insulin (HCC)    Bilateral thoracic back pain,  unspecified chronicity    Chronic anemia    Chronic bilateral back pain, unspecified back location    Chronic neck pain    Thyroid nodule    Statin intolerance    Screening for colon cancer  Need for prophylactic vaccination against Streptococcus pneumoniae (pneumococcus)    Advanced directives, counseling/discussion    Patient is Jehovah's Witness    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  During the course of the visit today, we discussed and counseled about appropriate screening and preventive services.  A health risk assessment was established today that included a review of current medications, allergies, social history, family history, medical and preventative health history, biometrics, and preventative screenings to identify potential safety concerns or impairments.  A personalized plan was printed today for your records and use.   Personalized health advice and education was given today to reduce health risks and promote self management and wellness.  Information regarding end of life planning was discussed today.  This visit was a preventative care visit, also known as wellness visit or routine physical.   Topics typically include healthy lifestyle, diet, exercise, preventative care, vaccinations, sick and well care, proper use of emergency dept and after hours care, as well as other concerns.     Recommendations: Continue to return yearly for your annual wellness and preventative care visits.  This gives [place] a chance to discuss healthy lifestyle, exercise, vaccinations, review your chart record, and perform screenings where appropriate.  I recommend you see your eye doctor yearly for routine vision care.  I recommend you see your dentist yearly for routine dental care including hygiene visits twice yearly.   Vaccination recommendations were  reviewed Immunization History  Administered Date(s) Administered   Fluad Quad(high Dose 65+) 11/01/2019   Alphonsa Overall (J&J) SARS-COV-2 Vaccination 03/18/2019   Moderna Sars-Covid-2 Vaccination 11/01/2019   Pneumococcal Conjugate-13 08/26/2020    I recommend you check insurance coverage for shingrix and tetanus vaccines  Get a yearly flu shot in the fall  Counseled on the pneumococcal vaccine.  Vaccine information sheet given.  Pneumococcal vaccine Prevnar 13 given after consent obtained.    Screening for cancer: Colon cancer screening: Referral to [facility] at her request.   We will need to cancel [facility] consult pending.  Breast cancer screening: You should perform a self breast exam monthly.   We reviewed recommendations for regular mammograms and breast cancer screening.   Skin cancer screening: Check your skin regularly for new changes, growing lesions, or other lesions of concern Come in for evaluation if you have skin lesions of concern.  Lung cancer screening: If you have a greater than 20 pack year history of tobacco use, then you may qualify for lung cancer screening with a chest CT scan.   Please call your insurance company to inquire about coverage for this test.  We currently don't have screenings for other cancers besides breast, cervical, colon, and lung cancers.  If you have a strong family history of cancer or have other cancer screening concerns, please let me know.    Bone health: Get at least 150 minutes of aerobic exercise weekly Get weight bearing exercise at least once weekly Bone density test:  A bone density test is an imaging test that uses a type of X-ray to measure the amount of calcium and other minerals in your bones. The test may be used to diagnose or screen you for a condition that causes weak or thin bones (osteoporosis), predict your risk for a broken bone (fracture), or determine how well your osteoporosis treatment is working. The bone  density test is recommended  for females 37 and older, or females or males <70 if certain risk factors such as thyroid disease, long term use of steroids such as for asthma or rheumatological issues, vitamin D deficiency, estrogen deficiency, family history of osteoporosis, self or family history of fragility fracture in first degree relative.   Your bone density test 02/2020 was normal!   Heart health: Get at least 150 minutes of aerobic exercise weekly Limit alcohol It is important to maintain a healthy blood pressure and healthy cholesterol numbers  Heart disease screening: Screening for heart disease includes screening for blood pressure, fasting lipids, glucose/diabetes screening, BMI height to weight ratio, reviewed of smoking status, physical activity, and diet.    Goals include blood pressure 120/80 or less, maintaining a healthy lipid/cholesterol profile, preventing diabetes or keeping diabetes numbers under good control, not smoking or using tobacco products, exercising most days per week or at least 150 minutes per week of exercise, and eating healthy variety of fruits and vegetables, healthy oils, and avoiding unhealthy food choices like fried food, fast food, high sugar and high cholesterol foods.      Medical care options: I recommend you continue to seek care here first for routine care.  We try really hard to have available appointments Monday through Friday daytime hours for sick visits, acute visits, and physicals.  Urgent care should be used for after hours and weekends for significant issues that cannot wait till the next day.  The emergency department should be used for significant potentially life-threatening emergencies.  The emergency department is expensive, can often have long wait times for less significant concerns, so try to utilize primary care, urgent care, or telemedicine when possible to avoid unnecessary trips to the emergency department.  Virtual visits and  telemedicine have been introduced since the pandemic started in 2020, and can be convenient ways to receive medical care.  We offer virtual appointments as well to assist you in a variety of options to seek medical care.   Advanced Directives: I recommend you consider completing a [place] and Living Will.   These documents respect your wishes and help alleviate burdens on your loved ones if you were to become terminally ill or be in a position to need those documents enforced.    You can complete Advanced Directives yourself, have them notarized, then have copies made for our office, for you and for anybody you feel should have them in safe keeping.  Or, you can have an attorney prepare these documents.   If you haven't updated your Last Will and Testament in a while, it may be worthwhile having an attorney prepare these documents together and save on some costs.       Specific other issues: Falls-I recommended she have her children or friend help move her bed against the wall so she can help prevent falling out of bed.  I also recommend she call the manufacturer of her bed to see if she can get bed rails installed since it is a special order type bed  Financial strain-referral to [place] work coordination of care.  I will work to find some other solutions in regards to Eliquis and find alternate to Janumet  I will write a prescription shower chair  High blood pressure-continue current medications  Diabetes-labs today, continue current medication  Hyperlipidemia-continue current medication  Hypothyroidism-continue current medication    Kiara was seen today for cpe/ awv.  Diagnoses and all orders for this visit:  Encounter for  health maintenance examination in adult -     Comprehensive metabolic panel -     Hemoglobin A1c -     CBC with Differential/Platelet  Medicare annual wellness visit, subsequent -     AMB Referral to [facility]  Persistent atrial fibrillation (HCC)  OSA (obstructive sleep apnea)  Hypothyroidism, unspecified type  Hypertension associated with diabetes ([place])  Hyperlipidemia, unspecified hyperlipidemia type  History of TIA (transient ischemic attack)  High risk medication use -     Comprehensive metabolic panel -     CBC with Differential/Platelet  Fatty liver -     Comprehensive metabolic panel  Essential hypertension, benign  Depression, major, single episode, moderate (HCC)  Type 2 diabetes mellitus with other specified complication, without long-term current use of insulin (HCC) -     Comprehensive metabolic panel -     Hemoglobin A1c  Bilateral thoracic back pain, unspecified chronicity  Chronic anemia  Chronic bilateral back pain, unspecified back location  Chronic neck pain  Thyroid nodule  Statin intolerance  Screening for colon cancer  Need for prophylactic vaccination against Streptococcus pneumoniae (pneumococcus) -     Pneumococcal conjugate vaccine 13-valent  Advanced directives, counseling/discussion -     AMB Referral to [facility]  Patient is Jehovah's Witness  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  During the course of the visit the patient was educated and counseled about appropriate screening and preventive services.  A health risk assessment was established with the patient that included a review of current medications, allergies, social history, family history, medical and preventative health history, biometrics, and preventative screenings to identify potential safety concerns or impairments.  A personalized plan was printed today for the patient's records and use.   Personalized health advice and education was given today to reduce health  risks and promote self management and wellness.  Information regarding end of life planning was discussed today.  Dorothea Ogle, PA-C   08/27/2020

## 2020-08-27 ENCOUNTER — Telehealth: Payer: Self-pay | Admitting: Medical

## 2020-08-27 ENCOUNTER — Other Ambulatory Visit: Payer: Self-pay | Admitting: Medical

## 2020-08-27 DIAGNOSIS — Z23 Encounter for immunization: Secondary | ICD-10-CM | POA: Insufficient documentation

## 2020-08-27 DIAGNOSIS — Z789 Other specified health status: Secondary | ICD-10-CM | POA: Insufficient documentation

## 2020-08-27 DIAGNOSIS — D649 Anemia, unspecified: Secondary | ICD-10-CM

## 2020-08-27 DIAGNOSIS — K760 Fatty (change of) liver, not elsewhere classified: Secondary | ICD-10-CM

## 2020-08-27 DIAGNOSIS — IMO0001 Reserved for inherently not codable concepts without codable children: Secondary | ICD-10-CM | POA: Insufficient documentation

## 2020-08-27 DIAGNOSIS — Z531 Procedure and treatment not carried out because of patient's decision for reasons of belief and group pressure: Secondary | ICD-10-CM | POA: Insufficient documentation

## 2020-08-27 DIAGNOSIS — Z599 Problem related to housing and economic circumstances, unspecified: Secondary | ICD-10-CM | POA: Insufficient documentation

## 2020-08-27 DIAGNOSIS — Z1211 Encounter for screening for malignant neoplasm of colon: Secondary | ICD-10-CM

## 2020-08-27 DIAGNOSIS — M48062 Spinal stenosis, lumbar region with neurogenic claudication: Secondary | ICD-10-CM | POA: Diagnosis not present

## 2020-08-27 LAB — COMPREHENSIVE METABOLIC PANEL
ALT: 17 IU/L (ref 0–32)
AST: 24 IU/L (ref 0–40)
Albumin/Globulin Ratio: 1.9 (ref 1.2–2.2)
Albumin: 4.2 g/dL (ref 3.7–4.7)
Alkaline Phosphatase: 124 IU/L — ABNORMAL HIGH (ref 44–121)
BUN/Creatinine Ratio: 16 (ref 12–28)
BUN: 12 mg/dL (ref 8–27)
Bilirubin Total: 0.3 mg/dL (ref 0.0–1.2)
CO2: 22 mmol/L (ref 20–29)
Calcium: 9.3 mg/dL (ref 8.7–10.3)
Chloride: 103 mmol/L (ref 96–106)
Creatinine, Ser: 0.76 mg/dL (ref 0.57–1.00)
Globulin, Total: 2.2 g/dL (ref 1.5–4.5)
Glucose: 86 mg/dL (ref 65–99)
Potassium: 5 mmol/L (ref 3.5–5.2)
Sodium: 143 mmol/L (ref 134–144)
Total Protein: 6.4 g/dL (ref 6.0–8.5)
eGFR: 83 mL/min/{1.73_m2} (ref >59)

## 2020-08-27 LAB — CBC WITH DIFFERENTIAL/PLATELET
Basophils Absolute: 0.1 10*3/uL (ref 0.0–0.2)
Basos: 1 %
EOS (ABSOLUTE): 0.1 10*3/uL (ref 0.0–0.4)
Eos: 2 %
Hematocrit: 32.7 % — ABNORMAL LOW (ref 34.0–46.6)
Hemoglobin: 10.2 g/dL — ABNORMAL LOW (ref 11.1–15.9)
Immature Grans (Abs): 0 10*3/uL (ref 0.0–0.1)
Immature Granulocytes: 0 %
Lymphocytes Absolute: 1.6 10*3/uL (ref 0.7–3.1)
Lymphs: 23 %
MCH: 27.9 pg (ref 26.6–33.0)
MCHC: 31.2 g/dL — ABNORMAL LOW (ref 31.5–35.7)
MCV: 89 fL (ref 79–97)
Monocytes Absolute: 0.4 10*3/uL (ref 0.1–0.9)
Monocytes: 6 %
Neutrophils Absolute: 4.9 10*3/uL (ref 1.4–7.0)
Neutrophils: 68 %
Platelets: 294 10*3/uL (ref 150–450)
RBC: 3.66 x10E6/uL — ABNORMAL LOW (ref 3.77–5.28)
RDW: 13.6 % (ref 11.7–15.4)
WBC: 7.1 10*3/uL (ref 3.4–10.8)

## 2020-08-27 LAB — HEMOGLOBIN A1C
Est. average glucose Bld gHb Est-mCnc: 117 mg/dL
Hgb A1c MFr Bld: 5.7 % — ABNORMAL HIGH (ref 4.8–5.6)

## 2020-08-27 MED ORDER — LEVOTHYROXINE SODIUM 75 MCG PO TABS: ORAL_TABLET | ORAL | 3 refills | Status: DC

## 2020-08-27 MED ORDER — DULOXETINE HCL 60 MG PO CPEP: 60.0000 mg | ORAL_CAPSULE | Freq: Two times a day (BID) | ORAL | 1 refills | Status: DC

## 2020-08-27 MED ORDER — OMEPRAZOLE 20 MG PO CPDR: DELAYED_RELEASE_CAPSULE | ORAL | 3 refills | Status: DC

## 2020-08-27 MED ORDER — CARVEDILOL 25 MG PO TABS: 25.0000 mg | ORAL_TABLET | Freq: Two times a day (BID) | ORAL | 3 refills | Status: DC

## 2020-08-27 MED ORDER — GABAPENTIN 800 MG PO TABS: 800.0000 mg | ORAL_TABLET | Freq: Three times a day (TID) | ORAL | 1 refills | Status: DC

## 2020-08-27 MED ORDER — METHOCARBAMOL 500 MG PO TABS: 500.0000 mg | ORAL_TABLET | Freq: Two times a day (BID) | ORAL | 1 refills | Status: DC

## 2020-08-27 MED ORDER — METFORMIN HCL ER 750 MG PO TB24: 750.0000 mg | ORAL_TABLET | Freq: Every day | ORAL | 3 refills | Status: DC

## 2020-08-27 MED ORDER — DILTIAZEM HCL ER 180 MG PO CP24: ORAL_CAPSULE | ORAL | 3 refills | Status: DC

## 2020-08-27 MED ORDER — EZETIMIBE 10 MG PO TABS: 10.0000 mg | ORAL_TABLET | Freq: Every day | ORAL | 3 refills | Status: DC

## 2020-08-27 MED ORDER — GLYBURIDE 1.25 MG PO TABS: 1.2500 mg | ORAL_TABLET | Freq: Every morning | ORAL | 3 refills | Status: DC

## 2020-08-27 MED ORDER — OXYBUTYNIN CHLORIDE ER 10 MG PO TB24: 10.0000 mg | ORAL_TABLET | Freq: Two times a day (BID) | ORAL | 3 refills | Status: DC

## 2020-08-27 NOTE — Telephone Encounter (Signed)
She needs help with Eliquis script, too expensive.   Any suggestions with insurance , coupon cards

## 2020-08-27 NOTE — Telephone Encounter (Signed)
See other message  She was concerned about cost with Janumet.  Since her sugar numbers are ok and stable, I am going to stop Janumet and change to Metformin 750 mg XR daily.    This should be cheap.  If not , let me know

## 2020-08-27 NOTE — Telephone Encounter (Signed)
1) I put in referral to [facility] for social worker/home health eval, assess need, help with financial and other stressors  2) I'd like to d/c referral to [facility] and change to referral to [facility] since her daughter works there  3) lets help to get advanced directive copy here if she hasn't gotten this in place  4)  I need to send Rx for shower chair to home health or pharmacy

## 2020-08-28 NOTE — Telephone Encounter (Signed)
Left message for pt to call me back 

## 2020-08-28 NOTE — Telephone Encounter (Signed)
Pt was notified of results.  1- [facility] referral is in. 2- [facility] is in 3- Advance Directives are in 4- shower chair order faxed to [facility]  I advised her about labs and change in medication

## 2020-09-01 ENCOUNTER — Ambulatory Visit (INDEPENDENT_AMBULATORY_CARE_PROVIDER_SITE_OTHER): Payer: Medicare HMO

## 2020-09-01 DIAGNOSIS — I4819 Other persistent atrial fibrillation: Secondary | ICD-10-CM

## 2020-09-02 LAB — CUP PACEART REMOTE DEVICE CHECK
Date Time Interrogation Session: 20220821042645
Implantable Pulse Generator Implant Date: 20191104

## 2020-09-03 NOTE — Telephone Encounter (Signed)
Called [employer] t# [phone] to check on PT ASSISTANCE JANUMET and they stated they mailed a form to patient to complete and haven't gotten it back.  Pt hasn't received.  They will mail it to the office.  Pt informed and samples given to hold pt.  She doesn't want to switch to Metformin unless she has to.

## 2020-09-03 NOTE — Telephone Encounter (Signed)
Called [facility] [phone] and patient needs $241 in out of pocket pharmacy expenses for her and husband.  I had sent what she had given me which was only for May and that wasn't enough.  Called pt and she will call [employer] and go to [facility] and get year to date out of pocket for her and husband and bring to office.

## 2020-09-05 NOTE — Telephone Encounter (Signed)
See telephone call regarding trying to get pt assistance approved for Eliquis

## 2020-09-06 ENCOUNTER — Encounter (HOSPITAL_BASED_OUTPATIENT_CLINIC_OR_DEPARTMENT_OTHER): Payer: Self-pay | Admitting: Emergency Medicine

## 2020-09-06 ENCOUNTER — Other Ambulatory Visit: Payer: Self-pay

## 2020-09-06 ENCOUNTER — Observation Stay (HOSPITAL_BASED_OUTPATIENT_CLINIC_OR_DEPARTMENT_OTHER)
Admission: EM | Admit: 2020-09-06 | Discharge: 2020-09-07 | Disposition: A | Discharge status: Home / Self Care | Payer: Medicare HMO | Attending: Family Medicine | Admitting: Family Medicine

## 2020-09-06 ENCOUNTER — Emergency Department (HOSPITAL_BASED_OUTPATIENT_CLINIC_OR_DEPARTMENT_OTHER): Payer: Medicare HMO

## 2020-09-06 DIAGNOSIS — I5032 Chronic diastolic (congestive) heart failure: Secondary | ICD-10-CM | POA: Insufficient documentation

## 2020-09-06 DIAGNOSIS — Z20822 Contact with and (suspected) exposure to covid-19: Secondary | ICD-10-CM | POA: Diagnosis not present

## 2020-09-06 DIAGNOSIS — I4819 Other persistent atrial fibrillation: Secondary | ICD-10-CM | POA: Diagnosis present

## 2020-09-06 DIAGNOSIS — Z87891 Personal history of nicotine dependence: Secondary | ICD-10-CM | POA: Insufficient documentation

## 2020-09-06 DIAGNOSIS — I110 Hypertensive heart disease with heart failure: Secondary | ICD-10-CM | POA: Diagnosis not present

## 2020-09-06 DIAGNOSIS — E119 Type 2 diabetes mellitus without complications: Secondary | ICD-10-CM | POA: Diagnosis not present

## 2020-09-06 DIAGNOSIS — D649 Anemia, unspecified: Secondary | ICD-10-CM | POA: Diagnosis present

## 2020-09-06 DIAGNOSIS — I200 Unstable angina: Secondary | ICD-10-CM | POA: Diagnosis not present

## 2020-09-06 DIAGNOSIS — G4733 Obstructive sleep apnea (adult) (pediatric): Secondary | ICD-10-CM | POA: Diagnosis present

## 2020-09-06 DIAGNOSIS — E039 Hypothyroidism, unspecified: Secondary | ICD-10-CM | POA: Diagnosis not present

## 2020-09-06 DIAGNOSIS — Z79899 Other long term (current) drug therapy: Secondary | ICD-10-CM | POA: Insufficient documentation

## 2020-09-06 DIAGNOSIS — R42 Dizziness and giddiness: Secondary | ICD-10-CM | POA: Diagnosis not present

## 2020-09-06 DIAGNOSIS — I480 Paroxysmal atrial fibrillation: Secondary | ICD-10-CM | POA: Insufficient documentation

## 2020-09-06 DIAGNOSIS — J9811 Atelectasis: Secondary | ICD-10-CM | POA: Diagnosis not present

## 2020-09-06 DIAGNOSIS — R079 Chest pain, unspecified: Secondary | ICD-10-CM | POA: Diagnosis not present

## 2020-09-06 DIAGNOSIS — Z7901 Long term (current) use of anticoagulants: Secondary | ICD-10-CM | POA: Insufficient documentation

## 2020-09-06 DIAGNOSIS — Z8673 Personal history of transient ischemic attack (TIA), and cerebral infarction without residual deficits: Secondary | ICD-10-CM | POA: Insufficient documentation

## 2020-09-06 DIAGNOSIS — I10 Essential (primary) hypertension: Secondary | ICD-10-CM | POA: Diagnosis present

## 2020-09-06 DIAGNOSIS — Z7984 Long term (current) use of oral hypoglycemic drugs: Secondary | ICD-10-CM | POA: Insufficient documentation

## 2020-09-06 DIAGNOSIS — E785 Hyperlipidemia, unspecified: Secondary | ICD-10-CM | POA: Diagnosis present

## 2020-09-06 DIAGNOSIS — F321 Major depressive disorder, single episode, moderate: Secondary | ICD-10-CM | POA: Diagnosis present

## 2020-09-06 LAB — CBC WITH DIFFERENTIAL/PLATELET
Abs Immature Granulocytes: 0.06 10*3/uL (ref 0.00–0.07)
Basophils Absolute: 0 10*3/uL (ref 0.0–0.1)
Basophils Relative: 0 %
Eosinophils Absolute: 0.1 10*3/uL (ref 0.0–0.5)
Eosinophils Relative: 1 %
HCT: 33.5 % — ABNORMAL LOW (ref 36.0–46.0)
Hemoglobin: 10.8 g/dL — ABNORMAL LOW (ref 12.0–15.0)
Immature Granulocytes: 1 %
Lymphocytes Relative: 24 %
Lymphs Abs: 2.2 10*3/uL (ref 0.7–4.0)
MCH: 28.2 pg (ref 26.0–34.0)
MCHC: 32.2 g/dL (ref 30.0–36.0)
MCV: 87.5 fL (ref 80.0–100.0)
Monocytes Absolute: 0.8 10*3/uL (ref 0.1–1.0)
Monocytes Relative: 8 %
Neutro Abs: 6.1 10*3/uL (ref 1.7–7.7)
Neutrophils Relative %: 66 %
Platelets: 352 10*3/uL (ref 150–400)
RBC: 3.83 MIL/uL — ABNORMAL LOW (ref 3.87–5.11)
RDW: 13.8 % (ref 11.5–15.5)
WBC: 9.3 10*3/uL (ref 4.0–10.5)
nRBC: 0 % (ref 0.0–0.2)

## 2020-09-06 LAB — GLUCOSE, CAPILLARY: Glucose-Capillary: 106 mg/dL — ABNORMAL HIGH (ref 70–99)

## 2020-09-06 LAB — BASIC METABOLIC PANEL
Anion gap: 8 (ref 5–15)
BUN: 21 mg/dL (ref 8–23)
CO2: 25 mmol/L (ref 22–32)
Calcium: 9.6 mg/dL (ref 8.9–10.3)
Chloride: 101 mmol/L (ref 98–111)
Creatinine, Ser: 0.66 mg/dL (ref 0.44–1.00)
GFR, Estimated: >60 mL/min (ref >60)
Glucose, Bld: 68 mg/dL — ABNORMAL LOW (ref 70–99)
Potassium: 4.2 mmol/L (ref 3.5–5.1)
Sodium: 134 mmol/L — ABNORMAL LOW (ref 135–145)

## 2020-09-06 LAB — TROPONIN I (HIGH SENSITIVITY)
Troponin I (High Sensitivity): 2 ng/L (ref <18)
Troponin I (High Sensitivity): 3 ng/L (ref <18)

## 2020-09-06 LAB — RESP PANEL BY RT-PCR (FLU A&B, COVID) ARPGX2
Influenza A by PCR: NEGATIVE
Influenza B by PCR: NEGATIVE
SARS Coronavirus 2 by RT PCR: NEGATIVE

## 2020-09-06 LAB — APTT
aPTT: 28 seconds (ref 24–36)
aPTT: 35 seconds (ref 24–36)

## 2020-09-06 LAB — PROTIME-INR
INR: 1.1 (ref 0.8–1.2)
Prothrombin Time: 14.6 seconds (ref 11.4–15.2)

## 2020-09-06 LAB — HEPARIN LEVEL (UNFRACTIONATED): Heparin Unfractionated: >1.1 IU/mL — ABNORMAL HIGH (ref 0.30–0.70)

## 2020-09-06 MED ORDER — ONDANSETRON HCL 4 MG/2ML IJ SOLN: 4.0000 mg | Freq: Four times a day (QID) | INTRAMUSCULAR | Status: DC | PRN

## 2020-09-06 MED ORDER — ACETAMINOPHEN 325 MG PO TABS: 650.0000 mg | ORAL_TABLET | ORAL | Status: DC | PRN

## 2020-09-06 MED ORDER — ASPIRIN 81 MG PO CHEW
81.0000 mg | CHEWABLE_TABLET | Freq: Every day | ORAL | Status: DC
  Administered 2020-09-07: 81 mg via ORAL
  Filled 2020-09-06: qty 1

## 2020-09-06 MED ORDER — HEPARIN (PORCINE) 25000 UT/250ML-% IV SOLN
1400.0000 [IU]/h | INTRAVENOUS | Status: DC
  Administered 2020-09-06: 950 [IU]/h via INTRAVENOUS
  Filled 2020-09-06: qty 250

## 2020-09-06 MED ORDER — INSULIN ASPART 100 UNIT/ML IJ SOLN
0.0000 [IU] | Freq: Three times a day (TID) | INTRAMUSCULAR | Status: DC
  Administered 2020-09-07 (×2): 1 [IU] via SUBCUTANEOUS

## 2020-09-06 MED ORDER — NITROGLYCERIN 2 % TD OINT
0.5000 [in_us] | TOPICAL_OINTMENT | Freq: Once | TRANSDERMAL | Status: AC
  Administered 2020-09-06: 0.5 [in_us] via TOPICAL
  Filled 2020-09-06: qty 1

## 2020-09-06 MED ORDER — EZETIMIBE 10 MG PO TABS
10.0000 mg | ORAL_TABLET | Freq: Every day | ORAL | Status: DC
  Administered 2020-09-07: 10 mg via ORAL
  Filled 2020-09-06: qty 1

## 2020-09-06 MED ORDER — NITROGLYCERIN IN D5W 200-5 MCG/ML-% IV SOLN
0.0000 ug/min | INTRAVENOUS | Status: DC
  Filled 2020-09-06: qty 250

## 2020-09-06 MED ORDER — ALPRAZOLAM 0.25 MG PO TABS: 0.2500 mg | ORAL_TABLET | Freq: Two times a day (BID) | ORAL | Status: DC | PRN

## 2020-09-06 MED ORDER — LEVOTHYROXINE SODIUM 75 MCG PO TABS
75.0000 ug | ORAL_TABLET | Freq: Every day | ORAL | Status: DC
  Administered 2020-09-07: 75 ug via ORAL
  Filled 2020-09-06: qty 1

## 2020-09-06 MED ORDER — IOHEXOL 350 MG/ML SOLN
100.0000 mL | Freq: Once | INTRAVENOUS | Status: AC | PRN
  Administered 2020-09-06: 100 mL via INTRAVENOUS

## 2020-09-06 MED ORDER — SODIUM CHLORIDE 0.9% FLUSH
3.0000 mL | INTRAVENOUS | Status: DC | PRN
  Administered 2020-09-06: 3 mL via INTRAVENOUS

## 2020-09-06 MED ORDER — SODIUM CHLORIDE 0.9% FLUSH
3.0000 mL | Freq: Two times a day (BID) | INTRAVENOUS | Status: DC
  Administered 2020-09-07: 3 mL via INTRAVENOUS

## 2020-09-06 MED ORDER — CARVEDILOL 25 MG PO TABS
25.0000 mg | ORAL_TABLET | Freq: Two times a day (BID) | ORAL | Status: DC
  Administered 2020-09-07: 25 mg via ORAL
  Filled 2020-09-06: qty 1

## 2020-09-06 MED ORDER — PANTOPRAZOLE SODIUM 40 MG PO TBEC
80.0000 mg | DELAYED_RELEASE_TABLET | Freq: Every day | ORAL | Status: DC
  Administered 2020-09-06 – 2020-09-07 (×2): 80 mg via ORAL
  Filled 2020-09-06 (×2): qty 2

## 2020-09-06 MED ORDER — LISINOPRIL 10 MG PO TABS
10.0000 mg | ORAL_TABLET | Freq: Every day | ORAL | Status: DC
  Administered 2020-09-07: 10 mg via ORAL
  Filled 2020-09-06: qty 1

## 2020-09-06 MED ORDER — SODIUM CHLORIDE 0.9 % IV SOLN: 250.0000 mL | INTRAVENOUS | Status: DC | PRN

## 2020-09-06 MED ORDER — DULOXETINE HCL 60 MG PO CPEP
60.0000 mg | ORAL_CAPSULE | Freq: Two times a day (BID) | ORAL | Status: DC
  Administered 2020-09-06 – 2020-09-07 (×2): 60 mg via ORAL
  Filled 2020-09-06 (×2): qty 1

## 2020-09-06 MED ORDER — DILTIAZEM HCL ER COATED BEADS 180 MG PO CP24
180.0000 mg | ORAL_CAPSULE | Freq: Every day | ORAL | Status: DC
  Administered 2020-09-07: 180 mg via ORAL
  Filled 2020-09-06 (×2): qty 1

## 2020-09-06 MED ORDER — NITROGLYCERIN 0.4 MG SL SUBL: 0.4000 mg | SUBLINGUAL_TABLET | SUBLINGUAL | Status: DC | PRN

## 2020-09-06 MED ORDER — ASPIRIN 81 MG PO CHEW
324.0000 mg | CHEWABLE_TABLET | Freq: Once | ORAL | Status: AC
  Administered 2020-09-06: 324 mg via ORAL
  Filled 2020-09-06: qty 4

## 2020-09-06 NOTE — Consult Note (Signed)
Cardiology Consultation:   Patient ID: BENNETTA RUDDEN MRN: [id number]; DOB: 05-May-1945  Admit date: 09/06/2020 Date of Consult: 09/06/2020  Primary Care Provider: [name], PA-C Primary Cardiologist: None  Primary Electrophysiologist:  None    Patient Profile:   Sandra Ruiz is a 75 y.o. female with a hx of HTN, atrial fib, HLD, T2DM, hypothyroid, OSA who is being seen today for the evaluation of chest pain at the request of hospital medicine.  History of Present Illness:   Sandra Ruiz is a 75 yo F with HTN, HLD, T2DM, afib, hypothyroidism, OSA who presents to the emergency department with chest pain.   Notes that she gets chest pain both with exertion and at rest.  Describes the pain as a stabbing pain radiates to her left scapula and down her arm.  Have some shortness of breath with it.  This has been ongoing for several months, however this was the longest episode that she had which prompted her to come emergency department.  She took one of her husbands sublingual nitroglycerin which relieved some of her pain.  On arrival she was hemodynamically stable and has been weaned off the nitroglycerin infusion that she was started in the emergency department.  Currently not having any chest pain.  Past Medical History:  Diagnosis Date   Chronic pain    Depression    Diabetes mellitus without complication (HCC)    GERD (gastroesophageal reflux disease)    History of TIA (transient ischemic attack)    Hyperlipidemia    Hyperopia of both eyes with astigmatism and presbyopia 12/21/2019   Hypertension    Lyme disease    Paroxysmal atrial fibrillation (HCC)    Thyroid disease     Past Surgical History:  Procedure Laterality Date   ATRIAL FIBRILLATION ABLATION     x2 in [place]     implantable loop recorder placement  11/07/2017   MDT LINQ1 implanted in [place] for afib management by Dr Candiss Norse   LUMBAR SPINE SURGERY     TONSILLECTOMY  AND ADENOIDECTOMY       Home Medications:  Prior to Admission medications   Medication Sig Start Date End Date Taking? Authorizing Provider  ALPRAZolam (XANAX) 0.25 MG tablet Take 1 tablet (0.25 mg total) by mouth 2 (two) times daily as needed for anxiety. 10/11/19  Yes Tysinger, Camelia Eng, PA-C  apixaban (ELIQUIS) 5 MG TABS tablet Take 1 tablet (5 mg total) by mouth 2 (two) times daily. 08/18/20  Yes Fenton, Clint R, PA  carvedilol (COREG) 25 MG tablet Take 1 tablet (25 mg total) by mouth 2 (two) times daily with a meal. 08/27/20  Yes Tysinger, Camelia Eng, PA-C  diltiazem (DILT-XR) 180 MG 24 hr capsule TAKE 1 CAPSULE (180 MG TOTAL) BY MOUTH DAILY. 08/27/20  Yes Tysinger, Camelia Eng, PA-C  DULoxetine (CYMBALTA) 60 MG capsule Take 1 capsule (60 mg total) by mouth 2 (two) times daily. Patient taking differently: Take 60 mg by mouth daily. 08/27/20  Yes Tysinger, Camelia Eng, PA-C  ezetimibe (ZETIA) 10 MG tablet Take 1 tablet (10 mg total) by mouth daily. 08/27/20  Yes Tysinger, Camelia Eng, PA-C  gabapentin (NEURONTIN) 800 MG tablet Take 1 tablet (800 mg total) by mouth 3 (three) times daily. 08/27/20  Yes Tysinger, Camelia Eng, PA-C  glyBURIDE (DIABETA) 1.25 MG tablet Take 1 tablet (1.25 mg total) by mouth every morning. 08/27/20  Yes Tysinger, Camelia Eng, PA-C  levothyroxine (  SYNTHROID) 75 MCG tablet TAKE 1 TABLET EVERY DAY BEFORE BREAKFAST 08/27/20  Yes Tysinger, Camelia Eng, PA-C  lisinopril (ZESTRIL) 10 MG tablet Take 1 tablet (10 mg total) by mouth daily. 05/27/20  Yes Lelon Perla, MD  methocarbamol (ROBAXIN) 500 MG tablet Take 1 tablet (500 mg total) by mouth 2 (two) times daily. 08/27/20  Yes Tysinger, Camelia Eng, PA-C  omeprazole (PRILOSEC) 20 MG capsule TAKE 1 CAPSULE EVERY DAY 08/27/20  Yes Tysinger, Camelia Eng, PA-C  oxybutynin (DITROPAN-XL) 10 MG 24 hr tablet Take 1 tablet (10 mg total) by mouth 2 (two) times daily. 08/27/20  Yes Tysinger, Camelia Eng, PA-C  pravastatin (PRAVACHOL) 40 MG tablet Take 1 tablet (40 mg total) by  mouth every evening. 02/04/20 09/06/20 Yes Tysinger, Camelia Eng, PA-C  sitaGLIPtin-metformin (JANUMET) 50-1000 MG tablet Take 1 tablet by mouth 2 (two) times daily with a meal.   Yes [provider]  ACCU-CHEK GUIDE test strip TEST 1 - 2 TIMES DAILY 09/04/19   Tysinger, Camelia Eng, PA-C  Accu-Chek Softclix Lancets lancets CHECK BLOOD SUGAR ONE TO TWO TIMES DAILY. 03/03/20   Tysinger, Camelia Eng, PA-C  Blood Glucose Monitoring Suppl (ACCU-CHEK GUIDE ME) w/Device KIT Test 1-2 times daily 05/09/19   Tysinger, Camelia Eng, PA-C  metFORMIN (GLUCOPHAGE XR) 750 MG 24 hr tablet Take 1 tablet (750 mg total) by mouth daily with breakfast. Patient not taking: Reported on 09/06/2020 08/27/20 08/27/21  [name], PA-C    Inpatient Medications: Scheduled Meds:  [START ON 09/07/2020] insulin aspart  0-9 Units Subcutaneous TID WC   Continuous Infusions:  heparin 950 Units/hr (09/06/20 1456)   nitroGLYCERIN Stopped (09/06/20 1437)   PRN Meds:   Allergies:    Allergies  Allergen Reactions   Baclofen Swelling   Crestor [Rosuvastatin]     Extreme myalgias   Lipitor [Atorvastatin]     Extreme myalgias   Verapamil Swelling   Silicone Itching and Rash    Social History:   Social History   Socioeconomic History   Marital status: Married    Spouse name: Not on file   Number of children: Not on file   Years of education: Not on file   Highest education level: Not on file  Occupational History   Not on file  Tobacco Use   Smoking status: Former   Smokeless tobacco: Never  Substance and Sexual Activity   Alcohol use: Yes    Alcohol/week: 2.0 standard drinks    Types: 2 Standard drinks or equivalent per week    Comment: mix drinks occ.   Drug use: Never   Sexual activity: Not on file  Other Topics Concern   Not on file  Social History Narrative   From [place], married, Sales promotion account executive Witness, new from [place] to [place]  07/2019   Social Determinants of Health   Financial Resource  Strain: Not on file  Food Insecurity: Not on file  Transportation Needs: Not on file  Physical Activity: Not on file  Stress: Not on file  Social Connections: Not on file  Intimate Partner Violence: Not on file    Family History:    Family History  Problem Relation Age of Onset   CAD Mother    Thyroid disease Mother    CAD Father    Hypothyroidism Daughter      Review of Systems: [y] = yes, [ ]  = no    General: Weight gain [ ] ; Weight loss [ ] ; Anorexia [ ] ; Fatigue [ ] ;  Fever [ ] ; Chills [ ] ; Weakness [ ]   Cardiac: Chest pain/pressure [ y]; Resting SOB [ ] ; Exertional SOB [ ] ; Orthopnea [ ] ; Pedal Edema [ ] ; Palpitations Blue.Reese ]; Syncope [ ] ; Presyncope [ ] ; Paroxysmal nocturnal dyspnea[ ]   Pulmonary: Cough [ ] ; Wheezing[ ] ; Hemoptysis[ ] ; Sputum [ ] ; Snoring [ ]   GI: Vomiting[ ] ; Dysphagia[ ] ; Melena[ ] ; Hematochezia [ ] ; Heartburn[ ] ; Abdominal pain [ ] ; Constipation [ ] ; Diarrhea [ ] ; BRBPR [ ]   GU: Hematuria[ ] ; Dysuria [ ] ; Nocturia[ ]   Vascular: Pain in legs with walking [ ] ; Pain in feet with lying flat [ ] ; Non-healing sores [ ] ; Stroke [ ] ; TIA [ ] ; Slurred speech [ ] ;  Neuro: Headaches[ ] ; Vertigo[ ] ; Seizures[ ] ; Paresthesias[ ] ;Blurred vision [ ] ; Diplopia [ ] ; Vision changes [ ]   Ortho/Skin: Arthritis [ ] ; Joint pain [ ] ; Muscle pain [ ] ; Joint swelling [ ] ; Back Pain [ ] ; Rash [ ]   Psych: Depression[ ] ; Anxiety[ ]   Heme: Bleeding problems [ ] ; Clotting disorders [ ] ; Anemia [ ]   Endocrine: Diabetes [ ] ; Thyroid dysfunction[ ]   Physical Exam/Data:   Vitals:   09/06/20 1530 09/06/20 1600 09/06/20 1745 09/06/20 1745  BP: 123/69 134/64  (!) 132/94  Pulse: 74 78  81  Resp: 19 16  19   Temp:    98.9 F (37.2 C)  TempSrc:    Oral  SpO2: 98% 97%  98%  Weight:   99.7 kg   Height:   5' 1"  (1.549 m)    No intake or output data in the 24 hours ending 09/06/20 1823 Filed Weights   09/06/20 1225 09/06/20 1745  Weight: 99.8 kg 99.7 kg   Body mass index is 41.53 kg/m.   General:  Well nourished, well developed, in no acute distress HEENT: normal Lymph: no adenopathy Neck: no JVD Endocrine:  No thryomegaly Vascular: No carotid bruits; FA pulses 2+ bilaterally without bruits  Cardiac:  normal S1, S2; RRR; no murmur  Lungs:  clear to auscultation bilaterally, no wheezing, rhonchi or rales  Abd: soft, nontender, no hepatomegaly  Ext: no edema Musculoskeletal:  No deformities, BUE and BLE strength normal and equal Skin: warm and dry  Neuro:  CNs 2-12 intact, no focal abnormalities noted Psych:  Normal affect   EKG:  The EKG was personally reviewed and demonstrates:  sinus rhythm Telemetry:  Telemetry was personally reviewed and demonstrates:  NSR  Relevant CV Studies: Recent echo normal and stress test normal  Laboratory Data:  Chemistry Recent Labs  Lab 09/06/20 1249  NA 134*  K 4.2  CL 101  CO2 25  GLUCOSE 68*  BUN 21  CREATININE 0.66  CALCIUM 9.6  GFRNONAA >60  ANIONGAP 8    No results for input(s): PROT, ALBUMIN, AST, ALT, ALKPHOS, BILITOT in the last 168 hours. Hematology Recent Labs  Lab 09/06/20 1249  WBC 9.3  RBC 3.83*  HGB 10.8*  HCT 33.5*  MCV 87.5  MCH 28.2  MCHC 32.2  RDW 13.8  PLT 352   Cardiac EnzymesNo results for input(s): TROPONINI in the last 168 hours. No results for input(s): TROPIPOC in the last 168 hours.  BNPNo results for input(s): BNP, PROBNP in the last 168 hours.  DDimer No results for input(s): DDIMER in the last 168 hours.  Radiology/Studies:  CT Angio Chest PE W/Cm &/Or Wo Cm  Result Date: 09/06/2020 CLINICAL DATA:  Intermittent chest pain since last night radiating to the back  EXAM: CT ANGIOGRAPHY CHEST WITH CONTRAST TECHNIQUE: Multidetector CT imaging of the chest was performed using the standard protocol during bolus administration of intravenous contrast. Multiplanar CT image reconstructions and MIPs were obtained to evaluate the vascular anatomy. CONTRAST:  149m OMNIPAQUE IOHEXOL 350 MG/ML  SOLN COMPARISON:  12/13/2019 FINDINGS: Cardiovascular: Pulmonary arteries are patent and normal in caliber. No significant filling defect or pulmonary embolus by CTA. Aortic atherosclerosis. Negative for aneurysm or dissection. Patent 3 vessel arch anatomy with tortuosity noted. No mediastinal hemorrhage or hematoma. Normal heart size. Aortic valve calcifications and coronary atherosclerosis present. No pericardial effusion. Central venous structures appear patent. No veno-occlusive process. Mediastinum/Nodes: No enlarged mediastinal, hilar, or axillary lymph nodes. Thyroid gland, trachea, and esophagus demonstrate no significant findings. Lungs/Pleura: Minor dependent basilar atelectasis. No acute airspace process, collapse or consolidation. Negative for edema or interstitial process. Trachea and central airways are patent. No pleural abnormality, effusion, or pneumothorax. Upper Abdomen: No acute abnormality. Musculoskeletal: No chest wall abnormality. No acute or significant osseous findings. Minor endplate degenerative changes. No acute osseous finding or fracture. Thoracic spine and sternum intact. Review of the MIP images confirms the above findings. IMPRESSION: Negative for significant acute pulmonary embolus by CTA. No other acute intrathoracic finding. Aortic Atherosclerosis (ICD10-I70.0). Electronically Signed   By: MJerilynn Mages  Shick M.D.   On: 09/06/2020 14:00    Assessment and Plan:   Chest Pain. Her characterization of her chest pain is typical angina, however occurring at rest with no elevation in her troponin and recent cardiac stress test that was normal. Loaded with aspirin and started on heparin infusion given concern for unstable angina. This is reasonable for now, however I feel this is more of a subacute process given her pain has been ongoing for months. We will watch her overnight and recommend getting an echo in the AM. Would consider coronary CTA vs repeat stress testing pending progression of  her pain. Continue ASA 81 mg daily. Continue heparin for now. Nitro PRN.       For questions or updates, please contact CParktonPlease consult [email or web address] for contact info under     Signed, DDoyne Keel MD  09/06/2020 6:23 PM

## 2020-09-06 NOTE — ED Provider Notes (Signed)
[facility] EMERGENCY DEPARTMENT Provider Note   CSN: [id number] Arrival date & time: 09/06/20  1207     History Chief Complaint  Patient presents with   Chest Pain    Sandra Ruiz is a 75 y.o. female with medical history significant for diabetes, A. Fib, clotting disorder on Eliquis who presents for evaluation of chest pain.  Began on Thursday when out for a walk.  Described as chest pressure. Has had previously however prior episode "few and far between" and  only "lasted a few seconds and resolved."  Lasted up until 20 minutes after walking on Thursday.  Has had intermittent pain since.  Last night began having more consistent pain, woke her up from sleep at 5 AM.  Episodes lasting longer. Feels like a dull aching as well as stabbing pain with twisting to the center of her chest into her left breast, also radiated into her left scapula and left axilla.  Pain resolves with rest However is not necessarily exertional in nature. Becomes short of breath and "sticky."  Rates her current pain a 3/10.  No lower extremity edema or pain.  She thinks she has been compliant with all of her Eliquis.  Took her husband's nitro at home which resolved her pain however returned about 15 -20 minutes afterwards. No fever, chills, emesis, abd pain, paresthesias, weakness, swelling to BLE, urinary complaints, cough. No throat pain, hx of esophageal spasms. Denies additional aggravating or alleviating factors.   Eliquis this AM  History obtained from patient and past medical records. No interpretor was used.  HPI  HPI: A 75 year old patient with a history of CVA, treated diabetes, hypertension, hypercholesterolemia and obesity presents for evaluation of chest pain. Initial onset of pain was more than 6 hours ago. The patient's chest pain is described as heaviness/pressure/tightness, is sharp, is worse with exertion and is relieved by nitroglycerin. The patient complains of nausea and reports some  diaphoresis. The patient's chest pain is middle- or left-sided, is not well-localized and does radiate to the arms/jaw/neck. The patient has a family history of coronary artery disease in a first-degree relative with onset less than age 68. The patient has no history of peripheral artery disease and has not smoked in the past 90 days.   Past Medical History:  Diagnosis Date   Chronic pain    Depression    Diabetes mellitus without complication (HCC)    GERD (gastroesophageal reflux disease)    History of TIA (transient ischemic attack)    Hyperlipidemia    Hyperopia of both eyes with astigmatism and presbyopia 12/21/2019   Hypertension    Lyme disease    Paroxysmal atrial fibrillation ([place])    Thyroid disease     Patient Active Problem List   Diagnosis Date Noted   Unstable angina ([place]) 09/06/2020   Patient is Jehovah's Witness 08/27/2020   Need for prophylactic vaccination against Streptococcus pneumoniae (pneumococcus) 08/27/2020   Blood transfusion declined because patient is Jehovah's Witness 08/27/2020   Financial difficulties 08/27/2020   Advanced directives, counseling/discussion 08/26/2020   Bilateral thoracic back pain 06/19/2020   History of Lyme disease 05/01/2020   Screen for colon cancer 05/01/2020   Chronic anemia 05/01/2020   Diplopia 05/01/2020   Chronic bilateral back pain 05/01/2020   Persistent atrial fibrillation ([place]) 02/07/2020   Secondary hypercoagulable state ([place]) 02/07/2020   Screening for colon cancer 02/04/2020   Need for influenza vaccination 11/01/2019   Need for vaccination 11/01/2019   Hypertension associated with diabetes (  [place]) 08/02/2019   Trigger finger of left thumb 08/01/2019   Depression, major, single episode, moderate ([place]) 08/01/2019   Statin intolerance 05/30/2019   Thyroid nodule 05/02/2019   Trigger index finger of right hand 05/02/2019   Hypothyroidism 05/02/2019   Diabetes mellitus ([place]) 01/02/2019   Hyperlipidemia 01/02/2019    High risk medication use 01/02/2019   History of TIA (transient ischemic attack) 01/02/2019   Chronic neck pain 01/02/2019   Essential hypertension, benign 01/02/2019   Fatty liver 01/02/2019   OSA (obstructive sleep apnea) 01/02/2019    Past Surgical History:  Procedure Laterality Date   ATRIAL FIBRILLATION ABLATION     x2 in [place]     implantable loop recorder placement  11/07/2017   MDT LINQ1 implanted in [place] for afib management by Dr Candiss Norse   LUMBAR SPINE SURGERY     TONSILLECTOMY AND ADENOIDECTOMY       OB History   No obstetric history on file.     Family History  Problem Relation Age of Onset   CAD Mother    Thyroid disease Mother    CAD Father    Hypothyroidism Daughter     Social History   Tobacco Use   Smoking status: Former   Smokeless tobacco: Never  Substance Use Topics   Alcohol use: Yes    Alcohol/week: 2.0 standard drinks    Types: 2 Standard drinks or equivalent per week    Comment: mix drinks occ.   Drug use: Never    Home Medications Prior to Admission medications   Medication Sig Start Date End Date Taking? Authorizing Provider  ACCU-CHEK GUIDE test strip TEST 1 - 2 TIMES DAILY 09/04/19   Tysinger, Camelia Eng, PA-C  Accu-Chek Softclix Lancets lancets CHECK BLOOD SUGAR ONE TO TWO TIMES DAILY. 03/03/20   Tysinger, Camelia Eng, PA-C  ALPRAZolam Duanne Moron) 0.25 MG tablet Take 1 tablet (0.25 mg total) by mouth 2 (two) times daily as needed for anxiety. 10/11/19   Tysinger, Camelia Eng, PA-C  apixaban (ELIQUIS) 5 MG TABS tablet Take 1 tablet (5 mg total) by mouth 2 (two) times daily. 08/18/20   Fenton, Clint R, PA  Blood Glucose Monitoring Suppl (ACCU-CHEK GUIDE ME) w/Device KIT Test 1-2 times daily 05/09/19   Tysinger, Camelia Eng, PA-C  carvedilol (COREG) 25 MG tablet Take 1 tablet (25 mg total) by mouth 2 (two) times daily with a meal. 08/27/20   Tysinger, Camelia Eng, PA-C  diltiazem (DILT-XR) 180 MG 24 hr capsule TAKE 1  CAPSULE (180 MG TOTAL) BY MOUTH DAILY. 08/27/20   Tysinger, Camelia Eng, PA-C  DULoxetine (CYMBALTA) 60 MG capsule Take 1 capsule (60 mg total) by mouth 2 (two) times daily. 08/27/20   Tysinger, Camelia Eng, PA-C  ezetimibe (ZETIA) 10 MG tablet Take 1 tablet (10 mg total) by mouth daily. 08/27/20   Tysinger, Camelia Eng, PA-C  gabapentin (NEURONTIN) 800 MG tablet Take 1 tablet (800 mg total) by mouth 3 (three) times daily. 08/27/20   Tysinger, Camelia Eng, PA-C  glyBURIDE (DIABETA) 1.25 MG tablet Take 1 tablet (1.25 mg total) by mouth every morning. 08/27/20   Tysinger, Camelia Eng, PA-C  levothyroxine (SYNTHROID) 75 MCG tablet TAKE 1 TABLET EVERY DAY BEFORE BREAKFAST 08/27/20   Tysinger, Camelia Eng, PA-C  lisinopril (ZESTRIL) 10 MG tablet Take 1 tablet (10 mg total) by mouth daily. 05/27/20   Lelon Perla, MD  metFORMIN (GLUCOPHAGE XR) 750 MG 24 hr tablet  Take 1 tablet (750 mg total) by mouth daily with breakfast. 08/27/20 08/27/21  Tysinger, Camelia Eng, PA-C  methocarbamol (ROBAXIN) 500 MG tablet Take 1 tablet (500 mg total) by mouth 2 (two) times daily. 08/27/20   Tysinger, Camelia Eng, PA-C  omeprazole (PRILOSEC) 20 MG capsule TAKE 1 CAPSULE EVERY DAY 08/27/20   Tysinger, Camelia Eng, PA-C  oxybutynin (DITROPAN-XL) 10 MG 24 hr tablet Take 1 tablet (10 mg total) by mouth 2 (two) times daily. 08/27/20   Tysinger, Camelia Eng, PA-C  pravastatin (PRAVACHOL) 40 MG tablet Take 1 tablet (40 mg total) by mouth every evening. 02/04/20 08/26/20  Tysinger, Camelia Eng, PA-C    Allergies    Baclofen, Crestor [rosuvastatin], Lipitor [atorvastatin], Verapamil, and Silicone  Review of Systems   Review of Systems  Constitutional: Negative.   HENT: Negative.    Respiratory:  Positive for shortness of breath. Negative for apnea, cough, choking, chest tightness, wheezing and stridor.   Cardiovascular:  Positive for chest pain. Negative for palpitations and leg swelling.  Gastrointestinal: Negative.   Genitourinary: Negative.   Musculoskeletal: Negative.    Skin: Negative.   Neurological: Negative.   All other systems reviewed and are negative.  Physical Exam Updated Vital Signs BP (!) 116/59 (BP Location: Left Arm)   Pulse 80 Comment: NSR  Temp 98.5 F (36.9 C) (Oral)   Resp 17   Ht $R'5\' 1"'YY$  (1.549 m)   Wt 99.8 kg   SpO2 96%   BMI 41.57 kg/m   Physical Exam Vitals and nursing note reviewed.  Constitutional:      General: She is not in acute distress.    Appearance: She is well-developed. She is obese. She is not ill-appearing.  HENT:     Head: Atraumatic.  Eyes:     Pupils: Pupils are equal, round, and reactive to light.  Cardiovascular:     Rate and Rhythm: Normal rate.     Pulses:          Radial pulses are 2+ on the right side and 2+ on the left side.       Dorsalis pedis pulses are 2+ on the right side and 2+ on the left side.  Pulmonary:     Effort: Pulmonary effort is normal. No respiratory distress.     Breath sounds: Normal breath sounds.     Comments: Speaks in full sentences without difficulty Abdominal:     General: Bowel sounds are normal. There is no distension.     Palpations: Abdomen is soft.  Musculoskeletal:        General: Normal range of motion.     Cervical back: Normal range of motion.     Right lower leg: No tenderness. No edema.     Left lower leg: No tenderness. No edema.     Comments: Moves all 4 extremities without difficulty.  Compartments soft.  Nontender posterior calves.  Full range of motion bilateral shoulders without pain  Skin:    General: Skin is warm and dry.     Capillary Refill: Capillary refill takes less than 2 seconds.  Neurological:     General: No focal deficit present.     Mental Status: She is alert.  Psychiatric:        Mood and Affect: Mood normal.    ED Results / Procedures / Treatments   Labs (all labs ordered are listed, but only abnormal results are displayed) Labs Reviewed  CBC WITH DIFFERENTIAL/PLATELET - Abnormal; Notable for the following components:  Result Value   RBC 3.83 (*)    Hemoglobin 10.8 (*)    HCT 33.5 (*)    All other components within normal limits  BASIC METABOLIC PANEL - Abnormal; Notable for the following components:   Sodium 134 (*)    Glucose, Bld 68 (*)    All other components within normal limits  RESP PANEL BY RT-PCR (FLU A&B, COVID) ARPGX2  APTT  PROTIME-INR  HEPARIN LEVEL (UNFRACTIONATED)  APTT  TROPONIN I (HIGH SENSITIVITY)  TROPONIN I (HIGH SENSITIVITY)    EKG EKG Interpretation  Date/Time:  Saturday September 06 2020 12:19:31 EDT Ventricular Rate:  86 PR Interval:  156 QRS Duration: 80 QT Interval:  342 QTC Calculation: 409 R Axis:   50 Text Interpretation: Normal sinus rhythm Septal infarct , age undetermined Abnormal ECG No significant change since last tracing Confirmed by Davonna Belling [id number]) on 09/06/2020 12:59:43 PM  Radiology CT Angio Chest PE W/Cm &/Or Wo Cm  Result Date: 09/06/2020 CLINICAL DATA:  Intermittent chest pain since last night radiating to the back EXAM: CT ANGIOGRAPHY CHEST WITH CONTRAST TECHNIQUE: Multidetector CT imaging of the chest was performed using the standard protocol during bolus administration of intravenous contrast. Multiplanar CT image reconstructions and MIPs were obtained to evaluate the vascular anatomy. CONTRAST:  120mL OMNIPAQUE IOHEXOL 350 MG/ML SOLN COMPARISON:  12/13/2019 FINDINGS: Cardiovascular: Pulmonary arteries are patent and normal in caliber. No significant filling defect or pulmonary embolus by CTA. Aortic atherosclerosis. Negative for aneurysm or dissection. Patent 3 vessel arch anatomy with tortuosity noted. No mediastinal hemorrhage or hematoma. Normal heart size. Aortic valve calcifications and coronary atherosclerosis present. No pericardial effusion. Central venous structures appear patent. No veno-occlusive process. Mediastinum/Nodes: No enlarged mediastinal, hilar, or axillary lymph nodes. Thyroid gland, trachea, and esophagus demonstrate no  significant findings. Lungs/Pleura: Minor dependent basilar atelectasis. No acute airspace process, collapse or consolidation. Negative for edema or interstitial process. Trachea and central airways are patent. No pleural abnormality, effusion, or pneumothorax. Upper Abdomen: No acute abnormality. Musculoskeletal: No chest wall abnormality. No acute or significant osseous findings. Minor endplate degenerative changes. No acute osseous finding or fracture. Thoracic spine and sternum intact. Review of the MIP images confirms the above findings. IMPRESSION: Negative for significant acute pulmonary embolus by CTA. No other acute intrathoracic finding. Aortic Atherosclerosis (ICD10-I70.0). Electronically Signed   By: Jerilynn Mages.  Shick M.D.   On: 09/06/2020 14:00    Procedures .Critical Care  Date/Time: 09/06/2020 3:06 PM Performed by: Nettie Elm, PA-C Authorized by: Nettie Elm, PA-C   Critical care provider statement:    Critical care time (minutes):  45   Critical care was necessary to treat or prevent imminent or life-threatening deterioration of the following conditions:  Cardiac failure   Critical care was time spent personally by me on the following activities:  Discussions with consultants, evaluation of patient's response to treatment, examination of patient, ordering and performing treatments and interventions, ordering and review of laboratory studies, ordering and review of radiographic studies, pulse oximetry, re-evaluation of patient's condition, obtaining history from patient or surrogate and review of old charts   Medications Ordered in ED Medications  nitroGLYCERIN 50 mg in dextrose 5 % 250 mL (0.2 mg/mL) infusion (0 mcg/min Intravenous Hold 09/06/20 1437)  heparin ADULT infusion 100 units/mL (25000 units/262mL) (950 Units/hr Intravenous New Bag/Given 09/06/20 1456)  nitroGLYCERIN (NITROGLYN) 2 % ointment 0.5 inch (0.5 inches Topical Given 09/06/20 1307)  aspirin chewable tablet 324 mg  (324 mg Oral Given 09/06/20 1304)  iohexol (OMNIPAQUE) 350 MG/ML injection 100 mL (100 mLs Intravenous Contrast Given 09/06/20 1319)   ED Course  I have reviewed the triage vital signs and the nursing notes.  Pertinent labs & imaging results that were available during my care of the patient were reviewed by me and considered in my medical decision making (see chart for details).  Here for evaluation of chest pain which sounds like is been going on a few days.  Intermittent in nature however more persistent over the last 12 hours, woken patient from sleep today.  Located center of chest, radiates under her left breast and into her left axilla and left scapula.  He is chronically anticoagulated and has been compliant with her medications.  She has no clinical evidence of VTE on exam, does not appear fluid overloaded.  Apparently symptoms resolved after taking her husband's nitro at home.  Her abdomen is soft, nontender.  No back pain, low suspicion for dissection.  No cough, low suspicion for infectious process.  Per chart review patient had clean cath in 2019 at OSH.  Plan on labs, imaging, reassess.  Given concerning story patient given nitro, aspirin  Labs and imaging personally reviewed and interpreted:  CBC without leukocytosis, hemoglobin 10.8 BMP sodium 134 Trop 2 CTA Chest with  EKG without ischemic changes  Patient reassessed.  Patient's pain resolved after nitro paste here in ED  CONSULT with Cardiology Dr. Curt Bears  with rec admit to medicine.   Will start on heparin for presumed unstable angina and Nitro drip for pain control.  CONSULT with Dr. Tamala Julian with [facility] who agrees to accept patient in transfer  The patient appears reasonably stabilized for admission considering the current resources, flow, and capabilities available in the ED at this time, and I doubt any other [facility] requiring further screening and/or treatment in the ED prior to admission.   Patient discussed with attending Dr.  Alvino Chapel who agrees with above treatment, plan disposition    MDM Rules/Calculators/A&P HEAR Score: 7   CHA2DS2-VASc Score: 6                   >>>already anticoagulated     Final Clinical Impression(s) / ED Diagnoses Final diagnoses:  Unstable angina [facility])    Rx / DC Orders ED Discharge Orders     None        Osker Ayoub A, PA-C 09/06/20 1516    Davonna Belling, MD 09/07/20 1518

## 2020-09-06 NOTE — ED Notes (Signed)
Patient transported to CT 

## 2020-09-06 NOTE — ED Notes (Signed)
ED TO INPATIENT HANDOFF REPORT  ED Nurse Name and Phone #:   S Name/Age/Gender Charna Busman 75 y.o. female Room/Bed: MH12/MH12  Code Status   Code Status: Not on file  Home/SNF/Other Home Patient oriented to: self, place, time and situation Is this baseline? Yes   Triage Complete: Triage complete  Chief Complaint Unstable angina ([place]) [I20.0]  Triage Note Pt reports intermittent CP since last pm w/ radiation to back; took husband's NTG x 1 w/ some relief around 11am    Allergies Allergies  Allergen Reactions  . Baclofen Swelling  . Crestor [Rosuvastatin]     Extreme myalgias  . Lipitor [Atorvastatin]     Extreme myalgias  . Verapamil Swelling  . Silicone Itching and Rash    Level of Care/Admitting Diagnosis ED Disposition    ED Disposition  Admit   Condition  --   Comment  Hospital Area: [place] [[id number]]  Level of Care: Telemetry Cardiac [[id number]]  Interfacility transfer: Yes  May place patient in observation at [facility] or [place] if equivalent level of care is available:: No  Covid Evaluation: Confirmed COVID Negative  Diagn[facility]: [id number]  Admitting Physician: Norval Morton [id number]  Attending Physician: Norval Morton [id number]         B Medical/Surgery History Past Medical History:  Diagnosis Date  . Chronic pain   . Depression   . Diabetes mellitus without complication ([place])   . GERD (gastroesophageal reflux disease)   . History of TIA (transient ischemic attack)   . Hyperlipidemia   . Hyperopia of both eyes with astigmatism and presbyopia 12/21/2019  . Hypertension   . Lyme disease   . Paroxysmal atrial fibrillation ([facility])   . Thyroid disease    Past Surgical History:  Procedure Laterality Date  . ATRIAL FIBRILLATION ABLATION     x2 in [place]  . CARDIAC CATHETERIZATION    . CERVICAL SPINE SURGERY    . implantable loop recorder placement  11/07/2017   MDT LINQ1 implanted in [place] for  afib management by Dr Candiss Norse  . LUMBAR SPINE SURGERY    . TONSILLECTOMY AND ADENOIDECTOMY       A IV Location/Drains/Wounds Patient Lines/Drains/Airways Status    Active Line/Drains/Airways    Name Placement date Placement time Site Days   Peripheral IV 12/13/19 Right Antecubital 12/13/19  1140  Antecubital  268   Peripheral IV 09/06/20 20 G Left Forearm 09/06/20  1448  Forearm  less than 1          Intake/Output Last 24 hours No intake or output data in the 24 hours ending 09/06/20 1556  Labs/Imaging Results for orders placed or performed during the hospital encounter of 09/06/20 (from the past 48 hour(s))  CBC with Differential     Status: Abnormal   Collection Time: 09/06/20 12:49 PM  Result Value Ref Range   WBC 9.3 4.0 - 10.5 K/uL   RBC 3.83 (L) 3.87 - 5.11 MIL/uL   Hemoglobin 10.8 (L) 12.0 - 15.0 g/dL   HCT 33.5 (L) 36.0 - 46.0 %   MCV 87.5 80.0 - 100.0 fL   MCH 28.2 26.0 - 34.0 pg   MCHC 32.2 30.0 - 36.0 g/dL   RDW 13.8 11.5 - 15.5 %   Platelets 352 150 - 400 K/uL   nRBC 0.0 0.0 - 0.2 %   Neutrophils Relative % 66 %   Neutro Abs 6.1 1.7 - 7.7 K/uL   Lymphocytes Relative 24 %  Lymphs Abs 2.2 0.7 - 4.0 K/uL   Monocytes Relative 8 %   Monocytes Absolute 0.8 0.1 - 1.0 K/uL   Eosinophils Relative 1 %   Eosinophils Absolute 0.1 0.0 - 0.5 K/uL   Basophils Relative 0 %   Basophils Absolute 0.0 0.0 - 0.1 K/uL   Immature Granulocytes 1 %   Abs Immature Granulocytes 0.06 0.00 - 0.07 K/uL    Comment: Performed at [facility], [address] [place], [place] [id number]  Basic metabolic panel     Status: Abnormal   Collection Time: 09/06/20 12:49 PM  Result Value Ref Range   Sodium 134 (L) 135 - 145 mmol/L   Potassium 4.2 3.5 - 5.1 mmol/L   Chloride 101 98 - 111 mmol/L   CO2 25 22 - 32 mmol/L   Glucose, Bld 68 (L) 70 - 99 mg/dL    Comment: Glucose reference range applies only to samples taken after fasting for at least 8 hours.   BUN 21 8 - 23 mg/dL    Creatinine, Ser 0.66 0.44 - 1.00 mg/dL   Calcium 9.6 8.9 - 10.3 mg/dL   GFR, Estimated >60 >60 mL/min    Comment: (NOTE) Calculated using the CKD-EPI Creatinine Equation (2021)    Anion gap 8 5 - 15    Comment: Performed at [facility], [address] [place], [place] [address]  Troponin I (High Sensitivity)     Status: None   Collection Time: 09/06/20 12:49 PM  Result Value Ref Range   Troponin I (High Sensitivity) 2 <18 ng/L    Comment: (NOTE) Elevated high sensitivity troponin I (hsTnI) values and significant  changes across serial measurements may suggest ACS but many other  chronic and acute conditions are known to elevate hsTnI results.  Refer to the "Links" section for chest pain algorithms and additional  guidance. Performed at [facility], [address] [place], [place] [address]   APTT     Status: None   Collection Time: 09/06/20 12:49 PM  Result Value Ref Range   aPTT 28 24 - 36 seconds    Comment: Performed at [facility], [address] [place], [place] [address]  Protime-INR     Status: None   Collection Time: 09/06/20 12:49 PM  Result Value Ref Range   Prothrombin Time 14.6 11.4 - 15.2 seconds   INR 1.1 0.8 - 1.2    Comment: (NOTE) INR goal varies based on device and disease states. Performed at [facility], [address] [place], [place] [address]   Resp Panel by RT-PCR (Flu A&B, Covid) Nasopharyngeal Swab     Status: None   Collection Time: 09/06/20  1:08 PM   Specimen: Nasopharyngeal Swab; Nasopharyngeal(NP) swabs in vial transport medium  Result Value Ref Range   SARS Coronavirus 2 by RT PCR NEGATIVE NEGATIVE    Comment: (NOTE) SARS-CoV-2 target nucleic acids are NOT DETECTED.  The SARS-CoV-2 RNA is generally detectable in upper respiratory specimens during the acute phase of infection. The lowest concentration of SARS-CoV-2 viral copies this assay can detect is 138 copies/mL. A negative result  does not preclude SARS-Cov-2 infection and should not be used as the sole basis for treatment or other patient management decisions. A negative result may occur with  improper specimen collection/handling, submission of specimen other than nasopharyngeal swab, presence of viral mutation(s) within the areas targeted by this assay, and inadequate number of  viral copies(<138 copies/mL). A negative result must be combined with clinical observations, patient history, and epidemiological information. The expected result is Negative.  Fact Sheet for Patients:  EntrepreneurPulse.com.au  Fact Sheet for Healthcare Providers:  IncredibleEmployment.be  This test is no t yet approved or cleared by the [place] FDA and  has been authorized for detection and/or diagnosis of SARS-CoV-2 by FDA under an Emergency Use Authorization (EUA). This EUA will remain  in effect (meaning this test can be used) for the duration of the COVID-19 declaration under Section 564(b)(1) of the Act, 21 U.S.C.section 360bbb-3(b)(1), unless the authorization is terminated  or revoked sooner.       Influenza A by PCR NEGATIVE NEGATIVE   Influenza B by PCR NEGATIVE NEGATIVE    Comment: (NOTE) The Xpert Xpress SARS-CoV-2/FLU/RSV plus assay is intended as an aid in the diagnosis of influenza from Nasopharyngeal swab specimens and should not be used as a sole basis for treatment. Nasal washings and aspirates are unacceptable for Xpert Xpress SARS-CoV-2/FLU/RSV testing.  Fact Sheet for Patients: EntrepreneurPulse.com.au  Fact Sheet for Healthcare Providers: IncredibleEmployment.be  This test is not yet approved or cleared by the [place] FDA and has been authorized for detection and/or diagnosis of SARS-CoV-2 by FDA under an Emergency Use Authorization (EUA). This EUA will remain in effect (meaning this test can be used) for the duration  of the COVID-19 declaration under Section 564(b)(1) of the Act, 21 U.S.C. section 360bbb-3(b)(1), unless the authorization is terminated or revoked.  Performed at [facility], [address] [place], [place] [address]   Troponin I (High Sensitivity)     Status: None   Collection Time: 09/06/20  2:35 PM  Result Value Ref Range   Troponin I (High Sensitivity) 3 <18 ng/L    Comment: (NOTE) Elevated high sensitivity troponin I (hsTnI) values and significant  changes across serial measurements may suggest ACS but many other  chronic and acute conditions are known to elevate hsTnI results.  Refer to the "Links" section for chest pain algorithms and additional  guidance. Performed at [facility], [address] [place], [place] [address]    CT Angio Chest PE W/Cm &/Or Wo Cm  Result Date: 09/06/2020 CLINICAL DATA:  Intermittent chest pain since last night radiating to the back EXAM: CT ANGIOGRAPHY CHEST WITH CONTRAST TECHNIQUE: Multidetector CT imaging of the chest was performed using the standard protocol during bolus administration of intravenous contrast. Multiplanar CT image reconstructions and MIPs were obtained to evaluate the vascular anatomy. CONTRAST:  153m OMNIPAQUE IOHEXOL 350 MG/ML SOLN COMPARISON:  12/13/2019 FINDINGS: Cardiovascular: Pulmonary arteries are patent and normal in caliber. No significant filling defect or pulmonary embolus by CTA. Aortic atherosclerosis. Negative for aneurysm or dissection. Patent 3 vessel arch anatomy with tortuosity noted. No mediastinal hemorrhage or hematoma. Normal heart size. Aortic valve calcifications and coronary atherosclerosis present. No pericardial effusion. Central venous structures appear patent. No veno-occlusive process. Mediastinum/Nodes: No enlarged mediastinal, hilar, or axillary lymph nodes. Thyroid gland, trachea, and esophagus demonstrate no significant findings. Lungs/Pleura: Minor dependent basilar  atelectasis. No acute airspace process, collapse or consolidation. Negative for edema or interstitial process. Trachea and central airways are patent. No pleural abnormality, effusion, or pneumothorax. Upper Abdomen: No acute abnormality. Musculoskeletal: No chest wall abnormality. No acute or significant osseous findings. Minor endplate degenerative changes. No acute osseous finding or fracture. Thoracic spine and sternum intact. Review of the MIP images confirms the above findings. IMPRESSION: Negative for  significant acute pulmonary embolus by CTA. No other acute intrathoracic finding. Aortic Atherosclerosis (ICD10-I70.0). Electronically Signed   By: Jerilynn Mages.  Shick M.D.   On: 09/06/2020 14:00    Pending Labs Unresulted Labs (From admission, onward)    Start     Ordered   09/07/20 0500  Heparin level (unfractionated)  Daily,   R      09/06/20 1438   09/07/20 0500  CBC  Daily,   R      09/06/20 1438   09/07/20 0500  APTT  Daily,   R      09/06/20 1438   09/06/20 2300  APTT  Once-Timed,   STAT        09/06/20 1438   09/06/20 1436  Heparin level (unfractionated)  Once-Timed,   STAT        09/06/20 1435          Vitals/Pain Today's Vitals   09/06/20 1315 09/06/20 1435 09/06/20 1446 09/06/20 1451  BP: (!) 152/75 (!) 116/59  (!) 116/59  Pulse: 74 80  80  Resp: 17 (!) 23  17  Temp:      TempSrc:      SpO2: 97% 97%  96%  Weight:      Height:      PainSc:   0-No pain 0-No pain    Isolation Precautions Airborne and Contact precautions  Medications Medications  nitroGLYCERIN 50 mg in dextrose 5 % 250 mL (0.2 mg/mL) infusion (0 mcg/min Intravenous Hold 09/06/20 1437)  heparin ADULT infusion 100 units/mL (25000 units/234m) (950 Units/hr Intravenous New Bag/Given 09/06/20 1456)  nitroGLYCERIN (NITROGLYN) 2 % ointment 0.5 inch (0.5 inches Topical Given 09/06/20 1307)  aspirin chewable tablet 324 mg (324 mg Oral Given 09/06/20 1304)  iohexol (OMNIPAQUE) 350 MG/ML injection 100 mL (100 mLs  Intravenous Contrast Given 09/06/20 1319)    Mobility walks High fall risk   Focused Assessments Cardiac Assessment Handoff:    No results found for: CKTOTAL, CKMB, CKMBINDEX, TROPONINI No results found for: DDIMER Does the Patient currently have chest pain? No      R Recommendations: See Admitting Provider Note  Report given to:   Additional Notes:

## 2020-09-06 NOTE — Progress Notes (Signed)
ANTICOAGULATION CONSULT NOTE - Initial Consult  Pharmacy Consult for heparin Indication: chest pain/ACS  Allergies  Allergen Reactions   Baclofen Swelling   Crestor [Rosuvastatin]     Extreme myalgias   Lipitor [Atorvastatin]     Extreme myalgias   Verapamil Swelling   Silicone Itching and Rash    Patient Measurements: Height: '5\' 1"'$  (154.9 cm) Weight: 99.8 kg (220 lb) IBW/kg (Calculated) : 47.8 Heparin Dosing Weight: 71.8 kg  Vital Signs: Temp: 98.5 F (36.9 C) (09/03 1224) Temp Source: Oral (09/03 1224) BP: 152/75 (09/03 1315) Pulse Rate: 74 (09/03 1315)  Labs: Recent Labs    09/06/20 1249  HGB 10.8*  HCT 33.5*  PLT 352  CREATININE 0.66  TROPONINIHS 2    Estimated Creatinine Clearance: 67.8 mL/min (by C-G formula based on SCr of 0.66 mg/dL).   Medical History: Past Medical History:  Diagnosis Date   Chronic pain    Depression    Diabetes mellitus without complication (HCC)    GERD (gastroesophageal reflux disease)    History of TIA (transient ischemic attack)    Hyperlipidemia    Hyperopia of both eyes with astigmatism and presbyopia 12/21/2019   Hypertension    Lyme disease    Paroxysmal atrial fibrillation (HCC)    Thyroid disease     Medications: see MAR  Assessment: 75 yo F with chest pain for few days. Troponin not elevated, EKG without ischemic changes. Cards consulted, heparin consult for ACS indication.   PMHx of Afib/clotting disorder (on PTA eliquis, last dose 9/3 AM, confirmed w/ pts husband) and DM.  Baseline CBC wnl.  Goal of Therapy:  Heparin level 0.3-0.7 units/ml aPTT 66-102 seconds Monitor platelets by anticoagulation protocol: Yes   Plan:  Start heparin infusion at 950 units/hr Continue to monitor H&H and platelets Monitor daily HL, aPTT and CBC F/u 8h aPTT  Joetta Manners, PharmD, [facility] Emergency Medicine Clinical Pharmacist ED RPh Phone: [place]: [phone]

## 2020-09-06 NOTE — ED Triage Notes (Addendum)
Pt reports intermittent CP since last pm w/ radiation to back; took husband's NTG x 1 w/ some relief around 11am

## 2020-09-06 NOTE — H&P (Addendum)
History and Physical    Sandra Ruiz:[id number] DOB: 10-10-1945 DOA: 09/06/2020  PCP: Sandra Hurl, PA-C Consultants:  cardiology: Dr. Stanford Breed, endocrinologist: Dr. Loanne Drilling, ophthalmology: Dr. Trinna Post, pain: Dr. Davy Pique Patient coming from:  [facility].  lives with husband, daughter and son in [place].   Chief Complaint: chest pain   HPI: Sandra Ruiz is a 75 y.o. female with medical history significant of hypertension, atrial fibrillation on eliquis,  hyperlipidemia, type 2 diabetes, chronic anemia, history of TIA, hypothyroid, OSA, small secundum ASD, spinal stenosis and depression presenting to ER with chest pain. She states she was starting to get chest pains yesterday that was sub sternal and felt like someone was stabbing her and pain radiated under her left arm. Pain rated as a 6/10. She had associated shortness of breath and radiation up into her jaw.  Pain would come and go and depended on what she was doing. It would last longer with exertion and ease up if she was resting and sometimes it would just go away. She fell asleep and the chest pain woke her up at 5:30AM. The pain continued into the morning and she took one of her husbands NG under her tongue which seemed to ease her pain for 30 minutes, but then the pain came right back and they went to ER.   Cardiac risk factors include: HTN, T2DM, HLD and family hx of heart disease. Both parents passed away from heart disease. She does not smoke.   She denies any fever, chills, headaches. Does endorse dizziness x 1 week, worse with head movement to the left. Hx of vertigo. . No coughing, palpitations, stomach pain, N/V/D, dysuria, leg swelling.   ED Course: vitals: Afebrile, blood pressure 142/75, heart rate 86, respiratory rate 20, oxygen 97% on room air Pertinent labs: Hemoglobin 10.8 (10.1-10.6), glucose 68, troponin 2--->3, COVID and flu negative, EKG normal sinus rhythm with no ST changes, CTA negative for acute PE.  Given 324 mg  aspirin, nitroglycerin patch then  infusion, and started on a heparin drip.  Cardiology was consulted and we were asked to admit  Review of Systems: As per HPI; otherwise review of systems reviewed and negative.   Ambulatory Status:  Ambulates without assistance    Past Medical History:  Diagnosis Date   Chronic pain    Depression    Diabetes mellitus without complication (HCC)    GERD (gastroesophageal reflux disease)    History of TIA (transient ischemic attack)    Hyperlipidemia    Hyperopia of both eyes with astigmatism and presbyopia 12/21/2019   Hypertension    Lyme disease    Paroxysmal atrial fibrillation (HCC)    Thyroid disease     Past Surgical History:  Procedure Laterality Date   ATRIAL FIBRILLATION ABLATION     x2 in [place]     implantable loop recorder placement  11/07/2017   MDT [id number] implanted in [place] for afib management by Dr Margaretha Glassing SPINE SURGERY     TONSILLECTOMY AND ADENOIDECTOMY      Social History   Socioeconomic History   Marital status: Married    Spouse name: Not on file   Number of children: Not on file   Years of education: Not on file   Highest education level: Not on file  Occupational History   Not on file  Tobacco Use   Smoking status: Former   Smokeless tobacco: Never  Substance  and Sexual Activity   Alcohol use: Yes    Alcohol/week: 2.0 standard drinks    Types: 2 Standard drinks or equivalent per week    Comment: mix drinks occ.   Drug use: Never   Sexual activity: Not on file  Other Topics Concern   Not on file  Social History Narrative   From [place], married, Sales promotion account executive Witness, new from [place] to [place]  07/2019   Social Determinants of Health   Financial Resource Strain: Not on file  Food Insecurity: Not on file  Transportation Needs: Not on file  Physical Activity: Not on file  Stress: Not on file  Social Connections: Not on file   Intimate Partner Violence: Not on file    Allergies  Allergen Reactions   Baclofen Swelling   Crestor [Rosuvastatin]     Extreme myalgias   Lipitor [Atorvastatin]     Extreme myalgias   Verapamil Swelling   Silicone Itching and Rash    Family History  Problem Relation Age of Onset   CAD Mother    Thyroid disease Mother    CAD Father    Hypothyroidism Daughter     Prior to Admission medications   Medication Sig Start Date End Date Taking? Authorizing Provider  ACCU-CHEK GUIDE test strip TEST 1 - 2 TIMES DAILY 09/04/19   Tysinger, Camelia Eng, PA-C  Accu-Chek Softclix Lancets lancets CHECK BLOOD SUGAR ONE TO TWO TIMES DAILY. 03/03/20   Tysinger, Camelia Eng, PA-C  ALPRAZolam Duanne Moron) 0.25 MG tablet Take 1 tablet (0.25 mg total) by mouth 2 (two) times daily as needed for anxiety. 10/11/19   Tysinger, Camelia Eng, PA-C  apixaban (ELIQUIS) 5 MG TABS tablet Take 1 tablet (5 mg total) by mouth 2 (two) times daily. 08/18/20   Fenton, Clint R, PA  Blood Glucose Monitoring Suppl (ACCU-CHEK GUIDE ME) w/Device KIT Test 1-2 times daily 05/09/19   Tysinger, Camelia Eng, PA-C  carvedilol (COREG) 25 MG tablet Take 1 tablet (25 mg total) by mouth 2 (two) times daily with a meal. 08/27/20   Tysinger, Camelia Eng, PA-C  diltiazem (DILT-XR) 180 MG 24 hr capsule TAKE 1 CAPSULE (180 MG TOTAL) BY MOUTH DAILY. 08/27/20   Tysinger, Camelia Eng, PA-C  DULoxetine (CYMBALTA) 60 MG capsule Take 1 capsule (60 mg total) by mouth 2 (two) times daily. 08/27/20   Tysinger, Camelia Eng, PA-C  ezetimibe (ZETIA) 10 MG tablet Take 1 tablet (10 mg total) by mouth daily. 08/27/20   Tysinger, Camelia Eng, PA-C  gabapentin (NEURONTIN) 800 MG tablet Take 1 tablet (800 mg total) by mouth 3 (three) times daily. 08/27/20   Tysinger, Camelia Eng, PA-C  glyBURIDE (DIABETA) 1.25 MG tablet Take 1 tablet (1.25 mg total) by mouth every morning. 08/27/20   Tysinger, Camelia Eng, PA-C  levothyroxine (SYNTHROID) 75 MCG tablet TAKE 1 TABLET EVERY DAY BEFORE BREAKFAST 08/27/20   Tysinger,  Camelia Eng, PA-C  lisinopril (ZESTRIL) 10 MG tablet Take 1 tablet (10 mg total) by mouth daily. 05/27/20   Lelon Perla, MD  metFORMIN (GLUCOPHAGE XR) 750 MG 24 hr tablet Take 1 tablet (750 mg total) by mouth daily with breakfast. 08/27/20 08/27/21  Tysinger, Camelia Eng, PA-C  methocarbamol (ROBAXIN) 500 MG tablet Take 1 tablet (500 mg total) by mouth 2 (two) times daily. 08/27/20   Tysinger, Camelia Eng, PA-C  omeprazole (PRILOSEC) 20 MG capsule TAKE 1 CAPSULE EVERY DAY 08/27/20   Tysinger, Camelia Eng, PA-C  oxybutynin (DITROPAN-XL) 10 MG 24 hr tablet  Take 1 tablet (10 mg total) by mouth 2 (two) times daily. 08/27/20   Tysinger, Camelia Eng, PA-C  pravastatin (PRAVACHOL) 40 MG tablet Take 1 tablet (40 mg total) by mouth every evening. 02/04/20 08/26/20  Sandra Hurl, PA-C    Physical Exam: Vitals:   09/06/20 1530 09/06/20 1600 09/06/20 1745 09/06/20 1745  BP: 123/69 134/64  (!) 132/94  Pulse: 74 78  81  Resp: _0 Temp:    98.9 F (37.2 C)  TempSrc:    Oral  SpO2: 98% 97%  98%  Weight:   99.7 kg   Height:   _1  (1.549 m)      General:  Appears calm and comfortable and is in NAD Eyes:  PERRL, EOMI, normal lids, iris ENT:  grossly normal hearing, lips & tongue, mmm; appropriate dentition Neck:  no LAD, masses or thyromegaly; no carotid bruits Cardiovascular:  RRR, no m/r/g. 1+ pitting edema to ankles bilaterally Respiratory:   CTA bilaterally with no wheezes/rales/rhonchi.  Normal respiratory effort. Abdomen:  soft, TTP in epigastric area, ND, NABS Back:   normal alignment, no CVAT Skin:  no rash or induration seen on limited exam Musculoskeletal:  grossly normal tone BUE/BLE, good ROM, no bony abnormality Lower extremity:  No LE edema.  Limited foot exam with no ulcerations.  2+ DP bilaterally and 1+PT bilaterally  Psychiatric:  grossly normal mood and affect, speech fluent and appropriate, AOx3 Neurologic:  CN 2-12 grossly intact, moves all extremities in coordinated fashion, sensation  intact. Dix halpike negative bilaterally     Radiological Exams on Admission: Independently reviewed - see discussion in A/P where applicable  CT Angio Chest PE W/Cm &/Or Wo Cm  Result Date: 09/06/2020 CLINICAL DATA:  Intermittent chest pain since last night radiating to the back EXAM: CT ANGIOGRAPHY CHEST WITH CONTRAST TECHNIQUE: Multidetector CT imaging of the chest was performed using the standard protocol during bolus administration of intravenous contrast. Multiplanar CT image reconstructions and MIPs were obtained to evaluate the vascular anatomy. CONTRAST:  158m OMNIPAQUE IOHEXOL 350 MG/ML SOLN COMPARISON:  12/13/2019 FINDINGS: Cardiovascular: Pulmonary arteries are patent and normal in caliber. No significant filling defect or pulmonary embolus by CTA. Aortic atherosclerosis. Negative for aneurysm or dissection. Patent 3 vessel arch anatomy with tortuosity noted. No mediastinal hemorrhage or hematoma. Normal heart size. Aortic valve calcifications and coronary atherosclerosis present. No pericardial effusion. Central venous structures appear patent. No veno-occlusive process. Mediastinum/Nodes: No enlarged mediastinal, hilar, or axillary lymph nodes. Thyroid gland, trachea, and esophagus demonstrate no significant findings. Lungs/Pleura: Minor dependent basilar atelectasis. No acute airspace process, collapse or consolidation. Negative for edema or interstitial process. Trachea and central airways are patent. No pleural abnormality, effusion, or pneumothorax. Upper Abdomen: No acute abnormality. Musculoskeletal: No chest wall abnormality. No acute or significant osseous findings. Minor endplate degenerative changes. No acute osseous finding or fracture. Thoracic spine and sternum intact. Review of the MIP images confirms the above findings. IMPRESSION: Negative for significant acute pulmonary embolus by CTA. No other acute intrathoracic finding. Aortic Atherosclerosis (ICD10-I70.0). Electronically  Signed   By: MJerilynn Mages  Shick M.D.   On: 09/06/2020 14:00    EKG: Independently reviewed.  NSR with rate 86; nonspecific ST changes with no evidence of acute ischemia   Labs on Admission: I have personally reviewed the available labs and imaging studies at the time of the admission.  Pertinent labs:  Hemoglobin 10.8 (10.1-10.6),  glucose 68: given a meal  troponin 2--->3,  COVID and  flu negative,  EKG normal sinus rhythm with no ST changes, CTA negative for acute PE.    Assessment/Plan Principal Problem:   Unstable angina [facility]) -75 year old female with complaints of substernal chest pain worse with exertion and at rest with multiple cardiac risk factors including known CAD, hypertension, hyperlipidemia, type 2 diabetes and strong family history of heart disease. -hx of minimal nonobstructive coronary artery disease noted on previous CTA -Trended troponin levels have been within normal limits -Echo in June 2022 showed an EF of 65 to 70% with normal LV function.  Grade 2 diastolic dysfunction. Repeat pending  -Pharmacological stress test in June 2021: normal study  -cardiology consulted, started on heparin and nitroglycerin drip.  -Nitroglycerin drip has not been restarted and will defer to cardiology. Prn SL NG.  -Eliquis held in setting of heparin drip -Received 324 mg of aspirin in ER.  Continue 81 mg daily -Continue beta-blocker -associated epigastic pain on exam and worsening of symptoms with food. Will increase PPI  Active Problems   Essential hypertension, benign Appears well controlled Continue carvedilol, lisinopril, and diltiazem  Paroxysmal atrial fibrillation ([place]) -History of previous ablations x2 in [place] in 2019 and 2020.  in sinus rhythm today. ILR in place.  -CHADS2VASC: 7 -Holding Eliquis while on heparin drip -Telemetry -Continue diltiazem and carvedilol  Diastolic CHF -Most recent echo in June 2022 showed grade 2 diastolic dysfunction normal EF -Appears  euvolemic on exam with no signs of exacerbation -We will monitor daily weights, intake and output -continue beta-blocker and ACE inhibitor    Diabetes mellitus ([place]) -Tightly controlled with A1c in August 2022 at 5.7 -Hold glyburide and metformin while inpatient -Sliding scale insulin and Accu-Cheks per protocol    Chronic anemia -Appears to be at her baseline -Continue to monitor -Iron studies in April 2022 within normal limits including folate and B12    Hyperlipidemia -Continue Pravachol 40 mg and Zetia 10 mg -Recent lipid panel in May 2022. TC: 151, TG: 72, HDL: 50 and LDL: 87    History of TIA (transient ischemic attack) -Continue statin.  Holding eliquis while on heparin drip    OSA (obstructive sleep apnea) -can not tolerate cpap mask, she is trialing an oral mouth piece.     Hypothyroidism -Continue Synthroid 75 mcg daily -Last TSH 01/2020 and wnl   Depression Continue Cymbalta  Jehovah Witness Full code, but no blood products   Spinal stenosis Followed by pain management with injections Continue cymbalta and gabapentin  Dizziness/hx of vertigo Dix halpike negative on exam, but worse with positional changes and head movement -declines prn meclizine -PT ordered -orthostatics -disucussed if not better in 2 weeks would recommend imaging of brain to rule out central etiology.   Body mass index is 41.53 kg/m.   Level of care: Telemetry Cardiac DVT prophylaxis:  heparin gtt Code Status:  Full - confirmed with patient. NO BLOOD PRODUCTS Family Communication: None present Disposition Plan:  The patient is from: home  Anticipated d/c is to: home  Requires inpatient hospitalization and is at significant risk of worsening, requires constant monitoring, IV medication, assessment and MDM with specialists.   Consults called: cardiology   Admission status:  observation   Dragon dictation used in completing this note.   Orma Flaming MD [facility]   How to contact the [facility] Attending or Consulting provider [place] or covering provider during after hours [place], for this patient?  Check the care team in [facility] and look for a)  attending/consulting TR[facility] provider listed and b) the [facility] team listed Log into [email or web address] and use [place]'s universal password to access. If you do not have the password, please contact the hospital operator. Locate the [facility] provider you are looking for under [facility] and page to a number that you can be directly reached. If you still have difficulty reaching the provider, please page the [facility] (Director on Call) for the Hospitalists listed on amion for assistance.   09/06/2020, 5:48 PM

## 2020-09-06 NOTE — ED Notes (Signed)
Care Link at bedside 

## 2020-09-07 ENCOUNTER — Other Ambulatory Visit: Payer: Self-pay | Admitting: Physician Assistant

## 2020-09-07 ENCOUNTER — Observation Stay (HOSPITAL_BASED_OUTPATIENT_CLINIC_OR_DEPARTMENT_OTHER): Payer: Medicare HMO

## 2020-09-07 DIAGNOSIS — I480 Paroxysmal atrial fibrillation: Secondary | ICD-10-CM | POA: Diagnosis not present

## 2020-09-07 DIAGNOSIS — Z20822 Contact with and (suspected) exposure to covid-19: Secondary | ICD-10-CM | POA: Diagnosis not present

## 2020-09-07 DIAGNOSIS — Z8673 Personal history of transient ischemic attack (TIA), and cerebral infarction without residual deficits: Secondary | ICD-10-CM | POA: Diagnosis not present

## 2020-09-07 DIAGNOSIS — R079 Chest pain, unspecified: Secondary | ICD-10-CM | POA: Diagnosis not present

## 2020-09-07 DIAGNOSIS — E039 Hypothyroidism, unspecified: Secondary | ICD-10-CM | POA: Diagnosis not present

## 2020-09-07 DIAGNOSIS — E119 Type 2 diabetes mellitus without complications: Secondary | ICD-10-CM | POA: Diagnosis not present

## 2020-09-07 DIAGNOSIS — Z87891 Personal history of nicotine dependence: Secondary | ICD-10-CM | POA: Diagnosis not present

## 2020-09-07 DIAGNOSIS — I200 Unstable angina: Secondary | ICD-10-CM | POA: Diagnosis not present

## 2020-09-07 DIAGNOSIS — I5032 Chronic diastolic (congestive) heart failure: Secondary | ICD-10-CM | POA: Diagnosis not present

## 2020-09-07 DIAGNOSIS — R072 Precordial pain: Secondary | ICD-10-CM

## 2020-09-07 DIAGNOSIS — I110 Hypertensive heart disease with heart failure: Secondary | ICD-10-CM | POA: Diagnosis not present

## 2020-09-07 LAB — GLUCOSE, CAPILLARY
Glucose-Capillary: 121 mg/dL — ABNORMAL HIGH (ref 70–99)
Glucose-Capillary: 144 mg/dL — ABNORMAL HIGH (ref 70–99)

## 2020-09-07 LAB — ECHOCARDIOGRAM COMPLETE
AR max vel: 2.05 cm2
AV Area VTI: 1.8 cm2
AV Area mean vel: 1.93 cm2
AV Mean grad: 7 mmHg
AV Peak grad: 12.7 mmHg
Ao pk vel: 1.78 m/s
Area-P 1/2: 2.52 cm2
Calc EF: 51.3 %
Height: 61 in
S' Lateral: 1.7 cm
Single Plane A2C EF: 56.6 %
Single Plane A4C EF: 52.1 %
Weight: 3454.4 oz

## 2020-09-07 LAB — CBC
HCT: 32 % — ABNORMAL LOW (ref 36.0–46.0)
Hemoglobin: 10.2 g/dL — ABNORMAL LOW (ref 12.0–15.0)
MCH: 28.3 pg (ref 26.0–34.0)
MCHC: 31.9 g/dL (ref 30.0–36.0)
MCV: 88.9 fL (ref 80.0–100.0)
Platelets: 286 10*3/uL (ref 150–400)
RBC: 3.6 MIL/uL — ABNORMAL LOW (ref 3.87–5.11)
RDW: 13.8 % (ref 11.5–15.5)
WBC: 7.7 10*3/uL (ref 4.0–10.5)
nRBC: 0 % (ref 0.0–0.2)

## 2020-09-07 LAB — HEPARIN LEVEL (UNFRACTIONATED): Heparin Unfractionated: >1.1 IU/mL — ABNORMAL HIGH (ref 0.30–0.70)

## 2020-09-07 LAB — APTT: aPTT: 53 seconds — ABNORMAL HIGH (ref 24–36)

## 2020-09-07 MED ORDER — ASPIRIN 81 MG PO CHEW: 81.0000 mg | CHEWABLE_TABLET | Freq: Every day | ORAL | 0 refills | Status: DC

## 2020-09-07 MED ORDER — NITROGLYCERIN 0.4 MG SL SUBL: 0.4000 mg | SUBLINGUAL_TABLET | SUBLINGUAL | 0 refills | Status: DC | PRN

## 2020-09-07 NOTE — Progress Notes (Signed)
*  PRELIMINARY RESULTS* Echocardiogram 2D Echocardiogram has been performed.  Luisa Hart RDCS 09/07/2020, 1:39 PM

## 2020-09-07 NOTE — TOC Transition Note (Signed)
Transition of Care [facility]) - CM/SW Discharge Note   Patient Details  Name: Sandra Ruiz MRN: [id number] Date of Birth: 1945/05/04  [facility] CM/SW Contact:  Maebelle Munroe, RN Phone Number: 09/07/2020, 4:08 PM   Clinical Narrative:  [facility] team for discharge planning. Received request via secured chat for appointment arrangement to outpatient rehab. Call to patient. Inquired if ahe used a facility in the past. Patient shared she just relocated here from [place] a year ago. She currently lives in [place] and want somewhere close to home. She confirms she will have transportation to and from therapy via her husband.  Provided resource- [facility] at [place],  [place]  [address] Phone- [phone] to nurse- Nicholes Rough to include in patient's discharge instructions. Advised patient to call on Tuesday to set up an appointment. She voices understanding. No further needs noted    Final next level of care: OP Rehab Barriers to Discharge: No Barriers Identified   Patient Goals and CMS Choice Patient states their goals for this hospitalization and ongoing recovery are:: Return home to spouse.   Choice offered to / list presented to : Patient  Discharge Placement                       Discharge Plan and Services In-house Referral: Clinical Social Work                                   Social Determinants of Health (SDOH) Interventions     Readmission Risk Interventions No flowsheet data found.

## 2020-09-07 NOTE — Progress Notes (Addendum)
ANTICOAGULATION CONSULT NOTE - Follow Up Consult  Pharmacy Consult for heparin Indication:  PAF and r/o ACS  Allergies  Allergen Reactions   Baclofen Swelling   Crestor [Rosuvastatin]     Extreme myalgias   Verapamil Swelling   Silicone Itching and Rash    Patient Measurements: Height: '5\' 1"'$  (154.9 cm) Weight: 97.9 kg (215 lb 14.4 oz) IBW/kg (Calculated) : 47.8 Heparin Dosing Weight: 71.7 kg  Vital Signs: Temp: 98 F (36.7 C) (09/04 0459) Temp Source: Oral (09/04 0459) BP: 155/74 (09/04 0459) Pulse Rate: 85 (09/04 0459)  Labs: Recent Labs    09/06/20 1249 09/06/20 1435 09/06/20 1512 09/06/20 2243 09/07/20 0705  HGB 10.8*  --   --   --  10.2*  HCT 33.5*  --   --   --  32.0*  PLT 352  --   --   --  286  APTT 28  --   --  35 53*  LABPROT 14.6  --   --   --   --   INR 1.1  --   --   --   --   HEPARINUNFRC  --   --  >1.10*  --  >1.10*  CREATININE 0.66  --   --   --   --   TROPONINIHS 2 3  --   --   --     Estimated Creatinine Clearance: 67 mL/min (by C-G formula based on SCr of 0.66 mg/dL).   Medications:  Infusions:   sodium chloride     heparin 1,250 Units/hr (09/07/20 0014)    Assessment: 75yo female presented with chest pain, unremarkable EKG, minimal CAD and serial troponins negative despite prolonged chest pain. Patient has history of AF on PTA eliquis (last dose 9/3 0900). Pharmacy consulted for heparin dosing.  aPTT is still subtherapeutic at 56 after increasing rate to 1250 units/hr while Eliquis on hold; no infusion issues or signs of bleeding per RN.  Goal of Therapy:  aPTT 66-102 seconds Monitor platelets by anticoagulation protocol: Yes   Plan:  Increase heparin infusion to 1400 units/hr 6 hour aPTT  Daily heparin level and aPTT until correlation  Daily CBC Monitor for signs/symptoms of bleeding F/u further workup and transition to eliquis  Laurey Arrow, PharmD PGY1 Pharmacy Resident 09/07/2020  9:15 AM  Please check [email or web address] for  unit-specific pharmacy phone numbers.

## 2020-09-07 NOTE — Evaluation (Signed)
Physical Therapy Evaluation Patient Details Name: Sandra Ruiz MRN: [id number] DOB: 1945/06/05 Today's Date: 09/07/2020   History of Present Illness  Pt is a 75 y.o. female who presented 09/06/20 with sub sternal chest pain that radiated under her L arm and into her jaw along with SOB. She took one of her husbands sublingual nitroglycerin which relieved some of her pain. Pt started on heparin drip. Pt admitted with angina that occurs at rest with no noted elevation of her troponin and recent cardiac stress test being normal. PMH: hypertension, atrial fibrillation on eliquis,  hyperlipidemia, type 2 diabetes, chronic anemia, history of TIA, hypothyroid, OSA, small secundum ASD, spinal stenosis and depression   Clinical Impression  Pt presents with condition above and deficits mentioned below, see PT Problem List. PTA, she was living with her husband and daughter on the main level of a 2-level house with a level entry. 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 Currently, pt displays generalized weakness, balance deficits, and decreased activity tolerance that place her at risk for falls. In addition, pt reports bouts of dizziness described as the room spinning. See General Comments below in regards to findings with Marye Round testing and orthostatic BP measurements. Coordinated with PT team to try get a vestibular eval for the pt. Recommending follow-up with Vestibular Outpatient PT, and if not Outpatient then HHPT. Will continue to follow acutely.    Follow Up Recommendations Outpatient PT;Supervision for mobility/OOB (vestibular and for strength/balance; HHPT if unable to do OP PT)    Equipment Recommendations  Other (comment) (shower chair)    Recommendations for Other Services       Precautions / Restrictions Precautions Precautions: Other (comment) Precaution Comments: monitor angina Restrictions Weight Bearing Restrictions: No      Mobility  Bed Mobility Overal bed  mobility: Modified Independent             General bed mobility comments: Pt able to transition supine <> sit EOB with rails and HOB elevated without assistance.    Transfers Overall transfer level: Needs assistance Equipment used: Rolling walker (2 wheeled);1 person hand held assist Transfers: Sit to/from Stand Sit to Stand: Min guard         General transfer comment: Min guard assist and extra time to power up to stand from EOB or commode with RW or 1 UE support.  Ambulation/Gait Ambulation/Gait assistance: Min guard Gait Distance (Feet): 125 Feet (x3 bouts of ~125 ft > ~15 ft > ~15 ft) Assistive device: Rolling walker (2 wheeled);1 person hand held assist;None Gait Pattern/deviations: Step-through pattern;Decreased stride length;Shuffle;Wide base of support Gait velocity: reduced Gait velocity interpretation: 1.31 - 2.62 ft/sec, indicative of limited community ambulator General Gait Details: Pt ambulates at slow pace with poor feet clearance and intermittent slight knee buckling but no LOB. She fatigues quickly and keeps a wide BOS. When not using RW she tends to hold onto PT with 1 UE lightly or on tables for support, but can take several steps without UE support without LOB.  Stairs            Wheelchair Mobility    Modified Rankin (Stroke Patients Only)       Balance Overall balance assessment: Needs assistance Sitting-balance support: No upper extremity supported;Feet supported Sitting balance-Leahy Scale: Normal     Standing balance support: Single extremity supported;Bilateral upper extremity supported;No upper extremity supported;During functional activity Standing balance-Leahy Scale: Fair Standing balance comment: Able to take several steps without UE support but  benefits from at least 1 UE support for safe mobility.                             Pertinent Vitals/Pain Pain Assessment: 0-10 Pain Score: 5  Pain Location: low back chronic  pain Pain Descriptors / Indicators: Constant Pain Intervention(s): Limited activity within patient's tolerance;Monitored during session;Repositioned    Home Living Family/patient expects to be discharged to:: Private residence Living Arrangements: Spouse/significant other;Children Available Help at Discharge: Family;Available 24 hours/day Type of Home: House Home Access: Level entry     Home Layout: Two level;Able to live on main level with bedroom/bathroom (laundry room upstairs) Home Equipment: Walker - 4 wheels;Grab bars - tub/shower;Other (comment) (adjustable bed)      Prior Function Level of Independence: Independent         Comments: Pt does not use the rollator but rather funriture surfs or holds onto husband. Pt can drive but has not driven for ~3 years. Has reacher for dressing mod I. Mnages own meds and finances.     Hand Dominance   Dominant Hand: Right    Extremity/Trunk Assessment   Upper Extremity Assessment Upper Extremity Assessment: Generalized weakness (MMT scores of 3+ to 4 grossly; denied numbness/tingling)    Lower Extremity Assessment Lower Extremity Assessment: Generalized weakness (MMT scores of 3+ to 4 grossly; hx of peripheral neuropathy; gross incoordination)    Cervical / Trunk Assessment Cervical / Trunk Assessment: Normal  Communication   Communication: No difficulties  Cognition Arousal/Alertness: Awake/alert Behavior During Therapy: WFL for tasks assessed/performed Overall Cognitive Status: Within Functional Limits for tasks assessed                                 General Comments: A&Ox4.      General Comments General comments (skin integrity, edema, etc.): Reports dizziness, like room spinning, that started x2 weeks PTA in which she cannot recall a fall or anything preceeding it. Reports x1 fall rolling out of bed while sleeping last week. Denies other falls in last 6 months. BP sitting 151/78, BP standing 134/79, BP  standing 3 minutes 149/83, denied dizziness with these transitions. No nystagmus noted with Marye Round test bil but pt did get dizzy coming up from the [place] position each time and got dizzy when turning her head to the R when walking though. Communicated with PT team to see if one could perform vestibular eval.    Exercises     Assessment/Plan    PT Assessment Patient needs continued PT services  PT Problem List Decreased strength;Decreased activity tolerance;Decreased range of motion;Decreased balance;Decreased mobility;Decreased coordination;Decreased knowledge of use of DME;Decreased safety awareness;Impaired sensation;Obesity       PT Treatment Interventions DME instruction;Gait training;Stair training;Functional mobility training;Therapeutic activities;Therapeutic exercise;Balance training;Neuromuscular re-education;Patient/family education    PT Goals (Current goals can be found in the Care Plan section)  Acute Rehab PT Goals Patient Stated Goal: to go home today PT Goal Formulation: With patient Time For Goal Achievement: 09/14/20 Potential to Achieve Goals: Good    Frequency Min 3X/week   Barriers to discharge        Co-evaluation               AM-PAC PT "6 Clicks" Mobility  Outcome Measure Help needed turning from your back to your side while in a flat bed without using bedrails?: None Help needed moving  from lying on your back to sitting on the side of a flat bed without using bedrails?: None Help needed moving to and from a bed to a chair (including a wheelchair)?: A Little Help needed standing up from a chair using your arms (e.g., wheelchair or bedside chair)?: A Little Help needed to walk in hospital room?: A Little Help needed climbing 3-5 steps with a railing? : A Little 6 Click Score: 20    End of Session Equipment Utilized During Treatment: Gait belt Activity Tolerance: Patient tolerated treatment well Patient left: in bed;with call  bell/phone within reach;with bed alarm set   PT Visit Diagnosis: Unsteadiness on feet (R26.81);Other abnormalities of gait and mobility (R26.89);Muscle weakness (generalized) (M62.81);Difficulty in walking, not elsewhere classified (R26.2);Dizziness and giddiness (R42);BPPV BPPV - Right/Left :  (possibly R, unsure)    Time: 1009-1057 PT Time Calculation (min) (ACUTE ONLY): 48 min   Charges:   PT Evaluation $PT Eval Moderate Complexity: 1 Mod PT Treatments $Gait Training: 8-22 mins $Therapeutic Activity: 8-22 mins        Moishe Spice, PT, DPT Acute Rehabilitation Services  Pager: [phone] Office: [place] 09/07/2020, 11:10 AM

## 2020-09-07 NOTE — Progress Notes (Signed)
Physical Therapy Treatment Patient Details Name: JOVIA HENRETTY MRN: [id number] DOB: 11/18/1945 Today's Date: 09/07/2020    History of Present Illness Pt is a 75 y.o. female who presented 09/06/20 with sub sternal chest pain that radiated under her L arm and into her jaw along with SOB. She took one of her husbands sublingual nitroglycerin which relieved some of her pain. Pt started on heparin drip. Pt admitted with angina that occurs at rest with no noted elevation of her troponin and recent cardiac stress test being normal. PMH: hypertension, atrial fibrillation on eliquis,  hyperlipidemia, type 2 diabetes, chronic anemia, history of TIA, hypothyroid, OSA, small secundum ASD, spinal stenosis and depression    PT Comments    Patient screened and positive for Left posterior BPPV. Pt treated with Epley with improved symptoms (see below for details). Agree still needs OPPT for vestibular rehab.    Follow Up Recommendations  Outpatient PT (for vestibular rehab and balance training) Supervision for mobility/OOB     Equipment Recommendations       Recommendations for Other Services       Precautions / Restrictions  Vestibular Assessment        09/07/20 1431  Symptom Behavior  Subjective history of current problem reports long history of vertigo. Describes having vestibular rehab when she lived in [place]. Currently spinning is transient and has been worse over the past two weeks  Type of Dizziness  Spinning  Frequency of Dizziness daily  Duration of Dizziness seconds  Symptom Nature Motion provoked  Aggravating Factors Supine to sit;Sit to stand;Turning head sideways  Relieving Factors Slow movements  Progression of Symptoms No change since onset  History of similar episodes yes  Oculomotor Exam  Oculomotor Alignment Normal  Positional Testing  Dix-Hallpike Dix-Hallpike Left  Horizontal Canal Testing Horizontal Canal Right;Horizontal Canal Left  Dix-Hallpike Left  Dix-Hallpike Left  Duration 10 seconds  Dix-Hallpike Left Symptoms No nystagmus (+spinning sensation)  Horizontal Canal Right  Horizontal Canal Right Duration 0  Horizontal Canal Right Symptoms Normal  Horizontal Canal Left  Horizontal Canal Left Duration 0  Horizontal Canal Left Symptoms Normal                                               09/07/20 0001  Vestibular Treatment/Exercise  Vestibular Treatment Provided Canalith Repositioning  Canalith Repositioning Epley Manuever Left   EPLEY MANUEVER LEFT  Number of Reps  1  Overall Response  Improved Symptoms   RESPONSE DETAILS LEFT +symptoms with each step of Epley; during ambulation afterward, denied vertigo upon standing or with right head turn    Mobility  Bed Mobility Overal bed mobility: Modified Independent                  Transfers Overall transfer level: Needs assistance Equipment used: 1 person hand held assist Transfers: Sit to/from Stand Sit to Stand: Min guard         General transfer comment: due to recent h/o vertigo; for safety--denied vertigo after Epley  Ambulation/Gait Ambulation/Gait assistance: Min guard Gait Distance (Feet): 180 Feet Assistive device: 1 person hand held assist Gait Pattern/deviations: Step-through pattern;Wide base of support     General Gait Details: slower pace with poor foot clearance (appears guarded); she reports she feels more steady after Epley maneuver; one episode of small stagger step which pt caught herself by stopping,  regrouping, and then proceeding   Stairs             Wheelchair Mobility    Modified Rankin (Stroke Patients Only)       Balance           Standing balance support: Single extremity supported Standing balance-Leahy Scale: Poor                              Cognition Arousal/Alertness: Awake/alert Behavior During Therapy: WFL for tasks assessed/performed Overall Cognitive Status: Within Functional Limits for tasks assessed                                         Exercises      General Comments        Pertinent Vitals/Pain      Home Living                      Prior Function            PT Goals (current goals can now be found in the care plan section) Acute Rehab PT Goals Patient Stated Goal: to go home today Time For Goal Achievement: 09/14/20 Potential to Achieve Goals: Good Progress towards PT goals: Progressing toward goals    Frequency           PT Plan Current plan remains appropriate    Co-evaluation              AM-PAC PT "6 Clicks" Mobility   Outcome Measure  Help needed turning from your back to your side while in a flat bed without using bedrails?: None Help needed moving from lying on your back to sitting on the side of a flat bed without using bedrails?: None Help needed moving to and from a bed to a chair (including a wheelchair)?: A Little Help needed standing up from a chair using your arms (e.g., wheelchair or bedside chair)?: A Little Help needed to walk in hospital room?: A Little Help needed climbing 3-5 steps with a railing? : A Little 6 Click Score: 20    End of Session Equipment Utilized During Treatment: Gait belt Activity Tolerance: Patient tolerated treatment well Patient left: in bed;with call bell/phone within reach;with nursing/sitter in room;with family/visitor present (sitting EOB wiht RN) Nurse Communication: Mobility status;Other (comment) (needs OPPT) PT Visit Diagnosis: BPPV BPPV - Right/Left : Left     Time: [id number] PT Time Calculation (min) (ACUTE ONLY): 36 min  Charges:  $Therapeutic Activity: 8-22 mins $Canalith Rep Proc: 8-22 mins                      Arby Barrette, PT Pager [phone]     Rexanne Mano 09/07/2020, 2:45 PM

## 2020-09-07 NOTE — Progress Notes (Addendum)
ANTICOAGULATION CONSULT NOTE - Follow Up Consult  Pharmacy Consult for heparin Indication:  PAF and r/o USAP  Labs: Recent Labs    09/06/20 1249 09/06/20 1435 09/06/20 1512 09/06/20 2243  HGB 10.8*  --   --   --   HCT 33.5*  --   --   --   PLT 352  --   --   --   APTT 28  --   --  35  LABPROT 14.6  --   --   --   INR 1.1  --   --   --   HEPARINUNFRC  --   --  >1.10*  --   CREATININE 0.66  --   --   --   TROPONINIHS 2 3  --   --     Assessment: 75yo female subtherapeutic on heparin with initial dosing while Eliquis on hold; no infusion issues or signs of bleeding per RN.  Goal of Therapy:  aPTT 66-102 seconds   Plan:  Will increase heparin infusion by 4 units/kgABW/hr to 1250 units/hr and check PTT in 8 hours.    Wynona Neat, PharmD, BCPS  09/07/2020,12:12 AM

## 2020-09-07 NOTE — Progress Notes (Addendum)
Progress Note  Patient Name: Sandra Ruiz Date of Encounter: 09/07/2020  [facility] Cardiologist: Kirk Ruths, MD   Subjective   Denies any SOB. Last episode of chest pain around 5AM this morning.   Inpatient Medications    Scheduled Meds:  aspirin  81 mg Oral Daily   carvedilol  25 mg Oral BID WC   diltiazem  180 mg Oral Daily   DULoxetine  60 mg Oral BID   ezetimibe  10 mg Oral Daily   insulin aspart  0-9 Units Subcutaneous TID WC   levothyroxine  75 mcg Oral Q0600   lisinopril  10 mg Oral Daily   pantoprazole  80 mg Oral Daily   sodium chloride flush  3 mL Intravenous Q12H   Continuous Infusions:  sodium chloride     heparin 1,250 Units/hr (09/07/20 0014)   PRN Meds: sodium chloride, acetaminophen, ALPRAZolam, nitroGLYCERIN, ondansetron (ZOFRAN) IV, sodium chloride flush   Vital Signs    Vitals:   09/06/20 2014 09/06/20 2356 09/07/20 0459 09/07/20 0500  BP: (!) 131/56 (!) 133/46 (!) 155/74   Pulse: 78 78 85   Resp: '15 16 18   '$ Temp: 98.4 F (36.9 C) 98.1 F (36.7 C) 98 F (36.7 C)   TempSrc: Oral Oral Oral   SpO2: 96% 98% 98%   Weight:    97.9 kg  Height:        Intake/Output Summary (Last 24 hours) at 09/07/2020 0859 Last data filed at 09/07/2020 0600 Gross per 24 hour  Intake 160.2 ml  Output --  Net 160.2 ml   Last 3 Weights 09/07/2020 09/06/2020 09/06/2020  Weight (lbs) 215 lb 14.4 oz 219 lb 12.8 oz 220 lb  Weight (kg) 97.932 kg 99.701 kg 99.791 kg      Telemetry    NSR with a single burst of PACs - Personally Reviewed  ECG    NSR without significant ST-T wave changes - Personally Reviewed  Physical Exam   GEN: No acute distress.   Neck: No JVD Cardiac: RRR, no murmurs, rubs, or gallops.  Respiratory: Clear to auscultation bilaterally. GI: Soft, nontender, non-distended  MS: No edema; No deformity. Neuro:  Nonfocal  Psych: Normal affect   Labs    High Sensitivity Troponin:   Recent Labs  Lab 09/06/20 1249 09/06/20 1435   TROPONINIHS 2 3      Chemistry Recent Labs  Lab 09/06/20 1249  NA 134*  K 4.2  CL 101  CO2 25  GLUCOSE 68*  BUN 21  CREATININE 0.66  CALCIUM 9.6  GFRNONAA >60  ANIONGAP 8     Hematology Recent Labs  Lab 09/06/20 1249 09/07/20 0705  WBC 9.3 7.7  RBC 3.83* 3.60*  HGB 10.8* 10.2*  HCT 33.5* 32.0*  MCV 87.5 88.9  MCH 28.2 28.3  MCHC 32.2 31.9  RDW 13.8 13.8  PLT 352 286    BNPNo results for input(s): BNP, PROBNP in the last 168 hours.   DDimer No results for input(s): DDIMER in the last 168 hours.   Radiology    CT Angio Chest PE W/Cm &/Or Wo Cm  Result Date: 09/06/2020 CLINICAL DATA:  Intermittent chest pain since last night radiating to the back EXAM: CT ANGIOGRAPHY CHEST WITH CONTRAST TECHNIQUE: Multidetector CT imaging of the chest was performed using the standard protocol during bolus administration of intravenous contrast. Multiplanar CT image reconstructions and MIPs were obtained to evaluate the vascular anatomy. CONTRAST:  112m OMNIPAQUE IOHEXOL 350 MG/ML SOLN COMPARISON:  12/13/2019 FINDINGS: Cardiovascular: Pulmonary arteries are patent and normal in caliber. No significant filling defect or pulmonary embolus by CTA. Aortic atherosclerosis. Negative for aneurysm or dissection. Patent 3 vessel arch anatomy with tortuosity noted. No mediastinal hemorrhage or hematoma. Normal heart size. Aortic valve calcifications and coronary atherosclerosis present. No pericardial effusion. Central venous structures appear patent. No veno-occlusive process. Mediastinum/Nodes: No enlarged mediastinal, hilar, or axillary lymph nodes. Thyroid gland, trachea, and esophagus demonstrate no significant findings. Lungs/Pleura: Minor dependent basilar atelectasis. No acute airspace process, collapse or consolidation. Negative for edema or interstitial process. Trachea and central airways are patent. No pleural abnormality, effusion, or pneumothorax. Upper Abdomen: No acute abnormality.  Musculoskeletal: No chest wall abnormality. No acute or significant osseous findings. Minor endplate degenerative changes. No acute osseous finding or fracture. Thoracic spine and sternum intact. Review of the MIP images confirms the above findings. IMPRESSION: Negative for significant acute pulmonary embolus by CTA. No other acute intrathoracic finding. Aortic Atherosclerosis (ICD10-I70.0). Electronically Signed   By: Jerilynn Mages.  Shick M.D.   On: 09/06/2020 14:00    Cardiac Studies   Echo 07/02/2020 1. Left ventricular ejection fraction, by estimation, is 65 to 70%. The  left ventricle has normal function. The left ventricle has no regional  wall motion abnormalities. Left ventricular diastolic parameters are  consistent with Grade II diastolic  dysfunction (pseudonormalization). Elevated left ventricular end-diastolic  pressure.   2. Right ventricular systolic function is normal. The right ventricular  size is normal.   3. The mitral valve is abnormal. Trivial mitral valve regurgitation.   4. The aortic valve is tricuspid. Aortic valve regurgitation is not  visualized. Mild aortic valve sclerosis is present, with no evidence of  aortic valve stenosis.   5. Cannot exclude small PFO by color doppler.   Comparison(s): No significant change from prior study. 06/21/2019: LVEF  65-70%.   Patient Profile     75 y.o. female with PMH of HTN, atrial fib, HLD, DM II, hypothyroidism, and OSA presented with sharp chest pain radiating to the back.   Assessment & Plan    Chest pain  - negative stress test in 2019, 2020 and lastly 2021.   - Coronary CT 12/13/2019 showed coronary calcium score of 112, which place the patient at [place] percentile for age and sex matched control, minimal CAD. PFO vs small secundum ASD present  - Echo 07/02/2020 normal EF, grade 2 DD.  - patient described chronic intermittent chest pain, however worsened with sharp stabbing pain in the substernal area radiating to the back  yesterday. Since transferred to [facility], had 2 short episodes of chest pain early this morning. Longest episode of chest pain yesterday was about 1 hour  - Serial troponin negative despite prolonged chest pain yesterday, negative multiple myoview from 2019-2021 and minimal CAD seen on coronary CT in Dec 2021  - will discuss with MD, patient already ate this morning with full cup of coffee, unable to run myoview again. No CTA coronary capability this weekend. Will consider send the patient home with sublingual nitro with outpatient workup.   HTN  HLD  DM II  Hypothyroidism  Atrial fibrillation s/p ablation x 2 in [place]: has ILR, previous interrogation showed 0% afib burden. On Eliquis at home, held in the hospital with IV heparin.       For questions or updates, please contact [place] Please consult [email or web address] for contact info under        Signed, Almyra Deforest, PA  09/07/2020, 8:59 AM    Personally seen and examined. Agree with above.  Feels well currently.  Troponins are normal.  Coronary CT in December 2021 showed nonobstructive CAD.  EKG unremarkable with no ischemic changes.  I am comfortable with her being discharged at this time.  She is excited about this.  We will set up an outpatient coronary CT scan to make sure that she has not had any aggressive progression of coronary disease.  Continue with goal-directed medical therapy.  Sublingual nitroglycerin as needed is reasonable as well.  Close follow-up in clinic.  Candee Furbish, MD

## 2020-09-07 NOTE — Discharge Summary (Signed)
Discharge Summary  Sandra Ruiz VQM:[id number] DOB: 1945/03/29  PCP: [name], PA-C  Admit date: 09/06/2020 Discharge date: 09/07/2020  Time spent: Less than 30 minutes  Recommendations for Outpatient Follow-up:  Cardiology will set up an outpatient coronary CT scan to make sure that she has not had any aggressive progression of coronary disease.  Continue with goal-directed medical therapy.  Sublingual nitroglycerin as needed Care provider  Discharge Diagnoses:  Active Hospital Problems   Diagnosis Date Noted   Unstable angina ([place]) 09/06/2020   Chronic diastolic CHF (congestive heart failure) ([place]) 09/06/2020   Chronic anemia 05/01/2020   Persistent atrial fibrillation ([place]) 02/07/2020   Depression, major, single episode, moderate ([place]) 08/01/2019   Hypothyroidism 05/02/2019   Diabetes mellitus ([place]) 01/02/2019   Hyperlipidemia 01/02/2019   History of TIA (transient ischemic attack) 01/02/2019   Essential hypertension, benign 01/02/2019   OSA (obstructive sleep apnea) 01/02/2019    Resolved Hospital Problems  No resolved problems to display.    Discharge Condition: Improved  Diet recommendation: Cardiac  Vitals:   09/07/20 1006 09/07/20 1125  BP: (!) 142/74 (!) 133/98  Pulse: 80 78  Resp:  16  Temp:  97.6 F (36.4 C)  SpO2:  97%    History of present illness:  This is a 75 year old female with medical history significant for hypertension atrial fibrillation on Eliquis hyperlipidemia, type 2 diabetes mellitus chronic anemia, history of TIA, hypothyroidism, obstructive sleep apnea, she was admitted for possible chest pain  Hospital Course:  Principal Problem:   Unstable angina ([place]) Active Problems:   Diabetes mellitus ([place])   Hyperlipidemia   History of TIA (transient ischemic attack)   Essential hypertension, benign   OSA (obstructive sleep apnea)   Hypothyroidism   Depression, major, single episode, moderate (HCC)   Persistent atrial  fibrillation (HCC)   Chronic anemia   Chronic diastolic CHF (congestive heart failure) ([place])   This is a 75 year old female with medical history significant for hypertension atrial fibrillation on Eliquis hyperlipidemia, type 2 diabetes mellitus chronic anemia, history of TIA, hypothyroidism, obstructive sleep apnea, she was admitted for possible chest pain Cardiology was consulted that this chest pain is atypical she had a CT coronary in December 12, 2020 that showed calcium score of 112 which the patient's which places the patient at 66 percentile for age and sex which was essentially consistent with nonobstructive coronary artery disease.  Serial troponin was negative and EKG was unremarkable so cardiology has decided to discharge home and they will set up a outpatient coronary CT scan and also for her to continue sublingual nitroglycerin. During her hospital stay she was evaluated by physical therapy and was noted to have some imbalance possibly from vertigo after they recommended outpatient physical therapy which has been ordered  Procedures: 2D echo  Consultations: Cardiology  Discharge Exam: BP (!) 133/98   Pulse 78   Temp 97.6 F (36.4 C)   Resp 16   Ht 5' 1"  (1.549 m)   Wt 97.9 kg   SpO2 97%   BMI 40.79 kg/m   General: No distress pleasant female Cardiovascular: Rate and rhythm no lower extremity edema Respiratory: Clear to auscultation  Discharge Instructions You were cared for by a hospitalist during your hospital stay. If you have any questions about your discharge medications or the care you received while you were in the hospital after you are discharged, you can call the unit and asked to speak with the hospitalist on call if the hospitalist that took  care of you is not available. Once you are discharged, your primary care physician will handle any further medical issues. Please note that NO REFILLS for any discharge medications will be authorized once you are  discharged, as it is imperative that you return to your primary care physician (or establish a relationship with a primary care physician if you do not have one) for your aftercare needs so that they can reassess your need for medications and monitor your lab values.  Discharge Instructions     Call MD for:  severe uncontrolled pain   Complete by: As directed    Diet - low sodium heart healthy   Complete by: As directed    Discharge instructions   Complete by: As directed    Follow-up with cardiology as arranged/scheduled Follow-up with primary care provider TOC to arrange for outpatient physical therapy   Increase activity slowly   Complete by: As directed       Allergies as of 09/07/2020       Reactions   Baclofen Swelling   Crestor [rosuvastatin]    Extreme myalgias   Verapamil Swelling   Silicone Itching, Rash        Medication List     STOP taking these medications    metFORMIN 750 MG 24 hr tablet Commonly known as: Glucophage XR   methocarbamol 500 MG tablet Commonly known as: ROBAXIN       TAKE these medications    Accu-Chek Guide Me w/Device Kit Test 1-2 times daily   Accu-Chek Guide test strip Generic drug: glucose blood TEST 1 - 2 TIMES DAILY   Accu-Chek Softclix Lancets lancets CHECK BLOOD SUGAR ONE TO TWO TIMES DAILY.   ALPRAZolam 0.25 MG tablet Commonly known as: XANAX Take 1 tablet (0.25 mg total) by mouth 2 (two) times daily as needed for anxiety.   apixaban 5 MG Tabs tablet Commonly known as: Eliquis Take 1 tablet (5 mg total) by mouth 2 (two) times daily.   aspirin 81 MG chewable tablet Chew 1 tablet (81 mg total) by mouth daily. Start taking on: September 08, 2020   carvedilol 25 MG tablet Commonly known as: COREG Take 1 tablet (25 mg total) by mouth 2 (two) times daily with a meal.   diltiazem 180 MG 24 hr capsule Commonly known as: Dilt-XR TAKE 1 CAPSULE (180 MG TOTAL) BY MOUTH DAILY.   DULoxetine 60 MG capsule Commonly  known as: CYMBALTA Take 1 capsule (60 mg total) by mouth 2 (two) times daily. What changed: when to take this   ezetimibe 10 MG tablet Commonly known as: ZETIA Take 1 tablet (10 mg total) by mouth daily.   gabapentin 800 MG tablet Commonly known as: NEURONTIN Take 1 tablet (800 mg total) by mouth 3 (three) times daily.   glyBURIDE 1.25 MG tablet Commonly known as: DIABETA Take 1 tablet (1.25 mg total) by mouth every morning.   levothyroxine 75 MCG tablet Commonly known as: SYNTHROID TAKE 1 TABLET EVERY DAY BEFORE BREAKFAST   lisinopril 10 MG tablet Commonly known as: ZESTRIL Take 1 tablet (10 mg total) by mouth daily.   nitroGLYCERIN 0.4 MG SL tablet Commonly known as: NITROSTAT Place 1 tablet (0.4 mg total) under the tongue every 5 (five) minutes as needed for chest pain.   omeprazole 20 MG capsule Commonly known as: PRILOSEC TAKE 1 CAPSULE EVERY DAY   oxybutynin 10 MG 24 hr tablet Commonly known as: DITROPAN-XL Take 1 tablet (10 mg total) by mouth 2 (two) times  daily.   pravastatin 40 MG tablet Commonly known as: PRAVACHOL Take 1 tablet (40 mg total) by mouth every evening.   sitaGLIPtin-metformin 50-1000 MG tablet Commonly known as: JANUMET Take 1 tablet by mouth 2 (two) times daily with a meal.               Durable Medical Equipment  (From admission, onward)           Start     Ordered   09/07/20 1258  DME Shower stool  Once        09/07/20 1306           Allergies  Allergen Reactions   Baclofen Swelling   Crestor [Rosuvastatin]     Extreme myalgias   Verapamil Swelling   Silicone Itching and Rash      The results of significant diagnostics from this hospitalization (including imaging, microbiology, ancillary and laboratory) are listed below for reference.    Significant Diagnostic Studies: CT Angio Chest PE W/Cm &/Or Wo Cm  Result Date: 09/06/2020 CLINICAL DATA:  Intermittent chest pain since last night radiating to the back  EXAM: CT ANGIOGRAPHY CHEST WITH CONTRAST TECHNIQUE: Multidetector CT imaging of the chest was performed using the standard protocol during bolus administration of intravenous contrast. Multiplanar CT image reconstructions and MIPs were obtained to evaluate the vascular anatomy. CONTRAST:  174m OMNIPAQUE IOHEXOL 350 MG/ML SOLN COMPARISON:  12/13/2019 FINDINGS: Cardiovascular: Pulmonary arteries are patent and normal in caliber. No significant filling defect or pulmonary embolus by CTA. Aortic atherosclerosis. Negative for aneurysm or dissection. Patent 3 vessel arch anatomy with tortuosity noted. No mediastinal hemorrhage or hematoma. Normal heart size. Aortic valve calcifications and coronary atherosclerosis present. No pericardial effusion. Central venous structures appear patent. No veno-occlusive process. Mediastinum/Nodes: No enlarged mediastinal, hilar, or axillary lymph nodes. Thyroid gland, trachea, and esophagus demonstrate no significant findings. Lungs/Pleura: Minor dependent basilar atelectasis. No acute airspace process, collapse or consolidation. Negative for edema or interstitial process. Trachea and central airways are patent. No pleural abnormality, effusion, or pneumothorax. Upper Abdomen: No acute abnormality. Musculoskeletal: No chest wall abnormality. No acute or significant osseous findings. Minor endplate degenerative changes. No acute osseous finding or fracture. Thoracic spine and sternum intact. Review of the MIP images confirms the above findings. IMPRESSION: Negative for significant acute pulmonary embolus by CTA. No other acute intrathoracic finding. Aortic Atherosclerosis (ICD10-I70.0). Electronically Signed   By: MJerilynn Mages  Shick M.D.   On: 09/06/2020 14:00   CUP PACEART REMOTE DEVICE CHECK  Result Date: 09/02/2020 ILR summary report received. Battery status OK. Normal device function. No new symptom, tachy, brady, or pause episodes. No new AF episodes. Monthly summary reports and ROV/PRN    Microbiology: Recent Results (from the past 240 hour(s))  Resp Panel by RT-PCR (Flu A&B, Covid) Nasopharyngeal Swab     Status: None   Collection Time: 09/06/20  1:08 PM   Specimen: Nasopharyngeal Swab; Nasopharyngeal(NP) swabs in vial transport medium  Result Value Ref Range Status   SARS Coronavirus 2 by RT PCR NEGATIVE NEGATIVE Final    Comment: (NOTE) SARS-CoV-2 target nucleic acids are NOT DETECTED.  The SARS-CoV-2 RNA is generally detectable in upper respiratory specimens during the acute phase of infection. The lowest concentration of SARS-CoV-2 viral copies this assay can detect is 138 copies/mL. A negative result does not preclude SARS-Cov-2 infection and should not be used as the sole basis for treatment or other patient management decisions. A negative result may occur with  improper specimen collection/handling, submission  of specimen other than nasopharyngeal swab, presence of viral mutation(s) within the areas targeted by this assay, and inadequate number of viral copies(<138 copies/mL). A negative result must be combined with clinical observations, patient history, and epidemiological information. The expected result is Negative.  Fact Sheet for Patients:  EntrepreneurPulse.com.au  Fact Sheet for Healthcare Providers:  IncredibleEmployment.be  This test is no t yet approved or cleared by the [place] FDA and  has been authorized for detection and/or diagnosis of SARS-CoV-2 by FDA under an Emergency Use Authorization (EUA). This EUA will remain  in effect (meaning this test can be used) for the duration of the COVID-19 declaration under Section 564(b)(1) of the Act, 21 U.S.C.section 360bbb-3(b)(1), unless the authorization is terminated  or revoked sooner.       Influenza A by PCR NEGATIVE NEGATIVE Final   Influenza B by PCR NEGATIVE NEGATIVE Final    Comment: (NOTE) The Xpert Xpress SARS-CoV-2/FLU/RSV plus assay is  intended as an aid in the diagnosis of influenza from Nasopharyngeal swab specimens and should not be used as a sole basis for treatment. Nasal washings and aspirates are unacceptable for Xpert Xpress SARS-CoV-2/FLU/RSV testing.  Fact Sheet for Patients: EntrepreneurPulse.com.au  Fact Sheet for Healthcare Providers: IncredibleEmployment.be  This test is not yet approved or cleared by the [place] FDA and has been authorized for detection and/or diagnosis of SARS-CoV-2 by FDA under an Emergency Use Authorization (EUA). This EUA will remain in effect (meaning this test can be used) for the duration of the COVID-19 declaration under Section 564(b)(1) of the Act, 21 U.S.C. section 360bbb-3(b)(1), unless the authorization is terminated or revoked.  Performed at [facility], [address] [place], [place] [address]      Labs: Basic Metabolic Panel: Recent Labs  Lab 09/06/20 1249  NA 134*  K 4.2  CL 101  CO2 25  GLUCOSE 68*  BUN 21  CREATININE 0.66  CALCIUM 9.6   Liver Function Tests: No results for input(s): AST, ALT, ALKPHOS, BILITOT, PROT, ALBUMIN in the last 168 hours. No results for input(s): LIPASE, AMYLASE in the last 168 hours. No results for input(s): AMMONIA in the last 168 hours. CBC: Recent Labs  Lab 09/06/20 1249 09/07/20 0705  WBC 9.3 7.7  NEUTROABS 6.1  --   HGB 10.8* 10.2*  HCT 33.5* 32.0*  MCV 87.5 88.9  PLT 352 286   Cardiac Enzymes: No results for input(s): CKTOTAL, CKMB, CKMBINDEX, TROPONINI in the last 168 hours. BNP: BNP (last 3 results) No results for input(s): BNP in the last 8760 hours.  ProBNP (last 3 results) No results for input(s): PROBNP in the last 8760 hours.  CBG: Recent Labs  Lab 09/06/20 2211 09/07/20 0559 09/07/20 1119  GLUCAP 106* 121* 144*       Signed:  Cristal Deer, MD [facility] 09/07/2020, 1:06 PM

## 2020-09-09 ENCOUNTER — Telehealth: Payer: Self-pay

## 2020-09-09 NOTE — Telephone Encounter (Signed)
Pt. Was recently in the hospital unstable angina and congestive heart failure. I reviewed the hospital notes and did not see if she needed to f/u here or not. If you could review and let me know if f/u is needed or not.

## 2020-09-10 ENCOUNTER — Other Ambulatory Visit: Payer: Self-pay

## 2020-09-10 ENCOUNTER — Telehealth: Payer: Self-pay

## 2020-09-10 DIAGNOSIS — Z789 Other specified health status: Secondary | ICD-10-CM

## 2020-09-10 DIAGNOSIS — Z7901 Long term (current) use of anticoagulants: Secondary | ICD-10-CM | POA: Diagnosis not present

## 2020-09-10 DIAGNOSIS — K5900 Constipation, unspecified: Secondary | ICD-10-CM | POA: Diagnosis not present

## 2020-09-10 DIAGNOSIS — D649 Anemia, unspecified: Secondary | ICD-10-CM | POA: Diagnosis not present

## 2020-09-10 DIAGNOSIS — Z86010 Personal history of colonic polyps: Secondary | ICD-10-CM | POA: Diagnosis not present

## 2020-09-10 DIAGNOSIS — K760 Fatty (change of) liver, not elsewhere classified: Secondary | ICD-10-CM | POA: Diagnosis not present

## 2020-09-10 NOTE — Telephone Encounter (Signed)
Transition Care Management Follow-up Telephone Call Date of discharge and from where: Sandra Ruiz 09/07/20 How have you been since you were released from the hospital? No Any questions or concerns? No  Items Reviewed: Did the pt receive and understand the discharge instructions provided? Yes  Medications obtained and verified? Yes  Other? No  Any new allergies since your discharge? No  Dietary orders reviewed? No Do you have support at home? Yes     Functional Questionnaire: (I = Independent and D = Dependent) ADLs: I  Bathing/Dressing- I  Meal Prep- I  Eating- I  Maintaining continence- I  Transferring/Ambulation- I  Managing Meds- I  Follow up appointments reviewed:  PCP Hospital f/u appt confirmed? Yes  Scheduled to see Dorothea Ogle on 09/18/20 @ 11:30. [place] f/u appt confirmed? No   Are transportation arrangements needed? No  If their condition worsens, is the pt aware to call PCP or go to the Emergency Dept.? Yes Was the patient provided with contact information for the PCP's office or ED? Yes Was to pt encouraged to call back with questions or concerns? Yes

## 2020-09-12 ENCOUNTER — Telehealth (HOSPITAL_COMMUNITY): Payer: Self-pay | Admitting: *Deleted

## 2020-09-12 ENCOUNTER — Other Ambulatory Visit (HOSPITAL_COMMUNITY): Payer: Self-pay | Admitting: *Deleted

## 2020-09-12 MED ORDER — METOPROLOL TARTRATE 100 MG PO TABS: ORAL_TABLET | ORAL | 0 refills | Status: DC

## 2020-09-12 NOTE — Telephone Encounter (Signed)
Reaching out to patient to offer assistance regarding upcoming cardiac imaging study; pt verbalizes understanding of appt date/time, parking situation and where to check in, pre-test NPO status and medications ordered, and verified current allergies; name and call back number provided for further questions should they arise  Sandra Clement RN Navigator Cardiac Imaging [facility] [phone] office [phone] cell  Patient to take her daily medications and '100mg'$  metoprolol tartrate two hours prior to CCTA.  Patient to hold lisinopril for test.

## 2020-09-15 ENCOUNTER — Ambulatory Visit (HOSPITAL_COMMUNITY): Admission: RE | Admit: 2020-09-15 | Payer: Medicare HMO | Source: Ambulatory Visit

## 2020-09-15 DIAGNOSIS — D649 Anemia, unspecified: Secondary | ICD-10-CM | POA: Diagnosis not present

## 2020-09-15 NOTE — Progress Notes (Signed)
[facility] Summary Report / Loop Recorder 

## 2020-09-18 ENCOUNTER — Ambulatory Visit (INDEPENDENT_AMBULATORY_CARE_PROVIDER_SITE_OTHER): Payer: Medicare HMO | Admitting: Medical

## 2020-09-18 ENCOUNTER — Other Ambulatory Visit: Payer: Self-pay

## 2020-09-18 VITALS — BP 110/60 | HR 89 | Resp 16 | Wt 213.6 lb

## 2020-09-18 DIAGNOSIS — E1159 Type 2 diabetes mellitus with other circulatory complications: Secondary | ICD-10-CM | POA: Diagnosis not present

## 2020-09-18 DIAGNOSIS — I4819 Other persistent atrial fibrillation: Secondary | ICD-10-CM | POA: Diagnosis not present

## 2020-09-18 DIAGNOSIS — I5032 Chronic diastolic (congestive) heart failure: Secondary | ICD-10-CM

## 2020-09-18 DIAGNOSIS — Z79899 Other long term (current) drug therapy: Secondary | ICD-10-CM

## 2020-09-18 DIAGNOSIS — I200 Unstable angina: Secondary | ICD-10-CM

## 2020-09-18 DIAGNOSIS — F321 Major depressive disorder, single episode, moderate: Secondary | ICD-10-CM | POA: Diagnosis not present

## 2020-09-18 DIAGNOSIS — E039 Hypothyroidism, unspecified: Secondary | ICD-10-CM

## 2020-09-18 DIAGNOSIS — M542 Cervicalgia: Secondary | ICD-10-CM

## 2020-09-18 DIAGNOSIS — M549 Dorsalgia, unspecified: Secondary | ICD-10-CM

## 2020-09-18 DIAGNOSIS — R6889 Other general symptoms and signs: Secondary | ICD-10-CM

## 2020-09-18 DIAGNOSIS — I10 Essential (primary) hypertension: Secondary | ICD-10-CM

## 2020-09-18 DIAGNOSIS — D649 Anemia, unspecified: Secondary | ICD-10-CM

## 2020-09-18 DIAGNOSIS — I152 Hypertension secondary to endocrine disorders: Secondary | ICD-10-CM

## 2020-09-18 DIAGNOSIS — Z23 Encounter for immunization: Secondary | ICD-10-CM | POA: Diagnosis not present

## 2020-09-18 DIAGNOSIS — G8929 Other chronic pain: Secondary | ICD-10-CM

## 2020-09-18 DIAGNOSIS — E1169 Type 2 diabetes mellitus with other specified complication: Secondary | ICD-10-CM | POA: Diagnosis not present

## 2020-09-18 NOTE — Progress Notes (Signed)
Subjective: Chief Complaint  Patient presents with   Hospitalization Follow-up    Hospital follow-up- chest pain, sob.    Here for hospital f/u  Primary Care Provider Safira Proffit, Camelia Eng, PA-C here for primary care  Current Health Care Team: Dentist, Dr. Tyson Dense doctor, Dr. Velta Addison but can't remember name Dr. Blair Hailey, pain management/neurosurgery Dr. Kirk Ruths, cardiology Dr. Renato Shin, endocrinology Dr. Devonne Doughty, ophthalmology  Hospitalized September 3 through September 07, 2020 at [facility].  Hospitalized for  unstable angina  Diagnoses included: Chronic diastolic CHF Chronic anemia Persistent Afib Depression Hypothyroidism Diabetes Hyperlipidemia Hx/o TIA HTN OSA  She is suppose to have coronary CT as an outpatient.  Hospitalized advised medical therapy and sublingual NTG as needed.   She has hx/o HTN, afib on eliquis, hyperlipidemia, di bates type 2, anemic chronic, hx/o TIA, hypothyroidism, OSA.  Admitted for chest pain.  Serial troponin in hospital were negative, EKG unremarkable.   Cardiology discharged home and has plants for outpatient coronary CT.  She has imbalance issues in hospital, saw PT and they recommended outpatient PT which was reportedly ordered.  Metformin and robaxin were discontinued with this hospitalization.  She is on Janumet metformin combo and glyburide for diabetes.   CT angio chest did not show PE or other acute issues.  Did show aortic atheroscleroses  Currently today she notes every morning when she awakes feels her heart racing briefly.  She still feels little short of breath but no different than when she went to the hospital.  No leg swelling.  She goes to cardiology next week.    She saw [facility] gastroenterology recently due to the longstanding anemia.  They plan to do some endoscopies.  They did some recent blood work as well.  She does feel a lot of anxiety about medical bills.  She is not agreeable to seeing a  counselor currently.  She is not currently seeing psychiatry.  Since her and her husband moved to [place] they feel like the healthcare system here and the way things are billed and charged insurance is way different than what she experienced in [place].  She feels overwhelmed with financial burden right now.   Past Medical History:  Diagnosis Date   Chronic pain    Depression    Diabetes mellitus without complication (HCC)    GERD (gastroesophageal reflux disease)    History of TIA (transient ischemic attack)    Hyperlipidemia    Hyperopia of both eyes with astigmatism and presbyopia 12/21/2019   Hypertension    Lyme disease    Paroxysmal atrial fibrillation (HCC)    Thyroid disease    Current Outpatient Medications on File Prior to Visit  Medication Sig Dispense Refill   ACCU-CHEK GUIDE test strip TEST 1 - 2 TIMES DAILY 200 strip 0   Accu-Chek Softclix Lancets lancets CHECK BLOOD SUGAR ONE TO TWO TIMES DAILY. 200 each 2   ALPRAZolam (XANAX) 0.25 MG tablet Take 1 tablet (0.25 mg total) by mouth 2 (two) times daily as needed for anxiety. 20 tablet 0   apixaban (ELIQUIS) 5 MG TABS tablet Take 1 tablet (5 mg total) by mouth 2 (two) times daily. 56 tablet 0   aspirin 81 MG chewable tablet Chew 1 tablet (81 mg total) by mouth daily. 30 tablet 0   Blood Glucose Monitoring Suppl (ACCU-CHEK GUIDE ME) w/Device KIT Test 1-2 times daily 1 kit 0   carvedilol (COREG) 25 MG tablet Take 1 tablet (25 mg total)  by mouth 2 (two) times daily with a meal. 180 tablet 3   diltiazem (DILT-XR) 180 MG 24 hr capsule TAKE 1 CAPSULE (180 MG TOTAL) BY MOUTH DAILY. 90 capsule 3   DULoxetine (CYMBALTA) 60 MG capsule Take 1 capsule (60 mg total) by mouth 2 (two) times daily. (Patient taking differently: Take 60 mg by mouth daily.) 180 capsule 1   ezetimibe (ZETIA) 10 MG tablet Take 1 tablet (10 mg total) by mouth daily. 90 tablet 3   gabapentin (NEURONTIN) 800 MG tablet Take 1 tablet (800 mg total) by mouth  3 (three) times daily. 270 tablet 1   glyBURIDE (DIABETA) 1.25 MG tablet Take 1 tablet (1.25 mg total) by mouth every morning. 90 tablet 3   levothyroxine (SYNTHROID) 75 MCG tablet TAKE 1 TABLET EVERY DAY BEFORE BREAKFAST 90 tablet 3   lisinopril (ZESTRIL) 10 MG tablet Take 1 tablet (10 mg total) by mouth daily. 90 tablet 3   nitroGLYCERIN (NITROSTAT) 0.4 MG SL tablet Place 1 tablet (0.4 mg total) under the tongue every 5 (five) minutes as needed for chest pain. 30 tablet 0   omeprazole (PRILOSEC) 20 MG capsule TAKE 1 CAPSULE EVERY DAY 90 capsule 3   oxybutynin (DITROPAN-XL) 10 MG 24 hr tablet Take 1 tablet (10 mg total) by mouth 2 (two) times daily. 180 tablet 3   sitaGLIPtin-metformin (JANUMET) 50-1000 MG tablet Take 1 tablet by mouth 2 (two) times daily with a meal.     pravastatin (PRAVACHOL) 40 MG tablet Take 1 tablet (40 mg total) by mouth every evening. 90 tablet 3   No current facility-administered medications on file prior to visit.    ROS as in subjective    Objective: BP 110/60   Pulse 89   Resp 16   Wt 213 lb 9.6 oz (96.9 kg)   SpO2 98%   BMI 40.36 kg/m   Wt Readings from Last 3 Encounters:  09/18/20 213 lb 9.6 oz (96.9 kg)  09/07/20 215 lb 14.4 oz (97.9 kg)  08/26/20 220 lb 3.2 oz (99.9 kg)   Gen: wd, wn,nad Lungs clear Neck supple, nontender, no enlarged nodes, no JVD or bruit Heart rhythm seems normal today, normal RRR, no murmurs, normal S1-S2 No lower extremity edema or other edema Pulses within normal limits    Assessment: Encounter Diagnoses  Name Primary?   Unstable angina (HCC) Yes   Type 2 diabetes mellitus with other specified complication, without long-term current use of insulin (HCC)    Needs flu shot    Persistent atrial fibrillation (HCC)    Hypothyroidism, unspecified type    Hypertension associated with diabetes ([place])    High risk medication use    Essential hypertension, benign    Depression, major, single episode, moderate (HCC)     Chronic neck pain    Chronic diastolic CHF (congestive heart failure) (HCC)    Chronic bilateral back pain, unspecified back location    Chronic anemia    Change in weight      Plan: I reviewed her recent hospitalization notes, discharge summary, imaging, labs.  Reconciled medications. She is taking Janumet and glyburide but we will discontinue glyburide today as she is having some lower sugar readings. Robaxin was stopped due to some ataxia Plain metformin was still listed in the chart but this was discontinued at the hospital  I sent her home with the medication list to verify her pill bottles at home.  I am showing that nothing is without refills however there was  a notation about Pravachol being expired.  She notes that she does still have some Xanax and does not need a refill today although it does not show any recent refills.  She will increase the Cymbalta to twice a day as she is only been doing this once daily to help with mood and chronic pains.  Referral to [facility] in [place] work and home health eval.  She is agreeable to this.  Ataxia, dizziness-I recommend she continue to plan for physical therapy which was ordered to the hospital  Shortness of breath, chest pain -follow-up with cardiology as planned  All other medicines were unchanged  Diabetes-stop glyburide.  Continue Janumet.  Anemia-we will request recent gastroenterology records.  I reviewed her recent labs from the hospital.  Iron a few weeks ago was normal.  This could just represent anemia of chronic disease.  Weight change-advise she check her weights daily.  If she continues to lose or gain more than 3 pounds in any given day or if she notes any significant weight change in the next 2 weeks then call back.   Sandra Ruiz was seen today for hospitalization follow-up.  Diagnoses and all orders for this visit:  Unstable angina ([place])  Type 2 diabetes mellitus with other specified complication,  without long-term current use of insulin (HCC)  Needs flu shot -     Cancel: Flu Vaccine QUAD High Dose(Fluad)  Persistent atrial fibrillation (HCC)  Hypothyroidism, unspecified type  Hypertension associated with diabetes (HCC)  High risk medication use  Essential hypertension, benign  Depression, major, single episode, moderate (HCC)  Chronic neck pain  Chronic diastolic CHF (congestive heart failure) (HCC)  Chronic bilateral back pain, unspecified back location  Chronic anemia  Change in weight  F/u pending [facility] referral, f/u with cardiology, f/u here in 1 mo

## 2020-09-19 ENCOUNTER — Telehealth: Payer: Self-pay | Admitting: Internal Medicine

## 2020-09-19 ENCOUNTER — Telehealth (HOSPITAL_COMMUNITY): Payer: Self-pay | Admitting: Emergency Medicine

## 2020-09-19 NOTE — Telephone Encounter (Signed)
Attempted to call patient regarding upcoming cardiac CT appointment. °Left message on voicemail with name and callback number °Kenlee Maler RN Navigator Cardiac Imaging °[facility] °[phone] Office °[phone] Cell ° °

## 2020-09-19 NOTE — Telephone Encounter (Signed)
Pt left without getting flu shot. Pt states she will come back when her husband has an appt to get this.

## 2020-09-19 NOTE — Addendum Note (Signed)
Addended by: Minette Headland A on: 09/19/2020 09:07 AM   Modules accepted: Orders

## 2020-09-22 ENCOUNTER — Telehealth: Payer: Self-pay | Admitting: *Deleted

## 2020-09-22 ENCOUNTER — Ambulatory Visit (HOSPITAL_COMMUNITY)
Admission: RE | Admit: 2020-09-22 | Discharge: 2020-09-22 | Disposition: A | Discharge status: Home / Self Care | Payer: Medicare HMO | Source: Ambulatory Visit | Attending: Physician Assistant | Admitting: Physician Assistant

## 2020-09-22 ENCOUNTER — Other Ambulatory Visit: Payer: Self-pay

## 2020-09-22 DIAGNOSIS — R072 Precordial pain: Secondary | ICD-10-CM | POA: Diagnosis not present

## 2020-09-22 MED ORDER — METOPROLOL TARTRATE 5 MG/5ML IV SOLN
10.0000 mg | Freq: Once | INTRAVENOUS | Status: AC
  Administered 2020-09-22: 10 mg via INTRAVENOUS

## 2020-09-22 MED ORDER — METOPROLOL TARTRATE 5 MG/5ML IV SOLN
INTRAVENOUS | Status: AC
  Filled 2020-09-22: qty 10

## 2020-09-22 MED ORDER — DILTIAZEM HCL 25 MG/5ML IV SOLN
INTRAVENOUS | Status: AC
  Filled 2020-09-22: qty 5

## 2020-09-22 MED ORDER — METOPROLOL TARTRATE 5 MG/5ML IV SOLN
INTRAVENOUS | Status: AC
  Administered 2020-09-22: 10 mg via INTRAVENOUS
  Filled 2020-09-22: qty 10

## 2020-09-22 MED ORDER — IOHEXOL 350 MG/ML SOLN
95.0000 mL | Freq: Once | INTRAVENOUS | Status: AC | PRN
  Administered 2020-09-22: 95 mL via INTRAVENOUS

## 2020-09-22 MED ORDER — METOPROLOL TARTRATE 5 MG/5ML IV SOLN: 5.0000 mg | Freq: Once | INTRAVENOUS | Status: DC

## 2020-09-22 MED ORDER — METOPROLOL TARTRATE 5 MG/5ML IV SOLN: 10.0000 mg | Freq: Once | INTRAVENOUS | Status: AC

## 2020-09-22 MED ORDER — NITROGLYCERIN 0.4 MG SL SUBL
0.8000 mg | SUBLINGUAL_TABLET | Freq: Once | SUBLINGUAL | Status: AC
  Administered 2020-09-22: 0.8 mg via SUBLINGUAL

## 2020-09-22 MED ORDER — NITROGLYCERIN 0.4 MG SL SUBL
SUBLINGUAL_TABLET | SUBLINGUAL | Status: AC
  Filled 2020-09-22: qty 2

## 2020-09-22 NOTE — Progress Notes (Signed)
Mild coronary plaque seen on coronary CT. No sign of severe disease. Will discuss more during the next office visit.

## 2020-09-22 NOTE — Telephone Encounter (Signed)
    Telephone encounter was:  Successful.  09/22/2020 Name: Sandra Ruiz MRN: [id number] DOB: 1945-04-24  Sandra Ruiz is a 75 y.o. year old female who is a primary care patient of Caryl Ada . The community resource team was consulted for assistance with patient asked i call back around 3 to day as she was getting ready to have a ct of her heart  Care guide performed the following interventions: .  Follow Up Plan:  patient asked i call back around 3 to day as she was getting ready to have a ct of her heart [place], Care Management  [phone] [address] , [place] [address] Email : [email or web address]

## 2020-09-23 ENCOUNTER — Telehealth: Payer: Self-pay | Admitting: Medical

## 2020-09-23 NOTE — Telephone Encounter (Signed)
Pt called and states that she received a call from [facility] and was to get a call back late yesterday and di not receive.

## 2020-09-24 DIAGNOSIS — M961 Postlaminectomy syndrome, not elsewhere classified: Secondary | ICD-10-CM | POA: Diagnosis not present

## 2020-09-24 DIAGNOSIS — Z9889 Other specified postprocedural states: Secondary | ICD-10-CM | POA: Diagnosis not present

## 2020-09-24 DIAGNOSIS — M48062 Spinal stenosis, lumbar region with neurogenic claudication: Secondary | ICD-10-CM | POA: Diagnosis not present

## 2020-09-24 NOTE — Telephone Encounter (Signed)
Pt called back again stating she was expecting call back from someone with [facility] and has not received call. Advised pt that I would send message

## 2020-09-25 ENCOUNTER — Telehealth: Payer: Self-pay | Admitting: *Deleted

## 2020-09-25 NOTE — Telephone Encounter (Signed)
   Telephone encounter was:  Successful.  09/25/2020 Name: JACKLYNE BAIK MRN: [id number] DOB: February 19, 1945  FRANCELY CRAW is a 75 y.o. year old female who is a primary care patient of Caryl Ada . The community resource team was consulted for assistance with Patient provided information about medicaidand food stamps and am going to email her the rest of it to [email or web address]. Patient will call back for further needs   Care guide performed the following interventions: Patient provided with information about care guide support team and interviewed to confirm resource needs Follow up call placed to community resources to determine status of patients referral.  Follow Up Plan:  No further follow up planned at this time. The patient has been provided with needed resources.  [place], Care Management  [phone] [address] , [place] [address] Email : [email or web address]

## 2020-09-29 ENCOUNTER — Telehealth: Payer: Self-pay

## 2020-09-29 NOTE — Telephone Encounter (Signed)
Patient returning call.

## 2020-09-29 NOTE — Telephone Encounter (Signed)
Patient called w/results °

## 2020-09-29 NOTE — Telephone Encounter (Addendum)
Left voice message for patient to give office a call back for results. Will try calling again.  ----- Message from Almyra Deforest, [place] sent at 09/22/2020  5:00 PM EDT ----- Mild coronary plaque seen on coronary CT. No sign of severe disease. Will discuss more during the next office visit.

## 2020-10-01 LAB — CUP PACEART REMOTE DEVICE CHECK
Date Time Interrogation Session: 20220923042719
Implantable Pulse Generator Implant Date: 20191104

## 2020-10-02 ENCOUNTER — Ambulatory Visit (INDEPENDENT_AMBULATORY_CARE_PROVIDER_SITE_OTHER): Payer: Medicare HMO

## 2020-10-02 DIAGNOSIS — I4819 Other persistent atrial fibrillation: Secondary | ICD-10-CM | POA: Diagnosis not present

## 2020-10-06 ENCOUNTER — Other Ambulatory Visit: Payer: Self-pay

## 2020-10-06 NOTE — Patient Outreach (Signed)
[facility]) Care Management  10/06/2020  DAMIAH MCDONALD [birth year] [id number]   Telephone Screen  Referral Date: 10/03/2020 Referral Source: MD Office Referral Reason: "home eval/SW"    Outreach attempt # 1 to patient. No answer. RN CM left HIPAA compliant voicemail message along with contact info.    Plan: RN CM will make outreach attempt to patient within 3-4 business days.   Enzo Montgomery, RN,BSN,CCM [place] Management Telephonic Care Management Coordinator Direct Phone: [phone] Toll Free: [phone] Fax: [phone]

## 2020-10-07 ENCOUNTER — Other Ambulatory Visit: Payer: Self-pay

## 2020-10-07 NOTE — Patient Outreach (Addendum)
[facility]) Care Management  10/07/2020  Sandra Ruiz [birth year] [id number]   Telephone Screen   Referral Date: 10/03/2020 Referral Source: MD Office Referral Reason: "home eval/SW"   Incoming call from patient retuning RN CM call from yesterday. Discussed referral.She states that her and her spouse relocated to [place] about a year ago from [place]. They are still trying to get adjusted to "how things are done here in [place]" and different services/resources available to them. Patient has already spoken with a care guide and was provided info on Medicaid and food stamps. She reports she went to [facility] office and was told she is not eligible as income too high. She was unable to find info for food stamps application and RN CM provided patient with info on applying during this call. Patient reports that she switched to [facility] Medicare on 10/04/2020. Discussed with patient [facility] services and  transfer to another RN CM. Patient gave verbal consent.   Plan: RN CM will transfer case as patient has switched Medicare plans.   Enzo Montgomery, RN,BSN,CCM [place] Management Telephonic Care Management Coordinator Direct Phone: [phone] Toll Free: [phone] Fax: [phone]
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
## 2020-10-09 ENCOUNTER — Ambulatory Visit: Payer: Medicare Other | Admitting: Psychiatry

## 2020-10-09 ENCOUNTER — Encounter: Payer: Self-pay | Admitting: Psychiatry

## 2020-10-09 ENCOUNTER — Other Ambulatory Visit: Payer: Self-pay

## 2020-10-09 ENCOUNTER — Telehealth: Payer: Self-pay | Admitting: Psychiatry

## 2020-10-09 VITALS — BP 108/56 | HR 88 | Ht 62.0 in | Wt 222.0 lb

## 2020-10-09 DIAGNOSIS — S0990XD Unspecified injury of head, subsequent encounter: Secondary | ICD-10-CM

## 2020-10-09 DIAGNOSIS — H5462 Unqualified visual loss, left eye, normal vision right eye: Secondary | ICD-10-CM | POA: Diagnosis not present

## 2020-10-09 MED ORDER — GABAPENTIN 300 MG PO CAPS: 300.0000 mg | ORAL_CAPSULE | Freq: Every day | ORAL | 2 refills | Status: DC

## 2020-10-09 NOTE — Patient Instructions (Addendum)
Blood work to look for inflammation CT scan of head and blood vessels in head/neck Take extra 300 mg of gabapentin at bedtime

## 2020-10-09 NOTE — Telephone Encounter (Signed)
[employer] medicare order sent to [facility], [facility] they will reach out to the patient to schedule.  

## 2020-10-10 LAB — C-REACTIVE PROTEIN: CRP: 9 mg/L (ref 0–10)

## 2020-10-10 LAB — SEDIMENTATION RATE: Sed Rate: 36 mm/hr (ref 0–40)

## 2020-10-10 NOTE — Progress Notes (Signed)
[facility] Summary Report / Loop Recorder 

## 2020-10-13 ENCOUNTER — Telehealth: Payer: Self-pay

## 2020-10-13 ENCOUNTER — Ambulatory Visit: Payer: Medicare HMO | Admitting: Neurology

## 2020-10-13 NOTE — Telephone Encounter (Signed)
Contacted pt, informed her that her Blood work looks normal with no signs of inflammation. Advised to call the office with questions as she had none at the time. She was appreciative.

## 2020-10-13 NOTE — Telephone Encounter (Signed)
-----   Message from Genia Harold, MD sent at 10/13/2020  8:14 AM EDT ----- Blood work looks normal with no signs of inflammation

## 2020-10-14 ENCOUNTER — Encounter: Payer: Self-pay | Admitting: Medical

## 2020-10-16 ENCOUNTER — Telehealth: Payer: Self-pay

## 2020-10-16 NOTE — Telephone Encounter (Signed)
   [facility] Pre-operative Risk Assessment    Patient Name: Sandra Ruiz  DOB: 12/30/1945 MRN: [id number]  HEARTCARE STAFF:  - IMPORTANT!!!!!! Under Visit Info/Reason for Call, type in Other and utilize the format Clearance MM/DD/YY or Clearance TBD. Do not use dashes or single digits. - Please review there is not already an duplicate clearance open for this procedure. - If request is for dental extraction, please clarify the # of teeth to be extracted. - If the patient is currently at the dentist's office, call Pre-Op Callback Staff (MA/nurse) to input urgent request.  - If the patient is not currently in the dentist office, please route to the Pre-Op pool.  Request for surgical clearance:  What type of surgery is being performed? Colonoscopy/endoscopy   When is this surgery scheduled? 01/29/2021  What type of clearance is required (medical clearance vs. Pharmacy clearance to hold med vs. Both)? pharmacy  Are there any medications that need to be held prior to surgery and how long? Eliquis  Practice name and name of physician performing surgery? [facility]; Dr. Michail Sermon  What is the office phone number? [phone]   7.   What is the office fax number? [phone]  8.   Anesthesia type (None, local, MAC, general) ? Propofol   Orvan July 10/16/2020, 5:05 PM  _________________________________________________________________   (provider comments below)

## 2020-10-17 NOTE — Telephone Encounter (Signed)
Patient with diagnosis of A Fib on Eliquis for anticoagulation.    Procedure: Colonoscopy/endoscopy  Date of procedure: 01/29/21   CHA2DS2-VASc Score = 8  This indicates a 10.8% annual risk of stroke. The patient's score is based upon: CHF History: 1 HTN History: 1 Diabetes History: 1 Stroke History: 2 Vascular Disease History: 1 Age Score: 1 Gender Score: 1   CrCl 83 mL/min using adjusted body weight Platelet count 286K  Patient will be high risk off anticoagulation.  Recommend holding Eliquis for only 1 day prior to procedure and then resuming as soon as possible. Marland Kitchen

## 2020-10-17 NOTE — Telephone Encounter (Signed)
   Primary Cardiologist: Kirk Ruths, MD  Chart reviewed as part of pre-operative protocol coverage. Given past medical history and time since last visit, based on ACC/AHA guidelines, Sandra Ruiz would be at acceptable risk for the planned procedure without further cardiovascular testing.   Patient with diagnosis of A Fib on Eliquis for anticoagulation.     Procedure: Colonoscopy/endoscopy  Date of procedure: 01/29/21     CHA2DS2-VASc Score = 8  This indicates a 10.8% annual risk of stroke. The patient's score is based upon: CHF History: 1 HTN History: 1 Diabetes History: 1 Stroke History: 2 Vascular Disease History: 1 Age Score: 1 Gender Score: 1     CrCl 83 mL/min using adjusted body weight Platelet count 286K   Patient will be high risk off anticoagulation.  Recommend holding Eliquis for only 1 day prior to procedure and then resuming as soon as possible.  I will route this recommendation to the requesting party via Epic fax function and remove from pre-op pool.  Please call with questions.  Jossie Ng. Theresa Dohrman NP-C    10/17/2020, 8:31 AM [address] Office [phone] Fax [phone]

## 2020-10-27 ENCOUNTER — Telehealth: Payer: Self-pay | Admitting: Medical

## 2020-10-27 NOTE — Chronic Care Management (AMB) (Signed)
  Chronic Care Management   Note  10/27/2020 Name: AGATHA DUPLECHAIN MRN: [id number] DOB: 08/08/1945  ONEIDA MCKAMEY is a 74 y.o. year old female who is a primary care patient of Caryl Ada. I reached out to Charna Busman by phone today in response to a referral sent by Ms. Tilman Neat Scaglione's PCP, Tysinger, Camelia Eng, PA-C.   Ms. Null was given information about Chronic Care Management services today including:  CCM service includes personalized support from designated clinical staff supervised by her physician, including individualized plan of care and coordination with other care providers 24/7 contact phone numbers for assistance for urgent and routine care needs. Service will only be billed when office clinical staff spend 20 minutes or more in a month to coordinate care. Only one practitioner may furnish and bill the service in a calendar month. The patient may stop CCM services at any time (effective at the end of the month) by phone call to the office staff.   Patient agreed to services and verbal consent obtained.   Follow up plan:   Tatjana Secretary/administrator

## 2020-10-28 ENCOUNTER — Telehealth: Payer: Self-pay | Admitting: Pharmacist

## 2020-10-28 NOTE — Chronic Care Management (AMB) (Signed)
Chronic Care Management Pharmacy Assistant   Name: Sandra Ruiz  MRN: [id number] DOB: 04-Jan-1946  Sandra Ruiz is an 75 y.o. year old female who presents for herinitial CCM visit with the clinical pharmacist.  Reason for Encounter: Chart Prep for initial visit with Jeni Salles, Pharm D on 10/30/20.   Conditions to be addressed/monitored: CHF, HTN, HLD, DMII, Depression, Hypothyroidism, and persistent Atrial Fibrillation, unstable angina, OSA, Thyroid Nodule, chronic anemia and chronic bilateral back pain.    Recent office visits:  09/18/20 Sandra Bode PA-C (PCP) - seen for unstable angina and other chronic conditions. Referral placed to community care coordination. Discontinued metoprolol tartrate. Follow up in 1 month.   08/26/20 Sandra Bode PA-C (PCP) - seen for encounter for health maintenance examination and other chronic conditions. Discontinued ferrous gluconate. Patient received pneumonia vaccine. No follow up noted.   06/19/20 Sandra Bode PA-C (PCP) - seen for fall and other chron ic conditions. No medication changes. Return pending scan.   05/01/20 Sandra Bode PA-C (PCP) - seen for acute nonintractable headache and other issues. Increased duloxetine to 73m 2 times daily from 632mdaily. Follow up in 3 months for medicare wellness exam.   Recent consult visits:  10/09/20 Sandra Ruiz (Neurology) - seen for vision loss of left eye. increased gabapentin to additional 30065mt bedtime, in addition to the 800m65mll at bedtime for a total of 1100mg36mbedtime. Follow up in 3 months.   09/10/20 [facility]) - seen for anemia and fatty liver with constipation. No medication changes or follow up noted.   09/01/20 Sandra [facility]iology) - seen for pacer check for persistent A-FIB. No medication changes or follow up noted.   08/27/20 Sandra Ruiz) - seen for triamcinolone injection and imaging of lumbar sacral area due to  spinal stenosis. No medication changes or follow up noted.   07/28/20 Sandra [facility]iology/Emergency Medicine) - seen for persistent A-FIB. Loop recorder. No medication changes. No follow up noted.   06/26/20 Sandra Fenton PA (Cardiology) - seen for persistent A-FIB. No medication changes. Follow up in 6 months.   06/23/20 Sandra [facility]Cardiology/Emergency Medicine) - seen for persistent A-FIB. Loop recorder. No medication changes. No follow up noted.   05/27/20 Sandra RuthsCardiology) - seen for ASD/ atrial septal defect and other issues. Dec eased lisinopril to 10mg 27my from 30mg d58m. Discontinued folic acid. Follow up in 6 months.  Hospital visits:  Medication Reconciliation was completed by comparing discharge summary, patient's EMR and Pharmacy list, and upon discussion with patient.  [facility] due to unstable angina.  [facility] -started aspirin and nitroglycerin  Medication Changes at Hospital Discharge: -Changed None.  Medications Discontinued at Hospital Discharge: -Stopped metformin and methocarbamol.   Medications that remain the same after Hospital Discharge:??  -All other medications will remain the same.    Medications: Outpatient Encounter Medications as of 10/28/2020  Medication Sig   ACCU-CHEK GUIDE test strip TEST 1 - 2 TIMES DAILY   Accu-Chek Softclix Lancets lancets CHECK BLOOD SUGAR ONE TO TWO TIMES DAILY.   ALPRAZolam (XANAX) 0.25 MG tablet Take 1 tablet (0.25 mg total) by mouth 2 (two) times daily as needed for anxiety.   apixaban (ELIQUIS) 5 MG TABS tablet Take 1 tablet (5 mg total) by mouth 2 (two) times daily.   aspirin 81 MG chewable tablet Chew 1 tablet (81  mg total) by mouth daily.   Blood Glucose Monitoring Suppl (ACCU-CHEK GUIDE ME) w/Device KIT Test 1-2 times daily   carvedilol (COREG) 25 MG tablet Take 1 tablet (25 mg total) by  mouth 2 (two) times daily with a meal.   diltiazem (DILT-XR) 180 MG 24 hr capsule TAKE 1 CAPSULE (180 MG TOTAL) BY MOUTH DAILY.   DULoxetine (CYMBALTA) 60 MG capsule Take 1 capsule (60 mg total) by mouth 2 (two) times daily. (Patient taking differently: Take 60 mg by mouth daily.)   ezetimibe (ZETIA) 10 MG tablet Take 1 tablet (10 mg total) by mouth daily.   gabapentin (NEURONTIN) 300 MG capsule Take 1 capsule (300 mg total) by mouth at bedtime. Take in addition to 800 mg pill at bedtime for a total of 1100 mg at bedtime.   gabapentin (NEURONTIN) 800 MG tablet Take 1 tablet (800 mg total) by mouth 3 (three) times daily.   glyBURIDE (DIABETA) 1.25 MG tablet Take 1 tablet (1.25 mg total) by mouth every morning.   levothyroxine (SYNTHROID) 75 MCG tablet TAKE 1 TABLET EVERY DAY BEFORE BREAKFAST   lisinopril (ZESTRIL) 10 MG tablet Take 1 tablet (10 mg total) by mouth daily.   nitroGLYCERIN (NITROSTAT) 0.4 MG SL tablet Place 1 tablet (0.4 mg total) under the tongue every 5 (five) minutes as needed for chest pain.   omeprazole (PRILOSEC) 20 MG capsule TAKE 1 CAPSULE EVERY DAY   oxybutynin (DITROPAN-XL) 10 MG 24 hr tablet Take 1 tablet (10 mg total) by mouth 2 (two) times daily.   pravastatin (PRAVACHOL) 40 MG tablet Take 1 tablet (40 mg total) by mouth every evening.   sitaGLIPtin-metformin (JANUMET) 50-1000 MG tablet Take 1 tablet by mouth 2 (two) times daily with a meal.   No facility-administered encounter medications on file as of 10/28/2020.   Fill history: Eliquis 5 mg tablet 04/08/2020 90   carvedilol 25 mg tablet 07/24/2020 90   duloxetine 60 mg capsule,delayed release 07/24/2020 90   ezetimibe 10 mg tablet 08/18/2020 90   gabapentin 800 mg tablet 08/29/2020 90   Accu-Chek Softclix Lancets 07/28/2020 90   levothyroxine 75 mcg tablet 07/24/2020 90   lisinopril 10 mg tablet 05/27/2020 90   methocarbamol 500 mg tablet 07/10/2020 90 180    metoprolol (LOPRESSOR) tablet 09/12/2020 1    nitroGLYCERIN (NITROSTAT) SL tablet 09/09/2020 8   omeprazole 20 mg capsule,delayed release 08/29/2020 90   oxybutynin chloride ER 10 mg tablet,extended release 24 hr 08/29/2020 90   pravastatin 40 mg tablet 08/18/2020 90   Janumet 50 mg-1,000 mg tablet 03/04/2020 90   diltiazem ER (XR/XT) 180 mg capsule,extended release 24 hr, controlled 07/25/2020 90   glyburide 1.25 mg tablet 07/24/2020 90   metformin ER 750 mg tablet,extended release 24 hr 08/29/2020 90   Initial Questions: Have you seen any other providers since your last visit?   Any changes in your medications or health?   Any side effects from any medications?   Do you have an symptoms or problems not managed by your medications?   Any concerns about your health right now?   Has your provider asked that you check blood pressure, blood sugar, or follow special diet at home?   Do you get any type of exercise on a regular basis?   Can you think of a goal you would like to reach for your health?   Do you have any problems getting your medications?   Is there anything that you would like to discuss during  the appointment?   Please bring medications and supplements to appointment.  Notes: Spoke with Jasper Riling at [facility] who stated that patient canceled the Janumet prescription. Patient last got her lisinopril filled on 05/28/20 for 90 day supply and the pravastatin as below.  Unsuccessful at reaching patient to complete initial questions.  Care Gaps:  AWV - completed on 08/26/20 Tetanus/TDAP - never done Zoster vaccines - never done Covid-19 vaccine booster 3 - overdue since 21 Flu vaccine - due Recent A1C: Lab Results  Component Value Date   HGBA1C 5.7 (H) 08/26/2020    Recent PCP BP: 110/60 P: 89 on 09/18/20   Star Rating Drugs:  L[facility]ril 75m - [facility] 50-100088m  last  [facility] on [facility]/20 [phone]

## 2020-10-29 ENCOUNTER — Ambulatory Visit: Payer: Medicare HMO | Admitting: Physician Assistant

## 2020-10-29 ENCOUNTER — Ambulatory Visit: Payer: Self-pay | Admitting: Physician Assistant

## 2020-10-29 LAB — CUP PACEART REMOTE DEVICE CHECK
Date Time Interrogation Session: 20221026043212
Implantable Pulse Generator Implant Date: 20191104

## 2020-10-29 NOTE — Progress Notes (Deleted)
Chronic Care Management Pharmacy Note  10/29/2020 Name:  Sandra Ruiz MRN:  [id number] DOB:  18-Apr-1945  Summary: ***  Recommendations/Changes made from today's visit: ***  Plan: ***   Subjective: Sandra Ruiz is an 75 y.o. year old female who is a primary patient of Caryl Ada.  The CCM team was consulted for assistance with disease management and care coordination needs.    Engaged with patient by telephone for initial visit in response to provider referral for pharmacy case management and/or care coordination services.   Consent to Services:  The patient was given the following information about Chronic Care Management services today, agreed to services, and gave verbal consent: 1. CCM service includes personalized support from designated clinical staff supervised by the primary care provider, including individualized plan of care and coordination with other care providers 2. 24/7 contact phone numbers for assistance for urgent and routine care needs. 3. Service will only be billed when office clinical staff spend 20 minutes or more in a month to coordinate care. 4. Only one practitioner may furnish and bill the service in a calendar month. 5.The patient may stop CCM services at any time (effective at the end of the month) by phone call to the office staff. 6. The patient will be responsible for cost sharing (co-pay) of up to 20% of the service fee (after annual deductible is met). Patient agreed to services and consent obtained.  Patient Care Team: Tysinger, Camelia Eng, PA-C as PCP - General (Family Medicine) Stanford Breed Denice Bors, MD as PCP - Cardiology (Cardiology) Viona Gilmore, [facility] as Pharmacist (Pharmacist)  Recent office visits: 09/18/20 Chana Bode PA-C (PCP) - seen for unstable angina and other chronic conditions. Referral placed to community care coordination. Discontinued glyburide and continue Janumet.   08/26/20 Chana Bode PA-C (PCP) - seen for  encounter for health maintenance examination and other chronic conditions. Discontinued ferrous gluconate. Patient received pneumonia vaccine. No follow up noted.    06/19/20 Chana Bode PA-C (PCP) - seen for fall and other chronic conditions. No medication changes. Return pending scan.    05/01/20 Chana Bode PA-C (PCP) - seen for acute nonintractable headache and other issues. Increased duloxetine to 76m 2 times daily from 637mdaily. Follow up in 3 months for medicare wellness exam.     Recent consult visits: 10/09/20 JeGenia HaroldD (Neurology) - seen for vision loss of left eye. increased gabapentin to additional 30080mt bedtime, in addition to the 800m69mll at bedtime for a total of 1100mg42mbedtime. Follow up in 3 months.    09/10/20 WilliArta SilenceGastroenterology) - seen for anemia and fatty liver with constipation. No medication changes or follow up noted.    09/01/20 JamesThompson GrayerCardiology) - seen for pacer check for persistent A-FIB. No medication changes or follow up noted.    08/27/20 Dave Lenord Carb[facility]hesiology) - seen for triamcinolone injection and imaging of lumbar sacral area due to spinal stenosis. No medication changes or follow up noted.    07/28/20 ElizaRoma KayserCardiology/Emergency Medicine) - seen for persistent A-FIB. Loop recorder. No medication changes. No follow up noted.    06/26/20 Clint Fenton PA (Cardiology) - seen for persistent A-FIB. No medication changes. Follow up in 6 months.    06/23/20 ElizaRoma KayserCardiology/Emergency Medicine) - seen for persistent A-FIB. Loop recorder. No medication changes. No follow up noted.    05/27/20 BrianKirk RuthsCardiology) - seen for ASD/ atrial septal  defect and other issues. Dec eased lisinopril to 17m daily from 344mdaily. Discontinued folic acid. Follow up in 6 months.   Hospital visits: Medication Reconciliation was completed by comparing discharge summary, patient's EMR and Pharmacy  list, and upon discussion with patient.   [facility].   [facility] -started aspirin and nitroglycerin   Medication Changes at Hospital Discharge: -Changed None.   Medications Discontinued at Hospital Discharge: -Stopped metformin and methocarbamol.    Medications that remain the same after Hospital Discharge:??  -All other medications will remain the same.       Objective:  Lab Results  Component Value Date   CREATININE 0.66 09/06/2020   BUN 21 09/06/2020   GFRNONAA >60 09/06/2020   GFRAA 91 12/07/2019   NA 134 (L) 09/06/2020   K 4.2 09/06/2020   CALCIUM 9.6 09/06/2020   CO2 25 09/06/2020   GLUCOSE 68 (L) 09/06/2020    Lab Results  Component Value Date/Time   HGBA1C 5.7 (H) 08/26/2020 11:30 AM   HGBA1C 6.3 (H) 02/04/2020 10:44 AM    Last diabetic Eye exam:  Lab Results  Component Value Date/Time   HMDIABEYEEXA No Retinopathy 12/21/2019 12:00 AM    Last diabetic Foot exam: No results found for: HMDIABFOOTEX   Lab Results  Component Value Date   CHOL 151 05/27/2020   HDL 50 05/27/2020   LDLCALC 87 05/27/2020   TRIG 72 05/27/2020   CHOLHDL 3.0 05/27/2020    Hepatic Function Latest Ref Rng & Units 08/26/2020 05/27/2020 10/24/2019  Total Protein 6.0 - 8.5 g/dL 6.4 6.0 6.2  Albumin 3.7 - 4.7 g/dL 4.2 4.3 4.1  AST 0 - 40 IU/L 24 40 42(H)  ALT 0 - 32 IU/L 17 33(H) 26  Alk Phosphatase 44 - 121 IU/L 124(H) 101 114  Total Bilirubin 0.0 - 1.2 mg/dL 0.3 0.3 0.3  Bilirubin, Direct 0.00 - 0.40 mg/dL - 0.11 <0.10    Lab Results  Component Value Date/Time   TSH 2.010 02/04/2020 10:44 AM   TSH 1.590 08/01/2019 10:32 AM    CBC Latest Ref Rng & Units 09/07/2020 09/06/2020 08/26/2020  WBC 4.0 - 10.5 K/uL 7.7 9.3 7.1  Hemoglobin 12.0 - 15.0 g/dL 10.2(L) 10.8(L) 10.2(L)  Hematocrit 36.0 - 46.0 % 32.0(L) 33.5(L) 32.7(L)  Platelets 150 - 400 K/uL 286 352 294     No results found for: VD25OH  Clinical ASCVD: {YES/NO:21197} The 10-year ASCVD risk score (Arnett DK, et al., 2019) is: 22%   Values used to calculate the score:     Age: [age]     Sex: Female     Is Non-Hispanic African American: No     Diabetic: Yes     Tobacco smoker: No     Systolic Blood Pressure: 10803mHg     Is BP treated: Yes     HDL Cholesterol: 50 mg/dL     Total Cholesterol: 151 mg/dL    [facility] 08/26/2020 05/01/2020 05/02/2019  Decreased Interest 1 3 0  Down, Depressed, Hopeless 1 3 0  PHQ - 2 Score 2 6 0  Altered sleeping 3 3 -  Tired, decreased energy 0 3 -  Change in appetite 3 1 -  Feeling bad or failure about yourself  3 2 -  Trouble concentrating 3 3 -  Moving slowly or fidgety/restless 0 2 -  Suicidal thoughts 0 1 -  PHQ-9  Score 14 21 -  Difficult doing work/chores Somewhat difficult Very difficult -     ***Other: (CHADS2VASc if Afib, MMRC or CAT for COPD, ACT, DEXA)  Social History   Tobacco Use  Smoking Status Former  Smokeless Tobacco Never   BP Readings from Last 3 Encounters:  10/09/20 (!) 108/56  09/22/20 (!) 105/58  09/18/20 110/60   Pulse Readings from Last 3 Encounters:  10/09/20 88  09/22/20 74  09/18/20 89   Wt Readings from Last 3 Encounters:  10/09/20 222 lb (100.7 kg)  09/18/20 213 lb 9.6 oz (96.9 kg)  09/07/20 215 lb 14.4 oz (97.9 kg)   BMI Readings from Last 3 Encounters:  10/09/20 40.60 kg/m  09/18/20 40.36 kg/m  09/07/20 40.79 kg/m    Assessment/Interventions: Review of patient past medical history, allergies, medications, health status, including review of consultants reports, laboratory and other test data, was performed as part of comprehensive evaluation and provision of chronic care management services.   SDOH:  (Social Determinants of Health) assessments and interventions performed: {yes/no:20286}  SDOH Screenings   Alcohol Screen: Not on file  Depression (PHQ2-9): Medium Risk    PHQ-2 Score: 14  Financial Resource Strain: Not on file  Food Insecurity: Not on file  Housing: Not on file  Physical Activity: Not on file  Social Connections: Not on file  Stress: Not on file  Tobacco Use: Medium Risk   Smoking Tobacco Use: Former   Smokeless Tobacco Use: Never   Passive Exposure: Not on file  Transportation Needs: Not on file    [place]  Allergies  Allergen Reactions   Baclofen Swelling   Crestor [Rosuvastatin]     Extreme myalgias   Verapamil Swelling   Silicone Itching and Rash    Medications Reviewed Today     Reviewed by Anda Latina, RN (Registered Nurse) on 10/09/20 at [phone]  Med List Status: <None>   Medication Order Taking? Sig Documenting Provider Last Dose Status Informant  ACCU-CHEK GUIDE test strip [id number]  TEST 1 - 2 TIMES DAILY Tysinger, Camelia Eng, PA-C  Active Self  Accu-Chek Softclix Lancets lancets [id number]  CHECK BLOOD SUGAR ONE TO TWO TIMES DAILY. Tysinger, Camelia Eng, PA-C  Active Self  ALPRAZolam Duanne Moron) 0.25 MG tablet [id number]  Take 1 tablet (0.25 mg total) by mouth 2 (two) times daily as needed for anxiety. Tysinger, Camelia Eng, PA-C  Active Self  apixaban (ELIQUIS) 5 MG TABS tablet [id number]  Take 1 tablet (5 mg total) by mouth 2 (two) times daily. Fenton, Clint R, [place]  Active Self  aspirin 81 MG chewable tablet [id number]  Chew 1 tablet (81 mg total) by mouth daily. Cristal Deer, MD  Active   Blood Glucose Monitoring Suppl [facility] GUIDE ME) w/Device KIT [id number]  Test 1-2 times daily [name], PA-C  Active Self  carvedilol (COREG) 25 MG tablet [id number]  Take 1 tablet (25 mg total) by mouth 2 (two) times daily with a meal. Tysinger, Camelia Eng, PA-C  Active Self  diltiazem (DILT-XR) 180 MG 24 hr capsule [id number]  TAKE 1 CAPSULE (180 MG TOTAL) BY MOUTH DAILY. Tysinger, Camelia Eng, PA-C  Active Self  DULoxetine (CYMBALTA) 60 MG capsule [id number]  Take 1 capsule (60 mg total) by mouth 2 (two) times daily.  Patient taking  differently: Take 60 mg by mouth daily.   Tysinger, Camelia Eng, PA-C  Active Self  ezetimibe (ZETIA) 10 MG tablet [id number]  Take 1 tablet (10 mg total) by mouth daily.  Tysinger, Camelia Eng, PA-C  Active Self  gabapentin (NEURONTIN) 800 MG tablet [id number]  Take 1 tablet (800 mg total) by mouth 3 (three) times daily. Tysinger, Camelia Eng, PA-C  Active   glyBURIDE (DIABETA) 1.25 MG tablet [id number]  Take 1 tablet (1.25 mg total) by mouth every morning. Tysinger, Camelia Eng, PA-C  Active Self  levothyroxine (SYNTHROID) 75 MCG tablet [id number]  TAKE 1 TABLET EVERY DAY BEFORE BREAKFAST Tysinger, Camelia Eng, PA-C  Active Self  lisinopril (ZESTRIL) 10 MG tablet [id number]  Take 1 tablet (10 mg total) by mouth daily. Lelon Perla, MD  Active Self  nitroGLYCERIN (NITROSTAT) 0.4 MG SL tablet [id number]  Place 1 tablet (0.4 mg total) under the tongue every 5 (five) minutes as needed for chest pain. Cristal Deer, MD  Active   omeprazole (PRILOSEC) 20 MG capsule [id number]  TAKE 1 CAPSULE EVERY DAY Tysinger, Camelia Eng, PA-C  Active Self  oxybutynin (DITROPAN-XL) 10 MG 24 hr tablet [id number]  Take 1 tablet (10 mg total) by mouth 2 (two) times daily. Tysinger, Camelia Eng, PA-C  Active Self  pravastatin (PRAVACHOL) 40 MG tablet [id number]  Take 1 tablet (40 mg total) by mouth every evening. [name], PA-C  Expired 09/06/20 2359 Self  sitaGLIPtin-metformin (JANUMET) 50-1000 MG tablet [id number]  Take 1 tablet by mouth 2 (two) times daily with a meal. [provider]  Active Self            Patient Active Problem List   Diagnosis Date Noted   Change in weight 09/18/2020   Needs flu shot 09/18/2020   Unstable angina ([place]) 09/06/2020   Chronic diastolic CHF (congestive heart failure) ([place]) 09/06/2020   Patient is Jehovah's Witness 08/27/2020   Need for prophylactic vaccination against Streptococcus pneumoniae (pneumococcus) 08/27/2020   Blood transfusion declined because patient is Jehovah's Witness  08/27/2020   Financial difficulties 08/27/2020   Advanced directives, counseling/discussion 08/26/2020   Bilateral thoracic back pain 06/19/2020   History of Lyme disease 05/01/2020   Screen for colon cancer 05/01/2020   Chronic anemia 05/01/2020   Diplopia 05/01/2020   Chronic bilateral back pain 05/01/2020   Persistent atrial fibrillation ([place]) 02/07/2020   Secondary hypercoagulable state ([place]) 02/07/2020   Screening for colon cancer 02/04/2020   Need for influenza vaccination 11/01/2019   Need for vaccination 11/01/2019   Hypertension associated with diabetes ([place]) 08/02/2019   Trigger finger of left thumb 08/01/2019   Depression, major, single episode, moderate ([place]) 08/01/2019   Statin intolerance 05/30/2019   Thyroid nodule 05/02/2019   Trigger index finger of right hand 05/02/2019   Hypothyroidism 05/02/2019   Diabetes mellitus ([place]) 01/02/2019   Hyperlipidemia 01/02/2019   High risk medication use 01/02/2019   History of TIA (transient ischemic attack) 01/02/2019   Chronic neck pain 01/02/2019   Essential hypertension, benign 01/02/2019   Fatty liver 01/02/2019   OSA (obstructive sleep apnea) 01/02/2019    Immunization History  Administered Date(s) Administered   Fluad Quad(high Dose 65+) 11/01/2019   Janssen (J&J) SARS-COV-2 Vaccination 03/18/2019   Moderna Sars-Covid-2 Vaccination 11/01/2019   Pneumococcal Conjugate-13 08/26/2020    Conditions to be addressed/monitored:  Hypertension, Hyperlipidemia, Diabetes, Atrial Fibrillation, Heart Failure, Hypothyroidism, and Depression  There are no care plans that you recently modified to display for this patient.   Current Barriers:  {pharmacybarriers:24917}  Pharmacist Clinical Goal(s):  Patient will {PHARMACYGOALCHOICES:24921} through collaboration with PharmD and provider.   Interventions: 1:1 collaboration with Tysinger, Camelia Eng, PA-C regarding development and update of  comprehensive plan of care as evidenced  by provider attestation and co-signature Inter-disciplinary care team collaboration (see longitudinal plan of care) Comprehensive medication review performed; medication list updated in electronic medical record  BP Readings from Last 3 Encounters:  10/09/20 (!) 108/56  09/22/20 (!) 105/58  09/18/20 110/60    Hypertension (BP goal <130/80) -Controlled -Current treatment: Lisinopril 10 mg 1 tablet daily Diltiazem 180 mg 1 capsule daily Carvedilol 25 mg 1 tablet twice daily -Medications previously tried: ***  -Current home readings: *** -Current dietary habits: *** -Current exercise habits: *** -{ACTIONS;DENIES/REPORTS:21021675::"Denies"} hypotensive/hypertensive symptoms -Educated on {CCM BP Counseling:25124} -Counseled to monitor BP at home ***, document, and provide log at future appointments -{CCMPHARMDINTERVENTION:25122}  Lab Results  Component Value Date   CHOL 151 05/27/2020   HDL 50 05/27/2020   LDLCALC 87 05/27/2020   TRIG 72 05/27/2020   CHOLHDL 3.0 05/27/2020     Hyperlipidemia: (LDL goal < 70) -{US controlled/uncontrolled:25276} -Current treatment: Ezetimibe 10 mg 1 tablet daily Pravastatin 40 mg 1 tablet every evening -Medications previously tried: ***  -Current dietary patterns: *** -Current exercise habits: *** -Educated on {CCM HLD Counseling:25126} -{CCMPHARMDINTERVENTION:25122} PCSK9? Increase pravastatin?   Lab Results  Component Value Date   HGBA1C 5.7 (H) 08/26/2020    Diabetes (A1c goal <7%) -Controlled -Current medications: Janumet 50-1000 mg 1 tablet twice daily -Medications previously tried: glyburide  -Current home glucose readings fasting glucose: *** post prandial glucose: *** -{ACTIONS;DENIES/REPORTS:21021675::"Denies"} hypoglycemic/hyperglycemic symptoms -Current meal patterns:  breakfast: ***  lunch: ***  dinner: *** snacks: *** drinks: *** -Current exercise: *** -Educated on {CCM DM COUNSELING:25123} -Counseled to  check feet daily and get yearly eye exams -{CCMPHARMDINTERVENTION:25122} -switch to glp1? For ASCVD benefit  Atrial Fibrillation (Goal: prevent stroke and major bleeding) -{US controlled/uncontrolled:25276} -CHADSVASC: *** -Current treatment: Rate control: Diltiazem 180 mg 1 capsule daily, Carvedilol 25 mg 1 tablet twice daily Anticoagulation: Eliquis 5 mg 1 tablet twice daily -Medications previously tried: *** -Home BP and HR readings: ***  -Counseled on {CCMAFIBCOUNSELING:25120} -{CCMPHARMDINTERVENTION:25122}  History of TIA (Goal: ***) -{US controlled/uncontrolled:25276} -Current treatment  Aspirin 81 mg 1 tablet daily -Medications previously tried: ***  -{CCMPHARMDINTERVENTION:25122} -do we need?   Heart Failure (Goal: manage symptoms and prevent exacerbations) -{US controlled/uncontrolled:25276} -Last ejection fraction: *** (Date: ***) -HF type: Diastolic -NYHA Class: {CHL HP Upstream Pharm NYHA Class:[phone]} -AHA HF Stage: {CHL HP Upstream Pharm AHA HF Stage:[id number]} -Current treatment: Lisinopril Carvedilol 25 mg 1 tablet twice daily -Medications previously tried: ***  -Current home BP/HR readings: *** -Current dietary habits: *** -Current exercise habits: *** -Educated on {CCM HF Counseling:25125} -{CCMPHARMDINTERVENTION:25122}  Depression/Anxiety (Goal: ***) -{US controlled/uncontrolled:25276} -Current treatment: Duloxetine 60 mg 1 capsule twice daily? Xanax 0.25 mg 1 tablet as needed -Medications previously tried/failed: *** -PHQ9: *** -GAD7: *** -Connected with *** for mental health support -Educated on {CCM mental health counseling:25127} -{CCMPHARMDINTERVENTION:25122}  Neuropathy? (Goal: ***) -{US controlled/uncontrolled:25276} -Current treatment  Gabapentin 300 mg 1 capsule at bedtime Gabapentin 800 mg 1 tablet three times daily -Medications previously tried: ***  -{CCMPHARMDINTERVENTION:25122}  Overactive bladder (Goal: ***) -{US  controlled/uncontrolled:25276} -Current treatment  Oxybutynin XL 10 mg 1 tablet daily -Medications previously tried: ***  -{CCMPHARMDINTERVENTION:25122}   Hypothyroidism (Goal: ***) -Controlled -Current treatment  Levothyroxine 75 mcg 1 tablet daily -Medications previously tried: ***  -{CCMPHARMDINTERVENTION:25122}  GERD (Goal: ***) -{US controlled/uncontrolled:25276} -Current treatment  Omeprazole 20 mg 1 capsule daily -Medications previously tried: ***  -{CCMPHARMDINTERVENTION:25122}   Health Maintenance -Vaccine gaps: tetanus, shingrix, COVID booster, influenza -Current therapy:  Nitroglycerin 0.4 mg  1 tablet as needed -Educated on {ccm supplement counseling:25128} -{CCM Patient satisfied:25129} -{CCMPHARMDINTERVENTION:25122}  Patient Goals/Self-Care Activities Patient will:  - {pharmacypatientgoals:24919}  Follow Up Plan: {CM FOLLOW UP WSBB:79536}   Medication Assistance: {MEDASSISTANCEINFO:25044}  Compliance/Adherence/Medication fill history: Care Gaps: Tetanus/TDAP - never done Zoster vaccines - never done Covid-19 vaccine booster 3 - overdue since 21 Flu vaccine - due Recent A1C: 5.7 Recent PCP BP: 110/60 P: 89 on 09/18/20  Star-[facility]: Lisinopril 27m - [facility]20 [facility] - Metformin 50-100048m  [facility]  [facility] [place]one: [phone]: [phone]inteUG [facility] [address]one: [phone]: [phone] [facility] CenterUG ST[facility] #15#[id number] Mann[address] [place] [facility] [place] [id number]: [phone]: [phone]  pill box? {Yes or If no, why not?:20788} Pt endorses ***% compliance  We discussed: {Pharmacy options:24294} Patient decided to: {US Pharmacy [facility]aK[facility]are Plan and Follow Up Patient Decision:  {FOLLOWUP:24991}  Plan: {CM FOLLOW UP PLATVDF:17921}adJeni SallesharmD, BCAWashington Gastroenterologyinical Pharmacist {MP practice sites:26434} [phone]

## 2020-10-30 ENCOUNTER — Telehealth: Payer: Self-pay | Admitting: Pharmacist

## 2020-10-30 ENCOUNTER — Telehealth: Payer: Medicare Other

## 2020-10-30 NOTE — Telephone Encounter (Signed)
  Chronic Care Management   Outreach Note  10/30/2020 Name: LILLIANE SPOSITO MRN: [id number] DOB: [birth year]  Referred by: [name], PA-C  Patient had a phone appointment scheduled with clinical pharmacist today.  An unsuccessful telephone outreach was attempted today. The patient was referred to the pharmacist for assistance with care management and care coordination.   If possible, a message was left to return call to: [phone] or to [facility]: [place], PharmD, [place] at [place]

## 2020-11-04 ENCOUNTER — Ambulatory Visit (INDEPENDENT_AMBULATORY_CARE_PROVIDER_SITE_OTHER): Payer: Medicare Other

## 2020-11-04 DIAGNOSIS — I4819 Other persistent atrial fibrillation: Secondary | ICD-10-CM | POA: Diagnosis not present

## 2020-11-07 ENCOUNTER — Telehealth: Payer: Self-pay

## 2020-11-07 MED ORDER — APIXABAN 5 MG PO TABS: 5.0000 mg | ORAL_TABLET | Freq: Two times a day (BID) | ORAL | 1 refills | Status: DC

## 2020-11-07 MED ORDER — LISINOPRIL 10 MG PO TABS: 10.0000 mg | ORAL_TABLET | Freq: Every day | ORAL | 1 refills | Status: DC

## 2020-11-07 MED ORDER — OXYBUTYNIN CHLORIDE ER 10 MG PO TB24: 10.0000 mg | ORAL_TABLET | Freq: Two times a day (BID) | ORAL | 1 refills | Status: DC

## 2020-11-07 MED ORDER — EZETIMIBE 10 MG PO TABS: 10.0000 mg | ORAL_TABLET | Freq: Every day | ORAL | 1 refills | Status: DC

## 2020-11-07 NOTE — Telephone Encounter (Signed)
Pt. Called stating she is switching pharmacies to [facility] because she got new ins. And they wont her to get her scripts there now. She needs her Lisinopril, oxybutynin, ezetimibe, and eliquis [facility].

## 2020-11-07 NOTE — Telephone Encounter (Signed)
Refilled medication

## 2020-11-11 NOTE — Progress Notes (Signed)
[facility] Summary Report / Loop Recorder 

## 2020-11-12 ENCOUNTER — Encounter (HOSPITAL_COMMUNITY): Payer: Self-pay | Admitting: Physician Assistant

## 2020-11-12 ENCOUNTER — Ambulatory Visit (HOSPITAL_COMMUNITY)
Admission: RE | Admit: 2020-11-12 | Discharge: 2020-11-12 | Disposition: A | Discharge status: Home / Self Care | Payer: Medicare Other | Source: Ambulatory Visit | Attending: Physician Assistant | Admitting: Physician Assistant

## 2020-11-12 ENCOUNTER — Other Ambulatory Visit: Payer: Self-pay

## 2020-11-12 VITALS — BP 122/72 | HR 78 | Ht 62.0 in | Wt 225.6 lb

## 2020-11-12 DIAGNOSIS — Z87891 Personal history of nicotine dependence: Secondary | ICD-10-CM | POA: Insufficient documentation

## 2020-11-12 DIAGNOSIS — I110 Hypertensive heart disease with heart failure: Secondary | ICD-10-CM | POA: Insufficient documentation

## 2020-11-12 DIAGNOSIS — Z7901 Long term (current) use of anticoagulants: Secondary | ICD-10-CM | POA: Insufficient documentation

## 2020-11-12 DIAGNOSIS — E669 Obesity, unspecified: Secondary | ICD-10-CM | POA: Diagnosis not present

## 2020-11-12 DIAGNOSIS — I4819 Other persistent atrial fibrillation: Secondary | ICD-10-CM | POA: Insufficient documentation

## 2020-11-12 DIAGNOSIS — M7989 Other specified soft tissue disorders: Secondary | ICD-10-CM | POA: Insufficient documentation

## 2020-11-12 DIAGNOSIS — Z713 Dietary counseling and surveillance: Secondary | ICD-10-CM | POA: Insufficient documentation

## 2020-11-12 DIAGNOSIS — Z6841 Body Mass Index (BMI) 40.0 and over, adult: Secondary | ICD-10-CM | POA: Diagnosis not present

## 2020-11-12 DIAGNOSIS — D6869 Other thrombophilia: Secondary | ICD-10-CM | POA: Insufficient documentation

## 2020-11-12 DIAGNOSIS — Z8249 Family history of ischemic heart disease and other diseases of the circulatory system: Secondary | ICD-10-CM | POA: Diagnosis not present

## 2020-11-12 DIAGNOSIS — G4733 Obstructive sleep apnea (adult) (pediatric): Secondary | ICD-10-CM | POA: Insufficient documentation

## 2020-11-12 DIAGNOSIS — I2510 Atherosclerotic heart disease of native coronary artery without angina pectoris: Secondary | ICD-10-CM | POA: Insufficient documentation

## 2020-11-12 DIAGNOSIS — I5032 Chronic diastolic (congestive) heart failure: Secondary | ICD-10-CM | POA: Diagnosis not present

## 2020-11-12 LAB — BASIC METABOLIC PANEL
Anion gap: 8 (ref 5–15)
BUN: 8 mg/dL (ref 8–23)
CO2: 25 mmol/L (ref 22–32)
Calcium: 8.9 mg/dL (ref 8.9–10.3)
Chloride: 104 mmol/L (ref 98–111)
Creatinine, Ser: 0.69 mg/dL (ref 0.44–1.00)
GFR, Estimated: >60 mL/min (ref >60)
Glucose, Bld: 135 mg/dL — ABNORMAL HIGH (ref 70–99)
Potassium: 4.1 mmol/L (ref 3.5–5.1)
Sodium: 137 mmol/L (ref 135–145)

## 2020-11-12 LAB — BRAIN NATRIURETIC PEPTIDE: B Natriuretic Peptide: 82.4 pg/mL (ref 0.0–100.0)

## 2020-11-12 MED ORDER — FUROSEMIDE 20 MG PO TABS: 20.0000 mg | ORAL_TABLET | Freq: Every day | ORAL | 1 refills | Status: DC | PRN

## 2020-11-12 NOTE — Patient Instructions (Signed)
Lasix (furosemide) 20mg  -- Take 1 tablet daily for the next 3 days then only as needed for weight gain or swelling.   Follow up with Dr. Jacalyn Lefevre office in 2 weeks.

## 2020-11-12 NOTE — Progress Notes (Signed)
Primary Care Physician: [name], PA-C Primary Cardiologist: [name] Primary Electrophysiologist: [name] Referring Physician: [name] is a 75 y.o. female with a history of atrial fibrillation s/p ablation x 2 in [place], HLD, OSA, HTN, DM who presents for follow up in the [facility]. The patient was initially diagnosed with atrial fibrillation in 2019 and had two ablations that year before moving here from [place]. Patient is on Eliquis for a CHADS2VASC score of 7. DCCV was arranged for 02/14/20 but she presented in [place].   On follow up today, patient reports that she has noted swelling in her feet, hands, and face since the end of September. She states she does not cook with salt and tries to limit intake. No afib episodes on ILR. She does have orthopnea at times and raises the head of her bed. Echo 09/07/20 showed normal EF, grade II diastolic dysfunction.   Today, she denies symptoms of chest pain, PND, dizziness, presyncope, syncope, bleeding, or neurologic sequela. The patient is tolerating medications without difficulties and is otherwise without complaint today.    Atrial Fibrillation Risk Factors:  she does have symptoms or diagnosis of sleep apnea. she is compliant with oral device. she does not have a history of rheumatic fever. she does not have a history of alcohol use. The patient does not have a history of early familial atrial fibrillation or other arrhythmias.  she has a BMI of Body mass index is 41.26 kg/m.Marland Kitchen Filed Weights   11/12/20 0928  Weight: 102.3 kg      Family History  Problem Relation Age of Onset   CAD Mother    Thyroid disease Mother    CAD Father    Hypothyroidism Daughter      Atrial Fibrillation Management history:  Previous antiarrhythmic drugs: none Previous cardioversions: none Previous ablations: 2019 x 2 in [place] score: 7 Anticoagulation history:  Eliquis   Past Medical History:  Diagnosis Date   Chronic pain    Depression    Diabetes mellitus without complication (HCC)    GERD (gastroesophageal reflux disease)    History of TIA (transient ischemic attack)    Hyperlipidemia    Hyperopia of both eyes with astigmatism and presbyopia 12/21/2019   Hypertension    Lyme disease    Paroxysmal atrial fibrillation (HCC)    Thyroid disease    Past Surgical History:  Procedure Laterality Date   ATRIAL FIBRILLATION ABLATION     x2 in [place]     implantable loop recorder placement  11/07/2017   MDT LINQ1 implanted in [place] for afib management by Dr Candiss Norse   LUMBAR SPINE SURGERY     TONSILLECTOMY AND ADENOIDECTOMY      Current Outpatient Medications  Medication Sig Dispense Refill   ACCU-CHEK GUIDE test strip TEST 1 - 2 TIMES DAILY 200 strip 0   Accu-Chek Softclix Lancets lancets CHECK BLOOD SUGAR ONE TO TWO TIMES DAILY. 200 each 2   ALPRAZolam (XANAX) 0.25 MG tablet Take 1 tablet (0.25 mg total) by mouth 2 (two) times daily as needed for anxiety. 20 tablet 0   apixaban (ELIQUIS) 5 MG TABS tablet Take 1 tablet (5 mg total) by mouth 2 (two) times daily. 180 tablet 1   aspirin 81 MG chewable tablet Chew 1 tablet (81 mg total) by mouth daily. 30 tablet 0   Blood Glucose  Monitoring Suppl (ACCU-CHEK GUIDE ME) w/Device KIT Test 1-2 times daily 1 kit 0   carvedilol (COREG) 25 MG tablet Take 1 tablet (25 mg total) by mouth 2 (two) times daily with a meal. 180 tablet 3   diltiazem (DILT-XR) 180 MG 24 hr capsule TAKE 1 CAPSULE (180 MG TOTAL) BY MOUTH DAILY. 90 capsule 3   DULoxetine (CYMBALTA) 60 MG capsule Take 1 capsule (60 mg total) by mouth 2 (two) times daily. 180 capsule 1   ezetimibe (ZETIA) 10 MG tablet Take 1 tablet (10 mg total) by mouth daily. 90 tablet 1   gabapentin (NEURONTIN) 300 MG capsule Take 1 capsule (300 mg total) by mouth at bedtime. Take in addition to 800 mg pill at bedtime  for a total of 1100 mg at bedtime. 30 capsule 2   gabapentin (NEURONTIN) 800 MG tablet Take 1 tablet (800 mg total) by mouth 3 (three) times daily. 270 tablet 1   glyBURIDE (DIABETA) 1.25 MG tablet Take 1 tablet (1.25 mg total) by mouth every morning. 90 tablet 3   levothyroxine (SYNTHROID) 75 MCG tablet TAKE 1 TABLET EVERY DAY BEFORE BREAKFAST 90 tablet 3   lisinopril (ZESTRIL) 10 MG tablet Take 1 tablet (10 mg total) by mouth daily. 90 tablet 1   nitroGLYCERIN (NITROSTAT) 0.4 MG SL tablet Place 1 tablet (0.4 mg total) under the tongue every 5 (five) minutes as needed for chest pain. 30 tablet 0   omeprazole (PRILOSEC) 20 MG capsule TAKE 1 CAPSULE EVERY DAY 90 capsule 3   oxybutynin (DITROPAN-XL) 10 MG 24 hr tablet Take 1 tablet (10 mg total) by mouth 2 (two) times daily. 180 tablet 1   pravastatin (PRAVACHOL) 40 MG tablet Take 1 tablet (40 mg total) by mouth every evening. 90 tablet 3   sitaGLIPtin-metformin (JANUMET) 50-1000 MG tablet Take 1 tablet by mouth 2 (two) times daily with a meal.     No current facility-administered medications for this encounter.    Allergies  Allergen Reactions   Baclofen Swelling   Crestor [Rosuvastatin]     Extreme myalgias   Verapamil Swelling   Silicone Itching and Rash    Social History   Socioeconomic History   Marital status: Married    Spouse name: Not on file   Number of children: 5   Years of education: Not on file   Highest education level: Some college, no degree  Occupational History   Not on file  Tobacco Use   Smoking status: Former   Smokeless tobacco: Never   Tobacco comments:    Former smoker 11/12/2020  Substance and Sexual Activity   Alcohol use: Yes    Alcohol/week: 2.0 standard drinks    Types: 2 Standard drinks or equivalent per week    Comment: mix drinks occ.   Drug use: Never   Sexual activity: Not on file  Other Topics Concern   Not on file  Social History Narrative   From [place], married, [place]  Witness, new from [place] to [place]  07/2019   Right handed   Some caffeine use    Lives with husband, daughter, and son-in-law   Social Determinants of Health   Financial Resource Strain: Not on file  Food Insecurity: Not on file  Transportation Needs: Not on file  Physical Activity: Not on file  Stress: Not on file  Social Connections: Not on file  Intimate Partner Violence: Not on file     ROS- All systems are reviewed and negative  except as per the HPI above.  Physical Exam: Vitals:   11/12/20 0928  BP: 122/72  Pulse: 78  Weight: 102.3 kg  Height: _0  (1.575 m)    GEN- The patient is a well appearing obese female, alert and oriented x 3 today.   HEENT-head normocephalic, atraumatic, sclera clear, conjunctiva pink, hearing intact, trachea midline. Lungs- Clear to ausculation bilaterally, normal work of breathing Heart- Regular rate and rhythm, no murmurs, rubs or gallops  GI- soft, NT, ND, + BS Extremities- no clubbing, cyanosis, 1+ bilateral edema, L >R MS- no significant deformity or atrophy Skin- no rash or lesion Psych- euthymic mood, full affect Neuro- strength and sensation are intact   Wt Readings from Last 3 Encounters:  11/12/20 102.3 kg  10/09/20 100.7 kg  09/18/20 96.9 kg    EKG today demonstrates  SR Vent. rate 78 BPM PR interval 170 ms QRS duration 80 ms QT/QTcB 366/417 ms  Echo 09/07/20 demonstrated   1. Left ventricular ejection fraction, by estimation, is 60 to 65%. The left ventricle has normal function. The left ventricle has no regional wall motion abnormalities. Left ventricular diastolic parameters are consistent with Grade II diastolic dysfunction (pseudonormalization).   2. Right ventricular systolic function is normal. The right ventricular  size is normal. There is normal pulmonary artery systolic pressure.   3. Left atrial size was mildly dilated.   4. The mitral valve is normal in structure. No evidence of mitral  valve regurgitation. No evidence of mitral stenosis.   5. The aortic valve is normal in structure. Aortic valve regurgitation is not visualized. Mild aortic valve sclerosis is present, with no evidence of aortic valve stenosis.   6. The inferior vena cava is normal in size with greater than 50%  respiratory variability, suggesting right atrial pressure of 3 mmHg.   Comparison(s): No significant change from prior study. Prior images reviewed side by side.   Epic records are reviewed at length today  CHA2DS2-VASc Score = 8  The patient's score is based upon: CHF History: 1 HTN History: 1 Diabetes History: 1 Stroke History: 2 Vascular Disease History: 1 Age Score: 1 Gender Score: 1      ASSESSMENT AND PLAN: 1. Persistent Atrial Fibrillation (ICD10:  I48.19) The patient's CHA2DS2-VASc score is 8, indicating a 10.8% annual risk of stroke.   S/p afib ablation in [place] x 2 2019. ILR still shows 0% afib burden. Continue Coreg 25 mg BID Continue diltiazem 180 mg daily  Continue Eliquis 5 mg BID  2. Secondary Hypercoagulable State (ICD10:  D68.69) The patient is at significant risk for stroke/thromboembolism based upon her CHA2DS2-VASc Score of 8.  Continue Apixaban (Eliquis).   3. Obesity Body mass index is 41.26 kg/m. Lifestyle modification was discussed and encouraged including regular physical activity and weight reduction.  4. OSA Patient reports compliance with oral device.   5. HTN Stable, no changes today.  6. CAD Calcium score 111 67th percentile No anginal symptoms.  7. Chronic diastolic CHF Her weight is up ~4 lbs with symptoms of lower extremity edema and intermittent orthopnea. Check bmet/CNP Start Lasix 20 mg daily x 3 days, then PRN Does not appears related to afib.   Follow up with [name] or APP in the next few weeks per recall. AF clinic in 6 months.    [facility] [address] [place], [place]  [id number] [phone] 11/12/2020 9:46 AM

## 2020-11-13 ENCOUNTER — Telehealth: Payer: Self-pay | Admitting: Cardiology

## 2020-11-13 ENCOUNTER — Other Ambulatory Visit (HOSPITAL_COMMUNITY): Payer: Self-pay

## 2020-11-13 MED ORDER — APIXABAN 5 MG PO TABS: 5.0000 mg | ORAL_TABLET | Freq: Two times a day (BID) | ORAL | 0 refills | Status: DC

## 2020-11-13 NOTE — Telephone Encounter (Signed)
Pt c/o swelling: STAT is pt has developed SOB within 24 hours  If swelling, where is the swelling located? Feet and calves, left foot is worse. Feet and calves hurt to the touch  How much weight have you gained and in what time span? 9 pounds in 2 weeks   Have you gained 3 pounds in a day or 5 pounds in a week? Not sure  Do you have a log of your daily weights (if so, list)? No, does not have a scale  Are you currently taking a fluid pill? Yes, lasix. Started on it yesterday by Adline Peals  Are you currently SOB? Not right now, but does state that she gets more SOB now, even when she is only walking short distances.  Have you traveled recently? No    Staff message sent to scheduling from Rushie Goltz, RN that per Adline Peals, patient needs to get in within the next 2 weeks to see Dr. Stanford Breed or an APP. The next available I have is 01/02/21 at 10:30am with Jory Sims, pt has been scheduled for this date. She would like to be seen sooner due to her swelling.

## 2020-11-13 NOTE — Telephone Encounter (Signed)
Called patient, advised that she seen the PA yesterday in AFIB clinic- he placed her on lasix 20 mg for 3 days, and then as needed. Patient is aware she should weigh daily while taking this, and then she will call back if no improvement. Patient states she does elevate her legs as well and is hoping this will help. She does notice some redness on the top of her left foot, and they are both painful at the moment. I advised I would route to MD to review visit from yesterday and notes so he could give recommendations if needed. Patient verbalized understanding thankful for call back. She will call and let us know if anything changes.

## 2020-11-14 NOTE — Telephone Encounter (Signed)
Spoke with pt, she has lost 5 lbs in 2 days. Her legs are much better. She will call back if develops any other problems.

## 2020-11-18 ENCOUNTER — Telehealth: Payer: Self-pay | Admitting: *Deleted

## 2020-11-18 NOTE — Telephone Encounter (Signed)
Will route to PharmD for rec's re: holding anticoagulation. Richardson Dopp, PA-C    11/18/2020 3:53 PM

## 2020-11-18 NOTE — Telephone Encounter (Signed)
   [facility] Pre-operative Risk Assessment    Patient Name: RHEALYNN MYHRE  DOB: 27-Mar-1945 MRN: [id number]  HEARTCARE STAFF:  - IMPORTANT!!!!!! Under Visit Info/Reason for Call, type in Other and utilize the format Clearance MM/DD/YY or Clearance TBD. Do not use dashes or single digits. - Please review there is not already an duplicate clearance open for this procedure. - If request is for dental extraction, please clarify the # of teeth to be extracted. - If the patient is currently at the dentist's office, call Pre-Op Callback Staff (MA/nurse) to input urgent request.  - If the patient is not currently in the dentist office, please route to the Pre-Op pool.  Request for surgical clearance:  What type of surgery is being performed?  LUMBAR SPINAL INJECTION  When is this surgery scheduled?  TBD  What type of clearance is required (medical clearance vs. Pharmacy clearance to hold med vs. Both)?  BOTH  Are there any medications that need to be held prior to surgery and how long?  ELIQUIS X'S 3 DAYS  Practice name and name of physician performing surgery?  [facility] / DR. Davy Pique  What is the office phone number?  [phone]   7.   What is the office fax number?  [phone]  8.   Anesthesia type (None, local, MAC, general) ?     Jeanann Lewandowsky 11/18/2020, 3:34 PM  _________________________________________________________________   (provider comments below)

## 2020-11-19 NOTE — Telephone Encounter (Signed)
   Primary Cardiologist: Kirk Ruths, MD  Chart reviewed as part of pre-operative protocol coverage. Given past medical history and time since last visit, based on ACC/AHA guidelines, TING CAGE would be at acceptable risk for the planned procedure without further cardiovascular testing.   Patient with diagnosis of afib on Eliquis for anticoagulation.     Procedure: lumbar spinal injection Date of procedure: TBD   CHA2DS2-VASc Score = 8  This indicates a 10.8% annual risk of stroke. The patient's score is based upon: CHF History: 1 HTN History: 1 Diabetes History: 1 Stroke History: 2 Vascular Disease History: 1 Age Score: 1 Gender Score: 1   CrCl 55mL/min using adjusted body weight due to obesity Platelet count 286K   Her Eliquis may be held for 3 days prior to her procedure.  Please resume as soon as hemostasis is achieved.  I will route this recommendation to the requesting party via Epic fax function and remove from pre-op pool.  Please call with questions.  Jossie Ng. Aayana Reinertsen NP-C    11/19/2020, 2:55 PM [address] Office [phone] Fax [phone]

## 2020-11-19 NOTE — Telephone Encounter (Signed)
Patient with diagnosis of afib on Eliquis for anticoagulation.    Procedure: lumbar spinal injection Date of procedure: TBD  CHA2DS2-VASc Score = 8  This indicates a 10.8% annual risk of stroke. The patient's score is based upon: CHF History: 1 HTN History: 1 Diabetes History: 1 Stroke History: 2 Vascular Disease History: 1 Age Score: 1 Gender Score: 1  CrCl 21mL/min using adjusted body weight due to obesity Platelet count 286K  ESI requires 3 day DOAC hold. Given pt's elevated CV risk, will forward to MD to confirm pt is able to hold Eliquis for 3 days prior to [facility] and resume as soon as safely possible.

## 2020-11-26 ENCOUNTER — Other Ambulatory Visit: Payer: Self-pay

## 2020-11-26 ENCOUNTER — Ambulatory Visit (INDEPENDENT_AMBULATORY_CARE_PROVIDER_SITE_OTHER): Payer: Medicare Other | Admitting: Medical

## 2020-11-26 VITALS — BP 110/60 | HR 80 | Wt 223.6 lb

## 2020-11-26 DIAGNOSIS — I10 Essential (primary) hypertension: Secondary | ICD-10-CM

## 2020-11-26 DIAGNOSIS — I5032 Chronic diastolic (congestive) heart failure: Secondary | ICD-10-CM | POA: Diagnosis not present

## 2020-11-26 DIAGNOSIS — I4819 Other persistent atrial fibrillation: Secondary | ICD-10-CM

## 2020-11-26 DIAGNOSIS — E1169 Type 2 diabetes mellitus with other specified complication: Secondary | ICD-10-CM | POA: Diagnosis not present

## 2020-11-26 DIAGNOSIS — Z8673 Personal history of transient ischemic attack (TIA), and cerebral infarction without residual deficits: Secondary | ICD-10-CM

## 2020-11-26 DIAGNOSIS — I700 Atherosclerosis of aorta: Secondary | ICD-10-CM

## 2020-11-26 DIAGNOSIS — IMO0001 Reserved for inherently not codable concepts without codable children: Secondary | ICD-10-CM

## 2020-11-26 DIAGNOSIS — E1159 Type 2 diabetes mellitus with other circulatory complications: Secondary | ICD-10-CM

## 2020-11-26 DIAGNOSIS — K760 Fatty (change of) liver, not elsewhere classified: Secondary | ICD-10-CM

## 2020-11-26 DIAGNOSIS — Z531 Procedure and treatment not carried out because of patient's decision for reasons of belief and group pressure: Secondary | ICD-10-CM

## 2020-11-26 DIAGNOSIS — E039 Hypothyroidism, unspecified: Secondary | ICD-10-CM

## 2020-11-26 DIAGNOSIS — F321 Major depressive disorder, single episode, moderate: Secondary | ICD-10-CM

## 2020-11-26 DIAGNOSIS — I152 Hypertension secondary to endocrine disorders: Secondary | ICD-10-CM

## 2020-11-26 DIAGNOSIS — G4733 Obstructive sleep apnea (adult) (pediatric): Secondary | ICD-10-CM

## 2020-11-26 DIAGNOSIS — Z7185 Encounter for immunization safety counseling: Secondary | ICD-10-CM

## 2020-11-26 DIAGNOSIS — I25118 Atherosclerotic heart disease of native coronary artery with other forms of angina pectoris: Secondary | ICD-10-CM

## 2020-11-26 NOTE — Patient Instructions (Signed)
Obesity  I spoke [employer].   You may be a candidate for their weigh loss study.  I will send referral.  Expect a call from them Try to do some walking daily for exercise Try to cut out 500 cal in your diet daily.  This could be a dairy portion or a meat portion for example or cut out some carbs such as rice or pasta  Swelling and history of heart failure Since you are seeing some improvement with Lasix with pulsed dosing but we want to also avoid changes in your kidney and potassium levels, lets try doing a lower dose of fluid pill daily Begin taking 1/2 tablet of your Lasix /Furosemide daily or 10 mg daily.  On days that swelling is worse bump up to a whole tablet daily Continue to monitor your weights daily.  A change of 3 or more pounds per day should prompt you to use a whole tablet of Lasix and then call doctor's office about the swelling Continue follow-up with cardiology as planned   Diabetes For now continue your current medication glyburide and Janumet.  If you are able to start a weight loss study you may be put on a different medicine that can help her weight loss and sugar control   Hypothyroidism-continue current medication.  Your thyroid levels have been stable.  I reviewed your normal TSH from 9 months ago   Persistent atrial fibrillation, hypertension-heart rate seems to be in normal rhythm today.  Continue Eliquis anticoagulant, diltiazem, carvedilol, lisinopril  Hyperlipidemia, atherosclerosis-continue Zetia.  You have not tolerated statins prior.  If you would be agreeable to a different medication I would like to try you on a different statin such as Livalo or possibly even doing a cholesterol pill every other day to see if you tolerate this.  Let me know if agreeable.  follow-up with cardiology   Reviewing back through her records, her last colonoscopy was October 2018.  You are up-to-date on this    Vaccines: Shingles vaccine:  I recommend you have a shingles  vaccine to help prevent shingles or herpes zoster outbreak.   Please call your insurer to inquire about coverage for the Shingrix vaccine given in 2 doses.   Some insurers cover this vaccine after age 44, some cover this after age 2.  If your insurer covers this, then call to schedule appointment to have this vaccine here.  Also recommended updated tetanus vaccine  She notes having recent influenza vaccine  Consider COVID booster    I had referred you to [facility] chronic care management.  They have tried to call you a few times and cannot seem to get you on the phone.  You should be getting a call from a pharmacist with chronic care management to review your medicines who may offer questions or recommendations.

## 2020-11-26 NOTE — Addendum Note (Signed)
Addended by: Minette Headland A on: 11/26/2020 10:48 AM   Modules accepted: Orders

## 2020-11-26 NOTE — Progress Notes (Signed)
Subjective: Chief Complaint  Patient presents with   diabetes    Diabetes- having some swelling through out the day- a-fib clinic put on duirtec    Primary Care Provider Chimene Salo, Camelia Eng, PA-C here for primary care  Current Health Care Team: Dentist, Dr. Tyson Dense doctor, Dr. Velta Addison but can't remember name Dr. Blair Hailey, pain management/neurosurgery Dr. Kirk Ruths, cardiology Dr. Renato Shin, endocrinology Dr. Devonne Doughty, ophthalmology   Here for chronic disease follow-up  Mr. Sandra Ruiz is a 75 year old female with a history of chronic diastolic heart failure, chronic anemia, persistent A. 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  Feels like she is starting to swell up.   Been feeling this way the past week.  Saw Afib clinic this week and they looked at her legs.  Afib clinic advised she do fluid pill for 3 days then back off.  However feels like she needs to stay on this.  It helps when she is taking the lasix.  She is not using salt.     Doesn't have a bp machine so not checking BPs at home  She is checking glucose at home.  Typically 120 or lower fasting.  No polyuria or polydipsia.    Has had some vision issues, currently seeing neurology given recent problems. Eye doctor has already evaluated her.  When she swelling does feel more SOB.   Between her back pains and other health issues, sleeps often on right side, not supine.  Bed is elevated up a little.   OSA - uses dental appliance, not tolerated cpap prior  No bleeding but sometimes bruising on forearms.    Still complains about inability to lose weight, wants help in this area.  Has seen commercials about medication injectable to lose weight.   No family history of thyroid cancer.  She does note history of pancreatitis 5  years ago.  This occurred after a colonoscopy.  Has hx/o slush in gall bladder.    She notes compliance with medications  Last visit we referred to [facility] chronic care management but she hasn't answered their phone calls  No other aggravating or relieving factors. No other complaint.     Past Medical History:  Diagnosis Date   Chronic pain    Depression    Diabetes mellitus without complication (HCC)    GERD (gastroesophageal reflux disease)    History of TIA (transient ischemic attack)    Hyperlipidemia    Hyperopia of both eyes with astigmatism and presbyopia 12/21/2019   Hypertension    Lyme disease    Paroxysmal atrial fibrillation (HCC)    Thyroid disease    Current Outpatient Medications on File Prior to Visit  Medication Sig Dispense Refill   ACCU-CHEK GUIDE test strip TEST 1 - 2 TIMES DAILY 200 strip 0   Accu-Chek Softclix Lancets lancets CHECK BLOOD SUGAR ONE TO TWO TIMES DAILY. 200 each 2   ALPRAZolam (XANAX) 0.25 MG tablet Take 1 tablet (0.25 mg total) by mouth 2 (two) times daily as needed for anxiety. 20 tablet 0   apixaban (ELIQUIS) 5 MG TABS tablet Take 1 tablet (5 mg total) by mouth 2 (two) times daily. 14 tablet 0   aspirin 81 MG chewable tablet Chew 1 tablet (81 mg total) by mouth daily. 30 tablet 0   carvedilol (COREG) 25 MG tablet Take 1  tablet (25 mg total) by mouth 2 (two) times daily with a meal. 180 tablet 3   diltiazem (DILT-XR) 180 MG 24 hr capsule TAKE 1 CAPSULE (180 MG TOTAL) BY MOUTH DAILY. 90 capsule 3   DULoxetine (CYMBALTA) 60 MG capsule Take 1 capsule (60 mg total) by mouth 2 (two) times daily. 180 capsule 1   ezetimibe (ZETIA) 10 MG tablet Take 1 tablet (10 mg total) by mouth daily. 90 tablet 1   furosemide (LASIX) 20 MG tablet Take 1 tablet (20 mg total) by mouth daily as needed (swelling or weight gain). 15 tablet 1   gabapentin (NEURONTIN) 800 MG tablet Take 1 tablet (800 mg total) by mouth 3 (three) times daily. 270 tablet 1   glyBURIDE  (DIABETA) 1.25 MG tablet Take 1 tablet (1.25 mg total) by mouth every morning. 90 tablet 3   levothyroxine (SYNTHROID) 75 MCG tablet TAKE 1 TABLET EVERY DAY BEFORE BREAKFAST 90 tablet 3   lisinopril (ZESTRIL) 10 MG tablet Take 1 tablet (10 mg total) by mouth daily. 90 tablet 1   nitroGLYCERIN (NITROSTAT) 0.4 MG SL tablet Place 1 tablet (0.4 mg total) under the tongue every 5 (five) minutes as needed for chest pain. 30 tablet 0   omeprazole (PRILOSEC) 20 MG capsule TAKE 1 CAPSULE EVERY DAY 90 capsule 3   oxybutynin (DITROPAN-XL) 10 MG 24 hr tablet Take 1 tablet (10 mg total) by mouth 2 (two) times daily. 180 tablet 1   sitaGLIPtin-metformin (JANUMET) 50-1000 MG tablet Take 1 tablet by mouth 2 (two) times daily with a meal.     Blood Glucose Monitoring Suppl (ACCU-CHEK GUIDE ME) w/Device KIT Test 1-2 times daily 1 kit 0   pravastatin (PRAVACHOL) 40 MG tablet Take 1 tablet (40 mg total) by mouth every evening. 90 tablet 3   No current facility-administered medications on file prior to visit.   Past Surgical History:  Procedure Laterality Date   ATRIAL FIBRILLATION ABLATION     x2 in [place]     implantable loop recorder placement  11/07/2017   MDT LINQ1 implanted in [place] for afib management by Dr Candiss Norse   LUMBAR SPINE SURGERY     TONSILLECTOMY AND ADENOIDECTOMY       ROS as in subjective     Objective: BP 110/60   Pulse 80   Wt 223 lb 9.6 oz (101.4 kg)   BMI 40.90 kg/m   Wt Readings from Last 3 Encounters:  11/26/20 223 lb 9.6 oz (101.4 kg)  11/12/20 225 lb 9.6 oz (102.3 kg)  10/09/20 222 lb (100.7 kg)   Gen: wd, wn,nad Lungs clear Neck supple, nontender, no enlarged nodes, no JVD or bruit Heart rhythm normal today, normal RRR, no murmurs, normal S1-S2 No  obvious upper or lower extremity edema or other edema Pulses within normal limits     Assessment: Encounter Diagnoses  Name Primary?   Chronic diastolic CHF  (congestive heart failure) (HCC) Yes   Persistent atrial fibrillation ([place])    Type 2 diabetes mellitus with other specified complication, without long-term current use of insulin (HCC)    Depression, major, single episode, moderate (HCC)    Essential hypertension, benign    Fatty liver    History of TIA (transient ischemic attack)    Hypertension associated with diabetes ([place])    Hypothyroidism, unspecified type    OSA (obstructive sleep apnea)    Blood transfusion declined because patient is Jehovah's Witness  Vaccine counseling    Aortic atherosclerosis ([place])    Coronary artery disease of native artery of native heart with stable angina pectoris ([place])       Plan: Obesity  I spoke [facility].   You may be a candidate for their weigh loss study.  I will send referral.  Expect a call from them Try to do some walking daily for exercise Try to cut out 500 cal in your diet daily.  This could be a dairy portion or a meat portion for example or cut out some carbs such as rice or pasta  Swelling and history of heart failure Since you are seeing some improvement with Lasix with pulsed dosing but we want to also avoid changes in your kidney and potassium levels, lets try doing a lower dose of fluid pill daily Begin taking 1/2 tablet of your Lasix /Furosemide daily or 10 mg daily.  On days that swelling is worse bump up to a whole tablet daily Continue to monitor your weights daily.  A change of 3 or more pounds per day should prompt you to use a whole tablet of Lasix and then call doctor's office about the swelling Continue follow-up with cardiology as planned   Diabetes For now continue your current medication glyburide and Janumet.  If you are able to start a weight loss study you may be put on a different medicine that can help her weight loss and sugar control   Hypothyroidism-continue current medication.  Your thyroid levels have been stable.  I reviewed your normal TSH from 9  months ago   Persistent atrial fibrillation, hypertension-heart rate seems to be in normal rhythm today.  Continue Eliquis anticoagulant, diltiazem, carvedilol, lisinopril  Hyperlipidemia, atherosclerosis-continue Zetia.  You have not tolerated statins prior.  If you would be agreeable to a different medication I would like to try you on a different statin such as Livalo or possibly even doing a cholesterol pill every other day to see if you tolerate this.  Let me know if agreeable.  follow-up with cardiology   Reviewing back through her records, her last colonoscopy was October 2018.  You are up-to-date on this    Vaccines: Shingles vaccine:  I recommend you have a shingles vaccine to help prevent shingles or herpes zoster outbreak.   Please call your insurer to inquire about coverage for the Shingrix vaccine given in 2 doses.   Some insurers cover this vaccine after age 80, some cover this after age 76.  If your insurer covers this, then call to schedule appointment to have this vaccine here.  Also recommended updated tetanus vaccine  She notes having recent influenza vaccine  Consider COVID booster    I had referred you to [facility] chronic care management.  They have tried to call you a few times and cannot seem to get you on the phone.  You should be getting a call from a pharmacist with chronic care management to review your medicines who may offer questions or recommendations.    Nuriyah was seen today for diabetes.  Diagnoses and all orders for this visit:  Chronic diastolic CHF (congestive heart failure) (HCC)  Persistent atrial fibrillation ([place])  Type 2 diabetes mellitus with other specified complication, without long-term current use of insulin (HCC) -     Microalbumin/Creatinine Ratio, Urine  Depression, major, single episode, moderate (HCC)  Essential hypertension, benign  Fatty liver  History of TIA (transient ischemic attack)  Hypertension associated with  diabetes ([place])  Hypothyroidism,  unspecified type  OSA (obstructive sleep apnea)  Blood transfusion declined because patient is Jehovah's Witness  Vaccine counseling  Aortic atherosclerosis ([place])  Coronary artery disease of native artery of native heart with stable angina pectoris ([place])   F/u pending call back about weight loss study

## 2020-12-01 ENCOUNTER — Ambulatory Visit (INDEPENDENT_AMBULATORY_CARE_PROVIDER_SITE_OTHER): Payer: Medicare Other

## 2020-12-01 DIAGNOSIS — I4819 Other persistent atrial fibrillation: Secondary | ICD-10-CM | POA: Diagnosis not present

## 2020-12-01 LAB — CUP PACEART REMOTE DEVICE CHECK
Date Time Interrogation Session: 20221128033425
Implantable Pulse Generator Implant Date: 20191104

## 2020-12-04 ENCOUNTER — Telehealth: Payer: Self-pay | Admitting: Medical

## 2020-12-04 NOTE — Telephone Encounter (Signed)
Pt called and states that she was declined by [facility] for Weightloss study. She states that to be approved for that study your [facility] has to be above 7. She states she wants to go in and get her [facility] check again to she if she qualifies. If not, then she states she needs help with this swelling and weight gain she has been experiencing. Please advise pt at [phone].

## 2020-12-05 ENCOUNTER — Other Ambulatory Visit: Payer: Self-pay | Admitting: Medical

## 2020-12-05 MED ORDER — MOUNJARO 2.5 MG/0.5ML ~~LOC~~ SOAJ: 2.5000 mg | SUBCUTANEOUS | 0 refills | Status: DC

## 2020-12-05 NOTE — Telephone Encounter (Signed)
Pt states she would like to try weight loss medication. She would like to try the ozempic. She does not want to do the nutritionist referral yet.

## 2020-12-10 NOTE — Progress Notes (Signed)
[facility] Summary Report / Loop Recorder 

## 2021-01-01 NOTE — Progress Notes (Signed)
Cardiology Clinic Note   Patient Name: Sandra Ruiz Date of Encounter: 01/02/2021  Primary Care Provider:  [name], PA-C Primary Cardiologist:  Kirk Ruths, MD Electrophysiologist: Dr. Rayann Heman   Patient Profile  75 year old female with history of atrial fibrillation status post ablation x2 while living in [place], hyperlipidemia, OSA, hypertension, chronic diastolic CHF with Grade 2 diastolic dysfunction, obestiy and Type 2 diabetes.  Followed in the atrial fibrillation clinic, CHA2DS2-VASc score of 7 on Eliquis, has ILR. Marland Kitchen   Past Medical History    Past Medical History:  Diagnosis Date   Chronic pain    Depression    Diabetes mellitus without complication (HCC)    GERD (gastroesophageal reflux disease)    History of TIA (transient ischemic attack)    Hyperlipidemia    Hyperopia of both eyes with astigmatism and presbyopia 12/21/2019   Hypertension    Lyme disease    Paroxysmal atrial fibrillation (HCC)    Thyroid disease    Past Surgical History:  Procedure Laterality Date   ATRIAL FIBRILLATION ABLATION     x2 in [place]     implantable loop recorder placement  11/07/2017   MDT LINQ1 implanted in [place] for afib management by Dr Candiss Norse   LUMBAR SPINE SURGERY     TONSILLECTOMY AND ADENOIDECTOMY      Allergies  Allergies  Allergen Reactions   Baclofen Swelling   Crestor [Rosuvastatin]     Extreme myalgias   Verapamil Swelling   Silicone Itching and Rash    History of Present Illness    Sandra Ruiz is a 75 year old female we are following for ongoing assessment and management of atrial fibrillation, chronic diastolic heart failure, hypertension, status post lumbar injection after holding Eliquis for 3 days in November 2022.  She is followed in the atrial fibrillation clinic and was last seen there on 11/12/2020 and was doing well.  Blood pressure was well controlled at 122/72 with a pulse of 78.  She comes  today with multiple complaints of a noncardiac etiology.  Most concerning her back, weight gain, and diabetes.  She is medically compliant.  She has had a recent injection of her spine which caused her to feel her heart race and have an increased appetite.  She denies any bleeding, further palpitations, or significant edema.  Home Medications    Current Outpatient Medications  Medication Sig Dispense Refill   ACCU-CHEK GUIDE test strip TEST 1 - 2 TIMES DAILY 200 strip 0   Accu-Chek Softclix Lancets lancets CHECK BLOOD SUGAR ONE TO TWO TIMES DAILY. 200 each 2   apixaban (ELIQUIS) 5 MG TABS tablet Take 1 tablet (5 mg total) by mouth 2 (two) times daily. 14 tablet 0   ASCORBIC ACID PO Take 1 tablet by mouth at bedtime.     aspirin 81 MG chewable tablet Chew 1 tablet (81 mg total) by mouth daily. 30 tablet 0   Blood Glucose Monitoring Suppl (ACCU-CHEK GUIDE ME) w/Device KIT Test 1-2 times daily 1 kit 0   carvedilol (COREG) 25 MG tablet Take 1 tablet (25 mg total) by mouth 2 (two) times daily with a meal. 180 tablet 3   diltiazem (DILT-XR) 180 MG 24 hr capsule TAKE 1 CAPSULE (180 MG TOTAL) BY MOUTH DAILY. 90 capsule 3   DULoxetine (CYMBALTA) 60 MG capsule Take 1 capsule (60 mg total) by mouth 2 (two) times daily. 180 capsule 1   ezetimibe (ZETIA)  10 MG tablet Take 1 tablet (10 mg total) by mouth daily. 90 tablet 1   ferrous sulfate 325 (65 FE) MG tablet Take 325 mg by mouth at bedtime.     gabapentin (NEURONTIN) 800 MG tablet Take 1 tablet (800 mg total) by mouth 3 (three) times daily. 270 tablet 1   glyBURIDE (DIABETA) 1.25 MG tablet Take 1 tablet (1.25 mg total) by mouth every morning. 90 tablet 3   levothyroxine (SYNTHROID) 75 MCG tablet TAKE 1 TABLET EVERY DAY BEFORE BREAKFAST 90 tablet 3   lisinopril (ZESTRIL) 10 MG tablet Take 1 tablet (10 mg total) by mouth daily. 90 tablet 1   nitroGLYCERIN (NITROSTAT) 0.4 MG SL tablet Place 1 tablet (0.4 mg total) under the tongue every 5 (five) minutes as  needed for chest pain. 30 tablet 0   omeprazole (PRILOSEC) 20 MG capsule TAKE 1 CAPSULE EVERY DAY 90 capsule 3   oxybutynin (DITROPAN-XL) 10 MG 24 hr tablet Take 1 tablet (10 mg total) by mouth 2 (two) times daily. 180 tablet 1   ALPRAZolam (XANAX) 0.25 MG tablet Take 1 tablet (0.25 mg total) by mouth 2 (two) times daily as needed for anxiety. (Patient not taking: Reported on 01/02/2021) 20 tablet 0   furosemide (LASIX) 20 MG tablet Take 1 tablet (20 mg total) by mouth daily as needed (swelling or weight gain). (Patient not taking: Reported on 01/02/2021) 15 tablet 1   pravastatin (PRAVACHOL) 40 MG tablet Take 1 tablet (40 mg total) by mouth every evening. 90 tablet 3   sitaGLIPtin-metformin (JANUMET) 50-1000 MG tablet Take 1 tablet by mouth 2 (two) times daily with a meal. (Patient not taking: Reported on 01/02/2021)     tirzepatide [facility]) 2.5 MG/0.5ML Pen Inject 2.5 mg into the skin once a week. (Patient not taking: Reported on 01/02/2021) 6 mL 0   No current facility-administered medications for this visit.     Family History    Family History  Problem Relation Age of Onset   CAD Mother    Thyroid disease Mother    CAD Father    Hypothyroidism Daughter    She indicated that her mother is deceased. She indicated that her father is deceased. She indicated that the status of her daughter is unknown.  Social History    Social History   Socioeconomic History   Marital status: Married    Spouse name: Not on file   Number of children: 5   Years of education: Not on file   Highest education level: Some college, no degree  Occupational History   Not on file  Tobacco Use   Smoking status: Former   Smokeless tobacco: Never   Tobacco comments:    Former smoker 11/12/2020  Substance and Sexual Activity   Alcohol use: Yes    Alcohol/week: 2.0 standard drinks    Types: 2 Standard drinks or equivalent per week    Comment: mix drinks occ.   Drug use: Never   Sexual activity: Not  on file  Other Topics Concern   Not on file  Social History Narrative   From [place], married, [place] Witness, new from [place] to [place]  07/2019   Right handed   Some caffeine use    Lives with husband, daughter, and son-in-law   Social Determinants of Health   Financial Resource Strain: Not on file  Food Insecurity: Not on file  Transportation Needs: Not on file  Physical Activity: Not on file  Stress: Not on  file  Social Connections: Not on file  Intimate Partner Violence: Not on file     Review of Systems    General:  No chills, fever, night sweats or weight changes.  Cardiovascular:  No chest pain, dyspnea on exertion, edema, orthopnea, palpitations, paroxysmal nocturnal dyspnea. Dermatological: No rash, lesions/masses Respiratory: No cough, dyspnea Urologic: No hematuria, dysuria Abdominal:   No nausea, vomiting, diarrhea, bright red blood per rectum, melena, or hematemesis Neurologic:  No visual changes, wkns, changes in mental status. All other systems reviewed and are otherwise negative except as noted above.     Physical Exam    VS:  BP (!) 104/58 (BP Location: Left Arm, Patient Position: Sitting, Cuff Size: Large)    Pulse 79    Ht 5' 1" (1.549 m)    Wt 227 lb 9.6 oz (103.2 kg)    SpO2 96%    BMI 43.00 kg/m  , BMI Body mass index is 43 kg/m.     GEN: Well nourished, well developed, in no acute distress.,  Obese HEENT: normal. Neck: Supple, no JVD, carotid bruits, or masses. Cardiac: RRR, no murmurs, rubs, or gallops. No clubbing, cyanosis, edema.  Radials/DP/PT 2+ and equal bilaterally.  Respiratory:  Respirations regular and unlabored, clear to auscultation bilaterally. GI: Soft, nontender, nondistended, BS + x 4. MS: no deformity or atrophy. Skin: warm and dry, no rash. Neuro:  Strength and sensation are intact. Psych: Normal affect.  Accessory Clinical Findings    ECG personally reviewed by me today-normal sinus rhythm heart  rate of 79 bpm- No acute changes  Lab Results  Component Value Date   WBC 7.7 09/07/2020   HGB 10.2 (L) 09/07/2020   HCT 32.0 (L) 09/07/2020   MCV 88.9 09/07/2020   PLT 286 09/07/2020   Lab Results  Component Value Date   CREATININE 0.69 11/12/2020   BUN 8 11/12/2020   NA 137 11/12/2020   K 4.1 11/12/2020   CL 104 11/12/2020   CO2 25 11/12/2020   Lab Results  Component Value Date   ALT 17 08/26/2020   AST 24 08/26/2020   ALKPHOS 124 (H) 08/26/2020   BILITOT 0.3 08/26/2020   Lab Results  Component Value Date   CHOL 151 05/27/2020   HDL 50 05/27/2020   LDLCALC 87 05/27/2020   TRIG 72 05/27/2020   CHOLHDL 3.0 05/27/2020    Lab Results  Component Value Date   HGBA1C 5.7 (H) 08/26/2020    Review of Prior Studies: Echo 09/07/20 demonstrated   1. Left ventricular ejection fraction, by estimation, is 60 to 65%. The left ventricle has normal function. The left ventricle has no regional wall motion abnormalities. Left ventricular diastolic parameters are consistent with Grade II diastolic dysfunction (pseudonormalization).   2. Right ventricular systolic function is normal. The right ventricular  size is normal. There is normal pulmonary artery systolic pressure.   3. Left atrial size was mildly dilated.   4. The mitral valve is normal in structure. No evidence of mitral valve regurgitation. No evidence of mitral stenosis.   5. The aortic valve is normal in structure. Aortic valve regurgitation is not visualized. Mild aortic valve sclerosis is present, with no evidence of aortic valve stenosis.   6. The inferior vena cava is normal in size with greater than 50%  respiratory variability, suggesting right atrial pressure of 3 mmHg.    CHA2DS2-VASc Score = 8  The patient's score is based upon: CHF History: 1 HTN History: 1 Diabetes History: 1  Stroke History: 2 Vascular Disease History: 1 Age Score: 1 Gender Score: 1  Assessment & Plan   1.  Atrial fibrillation: Status  post ablation x2 while living in [place].  The patient remains in normal sinus rhythm on current medication regimen.  She continues on Eliquis 5 mg twice daily.  May need to consider discontinuing on follow-up if she remains in normal sinus rhythm.  We will continue her on diltiazem.  Aspirin should be discontinued as she is on DOAC.  ILR in situ.   2.  Hyperlipidemia: Remains on pravastatin 40 mg daily and Zetia most recent labs on 05/27/2020 revealed total cholesterol 151 HDL 50 triglycerides 72 LDL of 87.  We will need to redraw on follow-up visit unless completed by PCP to keep LDL less then 70.  3.  Hypertension: Blood pressures currently well controlled, actually low normal at 104/58.  She denies any dizziness, near syncope, or syncopal episodes.  I have advised her if this occurs that she should call our office, especially if blood pressure systolic is less than 149.  We may need to back off on lisinopril, or Lasix, or other med adjustments once patient is evaluated further.  4.  Iron deficiency anemia: Remains on iron replacement therapy.  Followed by PCP.   Current medicines are reviewed at length with the patient today.  I have spent 25 min's  dedicated to the care of this patient on the date of this encounter to include pre-visit review of records, assessment, management and diagnostic testing,with shared decision making.   Signed, Phill Myron. West Pugh, ANP, AACC   01/02/2021 12:41 PM    [facility] [place] [address] Office [phone] Fax [phone]  Notice: This dictation was prepared with Dragon dictation along with smaller phrase technology. Any transcriptional errors that result from this process are unintentional and may not be corrected upon review.

## 2021-01-02 ENCOUNTER — Ambulatory Visit: Payer: Medicare Other | Admitting: Adult Health

## 2021-01-02 ENCOUNTER — Encounter: Payer: Self-pay | Admitting: Adult Health

## 2021-01-02 ENCOUNTER — Other Ambulatory Visit: Payer: Self-pay

## 2021-01-02 VITALS — BP 104/58 | HR 79 | Ht 61.0 in | Wt 227.6 lb

## 2021-01-02 DIAGNOSIS — I480 Paroxysmal atrial fibrillation: Secondary | ICD-10-CM | POA: Diagnosis not present

## 2021-01-02 DIAGNOSIS — D649 Anemia, unspecified: Secondary | ICD-10-CM

## 2021-01-02 DIAGNOSIS — A6920 Lyme disease, unspecified: Secondary | ICD-10-CM | POA: Diagnosis not present

## 2021-01-02 DIAGNOSIS — I25118 Atherosclerotic heart disease of native coronary artery with other forms of angina pectoris: Secondary | ICD-10-CM | POA: Diagnosis not present

## 2021-01-02 DIAGNOSIS — I10 Essential (primary) hypertension: Secondary | ICD-10-CM

## 2021-01-02 NOTE — Patient Instructions (Addendum)
Medication Instructions:  Your Physician recommend you continue on your current medication as directed.    *If you need a refill on your cardiac medications before your next appointment, please call your pharmacy*   Follow-Up: At [facility], you and your health needs are our priority.  As part of our continuing mission to provide you with exceptional heart care, we have created designated Provider Care Teams.  These Care Teams include your primary Cardiologist (physician) and Advanced Practice Providers (APPs -  Physician Assistants and Nurse Practitioners) who all work together to provide you with the care you need, when you need it.  We recommend signing up for the patient portal called "MyChart".  Sign up information is provided on this After Visit Summary.  MyChart is used to connect with patients for Virtual Visits (Telemedicine).  Patients are able to view lab/test results, encounter notes, upcoming appointments, etc.  Non-urgent messages can be sent to your provider as well.   To learn more about what you can do with MyChart, go to [email or web address].    Your next appointment:   July 02, 2021 at 8:30 am  The format for your next appointment:   In Person  Provider:   Kirk Ruths, MD

## 2021-01-04 LAB — CUP PACEART REMOTE DEVICE CHECK
Date Time Interrogation Session: 20221231033900
Implantable Pulse Generator Implant Date: 20191104

## 2021-01-06 ENCOUNTER — Other Ambulatory Visit: Payer: Self-pay | Admitting: Medical

## 2021-01-06 ENCOUNTER — Telehealth: Payer: Self-pay

## 2021-01-06 ENCOUNTER — Ambulatory Visit (INDEPENDENT_AMBULATORY_CARE_PROVIDER_SITE_OTHER): Payer: Medicare Other

## 2021-01-06 DIAGNOSIS — I4819 Other persistent atrial fibrillation: Secondary | ICD-10-CM | POA: Diagnosis not present

## 2021-01-06 DIAGNOSIS — E7800 Pure hypercholesterolemia, unspecified: Secondary | ICD-10-CM

## 2021-01-06 MED ORDER — GABAPENTIN 800 MG PO TABS: 800.0000 mg | ORAL_TABLET | Freq: Three times a day (TID) | ORAL | 1 refills | Status: DC

## 2021-01-06 MED ORDER — PRAVASTATIN SODIUM 40 MG PO TABS: 40.0000 mg | ORAL_TABLET | Freq: Every evening | ORAL | 3 refills | Status: DC

## 2021-01-06 NOTE — Telephone Encounter (Signed)
Received fax from [place] for refill on Pravachol and Gabapentin last apt 11/26/20 and next apt is 02/26/21.

## 2021-01-07 ENCOUNTER — Other Ambulatory Visit: Payer: Self-pay

## 2021-01-07 ENCOUNTER — Ambulatory Visit: Payer: Medicare Other | Admitting: Endocrinology

## 2021-01-07 ENCOUNTER — Encounter: Payer: Self-pay | Admitting: Endocrinology

## 2021-01-07 VITALS — BP 130/64 | HR 81 | Ht 61.0 in | Wt 228.8 lb

## 2021-01-07 DIAGNOSIS — E039 Hypothyroidism, unspecified: Secondary | ICD-10-CM | POA: Diagnosis not present

## 2021-01-07 DIAGNOSIS — E041 Nontoxic single thyroid nodule: Secondary | ICD-10-CM

## 2021-01-07 DIAGNOSIS — E7800 Pure hypercholesterolemia, unspecified: Secondary | ICD-10-CM

## 2021-01-07 LAB — T3, FREE: T3, Free: 3.2 pg/mL (ref 2.3–4.2)

## 2021-01-07 LAB — TSH: TSH: 2.21 u[IU]/mL (ref 0.35–5.50)

## 2021-01-07 LAB — T4, FREE: Free T4: 0.95 ng/dL (ref 0.60–1.60)

## 2021-01-07 MED ORDER — PRAVASTATIN SODIUM 40 MG PO TABS: 40.0000 mg | ORAL_TABLET | Freq: Every evening | ORAL | 1 refills | Status: DC

## 2021-01-07 NOTE — Progress Notes (Signed)
Subjective:    Patient ID: Sandra Ruiz, female    DOB: Dec 17, 1945, 76 y.o.   MRN: [id number]  HPI Pt returns for f/u of MNG (dx'ed 2020; she has had hypothyroidism since 2002; she had bxs in 2020 and 2021 (cat 3--Afirma was low risk).  Main symptom is weight gain.   Past Medical History:  Diagnosis Date   Chronic pain    Depression    Diabetes mellitus without complication (HCC)    GERD (gastroesophageal reflux disease)    History of TIA (transient ischemic attack)    Hyperlipidemia    Hyperopia of both eyes with astigmatism and presbyopia 12/21/2019   Hypertension    Lyme disease    Paroxysmal atrial fibrillation (HCC)    Thyroid disease     Past Surgical History:  Procedure Laterality Date   ATRIAL FIBRILLATION ABLATION     x2 in [place]     implantable loop recorder placement  11/07/2017   MDT [id number] implanted in [place] for afib management by Dr Margaretha Glassing SPINE SURGERY     TONSILLECTOMY AND ADENOIDECTOMY      Social History   Socioeconomic History   Marital status: Married    Spouse name: Not on file   Number of children: 5   Years of education: Not on file   Highest education level: Some college, no degree  Occupational History   Not on file  Tobacco Use   Smoking status: Former   Smokeless tobacco: Never   Tobacco comments:    Former smoker 11/12/2020  Substance and Sexual Activity   Alcohol use: Yes    Alcohol/week: 2.0 standard drinks    Types: 2 Standard drinks or equivalent per week    Comment: mix drinks occ.   Drug use: Never   Sexual activity: Not on file  Other Topics Concern   Not on file  Social History Narrative   From [place], married, Ravia Witness, new from [place] to [place]  07/2019   Right handed   Some caffeine use    Lives with husband, daughter, and son-in-law   Social Determinants of Health   Financial Resource Strain: Not on file  Food Insecurity:  Not on file  Transportation Needs: Not on file  Physical Activity: Not on file  Stress: Not on file  Social Connections: Not on file  Intimate Partner Violence: Not on file    Current Outpatient Medications on File Prior to Visit  Medication Sig Dispense Refill   ACCU-CHEK GUIDE test strip TEST 1 - 2 TIMES DAILY 200 strip 0   Accu-Chek Softclix Lancets lancets CHECK BLOOD SUGAR ONE TO TWO TIMES DAILY. 200 each 2   ALPRAZolam (XANAX) 0.25 MG tablet Take 1 tablet (0.25 mg total) by mouth 2 (two) times daily as needed for anxiety. 20 tablet 0   apixaban (ELIQUIS) 5 MG TABS tablet Take 1 tablet (5 mg total) by mouth 2 (two) times daily. 14 tablet 0   ASCORBIC ACID PO Take 1 tablet by mouth at bedtime.     aspirin 81 MG chewable tablet Chew 1 tablet (81 mg total) by mouth daily. 30 tablet 0   Blood Glucose Monitoring Suppl (ACCU-CHEK GUIDE ME) w/Device KIT Test 1-2 times daily 1 kit 0   carvedilol (COREG) 25 MG tablet Take 1 tablet (25 mg total) by mouth 2 (two) times daily with a meal. 180 tablet 3  diltiazem (DILT-XR) 180 MG 24 hr capsule TAKE 1 CAPSULE (180 MG TOTAL) BY MOUTH DAILY. 90 capsule 3   DULoxetine (CYMBALTA) 60 MG capsule Take 1 capsule (60 mg total) by mouth 2 (two) times daily. 180 capsule 1   ezetimibe (ZETIA) 10 MG tablet Take 1 tablet (10 mg total) by mouth daily. 90 tablet 1   ferrous sulfate 325 (65 FE) MG tablet Take 325 mg by mouth at bedtime.     furosemide (LASIX) 20 MG tablet Take 1 tablet (20 mg total) by mouth daily as needed (swelling or weight gain). 15 tablet 1   gabapentin (NEURONTIN) 800 MG tablet Take 1 tablet (800 mg total) by mouth 3 (three) times daily. 270 tablet 1   glyBURIDE (DIABETA) 1.25 MG tablet Take 1 tablet (1.25 mg total) by mouth every morning. 90 tablet 3   levothyroxine (SYNTHROID) 75 MCG tablet TAKE 1 TABLET EVERY DAY BEFORE BREAKFAST 90 tablet 3   lisinopril (ZESTRIL) 10 MG tablet Take 1 tablet (10 mg total) by mouth daily. 90 tablet 1    nitroGLYCERIN (NITROSTAT) 0.4 MG SL tablet Place 1 tablet (0.4 mg total) under the tongue every 5 (five) minutes as needed for chest pain. 30 tablet 0   omeprazole (PRILOSEC) 20 MG capsule TAKE 1 CAPSULE EVERY DAY 90 capsule 3   oxybutynin (DITROPAN-XL) 10 MG 24 hr tablet Take 1 tablet (10 mg total) by mouth 2 (two) times daily. 180 tablet 1   sitaGLIPtin-metformin (JANUMET) 50-1000 MG tablet Take 1 tablet by mouth 2 (two) times daily with a meal.     tirzepatide (MOUNJARO) 2.5 MG/0.5ML Pen Inject 2.5 mg into the skin once a week. 6 mL 0   No current facility-administered medications on file prior to visit.    Allergies  Allergen Reactions   Baclofen Swelling   Crestor [Rosuvastatin]     Extreme myalgias   Verapamil Swelling   Silicone Itching and Rash    Family History  Problem Relation Age of Onset   CAD Mother    Thyroid disease Mother    CAD Father    Thyroid disease Daughter    Hypothyroidism Daughter     BP 130/64    Pulse 81    Ht 5' 1"  (1.549 m)    Wt 228 lb 12.8 oz (103.8 kg)    SpO2 96%    BMI 43.23 kg/m    Review of Systems     Objective:   Physical Exam VITAL SIGNS:  See vs page.   GENERAL: no distress.   NECK: a healed scar is present (C-spine procedure).  I again cannot appreciate the thyroid nodules.    Lab Results  Component Value Date   TSH 2.21 01/07/2021      Assessment & Plan:  Hypothyroidism: well-controlled.  Please continue the same synthroid MNG: I requested f/u US

## 2021-01-07 NOTE — Patient Instructions (Addendum)
Blood tests are requested for you today.  We'll let you know about the results.  Let's recheck the ultrasound.  you will receive a phone call, about a day and time for an appointment. Please come back for a follow-up appointment in 1 year.    

## 2021-01-08 ENCOUNTER — Ambulatory Visit (HOSPITAL_COMMUNITY): Payer: Medicare HMO | Admitting: Physician Assistant

## 2021-01-12 ENCOUNTER — Encounter (INDEPENDENT_AMBULATORY_CARE_PROVIDER_SITE_OTHER): Payer: Medicare HMO | Admitting: Medical

## 2021-01-14 ENCOUNTER — Ambulatory Visit (HOSPITAL_BASED_OUTPATIENT_CLINIC_OR_DEPARTMENT_OTHER)
Admission: RE | Admit: 2021-01-14 | Discharge: 2021-01-14 | Disposition: A | Discharge status: Home / Self Care | Payer: Medicare Other | Source: Ambulatory Visit | Attending: Endocrinology | Admitting: Endocrinology

## 2021-01-14 ENCOUNTER — Other Ambulatory Visit: Payer: Self-pay

## 2021-01-14 DIAGNOSIS — E041 Nontoxic single thyroid nodule: Secondary | ICD-10-CM | POA: Insufficient documentation

## 2021-01-16 NOTE — Progress Notes (Signed)
[facility] Summary Report / Loop Recorder 

## 2021-01-20 ENCOUNTER — Ambulatory Visit: Payer: Medicare Other | Admitting: Psychiatry

## 2021-01-26 ENCOUNTER — Telehealth: Payer: Self-pay | Admitting: Psychiatry

## 2021-01-26 ENCOUNTER — Ambulatory Visit: Payer: Medicare Other | Admitting: Psychiatry

## 2021-01-26 ENCOUNTER — Encounter: Payer: Self-pay | Admitting: Psychiatry

## 2021-01-26 VITALS — BP 122/70 | HR 68 | Ht 61.0 in | Wt 229.0 lb

## 2021-01-26 DIAGNOSIS — G43109 Migraine with aura, not intractable, without status migrainosus: Secondary | ICD-10-CM | POA: Diagnosis not present

## 2021-01-26 DIAGNOSIS — M5481 Occipital neuralgia: Secondary | ICD-10-CM

## 2021-01-26 MED ORDER — UBRELVY 50 MG PO TABS: 50.0000 mg | ORAL_TABLET | ORAL | 2 refills | Status: DC | PRN

## 2021-01-26 NOTE — Progress Notes (Signed)
° °  CC:  headaches  Follow-up Visit  Last visit: 10/09/20  Brief HPI: 76 year old female with a history of DM2, afib on eliquis, hypothyroidism, HTN, CHF, TIA who follows in clinic for headaches which began following a back surgery in 2010.  At her last visit ESR/CRP and CTH/CTA were ordered. Gabapentin was increased to 800/800/1100.  Interval History: Since her last visit her headaches have worsened. Increased gabapentin made her drowsy so she went back to 800 mg TID. She continues to have daily headaches with throbbing pain as well as stabbing pain from the back of her neck to her eyes. Takes Tylenol 3-4 days out of the week.  Intermittently sees a fuzzy circle in her vision. Sees flashing lights more on her right side. She is due for an eye doctor appointment, is scheduled for March 2023.   ESR/CRP were normal. CT/CTA were not done.  Headache days per month: 30 Headache free days per month: 0  Current Headache Regimen: Preventative: gabapentin 800 mg TID Abortive: tylenol   Prior Therapies                                  Gabapentin 800 mg TID Cymbalta 60 mg daily Carvedilol 25 mg BID Lisinopril 10 mg daily  Physical Exam:   Vital Signs: BP 122/70    Pulse 68    Ht $R'5\' 1"'yY$  (1.549 m)    Wt 229 lb (103.9 kg)    SpO2 97%    BMI 43.27 kg/m  GENERAL:  well appearing, in no acute distress, alert  SKIN:  Color, texture, turgor normal. No rashes or lesions HEAD:  Normocephalic/atraumatic. RESP: normal respiratory effort MSK:  No gross joint deformities.   NEUROLOGICAL: Mental Status: Alert, oriented to person, place and time, Follows commands, and Speech fluent and appropriate. Cranial Nerves: PERRL, face symmetric, no dysarthria, hearing grossly intact Motor: moves all extremities equally Gait: normal-based.  IMPRESSION: 76 year old female with a history of cervical stenosis, DM2, afib on eliquis, hypothyroidism, HTN, CHF, TIA who presents for follow up of headaches. Her  headache pattern is most consistent with chronic migraine. She continues to have daily headaches despite multiple oral preventive medications. Will start Botox for migraine prevention, which may also help with her cervicalgia. Roselyn Meier prescribed for rescue.  PLAN: -Preventive: Start Botox -Rescue: Start Ubrelvy 50 mg PRN -next steps: consider monthly CGRP   Follow-up: for Botox  I spent a total of 39 minutes on the date of the service. Discussed treatment options including preventive and acute medications. Discussed medication overuse headache and to limit use of acute treatments to no more than 2 days/week or 10 days/month. Discussed medication side effects, adverse reactions and drug interactions. Written educational materials and patient instructions outlining all of the above were given.  Genia Harold, MD 01/26/21 4:09 PM

## 2021-01-26 NOTE — Patient Instructions (Signed)
Start Botox. The office will call you to schedule this once it has been approved by your insurance.  Try Sandra Ruiz as needed for headaches. Take one pill at the first sign of headache

## 2021-01-26 NOTE — Telephone Encounter (Signed)
Dr. Billey Gosling would like to start this patient on Botox (CPT [phone], [phone]). Dx: Q42.103. Patient currently has [facility] Medicare.

## 2021-01-29 LAB — HM COLONOSCOPY

## 2021-01-29 NOTE — Telephone Encounter (Signed)
I faxed completed PA form for Botox to [facility].

## 2021-01-29 NOTE — Telephone Encounter (Signed)
Completed [employer] PA form for Botox. Placed in Nurse Pod for MD signature.

## 2021-02-02 ENCOUNTER — Telehealth: Payer: Self-pay | Admitting: Psychiatry

## 2021-02-02 NOTE — Telephone Encounter (Signed)
[employer] (Sandra Ruiz) pt cannot afford the Ubrogepant (UBRELVY) 50 MG TABS and would like switch to a alternative medication, Sumatriptan tablet. Can send medication to  [facility] #[id number].

## 2021-02-02 NOTE — Telephone Encounter (Signed)
Ubrelavy PA, key [id number]. Sent to [employer] [facility].

## 2021-02-03 ENCOUNTER — Other Ambulatory Visit: Payer: Self-pay | Admitting: Psychiatry

## 2021-02-03 NOTE — Telephone Encounter (Signed)
Can we appeal this one? She can't take these preventives because they interact with her current medications:  She is already taking a beta blocker (carvedilol).  She can't take Venlafaxine or amitriptyline because these interact with her Cymbalta.  She can't take candesartan because this interacts with her lisinopril.  Depakote interacts with her apixaban. Topamax interacts with her metformin and glyburide.  Thanks!

## 2021-02-03 NOTE — Telephone Encounter (Signed)
UBRELVY TAB 50MG , use as directed, is approved for non-formulary exception through 01/03/2022 under your Medicare Part D benefit. Approval letter faxed to pharmacy.

## 2021-02-03 NOTE — Telephone Encounter (Signed)
Received denial from patients insurance for Botox. PA was denied because the information provided was not sufficient to support approval for medical necessity. Needs to have tried and failed (after a trial of at least two months) for Amitriptylin, beta blocks, Divalproex, Topiramate, venlafaxine, and Candesartan.

## 2021-02-04 NOTE — Telephone Encounter (Signed)
Completed appeal form and faxed. Awaiting determination.

## 2021-02-04 NOTE — Telephone Encounter (Signed)
Pt needs appeal

## 2021-02-09 ENCOUNTER — Ambulatory Visit (INDEPENDENT_AMBULATORY_CARE_PROVIDER_SITE_OTHER): Payer: Medicare Other

## 2021-02-09 DIAGNOSIS — I4819 Other persistent atrial fibrillation: Secondary | ICD-10-CM

## 2021-02-10 LAB — CUP PACEART REMOTE DEVICE CHECK
Date Time Interrogation Session: 20230205230750
Implantable Pulse Generator Implant Date: 20191104

## 2021-02-12 ENCOUNTER — Telehealth: Payer: Self-pay

## 2021-02-12 NOTE — Telephone Encounter (Signed)
[facility] ILR alert report received. Battery status OK. Normal device function. No new symptom, tachy, brady, or pause episodes. One ongoing new AF episode, on [place] according to previous reports, AF burden is 97.1% of the time.    Spoke with patient, she reports being symptomatic- having shortness of breath especially with exertion and increased fatigue.    Pt confirmed compliance with meds as ordered including: Eliquis, Diltiazem 180mg  Daily and Cervedilol 25mg  BID.  Pt previously treated in AF clinic and would like follow-up to help with this episode.

## 2021-02-12 NOTE — Telephone Encounter (Signed)
Called and left message for patient to call back.

## 2021-02-12 NOTE — Progress Notes (Signed)
[facility] Summary Report / Loop Recorder 

## 2021-02-13 ENCOUNTER — Encounter (HOSPITAL_COMMUNITY): Payer: Self-pay | Admitting: Physician Assistant

## 2021-02-13 ENCOUNTER — Other Ambulatory Visit: Payer: Self-pay

## 2021-02-13 ENCOUNTER — Ambulatory Visit (HOSPITAL_COMMUNITY)
Admission: RE | Admit: 2021-02-13 | Discharge: 2021-02-13 | Disposition: A | Discharge status: Home / Self Care | Payer: Medicare Other | Source: Ambulatory Visit | Attending: Physician Assistant | Admitting: Physician Assistant

## 2021-02-13 VITALS — BP 114/60 | HR 72 | Ht 61.0 in | Wt 228.0 lb

## 2021-02-13 DIAGNOSIS — I4819 Other persistent atrial fibrillation: Secondary | ICD-10-CM | POA: Diagnosis present

## 2021-02-13 DIAGNOSIS — E119 Type 2 diabetes mellitus without complications: Secondary | ICD-10-CM | POA: Insufficient documentation

## 2021-02-13 DIAGNOSIS — Z7182 Exercise counseling: Secondary | ICD-10-CM | POA: Insufficient documentation

## 2021-02-13 DIAGNOSIS — G4733 Obstructive sleep apnea (adult) (pediatric): Secondary | ICD-10-CM | POA: Diagnosis not present

## 2021-02-13 DIAGNOSIS — Z8673 Personal history of transient ischemic attack (TIA), and cerebral infarction without residual deficits: Secondary | ICD-10-CM | POA: Diagnosis not present

## 2021-02-13 DIAGNOSIS — I5032 Chronic diastolic (congestive) heart failure: Secondary | ICD-10-CM | POA: Insufficient documentation

## 2021-02-13 DIAGNOSIS — E669 Obesity, unspecified: Secondary | ICD-10-CM | POA: Insufficient documentation

## 2021-02-13 DIAGNOSIS — Z7901 Long term (current) use of anticoagulants: Secondary | ICD-10-CM | POA: Diagnosis not present

## 2021-02-13 DIAGNOSIS — Z79899 Other long term (current) drug therapy: Secondary | ICD-10-CM | POA: Diagnosis not present

## 2021-02-13 DIAGNOSIS — Z6841 Body Mass Index (BMI) 40.0 and over, adult: Secondary | ICD-10-CM | POA: Diagnosis not present

## 2021-02-13 DIAGNOSIS — D6869 Other thrombophilia: Secondary | ICD-10-CM | POA: Diagnosis not present

## 2021-02-13 DIAGNOSIS — E785 Hyperlipidemia, unspecified: Secondary | ICD-10-CM | POA: Diagnosis not present

## 2021-02-13 DIAGNOSIS — I2510 Atherosclerotic heart disease of native coronary artery without angina pectoris: Secondary | ICD-10-CM | POA: Diagnosis not present

## 2021-02-13 DIAGNOSIS — I110 Hypertensive heart disease with heart failure: Secondary | ICD-10-CM | POA: Insufficient documentation

## 2021-02-13 NOTE — Patient Instructions (Signed)
Cardioversion scheduled for Thursday, March 2nd  -Come to clinic for labs at [facility] at the [facility] and go to admitting at 930am  - Do not eat or drink anything after midnight the night prior to your procedure.  - Take all your morning medication (except diabetic medications) with a sip of water prior to arrival.  - You will not be able to drive home after your procedure.  - Do NOT miss any doses of your blood thinner - if you should miss a dose please notify our office immediately.  - If you feel as if you go back into normal rhythm prior to scheduled cardioversion, please notify our office immediately. If your procedure is canceled in the cardioversion suite you will be charged a cancellation fee.

## 2021-02-13 NOTE — Progress Notes (Signed)
Primary Care Physician: [name], PA-C Primary Cardiologist: [name] Primary Electrophysiologist: [name] Referring Physician: [name] is a 76 y.o. female with a history of atrial fibrillation s/p ablation x 2 in [place], HLD, OSA, HTN, DM who presents for follow up in the [facility]. The patient was initially diagnosed with atrial fibrillation in 2019 and had two ablations that year before moving here from [place]. Patient is on Eliquis for a CHADS2VASC score of 7. DCCV was arranged for 02/14/20 but she presented in [place].   On follow up today, the device clinic received an alert for an ongoing afib episode starting 01/31/21. She has noted more fatigue and SOB since that time. There were no specific triggers that she could identify. She denies any missed doses of anticoagulation. She does have numerous orthopedic complaints as well. She is followed by pain management.   Today, she denies symptoms of palpitations, chest pain, PND, dizziness, presyncope, syncope, bleeding, or neurologic sequela. The patient is tolerating medications without difficulties and is otherwise without complaint today.    Atrial Fibrillation Risk Factors:  she does have symptoms or diagnosis of sleep apnea. she is compliant with oral device. she does not have a history of rheumatic fever. she does not have a history of alcohol use. The patient does not have a history of early familial atrial fibrillation or other arrhythmias.  she has a BMI of Body mass index is 43.08 kg/m.Marland Kitchen Filed Weights   02/13/21 1127  Weight: 103.4 kg     Family History  Problem Relation Age of Onset   CAD Mother    Thyroid disease Mother    CAD Father    Thyroid disease Daughter    Hypothyroidism Daughter      Atrial Fibrillation Management history:  Previous antiarrhythmic drugs: none Previous cardioversions: none Previous ablations: 2019 x 2 in [place] score: 7 Anticoagulation history: Eliquis   Past Medical History:  Diagnosis Date   Chronic pain    Depression    Diabetes mellitus without complication (HCC)    GERD (gastroesophageal reflux disease)    History of TIA (transient ischemic attack)    Hyperlipidemia    Hyperopia of both eyes with astigmatism and presbyopia 12/21/2019   Hypertension    Lyme disease    Paroxysmal atrial fibrillation (HCC)    Thyroid disease    Past Surgical History:  Procedure Laterality Date   ATRIAL FIBRILLATION ABLATION     x2 in [place]     implantable loop recorder placement  11/07/2017   MDT LINQ1 implanted in [place] for afib management by Dr Candiss Norse   LUMBAR SPINE SURGERY     TONSILLECTOMY AND ADENOIDECTOMY      Current Outpatient Medications  Medication Sig Dispense Refill   ACCU-CHEK GUIDE test strip TEST 1 - 2 TIMES DAILY 200 strip 0   Accu-Chek Softclix Lancets lancets CHECK BLOOD SUGAR ONE TO TWO TIMES DAILY. 200 each 2   ALPRAZolam (XANAX) 0.25 MG tablet Take 1 tablet (0.25 mg total) by mouth 2 (two) times daily as needed for anxiety. 20 tablet 0   apixaban (ELIQUIS) 5 MG TABS tablet Take 1 tablet (5 mg total) by mouth 2 (two) times daily. 14 tablet 0   ASCORBIC ACID PO Take 1 tablet by mouth at bedtime.     Blood Glucose Monitoring Suppl (ACCU-CHEK GUIDE  ME) w/Device KIT Test 1-2 times daily 1 kit 0   carvedilol (COREG) 25 MG tablet Take 1 tablet (25 mg total) by mouth 2 (two) times daily with a meal. 180 tablet 3   diltiazem (DILT-XR) 180 MG 24 hr capsule TAKE 1 CAPSULE (180 MG TOTAL) BY MOUTH DAILY. 90 capsule 3   DULoxetine (CYMBALTA) 60 MG capsule Take 1 capsule (60 mg total) by mouth 2 (two) times daily. 180 capsule 1   ezetimibe (ZETIA) 10 MG tablet Take 1 tablet (10 mg total) by mouth daily. 90 tablet 1   ferrous sulfate 325 (65 FE) MG tablet Take 325 mg by mouth at bedtime.     furosemide (LASIX) 20 MG tablet Take  1 tablet (20 mg total) by mouth daily as needed (swelling or weight gain). 15 tablet 1   gabapentin (NEURONTIN) 800 MG tablet Take 1 tablet (800 mg total) by mouth 3 (three) times daily. 270 tablet 1   glyBURIDE (DIABETA) 1.25 MG tablet Take 1 tablet (1.25 mg total) by mouth every morning. 90 tablet 3   levothyroxine (SYNTHROID) 75 MCG tablet TAKE 1 TABLET EVERY DAY BEFORE BREAKFAST 90 tablet 3   lisinopril (ZESTRIL) 10 MG tablet Take 1 tablet (10 mg total) by mouth daily. 90 tablet 1   nitroGLYCERIN (NITROSTAT) 0.4 MG SL tablet Place 1 tablet (0.4 mg total) under the tongue every 5 (five) minutes as needed for chest pain. 30 tablet 0   omeprazole (PRILOSEC) 20 MG capsule TAKE 1 CAPSULE EVERY DAY 90 capsule 3   oxybutynin (DITROPAN-XL) 10 MG 24 hr tablet Take 1 tablet (10 mg total) by mouth 2 (two) times daily. 180 tablet 1   pravastatin (PRAVACHOL) 40 MG tablet Take 1 tablet (40 mg total) by mouth every evening. 90 tablet 1   tirzepatide (MOUNJARO) 2.5 MG/0.5ML Pen Inject 2.5 mg into the skin once a week. 6 mL 0   Ubrogepant (UBRELVY) 50 MG TABS Take 50 mg by mouth as needed (migraine). Take one pill at the first sign of migraine. Max dose 50 mg in 24 hours 16 tablet 2   No current facility-administered medications for this encounter.    Allergies  Allergen Reactions   Baclofen Swelling   Crestor [Rosuvastatin]     Extreme myalgias   Verapamil Swelling   Silicone Itching and Rash    Social History   Socioeconomic History   Marital status: Married    Spouse name: Not on file   Number of children: 5   Years of education: Not on file   Highest education level: Some college, no degree  Occupational History   Not on file  Tobacco Use   Smoking status: Former   Smokeless tobacco: Never   Tobacco comments:    Former smoker 11/12/2020  Substance and Sexual Activity   Alcohol use: Yes    Alcohol/week: 2.0 standard drinks    Types: 2 Standard drinks or equivalent per week     Comment: mix drinks occ.   Drug use: Never   Sexual activity: Not on file  Other Topics Concern   Not on file  Social History Narrative   From [place], married, [place] Witness, new from [place] to [place]  07/2019   Right handed   Some caffeine use    Lives with husband, daughter, and son-in-law   Social Determinants of Health   Financial Resource Strain: Not on file  Food Insecurity: Not on file  Transportation Needs: Not on file  Physical Activity: Not on file  Stress: Not on file  Social Connections: Not on file  Intimate Partner Violence: Not on file     ROS- All systems are reviewed and negative except as per the HPI above.  Physical Exam: Vitals:   02/13/21 1127  BP: 114/60  Pulse: 72  Weight: 103.4 kg  Height: _0  (1.549 m)    GEN- The patient is a well appearing obese female, alert and oriented x 3 today.   HEENT-head normocephalic, atraumatic, sclera clear, conjunctiva pink, hearing intact, trachea midline. Lungs- Clear to ausculation bilaterally, normal work of breathing Heart- irregular rate and rhythm, no murmurs, rubs or gallops  GI- soft, NT, ND, + BS Extremities- no clubbing, cyanosis, or edema MS- no significant deformity or atrophy Skin- no rash or lesion Psych- euthymic mood, full affect Neuro- strength and sensation are intact   Wt Readings from Last 3 Encounters:  02/13/21 103.4 kg  01/26/21 103.9 kg  01/07/21 103.8 kg    EKG today demonstrates  Afib Vent. rate 72 BPM PR interval * ms QRS duration 84 ms QT/QTcB 382/418 ms  Echo 09/07/20 demonstrated   1. Left ventricular ejection fraction, by estimation, is 60 to 65%. The left ventricle has normal function. The left ventricle has no regional wall motion abnormalities. Left ventricular diastolic parameters are consistent with Grade II diastolic dysfunction (pseudonormalization).   2. Right ventricular systolic function is normal. The right ventricular  size is  normal. There is normal pulmonary artery systolic pressure.   3. Left atrial size was mildly dilated.   4. The mitral valve is normal in structure. No evidence of mitral valve regurgitation. No evidence of mitral stenosis.   5. The aortic valve is normal in structure. Aortic valve regurgitation is not visualized. Mild aortic valve sclerosis is present, with no evidence of aortic valve stenosis.   6. The inferior vena cava is normal in size with greater than 50%  respiratory variability, suggesting right atrial pressure of 3 mmHg.   Comparison(s): No significant change from prior study. Prior images reviewed side by side.   Epic records are reviewed at length today  CHA2DS2-VASc Score = 8  The patient's score is based upon: CHF History: 1 HTN History: 1 Diabetes History: 1 Stroke History: 2 Vascular Disease History: 1 Age Score: 1 Gender Score: 1       ASSESSMENT AND PLAN: 1. Persistent Atrial Fibrillation (ICD10:  I48.19) The patient's CHA2DS2-VASc score is 8, indicating a 10.8% annual risk of stroke.   S/p afib ablation in [place] x 2 2019. Patient in persistent symptomatic afib, ILR shows persistent afib since 01/31/21. We discussed rhythm control options. Will plan for DCCV.  Continue Coreg 25 mg BID Continue diltiazem 180 mg daily  Continue Eliquis 5 mg BID  2. Secondary Hypercoagulable State (ICD10:  D68.69) The patient is at significant risk for stroke/thromboembolism based upon her CHA2DS2-VASc Score of 8.  Continue Apixaban (Eliquis).   3. Obesity Body mass index is 43.08 kg/m. Lifestyle modification was discussed and encouraged including regular physical activity and weight reduction. Will refer to [place]  4. OSA Patient reports compliance with oral device.  5. HTN Stable, no changes today.  6. CAD Calcium score 111 67th percentile No anginal symptoms.  7. Chronic diastolic CHF Her weight is stable, appears euvolemic today.    Follow up in the AF  clinic post DCCV.    [facility] [address]  [place], [place] [address] [phone] 02/13/2021 11:32 AM

## 2021-02-13 NOTE — H&P (View-Only) (Signed)
Primary Care Physician: [name], PA-C Primary Cardiologist: [name] Primary Electrophysiologist: [name] Referring Physician: [name] is a 76 y.o. female with a history of atrial fibrillation s/p ablation x 2 in [place], HLD, OSA, HTN, DM who presents for follow up in the [facility]. The patient was initially diagnosed with atrial fibrillation in 2019 and had two ablations that year before moving here from [place]. Patient is on Eliquis for a CHADS2VASC score of 7. DCCV was arranged for 02/14/20 but she presented in [place].   On follow up today, the device clinic received an alert for an ongoing afib episode starting 01/31/21. She has noted more fatigue and SOB since that time. There were no specific triggers that she could identify. She denies any missed doses of anticoagulation. She does have numerous orthopedic complaints as well. She is followed by pain management.   Today, she denies symptoms of palpitations, chest pain, PND, dizziness, presyncope, syncope, bleeding, or neurologic sequela. The patient is tolerating medications without difficulties and is otherwise without complaint today.    Atrial Fibrillation Risk Factors:  she does have symptoms or diagnosis of sleep apnea. she is compliant with oral device. she does not have a history of rheumatic fever. she does not have a history of alcohol use. The patient does not have a history of early familial atrial fibrillation or other arrhythmias.  she has a BMI of Body mass index is 43.08 kg/m.Marland Kitchen Filed Weights   02/13/21 1127  Weight: 103.4 kg     Family History  Problem Relation Age of Onset   CAD Mother    Thyroid disease Mother    CAD Father    Thyroid disease Daughter    Hypothyroidism Daughter      Atrial Fibrillation Management history:  Previous antiarrhythmic drugs: none Previous cardioversions: none Previous ablations: 2019 x 2 in [place] score: 7 Anticoagulation history: Eliquis   Past Medical History:  Diagnosis Date   Chronic pain    Depression    Diabetes mellitus without complication (HCC)    GERD (gastroesophageal reflux disease)    History of TIA (transient ischemic attack)    Hyperlipidemia    Hyperopia of both eyes with astigmatism and presbyopia 12/21/2019   Hypertension    Lyme disease    Paroxysmal atrial fibrillation (HCC)    Thyroid disease    Past Surgical History:  Procedure Laterality Date   ATRIAL FIBRILLATION ABLATION     x2 in [place]     implantable loop recorder placement  11/07/2017   MDT LINQ1 implanted in [place] for afib management by Dr Candiss Norse   LUMBAR SPINE SURGERY     TONSILLECTOMY AND ADENOIDECTOMY      Current Outpatient Medications  Medication Sig Dispense Refill   ACCU-CHEK GUIDE test strip TEST 1 - 2 TIMES DAILY 200 strip 0   Accu-Chek Softclix Lancets lancets CHECK BLOOD SUGAR ONE TO TWO TIMES DAILY. 200 each 2   ALPRAZolam (XANAX) 0.25 MG tablet Take 1 tablet (0.25 mg total) by mouth 2 (two) times daily as needed for anxiety. 20 tablet 0   apixaban (ELIQUIS) 5 MG TABS tablet Take 1 tablet (5 mg total) by mouth 2 (two) times daily. 14 tablet 0   ASCORBIC ACID PO Take 1 tablet by mouth at bedtime.     Blood Glucose Monitoring Suppl (ACCU-CHEK GUIDE  ME) w/Device KIT Test 1-2 times daily 1 kit 0   carvedilol (COREG) 25 MG tablet Take 1 tablet (25 mg total) by mouth 2 (two) times daily with a meal. 180 tablet 3   diltiazem (DILT-XR) 180 MG 24 hr capsule TAKE 1 CAPSULE (180 MG TOTAL) BY MOUTH DAILY. 90 capsule 3   DULoxetine (CYMBALTA) 60 MG capsule Take 1 capsule (60 mg total) by mouth 2 (two) times daily. 180 capsule 1   ezetimibe (ZETIA) 10 MG tablet Take 1 tablet (10 mg total) by mouth daily. 90 tablet 1   ferrous sulfate 325 (65 FE) MG tablet Take 325 mg by mouth at bedtime.     furosemide (LASIX) 20 MG tablet Take  1 tablet (20 mg total) by mouth daily as needed (swelling or weight gain). 15 tablet 1   gabapentin (NEURONTIN) 800 MG tablet Take 1 tablet (800 mg total) by mouth 3 (three) times daily. 270 tablet 1   glyBURIDE (DIABETA) 1.25 MG tablet Take 1 tablet (1.25 mg total) by mouth every morning. 90 tablet 3   levothyroxine (SYNTHROID) 75 MCG tablet TAKE 1 TABLET EVERY DAY BEFORE BREAKFAST 90 tablet 3   lisinopril (ZESTRIL) 10 MG tablet Take 1 tablet (10 mg total) by mouth daily. 90 tablet 1   nitroGLYCERIN (NITROSTAT) 0.4 MG SL tablet Place 1 tablet (0.4 mg total) under the tongue every 5 (five) minutes as needed for chest pain. 30 tablet 0   omeprazole (PRILOSEC) 20 MG capsule TAKE 1 CAPSULE EVERY DAY 90 capsule 3   oxybutynin (DITROPAN-XL) 10 MG 24 hr tablet Take 1 tablet (10 mg total) by mouth 2 (two) times daily. 180 tablet 1   pravastatin (PRAVACHOL) 40 MG tablet Take 1 tablet (40 mg total) by mouth every evening. 90 tablet 1   tirzepatide (MOUNJARO) 2.5 MG/0.5ML Pen Inject 2.5 mg into the skin once a week. 6 mL 0   Ubrogepant (UBRELVY) 50 MG TABS Take 50 mg by mouth as needed (migraine). Take one pill at the first sign of migraine. Max dose 50 mg in 24 hours 16 tablet 2   No current facility-administered medications for this encounter.    Allergies  Allergen Reactions   Baclofen Swelling   Crestor [Rosuvastatin]     Extreme myalgias   Verapamil Swelling   Silicone Itching and Rash    Social History   Socioeconomic History   Marital status: Married    Spouse name: Not on file   Number of children: 5   Years of education: Not on file   Highest education level: Some college, no degree  Occupational History   Not on file  Tobacco Use   Smoking status: Former   Smokeless tobacco: Never   Tobacco comments:    Former smoker 11/12/2020  Substance and Sexual Activity   Alcohol use: Yes    Alcohol/week: 2.0 standard drinks    Types: 2 Standard drinks or equivalent per week     Comment: mix drinks occ.   Drug use: Never   Sexual activity: Not on file  Other Topics Concern   Not on file  Social History Narrative   From [place], married, [place] [employer], new from [place] to [place]  07/2019   Right handed   Some caffeine use    Lives with husband, daughter, and son-in-law   Social Determinants of Health   Financial Resource Strain: Not on file  Food Insecurity: Not on file  Transportation Needs: Not on file  Physical Activity: Not on file  Stress: Not on file  Social Connections: Not on file  Intimate Partner Violence: Not on file     ROS- All systems are reviewed and negative except as per the HPI above.  Physical Exam: Vitals:   02/13/21 1127  BP: 114/60  Pulse: 72  Weight: 103.4 kg  Height: _0  (1.549 m)    GEN- The patient is a well appearing obese female, alert and oriented x 3 today.   HEENT-head normocephalic, atraumatic, sclera clear, conjunctiva pink, hearing intact, trachea midline. Lungs- Clear to ausculation bilaterally, normal work of breathing Heart- irregular rate and rhythm, no murmurs, rubs or gallops  GI- soft, NT, ND, + BS Extremities- no clubbing, cyanosis, or edema MS- no significant deformity or atrophy Skin- no rash or lesion Psych- euthymic mood, full affect Neuro- strength and sensation are intact   Wt Readings from Last 3 Encounters:  02/13/21 103.4 kg  01/26/21 103.9 kg  01/07/21 103.8 kg    EKG today demonstrates  Afib Vent. rate 72 BPM PR interval * ms QRS duration 84 ms QT/QTcB 382/418 ms  Echo 09/07/20 demonstrated   1. Left ventricular ejection fraction, by estimation, is 60 to 65%. The left ventricle has normal function. The left ventricle has no regional wall motion abnormalities. Left ventricular diastolic parameters are consistent with Grade II diastolic dysfunction (pseudonormalization).   2. Right ventricular systolic function is normal. The right ventricular  size is  normal. There is normal pulmonary artery systolic pressure.   3. Left atrial size was mildly dilated.   4. The mitral valve is normal in structure. No evidence of mitral valve regurgitation. No evidence of mitral stenosis.   5. The aortic valve is normal in structure. Aortic valve regurgitation is not visualized. Mild aortic valve sclerosis is present, with no evidence of aortic valve stenosis.   6. The inferior vena cava is normal in size with greater than 50%  respiratory variability, suggesting right atrial pressure of 3 mmHg.   Comparison(s): No significant change from prior study. Prior images reviewed side by side.   Epic records are reviewed at length today  CHA2DS2-VASc Score = 8  The patient's score is based upon: CHF History: 1 HTN History: 1 Diabetes History: 1 Stroke History: 2 Vascular Disease History: 1 Age Score: 1 Gender Score: 1       ASSESSMENT AND PLAN: 1. Persistent Atrial Fibrillation (ICD10:  I48.19) The patient's CHA2DS2-VASc score is 8, indicating a 10.8% annual risk of stroke.   S/p afib ablation in [place] x 2 2019. Patient in persistent symptomatic afib, ILR shows persistent afib since 01/31/21. We discussed rhythm control options. Will plan for DCCV.  Continue Coreg 25 mg BID Continue diltiazem 180 mg daily  Continue Eliquis 5 mg BID  2. Secondary Hypercoagulable State (ICD10:  D68.69) The patient is at significant risk for stroke/thromboembolism based upon her CHA2DS2-VASc Score of 8.  Continue Apixaban (Eliquis).   3. Obesity Body mass index is 43.08 kg/m. Lifestyle modification was discussed and encouraged including regular physical activity and weight reduction. Will refer to [place]  4. OSA Patient reports compliance with oral device.  5. HTN Stable, no changes today.  6. CAD Calcium score 111 67th percentile No anginal symptoms.  7. Chronic diastolic CHF Her weight is stable, appears euvolemic today.    Follow up in the AF  clinic post DCCV.    [facility] [address]  [place], [place] [address] [phone] 02/13/2021 11:32 AM

## 2021-02-16 NOTE — Progress Notes (Signed)
error 

## 2021-02-17 ENCOUNTER — Encounter: Payer: Self-pay | Admitting: Medical

## 2021-02-25 ENCOUNTER — Encounter (HOSPITAL_COMMUNITY): Payer: Self-pay | Admitting: Cardiology

## 2021-02-25 ENCOUNTER — Ambulatory Visit: Payer: Medicare Other | Admitting: Endocrinology

## 2021-02-25 NOTE — Progress Notes (Signed)
Attempted to obtain medical history via telephone, unable to reach at this time. I left a voicemail to return pre surgical testing department's phone call.  

## 2021-02-26 ENCOUNTER — Ambulatory Visit: Payer: Medicare Other | Admitting: Medical

## 2021-02-26 ENCOUNTER — Ambulatory Visit (INDEPENDENT_AMBULATORY_CARE_PROVIDER_SITE_OTHER): Payer: Medicare Other | Admitting: Medical

## 2021-02-26 ENCOUNTER — Other Ambulatory Visit: Payer: Self-pay

## 2021-02-26 ENCOUNTER — Encounter: Payer: Self-pay | Admitting: Medical

## 2021-02-26 VITALS — BP 122/72 | HR 81 | Temp 96.8°F | Wt 229.4 lb

## 2021-02-26 DIAGNOSIS — E1169 Type 2 diabetes mellitus with other specified complication: Secondary | ICD-10-CM | POA: Diagnosis not present

## 2021-02-26 DIAGNOSIS — R609 Edema, unspecified: Secondary | ICD-10-CM | POA: Diagnosis not present

## 2021-02-26 DIAGNOSIS — I4819 Other persistent atrial fibrillation: Secondary | ICD-10-CM

## 2021-02-26 DIAGNOSIS — I5032 Chronic diastolic (congestive) heart failure: Secondary | ICD-10-CM

## 2021-02-26 DIAGNOSIS — M549 Dorsalgia, unspecified: Secondary | ICD-10-CM

## 2021-02-26 DIAGNOSIS — K5909 Other constipation: Secondary | ICD-10-CM

## 2021-02-26 DIAGNOSIS — E039 Hypothyroidism, unspecified: Secondary | ICD-10-CM

## 2021-02-26 DIAGNOSIS — G8929 Other chronic pain: Secondary | ICD-10-CM

## 2021-02-26 MED ORDER — TRULANCE 3 MG PO TABS: 1.0000 | ORAL_TABLET | Freq: Every day | ORAL | 0 refills | Status: DC

## 2021-02-26 NOTE — Progress Notes (Signed)
Subjective:  Sandra Ruiz is a 76 y.o. female who presents for Chief Complaint  Patient presents with   Diabetes    Follow up     Here for med check.  She recently saw cardiology has been in persistent A-fib.  She is supposed to have cardioversion on March 2.  Diabetes - couldn't tolerate metformin and not wiling to take this. Just started [facility] recently.  Was on glipizide but this was discontinued last visit when we started [facility].  No recent polyuria, polydipsia, or blurred vision  She takes Lasix on average 2 times per week but if she does not take it she gets swelling.  She wants some clarification on if she can take this daily or not  She is not currently taking iron  She is still having ongoing back pains which limits her exercise.  She is supposed to have recheck with neurosurgery, Dr. Davy Pique on March 6  She sees eye doctor on March 10  She has chronic problems with bowels, chronic constipation.  In the past she has tried Amitiza without improvement.  She is not sure about Linzess.  MiraLAX does not work.  No blood in stool.  She notes she is on a lot of medication but when asked about each individual medicine she notes that she needs to stay on oxybutynin otherwise without it would be having urinary leakage all the time, overactive bladder.  No other aggravating or relieving factors.    No other c/o.  Past Medical History:  Diagnosis Date   Chronic pain    Depression    Diabetes mellitus without complication (HCC)    GERD (gastroesophageal reflux disease)    History of TIA (transient ischemic attack)    Hyperlipidemia    Hyperopia of both eyes with astigmatism and presbyopia 12/21/2019   Hypertension    Lyme disease    Paroxysmal atrial fibrillation (HCC)    Thyroid disease    Current Outpatient Medications on File Prior to Visit  Medication Sig Dispense Refill   ACCU-CHEK GUIDE test strip TEST 1 - 2 TIMES DAILY 200 strip 0   Accu-Chek Softclix Lancets  lancets CHECK BLOOD SUGAR ONE TO TWO TIMES DAILY. 200 each 2   ALPRAZolam (XANAX) 0.25 MG tablet Take 1 tablet (0.25 mg total) by mouth 2 (two) times daily as needed for anxiety. 20 tablet 0   apixaban (ELIQUIS) 5 MG TABS tablet Take 1 tablet (5 mg total) by mouth 2 (two) times daily. 14 tablet 0   Blood Glucose Monitoring Suppl (ACCU-CHEK GUIDE ME) w/Device KIT Test 1-2 times daily 1 kit 0   carvedilol (COREG) 25 MG tablet Take 1 tablet (25 mg total) by mouth 2 (two) times daily with a meal. 180 tablet 3   diltiazem (DILT-XR) 180 MG 24 hr capsule TAKE 1 CAPSULE (180 MG TOTAL) BY MOUTH DAILY. 90 capsule 3   DULoxetine (CYMBALTA) 60 MG capsule Take 1 capsule (60 mg total) by mouth 2 (two) times daily. 180 capsule 1   ezetimibe (ZETIA) 10 MG tablet Take 1 tablet (10 mg total) by mouth daily. 90 tablet 1   furosemide (LASIX) 20 MG tablet Take 1 tablet (20 mg total) by mouth daily as needed (swelling or weight gain). 15 tablet 1   gabapentin (NEURONTIN) 800 MG tablet Take 1 tablet (800 mg total) by mouth 3 (three) times daily. 270 tablet 1   levothyroxine (SYNTHROID) 75 MCG tablet TAKE 1 TABLET EVERY DAY BEFORE BREAKFAST 90 tablet 3  lisinopril (ZESTRIL) 10 MG tablet Take 1 tablet (10 mg total) by mouth daily. 90 tablet 1   nitroGLYCERIN (NITROSTAT) 0.4 MG SL tablet Place 1 tablet (0.4 mg total) under the tongue every 5 (five) minutes as needed for chest pain. 30 tablet 0   omeprazole (PRILOSEC) 20 MG capsule TAKE 1 CAPSULE EVERY DAY 90 capsule 3   oxybutynin (DITROPAN-XL) 10 MG 24 hr tablet Take 1 tablet (10 mg total) by mouth 2 (two) times daily. 180 tablet 1   pravastatin (PRAVACHOL) 40 MG tablet Take 1 tablet (40 mg total) by mouth every evening. 90 tablet 1   tirzepatide (MOUNJARO) 2.5 MG/0.5ML Pen Inject 2.5 mg into the skin once a week. 6 mL 0   ASCORBIC ACID PO Take 1 tablet by mouth at bedtime. (Patient not taking: Reported on 02/26/2021)     Ubrogepant (UBRELVY) 50 MG TABS Take 50 mg by mouth  as needed (migraine). Take one pill at the first sign of migraine. Max dose 50 mg in 24 hours (Patient not taking: Reported on 02/26/2021) 16 tablet 2   No current facility-administered medications on file prior to visit.     The following portions of the patient's history were reviewed and updated as appropriate: allergies, current medications, past family history, past medical history, past social history, past surgical history and problem list.  ROS Otherwise as in subjective above  Objective: BP 122/72    Pulse 81    Temp (!) 96.8 F (36 C)    Wt 229 lb 6.4 oz (104.1 kg)    SpO2 96%    BMI 43.34 kg/m   General appearance: alert, no distress, well developed, well nourished Neck: supple, no lymphadenopathy, no thyromegaly, no masses Heart: irregular irregular, no murmurs Lungs: CTA bilaterally, no wheezes, rhonchi, or rales Abdomen nontender, no mass, no organomegaly Pulses: 2+ radial pulses, 2+ pedal pulses, normal cap refill Ext: no edema    Assessment: Encounter Diagnoses  Name Primary?   Type 2 diabetes mellitus with other specified complication, without long-term current use of insulin (HCC) Yes   Edema, unspecified type    Chronic bilateral back pain, unspecified back location    Chronic diastolic CHF (congestive heart failure) (HCC)    Persistent atrial fibrillation (HCC)    Hypothyroidism, unspecified type    Chronic constipation      Plan: Diabetes-just recently started Mounjaro injection.  Not currently on glipizide.  Does not tolerate metformin not willing to take this.  Edema-pending labs if no issues can likely increase Lasix to 1/2 tablet daily or 10 mg daily increasing to 20 mg or a whole tablet on worse days of swelling.   Currently using Lasix twice weekly  Chronic back pain-follow-up with neurosurgery  Chronic CHF, persistent A-fib-follow-up with cardiology as planned for cardioversion soon  Hypothyroidism-continue current medication  Chronic  constipation-continue good water and fiber intake, and begin trial of Trulance samples given today   [name] was seen today for diabetes.  Diagnoses and all orders for this visit:  Type 2 diabetes mellitus with other specified complication, without long-term current use of insulin (HCC) -     Hemoglobin A1c  Edema, unspecified type -     Basic metabolic panel  Chronic bilateral back pain, unspecified back location  Chronic diastolic CHF (congestive heart failure) (HCC)  Persistent atrial fibrillation (HCC)  Hypothyroidism, unspecified type  Chronic constipation  Other orders -     Plecanatide (TRULANCE) 3 MG TABS; Take 1 tablet by mouth daily.  Follow up: pending labs.

## 2021-02-27 ENCOUNTER — Other Ambulatory Visit: Payer: Self-pay | Admitting: Medical

## 2021-02-27 LAB — BASIC METABOLIC PANEL
BUN/Creatinine Ratio: 16 (ref 12–28)
BUN: 13 mg/dL (ref 8–27)
CO2: 24 mmol/L (ref 20–29)
Calcium: 9.9 mg/dL (ref 8.7–10.3)
Chloride: 102 mmol/L (ref 96–106)
Creatinine, Ser: 0.82 mg/dL (ref 0.57–1.00)
Glucose: 127 mg/dL — ABNORMAL HIGH (ref 70–99)
Potassium: 4.6 mmol/L (ref 3.5–5.2)
Sodium: 141 mmol/L (ref 134–144)
eGFR: 75 mL/min/{1.73_m2} (ref >59)

## 2021-02-27 LAB — HEMOGLOBIN A1C
Est. average glucose Bld gHb Est-mCnc: 151 mg/dL
Hgb A1c MFr Bld: 6.9 % — ABNORMAL HIGH (ref 4.8–5.6)

## 2021-02-27 MED ORDER — MOUNJARO 5 MG/0.5ML ~~LOC~~ SOAJ: 5.0000 mg | SUBCUTANEOUS | 5 refills | Status: DC

## 2021-02-27 MED ORDER — FUROSEMIDE 20 MG PO TABS: 10.0000 mg | ORAL_TABLET | Freq: Every day | ORAL | 0 refills | Status: DC | PRN

## 2021-03-02 ENCOUNTER — Ambulatory Visit: Payer: Medicare Other | Admitting: Medical

## 2021-03-05 ENCOUNTER — Encounter (HOSPITAL_COMMUNITY): Payer: Self-pay | Admitting: Cardiology

## 2021-03-05 ENCOUNTER — Other Ambulatory Visit: Payer: Self-pay

## 2021-03-05 ENCOUNTER — Ambulatory Visit (HOSPITAL_COMMUNITY)
Admission: RE | Admit: 2021-03-05 | Discharge: 2021-03-05 | Disposition: A | Discharge status: Home / Self Care | Payer: Medicare Other | Attending: Cardiology | Admitting: Cardiology

## 2021-03-05 ENCOUNTER — Encounter (HOSPITAL_COMMUNITY)
Admission: RE | Disposition: A | Discharge status: Home / Self Care | Payer: Self-pay | Source: Home / Self Care | Attending: Cardiology

## 2021-03-05 ENCOUNTER — Ambulatory Visit (HOSPITAL_COMMUNITY)
Admission: RE | Admit: 2021-03-05 | Discharge: 2021-03-05 | Disposition: A | Discharge status: Home / Self Care | Payer: Medicare Other | Source: Ambulatory Visit | Attending: Physician Assistant | Admitting: Physician Assistant

## 2021-03-05 ENCOUNTER — Ambulatory Visit (HOSPITAL_BASED_OUTPATIENT_CLINIC_OR_DEPARTMENT_OTHER): Payer: Medicare Other | Admitting: Certified Registered Nurse Anesthetist

## 2021-03-05 ENCOUNTER — Ambulatory Visit (HOSPITAL_COMMUNITY): Payer: Medicare Other | Admitting: Certified Registered Nurse Anesthetist

## 2021-03-05 DIAGNOSIS — I110 Hypertensive heart disease with heart failure: Secondary | ICD-10-CM

## 2021-03-05 DIAGNOSIS — I5032 Chronic diastolic (congestive) heart failure: Secondary | ICD-10-CM | POA: Diagnosis not present

## 2021-03-05 DIAGNOSIS — I2510 Atherosclerotic heart disease of native coronary artery without angina pectoris: Secondary | ICD-10-CM | POA: Diagnosis not present

## 2021-03-05 DIAGNOSIS — D6869 Other thrombophilia: Secondary | ICD-10-CM | POA: Diagnosis not present

## 2021-03-05 DIAGNOSIS — I4819 Other persistent atrial fibrillation: Secondary | ICD-10-CM | POA: Diagnosis present

## 2021-03-05 DIAGNOSIS — I509 Heart failure, unspecified: Secondary | ICD-10-CM | POA: Diagnosis not present

## 2021-03-05 DIAGNOSIS — Z79899 Other long term (current) drug therapy: Secondary | ICD-10-CM | POA: Diagnosis not present

## 2021-03-05 DIAGNOSIS — Z6841 Body Mass Index (BMI) 40.0 and over, adult: Secondary | ICD-10-CM | POA: Insufficient documentation

## 2021-03-05 DIAGNOSIS — Z87891 Personal history of nicotine dependence: Secondary | ICD-10-CM | POA: Insufficient documentation

## 2021-03-05 DIAGNOSIS — Z7901 Long term (current) use of anticoagulants: Secondary | ICD-10-CM | POA: Insufficient documentation

## 2021-03-05 DIAGNOSIS — I4891 Unspecified atrial fibrillation: Secondary | ICD-10-CM | POA: Diagnosis not present

## 2021-03-05 DIAGNOSIS — G4733 Obstructive sleep apnea (adult) (pediatric): Secondary | ICD-10-CM | POA: Insufficient documentation

## 2021-03-05 DIAGNOSIS — E669 Obesity, unspecified: Secondary | ICD-10-CM | POA: Diagnosis not present

## 2021-03-05 HISTORY — PX: CARDIOVERSION: SHX1299

## 2021-03-05 HISTORY — DX: Sleep apnea, unspecified: G47.30

## 2021-03-05 LAB — CBC
HCT: 37.5 % (ref 36.0–46.0)
Hemoglobin: 11.7 g/dL — ABNORMAL LOW (ref 12.0–15.0)
MCH: 28.5 pg (ref 26.0–34.0)
MCHC: 31.2 g/dL (ref 30.0–36.0)
MCV: 91.5 fL (ref 80.0–100.0)
Platelets: 236 10*3/uL (ref 150–400)
RBC: 4.1 MIL/uL (ref 3.87–5.11)
RDW: 14.1 % (ref 11.5–15.5)
WBC: 6.1 10*3/uL (ref 4.0–10.5)
nRBC: 0 % (ref 0.0–0.2)

## 2021-03-05 LAB — BASIC METABOLIC PANEL
Anion gap: 10 (ref 5–15)
BUN: 11 mg/dL (ref 8–23)
CO2: 23 mmol/L (ref 22–32)
Calcium: 9.1 mg/dL (ref 8.9–10.3)
Chloride: 105 mmol/L (ref 98–111)
Creatinine, Ser: 0.89 mg/dL (ref 0.44–1.00)
GFR, Estimated: >60 mL/min (ref >60)
Glucose, Bld: 157 mg/dL — ABNORMAL HIGH (ref 70–99)
Potassium: 4.1 mmol/L (ref 3.5–5.1)
Sodium: 138 mmol/L (ref 135–145)

## 2021-03-05 SURGERY — CARDIOVERSION
Anesthesia: General

## 2021-03-05 MED ORDER — PROPOFOL 10 MG/ML IV BOLUS
INTRAVENOUS | Status: DC | PRN
  Administered 2021-03-05: 60 mg via INTRAVENOUS

## 2021-03-05 MED ORDER — SODIUM CHLORIDE 0.9 % IV SOLN: INTRAVENOUS | Status: DC

## 2021-03-05 MED ORDER — LIDOCAINE 2% (20 MG/ML) 5 ML SYRINGE
INTRAMUSCULAR | Status: DC | PRN
  Administered 2021-03-05: 60 mg via INTRAVENOUS

## 2021-03-05 NOTE — Anesthesia Procedure Notes (Signed)
Procedure Name: General with mask airway ?Date/Time: 03/05/2021 10:00 AM ?Performed by: Carolan Clines, CRNA ?Pre-anesthesia Checklist: Patient identified, Emergency Drugs available, Suction available, Patient being monitored and Timeout performed ?Patient Re-evaluated:Patient Re-evaluated prior to induction ?Oxygen Delivery Method: Ambu bag ?Preoxygenation: Pre-oxygenation with 100% oxygen ?Induction Type: IV induction ?Dental Injury: Teeth and Oropharynx as per pre-operative assessment  ? ? ? ? ?

## 2021-03-05 NOTE — CV Procedure (Signed)
Procedure: Electrical Cardioversion ?Indications:  Atrial Fibrillation ? ?Procedure Details: ? ?Consent: Risks of procedure as well as the alternatives and risks of each were explained to the (patient/caregiver).  Consent for procedure obtained. ? ?Time Out: Verified patient identification, verified procedure, site/side was marked, verified correct patient position, special equipment/implants available, medications/allergies/relevent history reviewed, required imaging and test results available. PERFORMED. ? ?Patient placed on cardiac monitor, pulse oximetry, supplemental oxygen as necessary.  ?Sedation given:  Propofol 60mg ; lidocaine 60mg  ?Pacer pads placed anterior and posterior chest. ? ?Cardioverted 1 time(s).  ?Cardioversion with synchronized biphasic 200J shock. ? ?Evaluation: ?Findings: Post procedure EKG shows: NSR ?Complications: None ?Patient did tolerate procedure well. ? ?Time Spent Directly with the Patient: ? ?41minutes  ? ?Freada Bergeron ?03/05/2021, 10:13 AM  ?

## 2021-03-05 NOTE — Discharge Instructions (Signed)

## 2021-03-05 NOTE — Anesthesia Postprocedure Evaluation (Signed)
Anesthesia Post Note ? ?Patient: LIKISHA ALLES ? ?Procedure(s) Performed: CARDIOVERSION ? ?  ? ?Patient location during evaluation: Endoscopy ?Anesthesia Type: General ?Level of consciousness: awake and alert ?Pain management: pain level controlled ?Vital Signs Assessment: post-procedure vital signs reviewed and stable ?Respiratory status: spontaneous breathing, nonlabored ventilation and respiratory function stable ?Cardiovascular status: blood pressure returned to baseline and stable ?Postop Assessment: no apparent nausea or vomiting ?Anesthetic complications: no ? ? ?No notable events documented. ? ?Last Vitals:  ?Vitals:  ? 03/05/21 1014 03/05/21 1024  ?BP: (!) 121/59 101/65  ?Pulse: 93 87  ?Resp: 19 19  ?Temp: 36.4 ?C   ?SpO2: 92% 92%  ?  ?Last Pain:  ?Vitals:  ? 03/05/21 1024  ?TempSrc:   ?PainSc: 0-No pain  ? ? ?  ?  ?  ?  ?  ?  ? ?Milady Fleener,W. EDMOND ? ? ? ? ?

## 2021-03-05 NOTE — Anesthesia Preprocedure Evaluation (Addendum)
Anesthesia Evaluation  ?Patient identified by MRN, date of birth, ID band ?Patient awake ? ? ? ?Reviewed: ?Allergy & Precautions, H&P , NPO status , Patient's Chart, lab work & pertinent test results, reviewed documented beta blocker date and time  ? ?Airway ?Mallampati: III ? ?TM Distance: >3 FB ?Neck ROM: Full ? ? ? Dental ?no notable dental hx. ?(+) Teeth Intact, Dental Advisory Given ?  ?Pulmonary ?sleep apnea , former smoker,  ?  ?Pulmonary exam normal ?breath sounds clear to auscultation ? ? ? ? ? ? Cardiovascular ?hypertension, Pt. on medications and Pt. on home beta blockers ?+ CAD and +CHF  ?+ dysrhythmias Atrial Fibrillation  ?Rhythm:Irregular Rate:Tachycardia ? ? ?  ?Neuro/Psych ?Depression negative neurological ROS ?   ? GI/Hepatic ?Neg liver ROS, GERD  Medicated,  ?Endo/Other  ?diabetesHypothyroidism Morbid obesity ? Renal/GU ?negative Renal ROS  ?negative genitourinary ?  ?Musculoskeletal ? ? Abdominal ?  ?Peds ? Hematology ? ?(+) Blood dyscrasia, anemia ,   ?Anesthesia Other Findings ? ? Reproductive/Obstetrics ?negative OB ROS ? ?  ? ? ? ? ? ? ? ? ? ? ? ? ? ?  ?  ? ? ? ? ? ? ? ?Anesthesia Physical ?Anesthesia Plan ? ?ASA: 3 ? ?Anesthesia Plan: General  ? ?Post-op Pain Management: Minimal or no pain anticipated  ? ?Induction: Intravenous ? ?PONV Risk Score and Plan: 3 and Propofol infusion and Treatment may vary due to age or medical condition ? ?Airway Management Planned: Mask ? ?Additional Equipment:  ? ?Intra-op Plan:  ? ?Post-operative Plan:  ? ?Informed Consent: I have reviewed the patients History and Physical, chart, labs and discussed the procedure including the risks, benefits and alternatives for the proposed anesthesia with the patient or authorized representative who has indicated his/her understanding and acceptance.  ? ? ? ?Dental advisory given ? ?Plan Discussed with: CRNA ? ?Anesthesia Plan Comments:   ? ? ? ? ? ? ?Anesthesia Quick Evaluation ? ?

## 2021-03-05 NOTE — Transfer of Care (Signed)
Immediate Anesthesia Transfer of Care Note ? ?Patient: Sandra Ruiz ? ?Procedure(s) Performed: CARDIOVERSION ? ?Patient Location: Endoscopy Unit ? ?Anesthesia Type:General ? ?Level of Consciousness: drowsy ? ?Airway & Oxygen Therapy: Patient Spontanous Breathing ? ?Post-op Assessment: Report given to RN and Post -op Vital signs reviewed and stable ? ?Post vital signs: Reviewed and stable ? ?Last Vitals:  ?Vitals Value Taken Time  ?BP 129/71   ?Temp    ?Pulse 94   ?Resp 17   ?SpO2 96   ? ? ?Last Pain:  ?Vitals:  ? 03/05/21 0948  ?TempSrc: Temporal  ?PainSc: 0-No pain  ?   ? ?  ? ?Complications: No notable events documented. ?

## 2021-03-05 NOTE — Interval H&P Note (Signed)
History and Physical Interval Note: ? ?03/05/2021 ?9:51 AM ? ?Sandra Ruiz  has presented today for surgery, with the diagnosis of AFIB.  The various methods of treatment have been discussed with the patient and family. After consideration of risks, benefits and other options for treatment, the patient has consented to  Procedure(s): ?CARDIOVERSION (N/A) as a surgical intervention.  The patient's history has been reviewed, patient examined, no change in status, stable for surgery.  I have reviewed the patient's chart and labs.  Questions were answered to the patient's satisfaction.   ? ? ?Freada Bergeron ? ? ?

## 2021-03-08 ENCOUNTER — Other Ambulatory Visit: Payer: Self-pay | Admitting: Medical

## 2021-03-08 ENCOUNTER — Encounter (HOSPITAL_COMMUNITY): Payer: Self-pay | Admitting: Cardiology

## 2021-03-12 ENCOUNTER — Encounter (HOSPITAL_COMMUNITY): Payer: Self-pay | Admitting: Physician Assistant

## 2021-03-12 ENCOUNTER — Ambulatory Visit (HOSPITAL_COMMUNITY)
Admission: RE | Admit: 2021-03-12 | Discharge: 2021-03-12 | Disposition: A | Discharge status: Home / Self Care | Payer: Medicare Other | Source: Ambulatory Visit | Attending: Physician Assistant | Admitting: Physician Assistant

## 2021-03-12 ENCOUNTER — Other Ambulatory Visit: Payer: Self-pay

## 2021-03-12 VITALS — BP 136/80 | HR 80 | Ht 61.0 in | Wt 224.0 lb

## 2021-03-12 DIAGNOSIS — I5032 Chronic diastolic (congestive) heart failure: Secondary | ICD-10-CM | POA: Diagnosis not present

## 2021-03-12 DIAGNOSIS — E785 Hyperlipidemia, unspecified: Secondary | ICD-10-CM | POA: Diagnosis not present

## 2021-03-12 DIAGNOSIS — I2510 Atherosclerotic heart disease of native coronary artery without angina pectoris: Secondary | ICD-10-CM | POA: Insufficient documentation

## 2021-03-12 DIAGNOSIS — Z79899 Other long term (current) drug therapy: Secondary | ICD-10-CM | POA: Insufficient documentation

## 2021-03-12 DIAGNOSIS — I480 Paroxysmal atrial fibrillation: Secondary | ICD-10-CM | POA: Diagnosis not present

## 2021-03-12 DIAGNOSIS — I4819 Other persistent atrial fibrillation: Secondary | ICD-10-CM | POA: Diagnosis not present

## 2021-03-12 DIAGNOSIS — E669 Obesity, unspecified: Secondary | ICD-10-CM | POA: Insufficient documentation

## 2021-03-12 DIAGNOSIS — G4733 Obstructive sleep apnea (adult) (pediatric): Secondary | ICD-10-CM | POA: Diagnosis not present

## 2021-03-12 DIAGNOSIS — I110 Hypertensive heart disease with heart failure: Secondary | ICD-10-CM | POA: Diagnosis not present

## 2021-03-12 DIAGNOSIS — E119 Type 2 diabetes mellitus without complications: Secondary | ICD-10-CM | POA: Diagnosis not present

## 2021-03-12 DIAGNOSIS — Z6841 Body Mass Index (BMI) 40.0 and over, adult: Secondary | ICD-10-CM | POA: Insufficient documentation

## 2021-03-12 DIAGNOSIS — D6869 Other thrombophilia: Secondary | ICD-10-CM | POA: Diagnosis not present

## 2021-03-12 DIAGNOSIS — Z7901 Long term (current) use of anticoagulants: Secondary | ICD-10-CM | POA: Diagnosis not present

## 2021-03-12 NOTE — Progress Notes (Signed)
Primary Care Physician: [name], PA-C Primary Cardiologist: [name] Primary Electrophysiologist: [name] Referring Physician: [name] is a 76 y.o. female with a history of atrial fibrillation s/p ablation x 2 in [place], HLD, OSA, HTN, DM who presents for follow up in the [facility]. The patient was initially diagnosed with atrial fibrillation in 2019 and had two ablations that year before moving here from [place]. Patient is on Eliquis for a CHADS2VASC score of 7. DCCV was arranged for 02/14/20 but she presented in [place].  The device clinic received an alert for an ongoing afib episode starting 01/31/21. She has noted more fatigue and SOB since that time. There were no specific triggers that she could identify.   On follow up today, patient is s/p DCCV on 03/05/21. She is in SR today. She does feel improved but does have intermittent dyspnea which has been chronic. No bleeding issues on anticoagulation. She did have a rash related to the DCCV pads but this has resolved.   Today, she denies symptoms of palpitations, chest pain, PND, dizziness, presyncope, syncope, bleeding, or neurologic sequela. The patient is tolerating medications without difficulties and is otherwise without complaint today.    Atrial Fibrillation Risk Factors:  she does have symptoms or diagnosis of sleep apnea. she is compliant with oral device. she does not have a history of rheumatic fever. she does not have a history of alcohol use. The patient does not have a history of early familial atrial fibrillation or other arrhythmias.  she has a BMI of Body mass index is 42.32 kg/m.Marland Kitchen Filed Weights   03/12/21 1101  Weight: 101.6 kg     Family History  Problem Relation Age of Onset   CAD Mother    Thyroid disease Mother    CAD Father    Thyroid disease Daughter    Hypothyroidism Daughter      Atrial Fibrillation Management history:  Previous  antiarrhythmic drugs: none Previous cardioversions: none Previous ablations: 2019 x 2 in [place] score: 7 Anticoagulation history: Eliquis   Past Medical History:  Diagnosis Date   Chronic pain    Depression    Diabetes mellitus without complication (HCC)    GERD (gastroesophageal reflux disease)    History of TIA (transient ischemic attack)    Hyperlipidemia    Hyperopia of both eyes with astigmatism and presbyopia 12/21/2019   Hypertension    Lyme disease    Paroxysmal atrial fibrillation ([place])    Sleep apnea    Thyroid disease    Past Surgical History:  Procedure Laterality Date   ATRIAL FIBRILLATION ABLATION     x2 in [place] N/A 03/05/2021   Procedure: CARDIOVERSION;  Surgeon: Freada Bergeron, MD;  Location: [facility] ENDOSCOPY;  Service: Cardiovascular;  Laterality: N/A;   CERVICAL SPINE SURGERY     implantable loop recorder placement  11/07/2017   MDT LINQ1 implanted in [place] for afib management by Dr Candiss Norse   LUMBAR SPINE SURGERY     TONSILLECTOMY AND ADENOIDECTOMY      Current Outpatient Medications  Medication Sig Dispense Refill   ACCU-CHEK GUIDE test strip TEST 1 - 2 TIMES DAILY 200 strip 0   Accu-Chek Softclix Lancets lancets CHECK BLOOD SUGAR ONE TO TWO TIMES DAILY. 200 each 2   ALPRAZolam (XANAX) 0.25 MG tablet Take 1 tablet (0.25 mg total) by mouth 2 (two) times  daily as needed for anxiety. 20 tablet 0   Blood Glucose Monitoring Suppl (ACCU-CHEK GUIDE ME) w/Device KIT Test 1-2 times daily 1 kit 0   carvedilol (COREG) 25 MG tablet Take 1 tablet (25 mg total) by mouth 2 (two) times daily with a meal. 180 tablet 3   diltiazem (DILT-XR) 180 MG 24 hr capsule TAKE 1 CAPSULE (180 MG TOTAL) BY MOUTH DAILY. 90 capsule 3   DULoxetine (CYMBALTA) 60 MG capsule Take 1 capsule (60 mg total) by mouth 2 (two) times daily. 180 capsule 1   ELIQUIS 5 MG TABS tablet TAKE 1 TABLET BY MOUTH TWICE  DAILY 180 tablet 0   ezetimibe  (ZETIA) 10 MG tablet Take 1 tablet (10 mg total) by mouth daily. 90 tablet 1   furosemide (LASIX) 20 MG tablet Take 0.5 tablets (10 mg total) by mouth daily as needed (swelling or weight gain). (Patient taking differently: Take 10 mg by mouth daily.) 90 tablet 0   gabapentin (NEURONTIN) 800 MG tablet Take 1 tablet (800 mg total) by mouth 3 (three) times daily. 270 tablet 1   levothyroxine (SYNTHROID) 75 MCG tablet TAKE 1 TABLET EVERY DAY BEFORE BREAKFAST 90 tablet 3   lisinopril (ZESTRIL) 10 MG tablet Take 1 tablet (10 mg total) by mouth daily. 90 tablet 1   nitroGLYCERIN (NITROSTAT) 0.4 MG SL tablet Place 1 tablet (0.4 mg total) under the tongue every 5 (five) minutes as needed for chest pain. 30 tablet 0   omeprazole (PRILOSEC) 20 MG capsule TAKE 1 CAPSULE EVERY DAY 90 capsule 3   oxybutynin (DITROPAN-XL) 10 MG 24 hr tablet Take 1 tablet (10 mg total) by mouth 2 (two) times daily. 180 tablet 1   Plecanatide (TRULANCE) 3 MG TABS Take 1 tablet by mouth daily. 6 tablet 0   pravastatin (PRAVACHOL) 40 MG tablet Take 1 tablet (40 mg total) by mouth every evening. 90 tablet 1   tirzepatide (MOUNJARO) 2.5 MG/0.5ML Pen Inject 2.5 mg into the skin once a week. 6 mL 0   tirzepatide (MOUNJARO) 5 MG/0.5ML Pen Inject 5 mg into the skin once a week. 2 mL 5   Ubrogepant (UBRELVY) 50 MG TABS Take 50 mg by mouth as needed (migraine). Take one pill at the first sign of migraine. Max dose 50 mg in 24 hours (Patient not taking: Reported on 02/26/2021) 16 tablet 2   No current facility-administered medications for this encounter.    Allergies  Allergen Reactions   Baclofen Swelling   Crestor [Rosuvastatin]     Extreme myalgias   Metformin And Related Nausea Only   Verapamil Swelling   Silicone Itching and Rash    Social History   Socioeconomic History   Marital status: Married    Spouse name: Not on file   Number of children: 5   Years of education: Not on file   Highest education level: Some college,  no degree  Occupational History   Not on file  Tobacco Use   Smoking status: Former   Smokeless tobacco: Never   Tobacco comments:    Former smoker 11/12/2020  Substance and Sexual Activity   Alcohol use: Yes    Alcohol/week: 2.0 standard drinks    Types: 2 Standard drinks or equivalent per week    Comment: mix drinks occ.   Drug use: Never   Sexual activity: Not on file  Other Topics Concern   Not on file  Social History Narrative   From [place], married, [place] Witness, new from  [place] to [place]  07/2019   Right handed   Some caffeine use    Lives with husband, daughter, and son-in-law   Social Determinants of Health   Financial Resource Strain: Not on file  Food Insecurity: Not on file  Transportation Needs: Not on file  Physical Activity: Not on file  Stress: Not on file  Social Connections: Not on file  Intimate Partner Violence: Not on file     ROS- All systems are reviewed and negative except as per the HPI above.  Physical Exam: Vitals:   03/12/21 1101  BP: 136/80  Pulse: 80  Weight: 101.6 kg  Height: _0  (1.549 m)    GEN- The patient is a well appearing obese female, alert and oriented x 3 today.   HEENT-head normocephalic, atraumatic, sclera clear, conjunctiva pink, hearing intact, trachea midline. Lungs- Clear to ausculation bilaterally, normal work of breathing Heart- Regular rate and rhythm, no murmurs, rubs or gallops  GI- soft, NT, ND, + BS Extremities- no clubbing, cyanosis, or edema MS- no significant deformity or atrophy Skin- no rash or lesion Psych- euthymic mood, full affect Neuro- strength and sensation are intact   Wt Readings from Last 3 Encounters:  03/12/21 101.6 kg  02/26/21 104.1 kg  02/13/21 103.4 kg    EKG today demonstrates  SR Vent. rate 80 BPM PR interval 174 ms QRS duration 82 ms QT/QTcB 358/412 ms  Echo 09/07/20 demonstrated   1. Left ventricular ejection fraction, by estimation, is 60  to 65%. The left ventricle has normal function. The left ventricle has no regional wall motion abnormalities. Left ventricular diastolic parameters are consistent with Grade II diastolic dysfunction (pseudonormalization).   2. Right ventricular systolic function is normal. The right ventricular  size is normal. There is normal pulmonary artery systolic pressure.   3. Left atrial size was mildly dilated.   4. The mitral valve is normal in structure. No evidence of mitral valve regurgitation. No evidence of mitral stenosis.   5. The aortic valve is normal in structure. Aortic valve regurgitation is not visualized. Mild aortic valve sclerosis is present, with no evidence of aortic valve stenosis.   6. The inferior vena cava is normal in size with greater than 50%  respiratory variability, suggesting right atrial pressure of 3 mmHg.   Comparison(s): No significant change from prior study. Prior images reviewed side by side.   Epic records are reviewed at length today  CHA2DS2-VASc Score = 8  The patient's score is based upon: CHF History: 1 HTN History: 1 Diabetes History: 1 Stroke History: 2 Vascular Disease History: 1 Age Score: 1 Gender Score: 1      ASSESSMENT AND PLAN: 1. Persistent Atrial Fibrillation (ICD10:  I48.19) The patient's CHA2DS2-VASc score is 8, indicating a 10.8% annual risk of stroke.   S/p afib ablation in [place] x 2 2019. S/p DCCV 03/05/21 ILR shows no interim afib episodes since DCCV.  Continue Coreg 25 mg BID Continue diltiazem 180 mg daily  Continue Eliquis 5 mg BID  2. Secondary Hypercoagulable State (ICD10:  D68.69) The patient is at significant risk for stroke/thromboembolism based upon her CHA2DS2-VASc Score of 8.  Continue Apixaban (Eliquis).   3. Obesity Body mass index is 42.32 kg/m. Lifestyle modification was discussed and encouraged including regular physical activity and weight reduction. Referred to [facility] PREP  4. OSA Patient reports  compliance with oral device.   5. HTN Stable, no changes today.  6. CAD Calcium score 111,  67th percentile No anginal symptoms.  7. Chronic diastolic CHF No signs or symptoms of fluid overload today.   Follow up with [name] as scheduled. AF clinic in 6 months.    [facility] [address] [place], [place] [address] [phone] 03/12/2021 11:16 AM

## 2021-03-13 LAB — HM DIABETES EYE EXAM

## 2021-03-16 ENCOUNTER — Ambulatory Visit (INDEPENDENT_AMBULATORY_CARE_PROVIDER_SITE_OTHER): Payer: Medicare Other

## 2021-03-16 DIAGNOSIS — I5032 Chronic diastolic (congestive) heart failure: Secondary | ICD-10-CM | POA: Diagnosis not present

## 2021-03-17 ENCOUNTER — Telehealth: Payer: Self-pay | Admitting: Psychiatry

## 2021-03-17 LAB — CUP PACEART REMOTE DEVICE CHECK
Date Time Interrogation Session: 20230312231016
Implantable Pulse Generator Implant Date: 20191104

## 2021-03-17 MED ORDER — BOTOX 200 UNITS IJ SOLR: 155.0000 [IU] | INTRAMUSCULAR | 3 refills | Status: DC

## 2021-03-17 NOTE — Telephone Encounter (Signed)
Rx has been sent  

## 2021-03-17 NOTE — Telephone Encounter (Signed)
LM to get Botox appt scheduled. ?

## 2021-03-17 NOTE — Addendum Note (Signed)
Addended by: Verlin Grills on: 03/17/2021 05:01 PM ? ? Modules accepted: Orders ? ?

## 2021-03-17 NOTE — Telephone Encounter (Signed)
Initiated Botox PA on [facility] portal, [place] approved auth # [id number] (03/17/2021-03/18/2022). ?

## 2021-03-17 NOTE — Telephone Encounter (Signed)
Please send Botox RX to [place]. ?

## 2021-03-20 ENCOUNTER — Telehealth: Payer: Self-pay

## 2021-03-20 NOTE — Telephone Encounter (Signed)
Call to pt reference next PREP class starting on 03/30/21 M/W 230p-345p  ?At [facility] ?Unable to do class right now due to husbands illness and needing to move ?Advised will call her back for the next class at [facility]  ?

## 2021-03-23 ENCOUNTER — Telehealth: Payer: Self-pay | Admitting: Medical

## 2021-03-23 NOTE — Telephone Encounter (Signed)
Will place in your green folder for you to fill out ?

## 2021-03-23 NOTE — Telephone Encounter (Signed)
Pt came in and dropped off form to be completed. This to have her dog stay with her at new apartment. Dog is a emotional support animal. The dog is a small Terrier around 6 pounds. Pt was offered an appt dut declined. She states she needs asap. Please send form to one of the options attached to form.  ?

## 2021-03-26 NOTE — Telephone Encounter (Signed)
Tried to call pt but vm not set up ?

## 2021-03-27 NOTE — Telephone Encounter (Signed)
Pt called back yesterday and left a message on my phone requesting a call back.  ?

## 2021-03-27 NOTE — Telephone Encounter (Signed)
Pt was notified that you are reviewing forms ?

## 2021-03-30 NOTE — Progress Notes (Signed)
[facility] Summary Report / Loop Recorder 

## 2021-03-30 NOTE — Telephone Encounter (Signed)
Shane, I have not seen the form.  If this is an emotional support form, it requires that she is in Therapy. ?

## 2021-03-30 NOTE — Telephone Encounter (Signed)
I have scanned form to email listed on sheet  ?

## 2021-03-31 NOTE — Telephone Encounter (Signed)
Vm not set up unable to lm to get Botox appt scheduled. ?

## 2021-04-08 ENCOUNTER — Ambulatory Visit (INDEPENDENT_AMBULATORY_CARE_PROVIDER_SITE_OTHER): Payer: Medicare Other | Admitting: Medical

## 2021-04-08 VITALS — BP 112/70 | HR 60 | Wt 216.0 lb

## 2021-04-08 DIAGNOSIS — E039 Hypothyroidism, unspecified: Secondary | ICD-10-CM

## 2021-04-08 DIAGNOSIS — G4733 Obstructive sleep apnea (adult) (pediatric): Secondary | ICD-10-CM

## 2021-04-08 DIAGNOSIS — I25118 Atherosclerotic heart disease of native coronary artery with other forms of angina pectoris: Secondary | ICD-10-CM

## 2021-04-08 DIAGNOSIS — Z79899 Other long term (current) drug therapy: Secondary | ICD-10-CM

## 2021-04-08 DIAGNOSIS — R5383 Other fatigue: Secondary | ICD-10-CM | POA: Diagnosis not present

## 2021-04-08 DIAGNOSIS — E041 Nontoxic single thyroid nodule: Secondary | ICD-10-CM | POA: Diagnosis not present

## 2021-04-08 DIAGNOSIS — I4819 Other persistent atrial fibrillation: Secondary | ICD-10-CM

## 2021-04-08 DIAGNOSIS — E1169 Type 2 diabetes mellitus with other specified complication: Secondary | ICD-10-CM

## 2021-04-08 DIAGNOSIS — R531 Weakness: Secondary | ICD-10-CM | POA: Diagnosis not present

## 2021-04-08 DIAGNOSIS — I10 Essential (primary) hypertension: Secondary | ICD-10-CM

## 2021-04-08 DIAGNOSIS — K5909 Other constipation: Secondary | ICD-10-CM

## 2021-04-08 DIAGNOSIS — F321 Major depressive disorder, single episode, moderate: Secondary | ICD-10-CM

## 2021-04-08 NOTE — Patient Instructions (Signed)
Anxiety, depressed mood ?I recommend you see a counselor ?We can help set up an appointment to see a counselor if desired ?We have a referral network for counseling.  Currently she declines referral. ?I am not sure if Cymbalta is helping all that much.  We might want to consider a change to something else ? ?Fatigue, weakness ?There are several possible causes of the symptoms ?Continue routine follow-up with cardiology.  You are not currently in A-fib based on exam.  Continue current medications ?You may want to talk to cardiology to see if the carvedilol dose could be lowered a little bit.  I am not sure if it can or not but sometimes beta-blockers can cause fatigue ?If you have any episodes where you feel short of breath with activity then follow-up with your cardiologist or go to hospital if severe symptoms ?Monitor BPs.  If routinely seeing BP 105/60 or less, then call or come in to see cardiology or [place] ? ?Hypothyroidism ?Your labs in January were normal so I would continue Synthroid/levothyroxine unchanged ?I doubt this is the cause of your fatigue ? ?High risk medications ?You are on several medications, some of which could cause fatigue or mood change such as carvedilol, gabapentin, Cymbalta, oxybutynin ?You are taking some these medications for pain, and you take oxybutynin for overactive bladder ?We could try weaning down the dose of 1 medicine at a time to see if that gives you any improvement of fatigue and weakness ?After our discussion, you agree to try coming off Cymbalta.  You are currently taking this twice daily ?Lets try using Cymbalta once daily for 10-12 days.  If no problems after 10 - 12 days, try using Cymbalta ever other day for the next 3 weeks and then follow up ? ?Thyroid nodule ?Your last evaluation was 2021.  If agreeable I can schedule you for updated ultrasound of the thyroid to recheck on this.  Let me know if you want to move forward on the ultrasound ? ?Chronic  constipation ?Consider enema over-the-counter for the next few days if your current regimen is not working ?Continue good hydration, continue Trulance medication to help with bowels ?Consider follow-up with your gastroenterologist Dr. Michail Sermon soon ? ?Sleep apnea ?Untreated sleep apnea can certainly cause fatigue ?Follow-up with your oral surgeon about the dental appliance does not currently working for you ? ? ? ? ? ? ?RESOURCES in [place], [place] ? ?If you are experiencing a mental health crisis or an emergency, please call 911 or go to the nearest emergency department. ? ?[facility]   [phone] ?[facility]  [phone] ?[place] ? ?Suicide Hotline 1-800-Suicide 419-687-8259)  ?National Suicide Prevention Lifeline 1-800-273-TALK  539-015-1533) ? ?Domestic Violence, Rape/Crisis - [place] (440)505-1286 ? ?The [employer] Violence Hotline 1-800-799-SAFE (770)513-4747) ? ?To report Child or Elder Abuse, please call: ?[employer]  [phone] ?[facility] Department  [phone] ? ?Teen Crisis line (575) 206-0297 or (678)[phone] ?  ? ? ?Psychiatry and Counseling services ? ?[facility] ?[place], [place], [place] [address] ?[phone] ? ? ?Dr. George Hugh, [place] ?[phone] ?[address] [place], [place] [address] ? ? ?[facility] ?[phone] ?[address], [place], [place] [address] ?

## 2021-04-08 NOTE — Progress Notes (Signed)
Subjective: ? Sandra Ruiz is a 76 y.o. female who presents for ?Chief Complaint  ?Patient presents with  ? Extremity Weakness  ?  Weakness in arms and legs. Was at an event last night and EMS had to be called having sob, BP was elevated, felt like it was panic attack. Pq-9 and GAD abnormal  ?   ?Primary Care Provider ?Tasheena Wambolt, Camelia Eng, PA-C here for primary care ? ?Current Health Care Team: ?Dentist, Dr. Radford Pax  ?Eye doctor, Dr. Velta Addison but can't remember name ?Dr. Blair Hailey, pain management/neurosurgery ?Dr. Kirk Ruths, cardiology ?Dr. Renato Shin, endocrinology ?Dr. Devonne Doughty, ophthalmology ?Dr. Wilford Corner, GI ? ?Here for not feeling well in general.  She notes some weakness.  About 5 days ago went shopping, felt weak and tired, didn't want to try on clothes.   ? ?Last night she was at an event, had to sit down and just felt exhausted like she did not want to get up and walk around.  Not specifically short of breath or chest pain, just fatigued.  EMS was called.  After some discussion they thought she might be having a panic attack.  She ultimately went home without any other evaluation or transport to the emergency department.  At the time the last night her blood pressure was elevated at 160/90 and 100 heart rate ? ?Not using CPAP.  has sleep apnea.  Has appliance that is new.  However, the dental appliance is pulling out her caps, so having trouble with this.   ? ?Compliant with medications ? ?Despite using medication, cannot seem to have a bowel movement regularly.  Feels backed up.  For the past several days ? ?She has questions about her prior thyroid nodule ? ?No other aggravating or relieving factors.   ? ?No other c/o. ? ?Past Medical History:  ?Diagnosis Date  ? Chronic pain   ? Depression   ? Diabetes mellitus without complication ([place])   ? GERD (gastroesophageal reflux disease)   ? History of TIA (transient ischemic attack)   ? Hyperlipidemia   ? Hyperopia of both eyes with  astigmatism and presbyopia 12/21/2019  ? Hypertension   ? Lyme disease   ? Paroxysmal atrial fibrillation (HCC)   ? Sleep apnea   ? Thyroid disease   ? ?Current Outpatient Medications on File Prior to Visit  ?Medication Sig Dispense Refill  ? ALPRAZolam (XANAX) 0.25 MG tablet Take 1 tablet (0.25 mg total) by mouth 2 (two) times daily as needed for anxiety. 20 tablet 0  ? carvedilol (COREG) 25 MG tablet Take 1 tablet (25 mg total) by mouth 2 (two) times daily with a meal. 180 tablet 3  ? diltiazem (DILT-XR) 180 MG 24 hr capsule TAKE 1 CAPSULE (180 MG TOTAL) BY MOUTH DAILY. 90 capsule 3  ? DULoxetine (CYMBALTA) 60 MG capsule Take 1 capsule (60 mg total) by mouth 2 (two) times daily. 180 capsule 1  ? ELIQUIS 5 MG TABS tablet TAKE 1 TABLET BY MOUTH TWICE  DAILY 180 tablet 0  ? ezetimibe (ZETIA) 10 MG tablet Take 1 tablet (10 mg total) by mouth daily. 90 tablet 1  ? furosemide (LASIX) 20 MG tablet Take 0.5 tablets (10 mg total) by mouth daily as needed (swelling or weight gain). (Patient taking differently: Take 10 mg by mouth daily.) 90 tablet 0  ? gabapentin (NEURONTIN) 800 MG tablet Take 1 tablet (800 mg total) by mouth 3 (three) times daily. 270 tablet 1  ? levothyroxine (SYNTHROID) 75 MCG  tablet TAKE 1 TABLET EVERY DAY BEFORE BREAKFAST 90 tablet 3  ? lisinopril (ZESTRIL) 10 MG tablet Take 1 tablet (10 mg total) by mouth daily. 90 tablet 1  ? nitroGLYCERIN (NITROSTAT) 0.4 MG SL tablet Place 1 tablet (0.4 mg total) under the tongue every 5 (five) minutes as needed for chest pain. 30 tablet 0  ? omeprazole (PRILOSEC) 20 MG capsule TAKE 1 CAPSULE EVERY DAY 90 capsule 3  ? oxybutynin (DITROPAN-XL) 10 MG 24 hr tablet Take 1 tablet (10 mg total) by mouth 2 (two) times daily. 180 tablet 1  ? Plecanatide (TRULANCE) 3 MG TABS Take 1 tablet by mouth daily. 6 tablet 0  ? tirzepatide (MOUNJARO) 5 MG/0.5ML Pen Inject 5 mg into the skin once a week. 2 mL 5  ? ACCU-CHEK GUIDE test strip TEST 1 - 2 TIMES DAILY 200 strip 0  ?  Accu-Chek Softclix Lancets lancets CHECK BLOOD SUGAR ONE TO TWO TIMES DAILY. 200 each 2  ? Blood Glucose Monitoring Suppl (ACCU-CHEK GUIDE ME) w/Device KIT Test 1-2 times daily 1 kit 0  ? pravastatin (PRAVACHOL) 40 MG tablet Take 1 tablet (40 mg total) by mouth every evening. (Patient not taking: Reported on 04/08/2021) 90 tablet 1  ? ?No current facility-administered medications on file prior to visit.  ? ? ? ?The following portions of the patient's history were reviewed and updated as appropriate: allergies, current medications, past family history, past medical history, past social history, past surgical history and problem list. ? ?ROS ?Otherwise as in subjective above ? ? ? ?Objective: ?BP 112/70   Pulse 60   Wt 216 lb (98 kg)   SpO2 97%   BMI 40.81 kg/m?  ? ?Wt Readings from Last 3 Encounters:  ?04/08/21 216 lb (98 kg)  ?03/12/21 224 lb (101.6 kg)  ?02/26/21 229 lb 6.4 oz (104.1 kg)  ? ?BP Readings from Last 3 Encounters:  ?04/08/21 112/70  ?03/12/21 136/80  ?03/05/21 124/65  ? ?General appearance: alert, no distress, well developed, well nourished ?HEENT: normocephalic, sclerae anicteric, conjunctiva pink and moist, TMs pearly, nares patent, no discharge or erythema, pharynx normal ?Oral cavity: MMM, no lesions ?Neck: supple, no lymphadenopathy, no thyromegaly, no masses, no JVD ?Heart: Seems to be in normal rhythm currently, RRR, normal S1, S2, no murmurs ?Lungs: CTA bilaterally, no wheezes, rhonchi, or rales ?Abdomen: +bs, soft, non tender, non distended, no masses, no hepatomegaly, no splenomegaly ?Pulses: 2+ radial pulses, 1 + pedal pulses, normal cap refill ?Ext: no edema ?Psych: Seems frustrated, otherwise answers questions appropriately ? ? ? ?Assessment: ?Encounter Diagnoses  ?Name Primary?  ? Other fatigue Yes  ? General weakness   ? Thyroid nodule   ? OSA (obstructive sleep apnea)   ? High risk medication use   ? Type 2 diabetes mellitus with other specified complication, without long-term current  use of insulin ([place])   ? Depression, major, single episode, moderate ([place])   ? Coronary artery disease of native artery of native heart with stable angina pectoris ([place])   ? Essential hypertension, benign   ? Chronic constipation   ? Persistent atrial fibrillation ([place])   ? Hypothyroidism, unspecified type   ? ? ? ?Plan: ?Anxiety, depressed mood ?I recommend you see a counselor ?We can help set up an appointment to see a counselor if desired ?We have a referral network for counseling.  Currently she declines referral. ?I am not sure if Cymbalta is helping all that much.  We might want to consider a change to  something else ? ?Fatigue, weakness ?There are several possible causes of the symptoms ?Continue routine follow-up with cardiology.  You are not currently in A-fib based on exam.  Continue current medications ?You may want to talk to cardiology to see if the carvedilol dose could be lowered a little bit.  I am not sure if it can or not but sometimes beta-blockers can cause fatigue ?If you have any episodes where you feel short of breath with activity then follow-up with your cardiologist or go to hospital if severe symptoms ?Monitor BPs.  If routinely seeing BP 105/60 or less, then call or come in to see cardiology or [place] ? ?Hypothyroidism ?Your labs in January were normal so I would continue Synthroid/levothyroxine unchanged ?I doubt this is the cause of your fatigue ? ?High risk medications ?You are on several medications, some of which could cause fatigue or mood change such as carvedilol, gabapentin, Cymbalta, oxybutynin ?You are taking some these medications for pain, and you take oxybutynin for overactive bladder ?We could try weaning down the dose of 1 medicine at a time to see if that gives you any improvement of fatigue and weakness ?After our discussion, you agree to try coming off Cymbalta.  You are currently taking this twice daily ?Lets try using Cymbalta once daily for 10-12 days.  If no problems  after 10 - 12 days, try using Cymbalta ever other day for the next 3 weeks and then follow up ? ?Thyroid nodule ?Your last evaluation was 2021.  If agreeable I can schedule you for updated ultrasound of the thyroid to rec

## 2021-04-13 ENCOUNTER — Other Ambulatory Visit: Payer: Self-pay | Admitting: Medical

## 2021-04-20 ENCOUNTER — Ambulatory Visit (INDEPENDENT_AMBULATORY_CARE_PROVIDER_SITE_OTHER): Payer: Medicare Other

## 2021-04-20 DIAGNOSIS — I4819 Other persistent atrial fibrillation: Secondary | ICD-10-CM

## 2021-04-21 LAB — CUP PACEART REMOTE DEVICE CHECK
Date Time Interrogation Session: 20230414231139
Implantable Pulse Generator Implant Date: 20191104

## 2021-04-26 ENCOUNTER — Other Ambulatory Visit: Payer: Self-pay | Admitting: Medical

## 2021-04-27 ENCOUNTER — Telehealth: Payer: Self-pay

## 2021-04-27 MED ORDER — OMEPRAZOLE 20 MG PO CPDR
DELAYED_RELEASE_CAPSULE | ORAL | 0 refills | Status: DC
Start: 2021-04-27 — End: 2021-06-22

## 2021-04-27 MED ORDER — DILTIAZEM HCL ER 180 MG PO CP24: ORAL_CAPSULE | ORAL | 0 refills | Status: DC

## 2021-04-27 MED ORDER — CARVEDILOL 25 MG PO TABS
25.0000 mg | ORAL_TABLET | Freq: Two times a day (BID) | ORAL | 0 refills | Status: DC
Start: 2021-04-27 — End: 2021-06-22

## 2021-04-27 NOTE — Telephone Encounter (Signed)
Received fax from [place] for a refill on the pts. Omeprazole, diltiazem, and Carvedilol, pt. Last apt was 04/08/21 and next apt 05/27/21. ?

## 2021-04-27 NOTE — Telephone Encounter (Signed)
sent 

## 2021-05-04 ENCOUNTER — Ambulatory Visit
Admission: RE | Admit: 2021-05-04 | Discharge: 2021-05-04 | Disposition: A | Discharge status: Home / Self Care | Payer: Medicare Other | Source: Ambulatory Visit | Attending: Medical | Admitting: Medical

## 2021-05-04 DIAGNOSIS — E041 Nontoxic single thyroid nodule: Secondary | ICD-10-CM

## 2021-05-07 ENCOUNTER — Telehealth: Payer: Self-pay

## 2021-05-07 NOTE — Progress Notes (Signed)
[facility] Summary Report / Loop Recorder 

## 2021-05-07 NOTE — Telephone Encounter (Signed)
ILR alert report received. Battery status OK. Normal device function. No new symptom,  brady, or pause episodes. No new AF episodes. AF burden is 0% of the time.   ? ?There were two tachycardia episode detected, the viewed episode was a median heart rate of 207 bpm for 30 seconds, sent to triage.  Monthly summary reports and ROV/PRN ? ?Outreach made to Pt.  She states she was able to feel her heart racing and states she has been short of breath.  ? ?Unable to determine if Pt felt this episode specifically.  She seems to indicate she feels her heart racing often and has consistently been short of breath. ? ?Pt has f/u with AFib clinic on May 13, 2021.  Advised Pt would send this information for them to review for her appointment. ? ? ? ?

## 2021-05-08 ENCOUNTER — Other Ambulatory Visit: Payer: Self-pay | Admitting: Medical

## 2021-05-08 DIAGNOSIS — Z1231 Encounter for screening mammogram for malignant neoplasm of breast: Secondary | ICD-10-CM

## 2021-05-13 ENCOUNTER — Telehealth: Payer: Self-pay

## 2021-05-13 ENCOUNTER — Encounter (HOSPITAL_COMMUNITY): Payer: Self-pay | Admitting: Physician Assistant

## 2021-05-13 ENCOUNTER — Ambulatory Visit (INDEPENDENT_AMBULATORY_CARE_PROVIDER_SITE_OTHER): Payer: Medicare Other

## 2021-05-13 ENCOUNTER — Ambulatory Visit (HOSPITAL_COMMUNITY)
Admission: RE | Admit: 2021-05-13 | Discharge: 2021-05-13 | Disposition: A | Discharge status: Home / Self Care | Payer: Medicare Other | Source: Ambulatory Visit | Attending: Physician Assistant | Admitting: Physician Assistant

## 2021-05-13 VITALS — BP 110/72 | HR 89 | Ht 61.0 in | Wt 212.2 lb

## 2021-05-13 DIAGNOSIS — I4819 Other persistent atrial fibrillation: Secondary | ICD-10-CM | POA: Insufficient documentation

## 2021-05-13 DIAGNOSIS — Z6841 Body Mass Index (BMI) 40.0 and over, adult: Secondary | ICD-10-CM | POA: Insufficient documentation

## 2021-05-13 DIAGNOSIS — I5032 Chronic diastolic (congestive) heart failure: Secondary | ICD-10-CM | POA: Diagnosis not present

## 2021-05-13 DIAGNOSIS — Z79899 Other long term (current) drug therapy: Secondary | ICD-10-CM | POA: Insufficient documentation

## 2021-05-13 DIAGNOSIS — E669 Obesity, unspecified: Secondary | ICD-10-CM | POA: Diagnosis not present

## 2021-05-13 DIAGNOSIS — G4733 Obstructive sleep apnea (adult) (pediatric): Secondary | ICD-10-CM | POA: Diagnosis not present

## 2021-05-13 DIAGNOSIS — I2510 Atherosclerotic heart disease of native coronary artery without angina pectoris: Secondary | ICD-10-CM | POA: Insufficient documentation

## 2021-05-13 DIAGNOSIS — E785 Hyperlipidemia, unspecified: Secondary | ICD-10-CM | POA: Diagnosis not present

## 2021-05-13 DIAGNOSIS — D6869 Other thrombophilia: Secondary | ICD-10-CM | POA: Diagnosis not present

## 2021-05-13 DIAGNOSIS — Z7901 Long term (current) use of anticoagulants: Secondary | ICD-10-CM | POA: Diagnosis not present

## 2021-05-13 DIAGNOSIS — I110 Hypertensive heart disease with heart failure: Secondary | ICD-10-CM | POA: Insufficient documentation

## 2021-05-13 DIAGNOSIS — E119 Type 2 diabetes mellitus without complications: Secondary | ICD-10-CM | POA: Diagnosis not present

## 2021-05-13 DIAGNOSIS — R531 Weakness: Secondary | ICD-10-CM | POA: Diagnosis not present

## 2021-05-13 LAB — CUP PACEART INCLINIC DEVICE CHECK
Date Time Interrogation Session: 20230510103700
Implantable Pulse Generator Implant Date: 20191104

## 2021-05-13 NOTE — Telephone Encounter (Signed)
-----   Message from Juluis Mire, RN sent at 05/13/2021 10:55 AM EDT ----- ?Regarding: linq adjustment ?Audry Pili would like the patients bradycardia/pauses detection on please thanks stacy ? ?

## 2021-05-13 NOTE — Progress Notes (Addendum)
? ? ?Primary Care Physician: [name], PA-C ?Primary Cardiologist: [name] ?Primary Electrophysiologist: [name] ?Referring Physician: [name] ? ? ?Sandra Ruiz is a 76 y.o. female with a history of atrial fibrillation s/p ablation x 2 in [place], HLD, OSA, HTN, DM who presents for follow up in the [facility]. The patient was initially diagnosed with atrial fibrillation in 2019 and had two ablations that year before moving here from [place]. Patient is on Eliquis for a CHADS2VASC score of 7. DCCV was arranged for 02/14/20 but she presented in [place].  The device clinic received an alert for an ongoing afib episode starting 01/31/21. She had noted more fatigue and SOB at that time. There were no specific triggers that she could identify. Patient is s/p DCCV on 03/05/21.  ? ?On follow up today, patient reports she has done well from an afib standpoint. However, she does have periodic episodes of weakness which typically last about 15 minutes but have lasted longer. She describes a prodrome of feeling very tired and then she becomes very weak and has to go lay down. No loss of consciousness. There are no specific triggers that she can identify. This has been a chronic problem for years.  ? ?Today, she denies symptoms of palpitations, chest pain, PND, dizziness, presyncope, syncope, bleeding, or neurologic sequela. The patient is tolerating medications without difficulties and is otherwise without complaint today.  ? ? ?Atrial Fibrillation Risk Factors: ? ?she does have symptoms or diagnosis of sleep apnea. ?she is compliant with oral device. ?she does not have a history of rheumatic fever. ?she does not have a history of alcohol use. ?The patient does not have a history of early familial atrial fibrillation or other arrhythmias. ? ?she has a BMI of Body mass index is 40.09 kg/m?Marland KitchenMarland Kitchen ?Filed Weights  ? 05/13/21 1017  ?Weight: 96.3 kg  ? ? ? ?Family History  ?Problem Relation  Age of Onset  ? CAD Mother   ? Thyroid disease Mother   ? CAD Father   ? Thyroid disease Daughter   ? Hypothyroidism Daughter   ? ? ? ?Atrial Fibrillation Management history: ? ?Previous antiarrhythmic drugs: none ?Previous cardioversions: none ?Previous ablations: 2019 x 2 in [place] ?CHADS2VASC score: 7 ?Anticoagulation history: Eliquis ? ? ?Past Medical History:  ?Diagnosis Date  ? Chronic pain   ? Depression   ? Diabetes mellitus without complication ([place])   ? GERD (gastroesophageal reflux disease)   ? History of TIA (transient ischemic attack)   ? Hyperlipidemia   ? Hyperopia of both eyes with astigmatism and presbyopia 12/21/2019  ? Hypertension   ? Lyme disease   ? Paroxysmal atrial fibrillation (HCC)   ? Sleep apnea   ? Thyroid disease   ? ?Past Surgical History:  ?Procedure Laterality Date  ? ATRIAL FIBRILLATION ABLATION    ? x2 in [place]  ? CARDIAC CATHETERIZATION    ? CARDIOVERSION N/A 03/05/2021  ? Procedure: CARDIOVERSION;  Surgeon: Freada Bergeron, MD;  Location: [facility] ENDOSCOPY;  Service: Cardiovascular;  Laterality: N/A;  ? CERVICAL SPINE SURGERY    ? implantable loop recorder placement  11/07/2017  ? MDT [id number] implanted in [place] for afib management by Dr Candiss Norse  ? LUMBAR SPINE SURGERY    ? TONSILLECTOMY AND ADENOIDECTOMY    ? ? ?Current Outpatient Medications  ?Medication Sig Dispense Refill  ? ACCU-CHEK GUIDE test strip TEST 1 - 2 TIMES DAILY 200 strip 0  ? Accu-Chek  Softclix Lancets lancets CHECK BLOOD SUGAR ONE TO TWO TIMES DAILY. 200 each 2  ? ALPRAZolam (XANAX) 0.25 MG tablet Take 1 tablet (0.25 mg total) by mouth 2 (two) times daily as needed for anxiety. 20 tablet 0  ? Blood Glucose Monitoring Suppl (ACCU-CHEK GUIDE ME) w/Device KIT Test 1-2 times daily 1 kit 0  ? carvedilol (COREG) 25 MG tablet Take 1 tablet (25 mg total) by mouth 2 (two) times daily with a meal. 180 tablet 0  ? diltiazem (DILT-XR) 180 MG 24 hr capsule TAKE 1 CAPSULE (180 MG TOTAL) BY MOUTH DAILY. 90 capsule 0  ? DULoxetine  (CYMBALTA) 60 MG capsule Take 1 capsule (60 mg total) by mouth 2 (two) times daily. 180 capsule 1  ? ELIQUIS 5 MG TABS tablet TAKE 1 TABLET BY MOUTH TWICE  DAILY 180 tablet 1  ? ezetimibe (ZETIA) 10 MG tablet TAKE 1 TABLET BY MOUTH DAILY 90 tablet 0  ? furosemide (LASIX) 20 MG tablet Take 0.5 tablets (10 mg total) by mouth daily as needed (swelling or weight gain). 90 tablet 0  ? gabapentin (NEURONTIN) 800 MG tablet Take 1 tablet (800 mg total) by mouth 3 (three) times daily. 270 tablet 1  ? levothyroxine (SYNTHROID) 75 MCG tablet TAKE 1 TABLET EVERY DAY BEFORE BREAKFAST 90 tablet 3  ? lisinopril (ZESTRIL) 10 MG tablet TAKE 1 TABLET BY MOUTH DAILY 90 tablet 0  ? nitroGLYCERIN (NITROSTAT) 0.4 MG SL tablet Place 1 tablet (0.4 mg total) under the tongue every 5 (five) minutes as needed for chest pain. 30 tablet 0  ? omeprazole (PRILOSEC) 20 MG capsule TAKE 1 CAPSULE EVERY DAY 90 capsule 0  ? oxybutynin (DITROPAN-XL) 10 MG 24 hr tablet TAKE 1 TABLET BY MOUTH TWICE  DAILY 180 tablet 0  ? Plecanatide (TRULANCE) 3 MG TABS Take 1 tablet by mouth daily. 6 tablet 0  ? tirzepatide (MOUNJARO) 5 MG/0.5ML Pen Inject 5 mg into the skin once a week. 2 mL 5  ? pravastatin (PRAVACHOL) 40 MG tablet Take 1 tablet (40 mg total) by mouth every evening. (Patient not taking: Reported on 04/08/2021) 90 tablet 1  ? ?No current facility-administered medications for this encounter.  ? ? ?Allergies  ?Allergen Reactions  ? Baclofen Swelling  ? Crestor [Rosuvastatin]   ?  Extreme myalgias  ? Metformin And Related Nausea Only  ? Verapamil Swelling  ? Silicone Itching and Rash  ? ? ?Social History  ? ?Socioeconomic History  ? Marital status: Married  ?  Spouse name: Not on file  ? Number of children: 5  ? Years of education: Not on file  ? Highest education level: Some college, no degree  ?Occupational History  ? Not on file  ?Tobacco Use  ? Smoking status: Former  ? Smokeless tobacco: Never  ? Tobacco comments:  ?  Former smoker 11/12/2020   ?Substance and Sexual Activity  ? Alcohol use: Yes  ?  Alcohol/week: 2.0 standard drinks  ?  Types: 2 Standard drinks or equivalent per week  ?  Comment: mix drinks occ.  ? Drug use: Never  ? Sexual activity: Not on file  ?Other Topics Concern  ? Not on file  ?Social History Narrative  ? From [place], married, Sales promotion account executive Witness, new from [place] to [place]  07/2019  ? Right handed  ? Some caffeine use   ? Lives with husband, daughter, and son-in-law  ? ?Social Determinants of Health  ? ?Financial Resource Strain: Not on file  ?  Food Insecurity: Not on file  ?Transportation Needs: Not on file  ?Physical Activity: Not on file  ?Stress: Not on file  ?Social Connections: Not on file  ?Intimate Partner Violence: Not on file  ? ? ? ?ROS- All systems are reviewed and negative except as per the HPI above. ? ?Physical Exam: ?Vitals:  ? 05/13/21 1017  ?BP: 110/72  ?Pulse: 89  ?Weight: 96.3 kg  ?Height: _0  (1.549 m)  ? ? ?GEN- The patient is a well appearing obese female, alert and oriented x 3 today.   ?HEENT-head normocephalic, atraumatic, sclera clear, conjunctiva pink, hearing intact, trachea midline. ?Lungs- Clear to ausculation bilaterally, normal work of breathing ?Heart- Regular rate and rhythm, no murmurs, rubs or gallops  ?GI- soft, NT, ND, + BS ?Extremities- no clubbing, cyanosis, or edema ?MS- no significant deformity or atrophy ?Skin- no rash or lesion ?Psych- euthymic mood, full affect ?Neuro- strength and sensation are intact ? ? ?Wt Readings from Last 3 Encounters:  ?05/13/21 96.3 kg  ?04/08/21 98 kg  ?03/12/21 101.6 kg  ? ? ?EKG today demonstrates  ?SR ?Vent. rate 89 BPM ?PR interval 176 ms ?QRS duration 82 ms ?QT/QTcB 344/418 ms ? ?Echo 09/07/20 demonstrated  ? 1. Left ventricular ejection fraction, by estimation, is 60 to 65%. The left ventricle has normal function. The left ventricle has no regional wall motion abnormalities. Left ventricular diastolic parameters are consistent with  Grade II diastolic dysfunction (pseudonormalization).  ? 2. Right ventricular systolic function is normal. The right ventricular  ?size is normal. There is normal pulmonary artery systolic pressure.  ? 3. Left atrial siz

## 2021-05-13 NOTE — Progress Notes (Signed)
Loop check in clinic. Battery status: good. R-waves .85m. 0 symptom episodes, 2 tachy episodes, 0 pause episodes, 0 brady episodes. 1 AF episodes (<0.1% burden). Monthly summary reports and ROV with [facility]. BLoletha Grayerand pause turned on today at clinic standards per R. Fenton request.  ? ? ? ?

## 2021-05-13 NOTE — Telephone Encounter (Signed)
Successful telephone encounter to patient to advise of needed device clinic appointment to turn brady/pause detection on, on her loop recorder. Patient states she lives in [facility] however she is eating lunch in [place] and request to have appointment today if possible. Patient is schedule for 1200 noon today. She is advised to present to appointment after her lunch. Patient appreciative.  ?

## 2021-05-22 LAB — CUP PACEART REMOTE DEVICE CHECK
Date Time Interrogation Session: 20230517232030
Implantable Pulse Generator Implant Date: 20191104

## 2021-05-25 ENCOUNTER — Ambulatory Visit (INDEPENDENT_AMBULATORY_CARE_PROVIDER_SITE_OTHER): Payer: Medicare Other

## 2021-05-25 DIAGNOSIS — I4819 Other persistent atrial fibrillation: Secondary | ICD-10-CM

## 2021-05-27 ENCOUNTER — Ambulatory Visit (INDEPENDENT_AMBULATORY_CARE_PROVIDER_SITE_OTHER): Payer: Medicare Other | Admitting: Medical

## 2021-05-27 VITALS — BP 110/58 | HR 81 | Wt 212.0 lb

## 2021-05-27 DIAGNOSIS — Z599 Problem related to housing and economic circumstances, unspecified: Secondary | ICD-10-CM

## 2021-05-27 DIAGNOSIS — Z1231 Encounter for screening mammogram for malignant neoplasm of breast: Secondary | ICD-10-CM

## 2021-05-27 DIAGNOSIS — N644 Mastodynia: Secondary | ICD-10-CM

## 2021-05-27 DIAGNOSIS — G8929 Other chronic pain: Secondary | ICD-10-CM

## 2021-05-27 DIAGNOSIS — F321 Major depressive disorder, single episode, moderate: Secondary | ICD-10-CM | POA: Diagnosis not present

## 2021-05-27 DIAGNOSIS — I152 Hypertension secondary to endocrine disorders: Secondary | ICD-10-CM

## 2021-05-27 DIAGNOSIS — E785 Hyperlipidemia, unspecified: Secondary | ICD-10-CM

## 2021-05-27 DIAGNOSIS — E1159 Type 2 diabetes mellitus with other circulatory complications: Secondary | ICD-10-CM

## 2021-05-27 DIAGNOSIS — M549 Dorsalgia, unspecified: Secondary | ICD-10-CM

## 2021-05-27 DIAGNOSIS — F419 Anxiety disorder, unspecified: Secondary | ICD-10-CM

## 2021-05-27 DIAGNOSIS — K5909 Other constipation: Secondary | ICD-10-CM

## 2021-05-27 DIAGNOSIS — I10 Essential (primary) hypertension: Secondary | ICD-10-CM

## 2021-05-27 DIAGNOSIS — E1169 Type 2 diabetes mellitus with other specified complication: Secondary | ICD-10-CM

## 2021-05-27 MED ORDER — BUPROPION HCL ER (XL) 150 MG PO TB24: 150.0000 mg | ORAL_TABLET | Freq: Every day | ORAL | 2 refills | Status: DC

## 2021-05-27 NOTE — Assessment & Plan Note (Signed)
Doing okay on current medication Mounjaro which she is tolerating well, continue glucose monitoring at home.  We discussed goal glucose.

## 2021-05-27 NOTE — Assessment & Plan Note (Addendum)
Updated labs today.  Prior intolerance to certain medications. Continue pravachol.

## 2021-05-27 NOTE — Assessment & Plan Note (Signed)
Discontinue Cymbalta.  Change to Wellbutrin.  Plan to increase this to 3 to 4 weeks if tolerated.  Continue seeing her clergy to help with counseling

## 2021-05-27 NOTE — Assessment & Plan Note (Addendum)
Consider financial counseling through creatinine or other local resources.  I gave her contact information to call [facility] to see if they can offer any financial assistance in regards to her medical bills

## 2021-05-27 NOTE — Assessment & Plan Note (Signed)
Follow-up with cardiology as planned given recent dizziness and low blood pressure readings

## 2021-05-27 NOTE — Assessment & Plan Note (Signed)
Continue Trulance.  Gave samples.  Continue good water and fiber intake.  Follow-up with gastroenterology as planned next month

## 2021-05-27 NOTE — Progress Notes (Signed)
Subjective:  Sandra Ruiz is a 75 y.o. female who presents for Chief Complaint  Patient presents with   diabetes    3 month follow-up on diabetes. Just FYI- reporgram the loop record at A-fib clinic due to low bp and dizziness. Having a breast pain      Primary Care Provider Tysinger, David S, PA-C here for primary care  Current Health Care Team: Dentist, Sandra Ruiz  Eye doctor, Dr. Yes but can't remember name Sandra Ruiz, pain management/neurosurgery Sandra Ruiz, cardiology Sandra Ruiz, endocrinology Sandra Ruiz, ophthalmology Sandra Ruiz, GI  Here for 3-month follow-up med check.  Her medical history includes diabetes, hyperlipidemia, history of TIA, sleep apnea, fatty liver disease, hypertension, hypothyroidism, statin intolerance, depression, chronic anemia, chronic pain, chronic CHF, aortic atherosclerosis, chronic constipation, obesity.  Having some breast pain on left x 2 months.   She has hx/o knot at 2 oclock size of a pea but not sure if it feels different.  No redness or localized swelling. Gets some discoloration but may be due to eliquis.   Anxiety, depressed mood - Currently still using Cymbalta daily, but sometimes anxiety is worse.  Last visit we changed from twice a day to once a day dosing.  The goal is to switch to something else since the Cymbalta did not seem to be helping.  Last visit she declined counseling due to back experience with counseling in the past.  Is doing counseling with one of her clergy.    Having some financial and housing stress.  She and her husband are currently living with her daughter and grandson.  They are trying to move into an independent living facility but so far has been a little difficult finding somewhere.  They were hopeful for a place in [place] but right now there is no openings.  Diabetes-Sugars looking good.  She likes to lay Mounjaro is doing and has lost little weight on this.  No  polyuria or polydipsia.  No new foot concerns.  Consitpation - does better on Trulance although its expensive.  None of the other medications on the market such as Miralax, Liness, amitiza have been helpful.    Is suppose to be seeing GI next month for updated colonoscopy  No other aggravating or relieving factors.    No other c/o.  Past Medical History:  Diagnosis Date   Chronic pain    Depression    Diabetes mellitus without complication (HCC)    GERD (gastroesophageal reflux disease)    History of TIA (transient ischemic attack)    Hyperlipidemia    Hyperopia of both eyes with astigmatism and presbyopia 12/21/2019   Hypertension    Lyme disease    Paroxysmal atrial fibrillation (HCC)    Sleep apnea    Thyroid disease    Current Outpatient Medications on File Prior to Visit  Medication Sig Dispense Refill   ALPRAZolam (XANAX) 0.25 MG tablet Take 1 tablet (0.25 mg total) by mouth 2 (two) times daily as needed for anxiety. 20 tablet 0   carvedilol (COREG) 25 MG tablet Take 1 tablet (25 mg total) by mouth 2 (two) times daily with a meal. 180 tablet 0   diltiazem (DILT-XR) 180 MG 24 hr capsule TAKE 1 CAPSULE (180 MG TOTAL) BY MOUTH DAILY. 90 capsule 0   DULoxetine (CYMBALTA) 60 MG capsule Take 1 capsule (60 mg total) by mouth 2 (two) times daily. 180 capsule 1   ELIQUIS 5 MG TABS tablet TAKE 1   TABLET BY MOUTH TWICE  DAILY 180 tablet 1   ezetimibe (ZETIA) 10 MG tablet TAKE 1 TABLET BY MOUTH DAILY 90 tablet 0   furosemide (LASIX) 20 MG tablet Take 0.5 tablets (10 mg total) by mouth daily as needed (swelling or weight gain). 90 tablet 0   gabapentin (NEURONTIN) 800 MG tablet Take 1 tablet (800 mg total) by mouth 3 (three) times daily. 270 tablet 1   levothyroxine (SYNTHROID) 75 MCG tablet TAKE 1 TABLET EVERY DAY BEFORE BREAKFAST 90 tablet 3   lisinopril (ZESTRIL) 10 MG tablet TAKE 1 TABLET BY MOUTH DAILY 90 tablet 0   nitroGLYCERIN (NITROSTAT) 0.4 MG SL tablet Place 1 tablet (0.4 mg  total) under the tongue every 5 (five) minutes as needed for chest pain. 30 tablet 0   omeprazole (PRILOSEC) 20 MG capsule TAKE 1 CAPSULE EVERY DAY 90 capsule 0   oxybutynin (DITROPAN-XL) 10 MG 24 hr tablet TAKE 1 TABLET BY MOUTH TWICE  DAILY 180 tablet 0   tirzepatide (MOUNJARO) 5 MG/0.5ML Pen Inject 5 mg into the skin once a week. 2 mL 5   ACCU-CHEK GUIDE test strip TEST 1 - 2 TIMES DAILY 200 strip 0   Accu-Chek Softclix Lancets lancets CHECK BLOOD SUGAR ONE TO TWO TIMES DAILY. 200 each 2   Blood Glucose Monitoring Suppl (ACCU-CHEK GUIDE ME) w/Device KIT Test 1-2 times daily 1 kit 0   Plecanatide (TRULANCE) 3 MG TABS Take 1 tablet by mouth daily. (Patient not taking: Reported on 05/27/2021) 6 tablet 0   pravastatin (PRAVACHOL) 40 MG tablet Take 1 tablet (40 mg total) by mouth every evening. (Patient not taking: Reported on 05/27/2021) 90 tablet 1   No current facility-administered medications on file prior to visit.     The following portions of the patient'Ruiz history were reviewed and updated as appropriate: allergies, current medications, past family history, past medical history, past social history, past surgical history and problem list.  ROS Otherwise as in subjective above    Objective: BP (!) 110/58   Pulse 81   Wt 212 lb (96.2 kg)   BMI 40.06 kg/m   Wt Readings from Last 3 Encounters:  05/27/21 212 lb (96.2 kg)  05/13/21 212 lb 3.2 oz (96.3 kg)  04/08/21 216 lb (98 kg)   BP Readings from Last 3 Encounters:  05/27/21 (!) 110/58  05/13/21 110/72  04/08/21 112/70   General appearance: alert, no distress, well developed, well nourished Left breast with tenderness of the left upper and left upper lateral breast at 12:00 1:00 2 o'clock position but no obvious mass no obvious discoloration warmth or swelling.  No lymphadenopathy.  Exam chaperoned by nurse. Neck: supple, no lymphadenopathy, no thyromegaly, no masses, no JVD Heart: Seems to be in normal rhythm currently, RRR,  normal S1, S2, no murmurs Lungs: CTA bilaterally, no wheezes, rhonchi, or rales Pulses: 2+ radial pulses, 1 + pedal pulses, normal cap refill Ext: no edema   Assessment: Encounter Diagnoses  Name Primary?   Breast pain, left Yes   Encounter for screening mammogram for malignant neoplasm of breast    Depression, major, single episode, moderate ([place])    Hypertension associated with diabetes ([place])    Chronic bilateral back pain, unspecified back location    Anxiety    Financial difficulties    Hyperlipidemia, unspecified hyperlipidemia type    Chronic constipation    Essential hypertension, benign    Type 2 diabetes mellitus with other specified complication, without long-term current use of insulin (  HCC)       Plan: Problem List Items Addressed This Visit     Diabetes mellitus (HCC)    Doing okay on current medication Mounjaro which she is tolerating well, continue glucose monitoring at home.  We discussed goal glucose.       Hyperlipidemia    Updated labs today.  Prior intolerance to certain medications. Continue pravachol.       Relevant Orders   Lipid panel   Essential hypertension, benign    Follow-up with cardiology as planned given recent dizziness and low blood pressure readings       Depression, major, single episode, moderate (HCC)    Discontinue Cymbalta.  Change to Wellbutrin.  Plan to increase this to 3 to 4 weeks if tolerated.  Continue seeing her clergy to help with counseling       Relevant Medications   buPROPion (WELLBUTRIN XL) 150 MG 24 hr tablet   Hypertension associated with diabetes (HCC)   Chronic bilateral back pain   Relevant Medications   buPROPion (WELLBUTRIN XL) 150 MG 24 hr tablet   Financial difficulties    Consider financial counseling through creatinine or other local resources.  I gave her contact information to call [place] to see if they can offer any financial assistance in regards to her medical bills       Chronic  constipation    Continue Trulance.  Gave samples.  Continue good water and fiber intake.  Follow-up with gastroenterology as planned next month       Anxiety   Relevant Medications   buPROPion (WELLBUTRIN XL) 150 MG 24 hr tablet   Other Visit Diagnoses     Breast pain, left    -  Primary   Relevant Orders   MM Digital Diagnostic Unilat L   Encounter for screening mammogram for malignant neoplasm of breast       Relevant Orders   MM Digital Screening Unilat R       Follow up: pending lab, mammograms 

## 2021-05-28 ENCOUNTER — Other Ambulatory Visit: Payer: Self-pay | Admitting: Medical

## 2021-05-28 DIAGNOSIS — E7800 Pure hypercholesterolemia, unspecified: Secondary | ICD-10-CM

## 2021-05-28 LAB — LIPID PANEL
Chol/HDL Ratio: 6.5 ratio — ABNORMAL HIGH (ref 0.0–4.4)
Cholesterol, Total: 228 mg/dL — ABNORMAL HIGH (ref 100–199)
HDL: 35 mg/dL — ABNORMAL LOW (ref >39)
LDL Chol Calc (NIH): 152 mg/dL — ABNORMAL HIGH (ref 0–99)
Triglycerides: 224 mg/dL — ABNORMAL HIGH (ref 0–149)
VLDL Cholesterol Cal: 41 mg/dL — ABNORMAL HIGH (ref 5–40)

## 2021-05-28 MED ORDER — PRAVASTATIN SODIUM 40 MG PO TABS: 40.0000 mg | ORAL_TABLET | Freq: Every evening | ORAL | 3 refills | Status: DC

## 2021-06-05 ENCOUNTER — Other Ambulatory Visit: Payer: Self-pay | Admitting: Medical

## 2021-06-05 ENCOUNTER — Telehealth: Payer: Self-pay

## 2021-06-05 DIAGNOSIS — N644 Mastodynia: Secondary | ICD-10-CM

## 2021-06-05 NOTE — Telephone Encounter (Signed)
Pt. Called crying stating Audelia Acton changed her Cymbalta to Wellbutrin 150 and told her to call back if it is not working. She stats it is not working and was crying the whole time she was talking and stated she just can't do this anymore. She stated Audelia Acton told her she could increase the Wellbutrin if the 150 is not working. I told her I would have to send this to you since Audelia Acton is out of the office.

## 2021-06-05 NOTE — Telephone Encounter (Signed)
Pt. Called back crying again I gave her your recommendations which she did not understand. I then talked to you again and explained to her increase that medication would not help her immediately like she wanted it to. I explained to her it take at least 1-2 weeks for the medicine to get in your system and see any changes. I then told her about the [facility] urgent care right down the road from [place] per [facility] recommendation. She stated she was feeling a little bit better and calmer right now but would definitely go there if she got worse like she did earlier today.

## 2021-06-10 ENCOUNTER — Other Ambulatory Visit: Payer: Self-pay | Admitting: Medical

## 2021-06-10 NOTE — Progress Notes (Signed)
[facility] Summary Report / Loop Recorder 

## 2021-06-11 ENCOUNTER — Telehealth: Payer: Self-pay | Admitting: Medical

## 2021-06-11 ENCOUNTER — Other Ambulatory Visit: Payer: Self-pay | Admitting: Medical

## 2021-06-11 MED ORDER — ALPRAZOLAM 0.25 MG PO TABS: 0.2500 mg | ORAL_TABLET | Freq: Two times a day (BID) | ORAL | 0 refills | Status: DC | PRN

## 2021-06-11 NOTE — Telephone Encounter (Signed)
Sandra Ruiz calling in says she had an allergic reaction to some medication that was given to her but she did not have the medication with her to be able to give to me. She thought she had called and told us this prior and was waiting for someone to call her but we haven't. She asks if someone can call her soon because she needs some kind of medication and is feeling anxious.

## 2021-06-11 NOTE — Telephone Encounter (Signed)
Correct. Pt is taking wellbutrin now. Pt was advised to add back cymbalta and take xanax as needed. However if she feels that it doesn't help and she starts getting angry, aniexty, suciudal and depressed then go to [facility] for behavioral health eval.   Pt would like a refill on xanax

## 2021-06-12 NOTE — Telephone Encounter (Signed)
Pt states she is taking cymbalta as she will not take the wellbutrin due to feeling weird. She is taking cymbalta twice a day and xanax if needed inbetween. She is feeling but better today

## 2021-06-12 NOTE — Telephone Encounter (Signed)
Left message for pt to call back to schedule 10-14 day follow-up

## 2021-06-12 NOTE — Addendum Note (Signed)
Addended by: Minette Headland A on: 06/12/2021 10:43 AM   Modules accepted: Orders

## 2021-06-13 ENCOUNTER — Other Ambulatory Visit: Payer: Self-pay | Admitting: Medical

## 2021-06-17 ENCOUNTER — Ambulatory Visit: Payer: Medicare Other

## 2021-06-17 ENCOUNTER — Ambulatory Visit
Admission: RE | Admit: 2021-06-17 | Discharge: 2021-06-17 | Disposition: A | Discharge status: Home / Self Care | Payer: Medicare Other | Source: Ambulatory Visit | Attending: Medical | Admitting: Medical

## 2021-06-17 DIAGNOSIS — N644 Mastodynia: Secondary | ICD-10-CM

## 2021-06-20 ENCOUNTER — Other Ambulatory Visit: Payer: Self-pay | Admitting: Medical

## 2021-06-24 ENCOUNTER — Other Ambulatory Visit: Payer: Self-pay | Admitting: Medical

## 2021-06-24 NOTE — Progress Notes (Signed)
HPI: FU atrial fibrillation. She has a history of paroxysmal atrial fibrillation and has had previous ablation in [place]. Carotid Dopplers February 2018 showed no significant stenosis. Transesophageal echocardiogram January 2020 in [place] showed normal LV function, mild mitral regurgitation and small secundum ASD.  Had implantable loop placed previously.  Cardiac CTA September 2022 showed calcium score 111 which was 67 percentile and minimal nonobstructive coronary artery disease.  Echocardiogram September 2022 showed normal LV function, grade 2 diastolic dysfunction, mild left atrial enlargement.  ABIs December 2022 moderate bilaterally.  Last cardioversion March 2023.  Since last seen she has occasional dyspnea on exertion.  No orthopnea or PND.  She did have mild pedal edema.  She occasionally has chest pain which has been longstanding.  No recent syncope.  Current Outpatient Medications  Medication Sig Dispense Refill   ACCU-CHEK GUIDE test strip TEST 1 - 2 TIMES DAILY 200 strip 0   Accu-Chek Softclix Lancets lancets CHECK BLOOD SUGAR ONE TO TWO TIMES DAILY. 200 each 2   ALPRAZolam (XANAX) 0.25 MG tablet Take 1 tablet (0.25 mg total) by mouth 2 (two) times daily as needed for anxiety. 20 tablet 0   Blood Glucose Monitoring Suppl (ACCU-CHEK GUIDE ME) w/Device KIT Test 1-2 times daily 1 kit 0   carvedilol (COREG) 25 MG tablet TAKE 1 TABLET BY MOUTH TWICE  DAILY WITH A MEAL 180 tablet 1   diltiazem (DILT-XR) 180 MG 24 hr capsule TAKE 1 CAPSULE BY MOUTH DAILY 90 capsule 1   DULoxetine (CYMBALTA) 60 MG capsule Take 1 capsule (60 mg total) by mouth 2 (two) times daily. 180 capsule 1   ELIQUIS 5 MG TABS tablet TAKE 1 TABLET BY MOUTH TWICE  DAILY 180 tablet 1   ezetimibe (ZETIA) 10 MG tablet TAKE 1 TABLET BY MOUTH DAILY 90 tablet 1   furosemide (LASIX) 20 MG tablet Take 0.5 tablets (10 mg total) by mouth daily as needed (swelling or weight gain). 90 tablet 0   gabapentin (NEURONTIN) 800  MG tablet TAKE 1 TABLET BY MOUTH 3 TIMES  DAILY 270 tablet 3   levothyroxine (SYNTHROID) 75 MCG tablet TAKE 1 TABLET EVERY DAY BEFORE BREAKFAST 90 tablet 3   lisinopril (ZESTRIL) 10 MG tablet TAKE 1 TABLET BY MOUTH DAILY 90 tablet 1   nitroGLYCERIN (NITROSTAT) 0.4 MG SL tablet Place 1 tablet (0.4 mg total) under the tongue every 5 (five) minutes as needed for chest pain. 30 tablet 0   omeprazole (PRILOSEC) 20 MG capsule TAKE 1 CAPSULE BY MOUTH DAILY 90 capsule 1   oxybutynin (DITROPAN-XL) 10 MG 24 hr tablet TAKE 1 TABLET BY MOUTH TWICE  DAILY 180 tablet 1   Plecanatide (TRULANCE) 3 MG TABS Take 1 tablet by mouth daily. 6 tablet 0   pravastatin (PRAVACHOL) 40 MG tablet Take 1 tablet (40 mg total) by mouth every evening. 90 tablet 3   tirzepatide (MOUNJARO) 5 MG/0.5ML Pen Inject 5 mg into the skin once a week. 2 mL 5   No current facility-administered medications for this visit.     Past Medical History:  Diagnosis Date   Chronic pain    Depression    Diabetes mellitus without complication (HCC)    GERD (gastroesophageal reflux disease)    History of TIA (transient ischemic attack)    Hyperlipidemia    Hyperopia of both eyes with astigmatism and presbyopia 12/21/2019   Hypertension    Lyme disease    Paroxysmal atrial fibrillation (HCC)  Sleep apnea    Thyroid disease     Past Surgical History:  Procedure Laterality Date   ATRIAL FIBRILLATION ABLATION     x2 in [place] N/A 03/05/2021   Procedure: CARDIOVERSION;  Surgeon: Freada Bergeron, MD;  Location: [facility] ENDOSCOPY;  Service: Cardiovascular;  Laterality: N/A;   CERVICAL SPINE SURGERY     implantable loop recorder placement  11/07/2017   MDT [id number] implanted in [place] for afib management by Dr Margaretha Glassing SPINE SURGERY     TONSILLECTOMY AND ADENOIDECTOMY      Social History   Socioeconomic History   Marital status: Married    Spouse name: Not on file   Number of children: 5    Years of education: Not on file   Highest education level: Some college, no degree  Occupational History   Not on file  Tobacco Use   Smoking status: Former   Smokeless tobacco: Never   Tobacco comments:    Former smoker 11/12/2020  Substance and Sexual Activity   Alcohol use: Yes    Alcohol/week: 2.0 standard drinks of alcohol    Types: 2 Standard drinks or equivalent per week    Comment: mix drinks occ.   Drug use: Never   Sexual activity: Not on file  Other Topics Concern   Not on file  Social History Narrative   From [place], married, [place] Witness, new from [place] to [place]  07/2019   Right handed   Some caffeine use    Lives with husband, daughter, and son-in-law   Social Determinants of Health   Financial Resource Strain: Not on file  Food Insecurity: Not on file  Transportation Needs: Not on file  Physical Activity: Not on file  Stress: Not on file  Social Connections: Not on file  Intimate Partner Violence: Not on file    Family History  Problem Relation Age of Onset   CAD Mother    Thyroid disease Mother    CAD Father    Thyroid disease Daughter    Hypothyroidism Daughter     ROS: Back pain but no fevers or chills, productive cough, hemoptysis, dysphasia, odynophagia, melena, hematochezia, dysuria, hematuria, rash, seizure activity, orthopnea, PND, pedal edema, claudication. Remaining systems are negative.  Physical Exam: Well-developed obese in no acute distress.  Skin is warm and dry.  HEENT is normal.  Neck is supple.  Chest is clear to auscultation with normal expansion.  Cardiovascular exam is regular rate and rhythm.  Abdominal exam nontender or distended. No masses palpated. Extremities show no edema. neuro grossly intact  ECG-normal sinus rhythm with no ST changes.  Personally reviewed  A/P  1 paroxysmal atrial fibrillation-patient remains in sinus rhythm.  Continue Cardizem and carvedilol for rate control if  atrial fibrillation recurs.  Continue apixaban.  Most recent evaluation of implantable loop recorder showed 0% atrial fibrillation burden.  2 history of small secundum ASD-follow-up echocardiogram did not show RV dilatation.  We will follow.  3 nonobstructive coronary disease-No aspirin given need for apixaban.  Continue statin.  4 hypertension-blood pressure controlled.  Continue present medications.  5 hyperlipidemia-recent lipid panel May 2023 showed total cholesterol 228, HDL 35, triglycerides 224 and LDL 152.  Patient did not tolerate Lipitor or Crestor in the past.  At the time of her recent lipid panel she was not taking her cholesterol medications.  She states she has now resumed them and it has  been greater than 8 weeks.  We will check lipids and liver.  May need Praluent or Repatha.  6 history of chest pain-longstanding.  ECG unchanged today.  Most recent cardiac CTA showed minimal nonobstructive coronary disease.  We will follow.  7 history of chronic diastolic chest heart failure-she is not volume overloaded on exam.  Continue Lasix as needed.  8 Obstructive sleep apnea-patient uses an oral device.  Kirk Ruths, MD

## 2021-06-29 ENCOUNTER — Ambulatory Visit (INDEPENDENT_AMBULATORY_CARE_PROVIDER_SITE_OTHER): Payer: Medicare Other

## 2021-06-29 DIAGNOSIS — I4819 Other persistent atrial fibrillation: Secondary | ICD-10-CM | POA: Diagnosis not present

## 2021-06-30 LAB — CUP PACEART REMOTE DEVICE CHECK
Date Time Interrogation Session: 20230619232442
Implantable Pulse Generator Implant Date: 20191104

## 2021-07-02 ENCOUNTER — Encounter: Payer: Self-pay | Admitting: Cardiology

## 2021-07-02 ENCOUNTER — Ambulatory Visit: Payer: Medicare Other | Admitting: Cardiology

## 2021-07-02 VITALS — BP 118/58 | HR 80 | Ht 60.0 in | Wt 209.0 lb

## 2021-07-02 DIAGNOSIS — I10 Essential (primary) hypertension: Secondary | ICD-10-CM

## 2021-07-02 DIAGNOSIS — I25118 Atherosclerotic heart disease of native coronary artery with other forms of angina pectoris: Secondary | ICD-10-CM

## 2021-07-02 DIAGNOSIS — I5032 Chronic diastolic (congestive) heart failure: Secondary | ICD-10-CM | POA: Diagnosis not present

## 2021-07-02 DIAGNOSIS — I480 Paroxysmal atrial fibrillation: Secondary | ICD-10-CM | POA: Diagnosis not present

## 2021-07-02 DIAGNOSIS — Q211 Atrial septal defect, unspecified: Secondary | ICD-10-CM

## 2021-07-02 DIAGNOSIS — E7800 Pure hypercholesterolemia, unspecified: Secondary | ICD-10-CM

## 2021-07-02 LAB — LIPID PANEL
Chol/HDL Ratio: 3.7 ratio (ref 0.0–4.4)
Cholesterol, Total: 128 mg/dL (ref 100–199)
HDL: 35 mg/dL — ABNORMAL LOW (ref >39)
LDL Chol Calc (NIH): 68 mg/dL (ref 0–99)
Triglycerides: 142 mg/dL (ref 0–149)
VLDL Cholesterol Cal: 25 mg/dL (ref 5–40)

## 2021-07-02 LAB — COMPREHENSIVE METABOLIC PANEL
ALT: 18 IU/L (ref 0–32)
AST: 21 IU/L (ref 0–40)
Albumin/Globulin Ratio: 2.1 (ref 1.2–2.2)
Albumin: 3.8 g/dL (ref 3.7–4.7)
Alkaline Phosphatase: 111 IU/L (ref 44–121)
BUN/Creatinine Ratio: 20 (ref 12–28)
BUN: 15 mg/dL (ref 8–27)
Bilirubin Total: 0.3 mg/dL (ref 0.0–1.2)
CO2: 28 mmol/L (ref 20–29)
Calcium: 9.2 mg/dL (ref 8.7–10.3)
Chloride: 103 mmol/L (ref 96–106)
Creatinine, Ser: 0.75 mg/dL (ref 0.57–1.00)
Globulin, Total: 1.8 g/dL (ref 1.5–4.5)
Glucose: 134 mg/dL — ABNORMAL HIGH (ref 70–99)
Potassium: 4.4 mmol/L (ref 3.5–5.2)
Sodium: 141 mmol/L (ref 134–144)
Total Protein: 5.6 g/dL — ABNORMAL LOW (ref 6.0–8.5)
eGFR: 83 mL/min/{1.73_m2} (ref >59)

## 2021-07-02 NOTE — Patient Instructions (Signed)

## 2021-07-15 ENCOUNTER — Other Ambulatory Visit: Payer: Self-pay | Admitting: Medical

## 2021-07-17 ENCOUNTER — Ambulatory Visit: Payer: Medicare Other | Admitting: Internal Medicine

## 2021-07-17 ENCOUNTER — Encounter: Payer: Self-pay | Admitting: Internal Medicine

## 2021-07-17 VITALS — BP 124/76 | HR 86 | Ht 60.0 in | Wt 204.0 lb

## 2021-07-17 DIAGNOSIS — E042 Nontoxic multinodular goiter: Secondary | ICD-10-CM

## 2021-07-17 DIAGNOSIS — E039 Hypothyroidism, unspecified: Secondary | ICD-10-CM

## 2021-07-17 NOTE — Patient Instructions (Addendum)
Please come back for labs in 1.5 months.  Please continue Levothyroxine 75 mcg daily.  Take the thyroid hormone every day, with water, at least 30 minutes before breakfast, separated by at least 4 hours from: - acid reflux medications - calcium - iron - multivitamins  No further investigation is needed for the thyroid nodules.  You should have an endocrinology follow-up appointment in 1 year.

## 2021-07-17 NOTE — Progress Notes (Signed)
Patient ID: Sandra Ruiz, female   DOB: 1945/04/29, 76 y.o.   MRN: [id number]  HPI  Sandra Ruiz is a 76 y.o.-year-old female, returning for follow-up for hypothyroidism and thyroid nodules.  She previously saw Dr. Loanne Drilling, last visit 6 months ago.  Pt. has been dx with hypothyroidism in 2002.  Pt is on levothyroxine 75 mcg daily, taken: - in am - fasting - at least 2h from b'fast - stopped calcium at night - few mo ago - stopped iron at night - 10 mo ago - no multivitamins - + PPIs (omeprazole) 2h after LT4 - not on Biotin  I reviewed pt's thyroid tests: Lab Results  Component Value Date   TSH 2.21 01/07/2021   TSH 2.010 02/04/2020   TSH 1.590 08/01/2019   FREET4 0.95 01/07/2021   T3FREE 3.2 01/07/2021   Antithyroid antibodies: No results found for: "THGAB" No components found for: "TPOAB"  She also has a history of thyroid nodules, dx'ed 2020:  Reviewed available imaging investigation reports: Thyroid U/S (05/14/2019): Solid, hypoechoic, isthmic nodule measuring 1.8 x 0.7 x 1.5 cm - TR4; other subcentimeter thyroid nodules did not meet criteria for biopsy or follow-up.  Bx of this nodule (11/08/2019): Atypia of unknown significance, scant cellularity; Afirma molecular marker: Benign Thyroid U/S (01/14/2021): The previously biopsied right isthmus TR 4 nodule is smaller measuring 1.3 x 0.6 x 0.6 cm, previously 1.8 x 0.7 x 1.5 cm.  Other subcentimeter thyroid nodules not meeting criteria for biopsy or follow-up. Thyroid U/S (05/04/2021): 1 cm isthmic nodule, which was previously biopsied.  Otherwise, subcentimeter nodules not meeting criteria for biopsy or follow-up  Of note, she had an upper endoscopy (01/29/2021): Gastritis, but no esophageal pathology.  Pt describes: - fatigue - hair loss - weight gain, but she lost 8 pounds after starting Lasix - leg swelling, resolved after starting Lasix  Pt denies: - feeling nodules in neck - hoarseness  But she does have: -  dysphagia -with both food and liquids - SOB with lying down  She has + FH of thyroid disorders: daughter born w/o a thyroid. No FH of thyroid cancer.   Pt. also has a history of paroxysmal atrial fibrillation, on Eliquis, status post ablation, HL, depression, chronic pain, DM2 -on Mounjaro.  Latest HbA1c levels reviewed: Lab Results  Component Value Date   HGBA1C 6.9 (H) 02/26/2021   HGBA1C 5.7 (H) 08/26/2020   HGBA1C 6.3 (H) 02/04/2020   HGBA1C 6.6 (H) 11/01/2019   HGBA1C 6.5 (H) 08/01/2019   ROS: + see HPI  Past Medical History:  Diagnosis Date   Chronic pain    Depression    Diabetes mellitus without complication (HCC)    GERD (gastroesophageal reflux disease)    History of TIA (transient ischemic attack)    Hyperlipidemia    Hyperopia of both eyes with astigmatism and presbyopia 12/21/2019   Hypertension    Lyme disease    Paroxysmal atrial fibrillation (HCC)    Sleep apnea    Thyroid disease    Past Surgical History:  Procedure Laterality Date   ATRIAL FIBRILLATION ABLATION     x2 in [place] N/A 03/05/2021   Procedure: CARDIOVERSION;  Surgeon: Freada Bergeron, MD;  Location: [facility] ENDOSCOPY;  Service: Cardiovascular;  Laterality: N/A;   CERVICAL SPINE SURGERY     implantable loop recorder placement  11/07/2017   MDT [id number] implanted in [place] for afib management by Dr Margaretha Glassing  SPINE SURGERY     TONSILLECTOMY AND ADENOIDECTOMY     Social History   Socioeconomic History   Marital status: Married    Spouse name: Not on file   Number of children: 5   Years of education: Not on file   Highest education level: Some college, no degree  Occupational History   Not on file  Tobacco Use   Smoking status: Former   Smokeless tobacco: Never   Tobacco comments:    Former smoker 11/12/2020  Substance and Sexual Activity   Alcohol use: Yes    Alcohol/week: 2.0 standard drinks of alcohol    Types: 2 Standard drinks or  equivalent per week    Comment: mix drinks occ.   Drug use: Never   Sexual activity: Not on file  Other Topics Concern   Not on file  Social History Narrative   From [place], married, [place] Witness, new from [place] to [place]  07/2019   Right handed   Some caffeine use    Lives with husband, daughter, and son-in-law   Social Determinants of Health   Financial Resource Strain: Not on file  Food Insecurity: Not on file  Transportation Needs: Not on file  Physical Activity: Not on file  Stress: Not on file  Social Connections: Not on file  Intimate Partner Violence: Not on file   Current Outpatient Medications on File Prior to Visit  Medication Sig Dispense Refill   ACCU-CHEK GUIDE test strip TEST 1 - 2 TIMES DAILY 200 strip 0   Accu-Chek Softclix Lancets lancets CHECK BLOOD SUGAR ONE TO TWO TIMES DAILY. 200 each 2   ALPRAZolam (XANAX) 0.25 MG tablet Take 1 tablet (0.25 mg total) by mouth 2 (two) times daily as needed for anxiety. 20 tablet 0   Blood Glucose Monitoring Suppl (ACCU-CHEK GUIDE ME) w/Device KIT Test 1-2 times daily 1 kit 0   carvedilol (COREG) 25 MG tablet TAKE 1 TABLET BY MOUTH TWICE  DAILY WITH A MEAL 180 tablet 1   diltiazem (DILT-XR) 180 MG 24 hr capsule TAKE 1 CAPSULE BY MOUTH DAILY 90 capsule 1   DULoxetine (CYMBALTA) 60 MG capsule Take 1 capsule (60 mg total) by mouth 2 (two) times daily. 180 capsule 1   ELIQUIS 5 MG TABS tablet TAKE 1 TABLET BY MOUTH TWICE  DAILY 180 tablet 1   ezetimibe (ZETIA) 10 MG tablet TAKE 1 TABLET BY MOUTH DAILY 90 tablet 1   furosemide (LASIX) 20 MG tablet Take 0.5 tablets (10 mg total) by mouth daily as needed (swelling or weight gain). 90 tablet 0   gabapentin (NEURONTIN) 800 MG tablet TAKE 1 TABLET BY MOUTH 3 TIMES  DAILY 270 tablet 3   levothyroxine (SYNTHROID) 75 MCG tablet TAKE 1 TABLET EVERY DAY BEFORE BREAKFAST 90 tablet 3   lisinopril (ZESTRIL) 10 MG tablet TAKE 1 TABLET BY MOUTH DAILY 90 tablet 1    MOUNJARO 5 MG/0.5ML Pen ADMINISTER 0.5 ML UNDER THE SKIN 1 TIME A WEEK 2 mL 1   nitroGLYCERIN (NITROSTAT) 0.4 MG SL tablet Place 1 tablet (0.4 mg total) under the tongue every 5 (five) minutes as needed for chest pain. 30 tablet 0   omeprazole (PRILOSEC) 20 MG capsule TAKE 1 CAPSULE BY MOUTH DAILY 90 capsule 1   oxybutynin (DITROPAN-XL) 10 MG 24 hr tablet TAKE 1 TABLET BY MOUTH TWICE  DAILY 180 tablet 1   Plecanatide (TRULANCE) 3 MG TABS Take 1 tablet by mouth daily. 6 tablet 0  pravastatin (PRAVACHOL) 40 MG tablet Take 1 tablet (40 mg total) by mouth every evening. 90 tablet 3   No current facility-administered medications on file prior to visit.   Allergies  Allergen Reactions   Baclofen Swelling   Crestor [Rosuvastatin]     Extreme myalgias   Metformin And Related Nausea Only   Verapamil Swelling   Silicone Itching and Rash   Family History  Problem Relation Age of Onset   CAD Mother    Thyroid disease Mother    CAD Father    Thyroid disease Daughter    Hypothyroidism Daughter     PE: BP 124/76 (BP Location: Right Arm, Patient Position: Sitting, Cuff Size: Normal)   Pulse 86   Ht 5' (1.524 m)   Wt 204 lb (92.5 kg)   SpO2 98%   BMI 39.84 kg/m  Wt Readings from Last 3 Encounters:  07/17/21 204 lb (92.5 kg)  07/02/21 209 lb (94.8 kg)  05/27/21 212 lb (96.2 kg)   Constitutional: overweight, in NAD Eyes: PERRLA, EOMI, no exophthalmos ENT: moist mucous membranes, no thyromegaly or neck masses palpable, no cervical lymphadenopathy Cardiovascular: RRR, No MRG Respiratory: CTA B Musculoskeletal: no deformities Skin: moist, warm, no rashes Neurological: + tremor with outstretched hands, DTR normal in all 4  ASSESSMENT: 1. Hypothyroidism  2.  Thyroid nodules  PLAN:  1. Patient with long-standing hypothyroidism, on levothyroxine therapy.  She is on levothyroxine. - latest thyroid labs reviewed with pt. >> normal: Lab Results  Component Value Date   TSH 2.21  01/07/2021  - she continues on LT4 75 mcg daily - pt feels good on this dose except for fatigue. - we discussed about taking the thyroid hormone every day, with water, >30 minutes before breakfast, separated by >4 hours from acid reflux medications, calcium, iron, multivitamins. Pt. is taking it correctly except for the fact that she is taking PPIs only approximately 2 hours after levothyroxine instead of 4 hours later.  I advised her to move omeprazole to lunchtime or later. - will check thyroid tests in 1.5 months after the above change: TSH and fT4 - OTW, I will see her back in a year  2.  Thyroid nodules - I reviewed the reports of her thyroid ultrasounds along with the patient. The dominant nodule was medium sized initially (1.8 cm), not large, and was solid or mostly solid, hypoechoic, but without other high risk features for cancer including - microcalcifications - presence of internal blood flow - taller-than-wide distribution - irregular contours - Pt does not have a thyroid cancer family history or a personal history of RxTx to head/neck. All these would favor benignity.  - This nodule was biopsied in 2021, initially with an indeterminate result, but in a formal molecular marker returned benign.  We discussed that this confers a very low risk for cancer.  Moreover, the nodule significantly decreased in size since the initial investigation, from 1.8 to 1.0 cm. - We discussed that no further investigation is needed for this nodule.  She has several other subcentimeter nodules, for which no intervention or follow-up is needed. -I advised her that her dysphagia does not appear to be related to the thyroid.  An endoscopy did not show esophageal pathology, only upper stomach gastritis.  It is possible that symptoms may be related to GERD or dry mouth.  Orders Placed This Encounter  Procedures   TSH   T4, free   - Total time spent for the visit: 40 minutes, in precharting,  reviewing Dr.  Cordelia Pen last note, reviewing Dr. Madelyn Brunner last note, obtaining medical information from the chart, reviewing her  previous labs, imaging evaluations, and treatments, reviewing her symptoms, counseling her about her conditions (please see the discussed topics above), and developing a plan to further investigate and treat it; she had a number of questions which I addressed.  Philemon Kingdom, MD PhD [facility] Endocrinology

## 2021-07-24 NOTE — Progress Notes (Signed)
[facility] Summary Report / Loop Recorder 

## 2021-07-30 LAB — CUP PACEART REMOTE DEVICE CHECK
Date Time Interrogation Session: 20230722233547
Implantable Pulse Generator Implant Date: 20191104

## 2021-08-03 ENCOUNTER — Ambulatory Visit (INDEPENDENT_AMBULATORY_CARE_PROVIDER_SITE_OTHER): Payer: Medicare Other

## 2021-08-03 DIAGNOSIS — I480 Paroxysmal atrial fibrillation: Secondary | ICD-10-CM

## 2021-08-25 ENCOUNTER — Other Ambulatory Visit: Payer: Self-pay | Admitting: Medical

## 2021-08-27 ENCOUNTER — Ambulatory Visit: Payer: Medicare Other | Admitting: Medical

## 2021-08-28 ENCOUNTER — Encounter: Payer: Self-pay | Admitting: Medical

## 2021-08-28 ENCOUNTER — Other Ambulatory Visit: Payer: Medicare Other

## 2021-09-02 ENCOUNTER — Other Ambulatory Visit (INDEPENDENT_AMBULATORY_CARE_PROVIDER_SITE_OTHER): Payer: Medicare Other

## 2021-09-02 ENCOUNTER — Other Ambulatory Visit: Payer: Self-pay | Admitting: Medical

## 2021-09-02 DIAGNOSIS — E039 Hypothyroidism, unspecified: Secondary | ICD-10-CM

## 2021-09-02 LAB — T4, FREE: Free T4: 0.95 ng/dL (ref 0.60–1.60)

## 2021-09-02 LAB — TSH: TSH: 0.81 u[IU]/mL (ref 0.35–5.50)

## 2021-09-06 ENCOUNTER — Other Ambulatory Visit: Payer: Self-pay | Admitting: Medical

## 2021-09-06 NOTE — Progress Notes (Signed)
[facility] Summary Report / Loop Recorder 

## 2021-09-07 LAB — CUP PACEART REMOTE DEVICE CHECK
Date Time Interrogation Session: 20230831212152
Implantable Pulse Generator Implant Date: 20191104

## 2021-09-08 ENCOUNTER — Ambulatory Visit (INDEPENDENT_AMBULATORY_CARE_PROVIDER_SITE_OTHER): Payer: Medicare Other

## 2021-09-08 DIAGNOSIS — I480 Paroxysmal atrial fibrillation: Secondary | ICD-10-CM

## 2021-09-08 NOTE — Telephone Encounter (Signed)
Has an appt 9/28

## 2021-09-10 ENCOUNTER — Encounter (HOSPITAL_COMMUNITY): Payer: Self-pay | Admitting: Physician Assistant

## 2021-09-10 ENCOUNTER — Ambulatory Visit (HOSPITAL_COMMUNITY)
Admission: RE | Admit: 2021-09-10 | Discharge: 2021-09-10 | Disposition: A | Discharge status: Home / Self Care | Payer: Medicare Other | Source: Ambulatory Visit | Attending: Physician Assistant | Admitting: Physician Assistant

## 2021-09-10 VITALS — BP 132/70 | HR 80 | Ht 60.0 in | Wt 197.6 lb

## 2021-09-10 DIAGNOSIS — Z6838 Body mass index (BMI) 38.0-38.9, adult: Secondary | ICD-10-CM | POA: Diagnosis not present

## 2021-09-10 DIAGNOSIS — I4819 Other persistent atrial fibrillation: Secondary | ICD-10-CM | POA: Insufficient documentation

## 2021-09-10 DIAGNOSIS — E669 Obesity, unspecified: Secondary | ICD-10-CM | POA: Insufficient documentation

## 2021-09-10 DIAGNOSIS — E119 Type 2 diabetes mellitus without complications: Secondary | ICD-10-CM | POA: Diagnosis not present

## 2021-09-10 DIAGNOSIS — I5032 Chronic diastolic (congestive) heart failure: Secondary | ICD-10-CM | POA: Insufficient documentation

## 2021-09-10 DIAGNOSIS — G4733 Obstructive sleep apnea (adult) (pediatric): Secondary | ICD-10-CM | POA: Diagnosis not present

## 2021-09-10 DIAGNOSIS — Z7901 Long term (current) use of anticoagulants: Secondary | ICD-10-CM | POA: Insufficient documentation

## 2021-09-10 DIAGNOSIS — D6869 Other thrombophilia: Secondary | ICD-10-CM

## 2021-09-10 DIAGNOSIS — I2510 Atherosclerotic heart disease of native coronary artery without angina pectoris: Secondary | ICD-10-CM | POA: Diagnosis not present

## 2021-09-10 DIAGNOSIS — I110 Hypertensive heart disease with heart failure: Secondary | ICD-10-CM | POA: Diagnosis not present

## 2021-09-10 NOTE — Progress Notes (Signed)
Primary Care Physician: [name], PA-C Primary Cardiologist: [name] Primary Electrophysiologist: [name] Referring Physician: [name] is a 76 y.o. female with a history of atrial fibrillation s/p ablation x 2 in [place], HLD, OSA, HTN, DM who presents for follow up in the [facility]. The patient was initially diagnosed with atrial fibrillation in 2019 and had two ablations that year before moving here from [place]. Patient is on Eliquis for a CHADS2VASC score of 7. DCCV was arranged for 02/14/20 but she presented in [place].  The device clinic received an alert for an ongoing afib episode starting 01/31/21. She had noted more fatigue and SOB at that time. There were no specific triggers that she could identify. Patient is s/p DCCV on 03/05/21.   On follow up today, patient reports that she has done well from a cardiac standpoint. Her ILR shows no new episodes of afib. She denies any bleeding issues on anticoagulation. She has continued to lose weight since starting Mounjaro.   Today, she denies symptoms of palpitations, chest pain, PND, dizziness, presyncope, syncope, bleeding, or neurologic sequela. The patient is tolerating medications without difficulties and is otherwise without complaint today.    Atrial Fibrillation Risk Factors:  she does have symptoms or diagnosis of sleep apnea. she is compliant with oral device. she does not have a history of rheumatic fever. she does not have a history of alcohol use. The patient does not have a history of early familial atrial fibrillation or other arrhythmias.  she has a BMI of Body mass index is 38.59 kg/m.Marland Kitchen Filed Weights   09/10/21 1102  Weight: 89.6 kg    Family History  Problem Relation Age of Onset   CAD Mother    Thyroid disease Mother    CAD Father    Thyroid disease Daughter    Hypothyroidism Daughter      Atrial Fibrillation Management history:  Previous  antiarrhythmic drugs: none Previous cardioversions: none Previous ablations: 2019 x 2 in [place] score: 7 Anticoagulation history: Eliquis   Past Medical History:  Diagnosis Date   Chronic pain    Depression    Diabetes mellitus without complication (HCC)    GERD (gastroesophageal reflux disease)    History of TIA (transient ischemic attack)    Hyperlipidemia    Hyperopia of both eyes with astigmatism and presbyopia 12/21/2019   Hypertension    Lyme disease    Paroxysmal atrial fibrillation ([place])    Sleep apnea    Thyroid disease    Past Surgical History:  Procedure Laterality Date   ATRIAL FIBRILLATION ABLATION     x2 in [place] N/A 03/05/2021   Procedure: CARDIOVERSION;  Surgeon: Freada Bergeron, MD;  Location: [facility];  Service: Cardiovascular;  Laterality: N/A;   CERVICAL SPINE SURGERY     implantable loop recorder placement  11/07/2017   MDT LINQ1 implanted in [place] for afib management by Dr Candiss Norse   LUMBAR SPINE SURGERY     TONSILLECTOMY AND ADENOIDECTOMY      Current Outpatient Medications  Medication Sig Dispense Refill   ACCU-CHEK GUIDE test strip TEST 1 - 2 TIMES DAILY 200 strip 0   Accu-Chek Softclix Lancets lancets CHECK BLOOD SUGAR ONE TO TWO TIMES DAILY. 200 each 2   ALPRAZolam (XANAX) 0.25 MG tablet Take 1 tablet (0.25 mg total) by mouth 2 (two) times daily as needed  for anxiety. 20 tablet 0   Blood Glucose Monitoring Suppl (ACCU-CHEK GUIDE ME) w/Device KIT Test 1-2 times daily 1 kit 0   carvedilol (COREG) 25 MG tablet TAKE 1 TABLET BY MOUTH TWICE  DAILY WITH MEALS 30 tablet 0   diltiazem (DILT-XR) 180 MG 24 hr capsule TAKE 1 CAPSULE BY MOUTH DAILY 30 capsule 0   DULoxetine (CYMBALTA) 60 MG capsule Take 1 capsule (60 mg total) by mouth 2 (two) times daily. 180 capsule 1   ELIQUIS 5 MG TABS tablet TAKE 1 TABLET BY MOUTH TWICE  DAILY 180 tablet 1   ezetimibe (ZETIA) 10 MG tablet TAKE 1 TABLET BY MOUTH  DAILY 100 tablet 0   furosemide (LASIX) 20 MG tablet Take 0.5 tablets (10 mg total) by mouth daily as needed (swelling or weight gain). 90 tablet 0   gabapentin (NEURONTIN) 800 MG tablet TAKE 1 TABLET BY MOUTH 3 TIMES  DAILY 270 tablet 3   levothyroxine (SYNTHROID) 75 MCG tablet TAKE 1 TABLET EVERY DAY BEFORE BREAKFAST 90 tablet 3   lisinopril (ZESTRIL) 10 MG tablet TAKE 1 TABLET BY MOUTH DAILY 100 tablet 0   MOUNJARO 5 MG/0.5ML Pen ADMINISTER 0.5 ML UNDER THE SKIN 1 TIME A WEEK 2 mL 1   nitroGLYCERIN (NITROSTAT) 0.4 MG SL tablet Place 1 tablet (0.4 mg total) under the tongue every 5 (five) minutes as needed for chest pain. 30 tablet 0   omeprazole (PRILOSEC) 20 MG capsule TAKE 1 CAPSULE BY MOUTH DAILY 30 capsule 0   oxybutynin (DITROPAN-XL) 10 MG 24 hr tablet TAKE 1 TABLET BY MOUTH TWICE  DAILY 200 tablet 0   Plecanatide (TRULANCE) 3 MG TABS Take 1 tablet by mouth daily. 6 tablet 0   pravastatin (PRAVACHOL) 40 MG tablet Take 1 tablet (40 mg total) by mouth every evening. 90 tablet 3   No current facility-administered medications for this encounter.    Allergies  Allergen Reactions   Baclofen Swelling   Crestor [Rosuvastatin]     Extreme myalgias   Metformin And Related Nausea Only   Verapamil Swelling   Silicone Itching and Rash    Social History   Socioeconomic History   Marital status: Married    Spouse name: Not on file   Number of children: 5   Years of education: Not on file   Highest education level: Some college, no degree  Occupational History   Not on file  Tobacco Use   Smoking status: Former   Smokeless tobacco: Never   Tobacco comments:    Former smoker 11/12/2020  Substance and Sexual Activity   Alcohol use: Yes    Alcohol/week: 2.0 standard drinks of alcohol    Types: 2 Standard drinks or equivalent per week    Comment: mix drinks occ.   Drug use: Never   Sexual activity: Not on file  Other Topics Concern   Not on file  Social History Narrative   From  [place], married, [place] Witness, new from [place] to [place]  07/2019   Right handed   Some caffeine use    Lives with husband, daughter, and son-in-law   Social Determinants of Health   Financial Resource Strain: Not on file  Food Insecurity: Not on file  Transportation Needs: Not on file  Physical Activity: Not on file  Stress: Not on file  Social Connections: Not on file  Intimate Partner Violence: Not on file     ROS- All systems are reviewed and negative except as  per the HPI above.  Physical Exam: Vitals:   09/10/21 1102  BP: 132/70  Pulse: 80  Weight: 89.6 kg  Height: 5' (1.524 m)    GEN- The patient is a well appearing obese elderly female, alert and oriented x 3 today.   HEENT-head normocephalic, atraumatic, sclera clear, conjunctiva pink, hearing intact, trachea midline. Lungs- Clear to ausculation bilaterally, normal work of breathing Heart- Regular rate and rhythm, no murmurs, rubs or gallops  GI- soft, NT, ND, + BS Extremities- no clubbing, cyanosis, or edema MS- no significant deformity or atrophy Skin- no rash or lesion Psych- euthymic mood, full affect Neuro- strength and sensation are intact   Wt Readings from Last 3 Encounters:  09/10/21 89.6 kg  07/17/21 92.5 kg  07/02/21 94.8 kg    EKG today demonstrates  SR Vent. rate 80 BPM PR interval 198 ms QRS duration 82 ms QT/QTcB 364/419 ms  Echo 09/07/20 demonstrated   1. Left ventricular ejection fraction, by estimation, is 60 to 65%. The left ventricle has normal function. The left ventricle has no regional wall motion abnormalities. Left ventricular diastolic parameters are consistent with Grade II diastolic dysfunction (pseudonormalization).   2. Right ventricular systolic function is normal. The right ventricular  size is normal. There is normal pulmonary artery systolic pressure.   3. Left atrial size was mildly dilated.   4. The mitral valve is normal in structure. No  evidence of mitral valve regurgitation. No evidence of mitral stenosis.   5. The aortic valve is normal in structure. Aortic valve regurgitation is not visualized. Mild aortic valve sclerosis is present, with no evidence of aortic valve stenosis.   6. The inferior vena cava is normal in size with greater than 50%  respiratory variability, suggesting right atrial pressure of 3 mmHg.   Comparison(s): No significant change from prior study. Prior images reviewed side by side.   Epic records are reviewed at length today  CHA2DS2-VASc Score = 8  The patient's score is based upon: CHF History: 1 HTN History: 1 Diabetes History: 1 Stroke History: 2 Vascular Disease History: 1 Age Score: 1 Gender Score: 1      ASSESSMENT AND PLAN: 1. Persistent Atrial Fibrillation (ICD10:  I48.19) The patient's CHA2DS2-VASc score is 8, indicating a 10.8% annual risk of stroke.   S/p afib ablation in [place] x 2 2019. ILR shows 0% afib burden with no new episodes.  Continue Coreg 25 mg BID Continue diltiazem 180 mg daily  Continue Eliquis 5 mg BID  2. Secondary Hypercoagulable State (ICD10:  D68.69) The patient is at significant risk for stroke/thromboembolism based upon her CHA2DS2-VASc Score of 8.  Continue Apixaban (Eliquis).   3. Obesity Body mass index is 38.59 kg/m. Lifestyle modification was discussed and encouraged including regular physical activity and weight reduction. Patient doing well with this.  4. OSA Patient uses oral device.   5. HTN Stable, no changes today.  6. CAD Calcium score 111, 67th percentile No anginal symptoms.  7. Chronic diastolic CHF Fluid status appears stable.    Follow up in the AF clinic in 6 months.    [facility] [address] [place], [place] [address] [phone] 09/10/2021 11:10 AM

## 2021-09-14 ENCOUNTER — Other Ambulatory Visit: Payer: Self-pay

## 2021-09-14 ENCOUNTER — Other Ambulatory Visit: Payer: Self-pay | Admitting: Medical

## 2021-09-14 MED ORDER — LEVOTHYROXINE SODIUM 75 MCG PO TABS: ORAL_TABLET | ORAL | 0 refills | Status: DC

## 2021-09-15 ENCOUNTER — Telehealth: Payer: Self-pay | Admitting: *Deleted

## 2021-09-15 NOTE — Telephone Encounter (Signed)
   Pre-operative Risk Assessment    Patient Name: Sandra Ruiz  DOB: [birth year] MRN: [id number]      Request for Surgical Clearance    Procedure:   LESI L3-L4  Date of Surgery:  Clearance TBD                                 Surgeon:  DR. DAVE [facility] Surgeon's Group or Practice Name:  [facility] Phone number:  [phone] Fax number:  [phone]   Type of Clearance Requested:   - Medical  - Pharmacy:  Hold Apixaban (Eliquis) x 3 DAYS PRIOR AND RESUME AFTER PROCEDURE    Type of Anesthesia:  Not Indicated   Additional requests/questions:    Jiles Prows   09/15/2021, 3:06 PM

## 2021-09-17 ENCOUNTER — Other Ambulatory Visit: Payer: Self-pay | Admitting: Medical

## 2021-09-17 DIAGNOSIS — E7800 Pure hypercholesterolemia, unspecified: Secondary | ICD-10-CM

## 2021-09-17 NOTE — Telephone Encounter (Signed)
Will route to pharm for input on anticoag. Recent Afib clinic OV 09/10/21 reviewed with 0% ILR burden per their note.

## 2021-09-18 NOTE — Telephone Encounter (Signed)
Lvm for pt to call back. KH 

## 2021-09-21 ENCOUNTER — Telehealth: Payer: Self-pay

## 2021-09-21 NOTE — Telephone Encounter (Signed)
LINQ alert received.  Device has reached RRT 9/15 Route to triage.  Attempted to contact patient to advise. No answer, LMTCB.  - Marked "I" in [facility]. -Canceled future remotes. -Discontinued from [place].

## 2021-09-21 NOTE — Telephone Encounter (Signed)
Pt is returning call. Transferred to device clinic.

## 2021-09-21 NOTE — Telephone Encounter (Signed)
I spoke with the patient to let her know that her looped reached Rrt on 09/18/2021. She wants another loop. I told her the scheduler will give her a call to schedule here with Dr. Quentin Ore or Mealor to discuss loop replacement.

## 2021-09-22 NOTE — Telephone Encounter (Signed)
Patient with diagnosis of atrial fibrillation on Eliquis for anticoagulation.    Procedure: LESI L3-4 Date of procedure: TBD   CHA2DS2-VASc Score = 8   This indicates a 10.8% annual risk of stroke. The patient's score is based upon: CHF History: 1 HTN History: 1 Diabetes History: 1 Stroke History: 2 Vascular Disease History: 1 Age Score: 1 Gender Score: 1  Chart indicates history of TIA, no date given, but prior to moving to [place] 2020  CrCl 96 Platelet count 236  Eliquis was held x 3 days 11/2020 without bridge per Dr. Stanford Breed.  No change in TIA/stroke history since then.    Per office protocol, patient can hold Eliquis for 3 days prior to procedure.   Patient will not need bridging with Lovenox (enoxaparin) around procedure.  **This guidance is not considered finalized until pre-operative APP has relayed final recommendations.**

## 2021-09-23 NOTE — Telephone Encounter (Signed)
   Patient Name: Sandra Ruiz  DOB: 1945-05-21 MRN: [id number]  Primary Cardiologist: Kirk Ruths, MD  Chart reviewed as part of pre-operative protocol coverage. Based on recent OV, patient may proceed with injection as requested. Per pharmD review, "Per office protocol, patient can hold Eliquis for 3 days prior to procedure.   Patient will not need bridging with Lovenox (enoxaparin) around procedure."  Will route this bundled recommendation to requesting provider via Epic fax function. Please call with questions.  Charlie Pitter, PA-C 09/23/2021, 5:16 PM

## 2021-09-30 ENCOUNTER — Other Ambulatory Visit: Payer: Self-pay | Admitting: Medical

## 2021-09-30 NOTE — Telephone Encounter (Signed)
Pt has an appt tomorrow °

## 2021-10-01 ENCOUNTER — Ambulatory Visit (INDEPENDENT_AMBULATORY_CARE_PROVIDER_SITE_OTHER): Payer: Medicare Other | Admitting: Medical

## 2021-10-01 VITALS — BP 122/70 | HR 88 | Wt 193.2 lb

## 2021-10-01 DIAGNOSIS — I10 Essential (primary) hypertension: Secondary | ICD-10-CM

## 2021-10-01 DIAGNOSIS — Z7185 Encounter for immunization safety counseling: Secondary | ICD-10-CM | POA: Diagnosis not present

## 2021-10-01 DIAGNOSIS — I4819 Other persistent atrial fibrillation: Secondary | ICD-10-CM

## 2021-10-01 DIAGNOSIS — E785 Hyperlipidemia, unspecified: Secondary | ICD-10-CM

## 2021-10-01 DIAGNOSIS — E039 Hypothyroidism, unspecified: Secondary | ICD-10-CM

## 2021-10-01 DIAGNOSIS — Z23 Encounter for immunization: Secondary | ICD-10-CM | POA: Diagnosis not present

## 2021-10-01 DIAGNOSIS — I152 Hypertension secondary to endocrine disorders: Secondary | ICD-10-CM

## 2021-10-01 DIAGNOSIS — E1169 Type 2 diabetes mellitus with other specified complication: Secondary | ICD-10-CM

## 2021-10-01 DIAGNOSIS — E1159 Type 2 diabetes mellitus with other circulatory complications: Secondary | ICD-10-CM

## 2021-10-01 DIAGNOSIS — F419 Anxiety disorder, unspecified: Secondary | ICD-10-CM

## 2021-10-01 DIAGNOSIS — Z79899 Other long term (current) drug therapy: Secondary | ICD-10-CM

## 2021-10-01 LAB — POCT GLYCOSYLATED HEMOGLOBIN (HGB A1C): Hemoglobin A1C: 6.3 % — AB (ref 4.0–5.6)

## 2021-10-01 MED ORDER — DILTIAZEM HCL ER 180 MG PO CP24: ORAL_CAPSULE | ORAL | 1 refills | Status: DC

## 2021-10-01 MED ORDER — TIRZEPATIDE 7.5 MG/0.5ML ~~LOC~~ SOAJ: 7.5000 mg | SUBCUTANEOUS | 1 refills | Status: DC

## 2021-10-01 MED ORDER — CARVEDILOL 25 MG PO TABS: 25.0000 mg | ORAL_TABLET | Freq: Two times a day (BID) | ORAL | 3 refills | Status: DC

## 2021-10-01 MED ORDER — DULOXETINE HCL 60 MG PO CPEP: 60.0000 mg | ORAL_CAPSULE | Freq: Two times a day (BID) | ORAL | 1 refills | Status: DC

## 2021-10-01 MED ORDER — APIXABAN 5 MG PO TABS: 5.0000 mg | ORAL_TABLET | Freq: Two times a day (BID) | ORAL | 3 refills | Status: DC

## 2021-10-01 NOTE — Progress Notes (Signed)
Subjective:  Sandra Ruiz is a 76 y.o. female who presents for Chief Complaint  Patient presents with   Diabetes    Diabetes- might have to increase mounjaro. Flu shot today     Primary Care Provider Sandra Ruiz, Sandra Eng, PA-C here for primary care  Current Health Care Team: Dentist, Dr. Tyson Ruiz doctor, Dr. Velta Ruiz but can't remember name Dr. Blair Ruiz, pain management/neurosurgery Dr. Kirk Ruiz, cardiology Dr. Renato Ruiz, endocrinology Dr. Devonne Ruiz, ophthalmology Dr. Wilford Ruiz, GI   Concerns Here for med check.  She is excited as she has lost weight recently on Mounjaro.  Would like to go up to the next higher dose.  No side effects from medication.  Sugars have been looking good.  She is compliant with her medications in general.  We reviewed her medication list.  Back in June she had a hard time with the trial of Wellbutrin and did not tolerate this at all.  She is doing okay back on Cymbalta.  It took 2 weeks to fill Bactrim for baseline after doing the trial of Wellbutrin and stopping this.  Not using CPAP. Since losing weight sleeping better and her smart watch is showing improvements in sleep quality.  No other aggravating or relieving factors.    No other c/o.  Past Medical History:  Diagnosis Date   Chronic pain    Depression    Diabetes mellitus without complication (HCC)    GERD (gastroesophageal reflux disease)    History of TIA (transient ischemic attack)    Hyperlipidemia    Hyperopia of both eyes with astigmatism and presbyopia 12/21/2019   Hypertension    Lyme disease    Paroxysmal atrial fibrillation (HCC)    Sleep apnea    Thyroid disease    Current Outpatient Medications on File Prior to Visit  Medication Sig Dispense Refill   ACCU-CHEK GUIDE test strip TEST 1 - 2 TIMES DAILY 200 strip 0   Accu-Chek Softclix Lancets lancets CHECK BLOOD SUGAR ONE TO TWO TIMES DAILY. 200 each 2   ALPRAZolam (XANAX) 0.25 MG tablet Take 1  tablet (0.25 mg total) by mouth 2 (two) times daily as needed for anxiety. 20 tablet 0   Blood Glucose Monitoring Suppl (ACCU-CHEK GUIDE ME) w/Device KIT Test 1-2 times daily 1 kit 0   ezetimibe (ZETIA) 10 MG tablet TAKE 1 TABLET BY MOUTH DAILY 100 tablet 0   furosemide (LASIX) 20 MG tablet TAKE 0.5 TABLET BY MOUTH DAILY AS NEEDED SWELLING OR WEIGHT GAIN 90 tablet 0   gabapentin (NEURONTIN) 800 MG tablet TAKE 1 TABLET BY MOUTH 3 TIMES  DAILY 270 tablet 3   levothyroxine (SYNTHROID) 75 MCG tablet TAKE 1 TABLET EVERY DAY BEFORE BREAKFAST 90 tablet 0   lisinopril (ZESTRIL) 10 MG tablet TAKE 1 TABLET BY MOUTH DAILY 100 tablet 0   nitroGLYCERIN (NITROSTAT) 0.4 MG SL tablet Place 1 tablet (0.4 mg total) under the tongue every 5 (five) minutes as needed for chest pain. 30 tablet 0   omeprazole (PRILOSEC) 20 MG capsule TAKE 1 CAPSULE BY MOUTH DAILY 30 capsule 0   oxybutynin (DITROPAN-XL) 10 MG 24 hr tablet TAKE 1 TABLET BY MOUTH TWICE  DAILY 200 tablet 0   Plecanatide (TRULANCE) 3 MG TABS Take 1 tablet by mouth daily. 6 tablet 0   pravastatin (PRAVACHOL) 40 MG tablet TAKE 1 TABLET BY MOUTH IN THE  EVENING 100 tablet 2   No current facility-administered medications on file prior to visit.  The following portions of the patient's history were reviewed and updated as appropriate: allergies, current medications, past family history, past medical history, past social history, past surgical history and problem list.  ROS Otherwise as in subjective above    Objective: BP 122/70   Pulse 88   Wt 193 lb 3.2 oz (87.6 kg)   BMI 37.73 kg/m   Wt Readings from Last 3 Encounters:  10/01/21 193 lb 3.2 oz (87.6 kg)  09/10/21 197 lb 9.6 oz (89.6 kg)  07/17/21 204 lb (92.5 kg)   BP Readings from Last 3 Encounters:  10/01/21 122/70  09/10/21 132/70  07/17/21 124/76   General appearance: alert, no distress, well developed, well nourished No edema Psych: pleasant, good eye  contacts   Assessment: Encounter Diagnoses  Name Primary?   Type 2 diabetes mellitus with other specified complication, without long-term current use of insulin (HCC) Yes   Needs flu shot    Vaccine counseling    Persistent atrial fibrillation (HCC)    Hypothyroidism, unspecified type    Hypertension associated with diabetes ([place])    Hyperlipidemia, unspecified hyperlipidemia type    High risk medication use    Essential hypertension, benign    Anxiety      Plan: Diabetes-increase to the next higher dose of Mounjaro.  After 1 month she will call back and let me know if she wants to go up to the 10 mg dose.  We absolutely want to avoid hypoglycemia though.  Continue glucose monitoring.  Obesity-congratulated her on her recent weight loss.  She seems to have this have seen her in a long time since she has lost some weight.  Continue efforts with diet and exercise and the next higher dose of Mounjaro.  Sleep apnea seems to be improved at the moment with the recent weight loss.  She is not using CPAP.  She has an oral airway device for sleep apnea  Anxiety, depressed mood Seems to be doing okay on Cymbalta  Hypothyroidism continue Synthroid/levothyroxine unchanged  Counseled on the influenza virus vaccine.  Vaccine information sheet given.   High dose Influenza vaccine given after consent obtained.    Sandra Ruiz was seen today for diabetes.  Diagnoses and all orders for this visit:  Type 2 diabetes mellitus with other specified complication, without long-term current use of insulin (HCC) -     HgB A1c -     Microalbumin/Creatinine Ratio, Urine  Needs flu shot -     Flu Vaccine QUAD High Dose(Fluad)  Vaccine counseling  Persistent atrial fibrillation (HCC)  Hypothyroidism, unspecified type  Hypertension associated with diabetes ([place])  Hyperlipidemia, unspecified hyperlipidemia type  High risk medication use  Essential hypertension, benign  Anxiety  Other  orders -     tirzepatide (MOUNJARO) 7.5 MG/0.5ML Pen; Inject 7.5 mg into the skin once a week. -     DULoxetine (CYMBALTA) 60 MG capsule; Take 1 capsule (60 mg total) by mouth 2 (two) times daily. -     apixaban (ELIQUIS) 5 MG TABS tablet; Take 1 tablet (5 mg total) by mouth 2 (two) times daily. -     carvedilol (COREG) 25 MG tablet; Take 1 tablet (25 mg total) by mouth 2 (two) times daily with a meal. -     diltiazem (DILT-XR) 180 MG 24 hr capsule; TAKE 1 CAPSULE BY MOUTH DAILY   Follow up: call back 47mo

## 2021-10-01 NOTE — Progress Notes (Signed)
[facility] Summary Report / Loop Recorder 

## 2021-10-02 LAB — MICROALBUMIN / CREATININE URINE RATIO
Creatinine, Urine: 159.3 mg/dL
Microalb/Creat Ratio: 5 mg/g creat (ref 0–29)
Microalbumin, Urine: 8.6 ug/mL

## 2021-10-15 ENCOUNTER — Other Ambulatory Visit: Payer: Self-pay | Admitting: Medical

## 2021-10-22 ENCOUNTER — Encounter: Payer: Medicare Other | Admitting: Cardiovascular Disease

## 2021-10-25 NOTE — Progress Notes (Signed)
Cardiology Office Note:    Date:  10/26/2021   ID:  Sandra Ruiz, DOB [birth year], MRN [id number]  PCP:  Caryl Ada   [place] [facility] Cardiologist:  Kirk Ruths, MD Electrophysiologist:  Melida Quitter, MD     Referring MD: [name], PA-C   Chief complaint: palpitations, lightheadedness  History of Present Illness:    Sandra Ruiz is a 76 y.o. female with a hx of sleep apnea hypertension, diabetes, obesity atrial fibrillation and has undergone ablation twice in [place].   She was diagnosed with atrial fibrillation in 2019.  She had recurrence and had cardioversion was scheduled for February 2022, but she spontaneously converted back to sinus rhythm.  She has an implantable loop recorder that detected an episode in January 2023.  This episode was associated with fatigue and shortness of breath.  She underwent cardioversion in March 2023.    Past Medical History:  Diagnosis Date   Chronic pain    Depression    Diabetes mellitus without complication (HCC)    GERD (gastroesophageal reflux disease)    History of TIA (transient ischemic attack)    Hyperlipidemia    Hyperopia of both eyes with astigmatism and presbyopia 12/21/2019   Hypertension    Lyme disease    Paroxysmal atrial fibrillation (HCC)    Sleep apnea    Thyroid disease     Past Surgical History:  Procedure Laterality Date   ATRIAL FIBRILLATION ABLATION     x2 in [place] N/A 03/05/2021   Procedure: CARDIOVERSION;  Surgeon: Freada Bergeron, MD;  Location: [facility] ENDOSCOPY;  Service: Cardiovascular;  Laterality: N/A;   CERVICAL SPINE SURGERY     implantable loop recorder placement  11/07/2017   MDT [id number] implanted in [place] for afib management by Dr Candiss Norse   LUMBAR SPINE SURGERY     TONSILLECTOMY AND ADENOIDECTOMY      Current Medications: Current Meds  Medication Sig   ACCU-CHEK GUIDE test strip TEST 1 - 2 TIMES DAILY    Accu-Chek Softclix Lancets lancets CHECK BLOOD SUGAR ONE TO TWO TIMES DAILY.   ALPRAZolam (XANAX) 0.25 MG tablet Take 1 tablet (0.25 mg total) by mouth 2 (two) times daily as needed for anxiety.   apixaban (ELIQUIS) 5 MG TABS tablet Take 1 tablet (5 mg total) by mouth 2 (two) times daily.   Blood Glucose Monitoring Suppl (ACCU-CHEK GUIDE ME) w/Device KIT Test 1-2 times daily   carvedilol (COREG) 25 MG tablet Take 1 tablet (25 mg total) by mouth 2 (two) times daily with a meal.   diltiazem (DILT-XR) 180 MG 24 hr capsule TAKE 1 CAPSULE BY MOUTH DAILY   DULoxetine (CYMBALTA) 60 MG capsule Take 1 capsule (60 mg total) by mouth 2 (two) times daily.   ezetimibe (ZETIA) 10 MG tablet TAKE 1 TABLET BY MOUTH DAILY   furosemide (LASIX) 20 MG tablet TAKE 0.5 TABLET BY MOUTH DAILY AS NEEDED SWELLING OR WEIGHT GAIN   gabapentin (NEURONTIN) 800 MG tablet TAKE 1 TABLET BY MOUTH 3 TIMES  DAILY   levothyroxine (SYNTHROID) 75 MCG tablet TAKE 1 TABLET EVERY DAY BEFORE BREAKFAST   lisinopril (ZESTRIL) 10 MG tablet TAKE 1 TABLET BY MOUTH DAILY   nitroGLYCERIN (NITROSTAT) 0.4 MG SL tablet Place 1 tablet (0.4 mg total) under the tongue every 5 (five) minutes as needed for chest pain.   omeprazole (PRILOSEC) 20 MG capsule TAKE 1 CAPSULE BY MOUTH DAILY  oxybutynin (DITROPAN-XL) 10 MG 24 hr tablet TAKE 1 TABLET BY MOUTH TWICE  DAILY   Plecanatide (TRULANCE) 3 MG TABS Take 1 tablet by mouth daily.   pravastatin (PRAVACHOL) 40 MG tablet TAKE 1 TABLET BY MOUTH IN THE  EVENING   tirzepatide (MOUNJARO) 7.5 MG/0.5ML Pen Inject 7.5 mg into the skin once a week.     Allergies:   Baclofen, Crestor [rosuvastatin], Metformin and related, Verapamil, Silicone, and Wellbutrin [bupropion]   Social History   Socioeconomic History   Marital status: Married    Spouse name: Not on file   Number of children: 5   Years of education: Not on file   Highest education level: Some college, no degree  Occupational History   Not on file   Tobacco Use   Smoking status: Former   Smokeless tobacco: Never   Tobacco comments:    Former smoker 11/12/2020  Substance and Sexual Activity   Alcohol use: Yes    Alcohol/week: 2.0 standard drinks of alcohol    Types: 2 Standard drinks or equivalent per week    Comment: mix drinks occ.   Drug use: Never   Sexual activity: Not on file  Other Topics Concern   Not on file  Social History Narrative   From [place], married, [place] Witness, new from [place] to [place]  07/2019   Right handed   Some caffeine use    Lives with husband, daughter, and son-in-law   Social Determinants of Health   Financial Resource Strain: Not on file  Food Insecurity: Not on file  Transportation Needs: Not on file  Physical Activity: Not on file  Stress: Not on file  Social Connections: Not on file     Family History: The patient's family history includes CAD in her father and mother; Hypothyroidism in her daughter; Thyroid disease in her daughter and mother.  ROS:   Please see the history of present illness.    All other systems reviewed and are negative.  EKGs/Labs/Other Studies Reviewed:     CT Coronary 09/22/2020 1. 3 vessel coronary calcium which is 20 th percentile for age/sex 2.  Normal aortic root 3.4 cm 3.  CAD RADS 1 non obstructive CAD see description above 4.  No PFO noted  TTE 06/21/2019 EF 65-70%. Grade I diastolic dysfunction. Normal LA size  EKG:  Last EKG results: Today: sinus rhythm with frequent PACs   Recent Labs: 11/12/2020: B Natriuretic Peptide 82.4 03/05/2021: Hemoglobin 11.7; Platelets 236 07/02/2021: ALT 18; BUN 15; Creatinine, Ser 0.75; Potassium 4.4; Sodium 141 09/02/2021: TSH 0.81      Physical Exam:    VS:  BP 118/62   Pulse 82   Ht 5' (1.524 m)   Wt 187 lb (84.8 kg)   SpO2 95%   BMI 36.52 kg/m     Wt Readings from Last 3 Encounters:  10/26/21 187 lb (84.8 kg)  10/01/21 193 lb 3.2 oz (87.6 kg)  09/10/21 197 lb 9.6 oz  (89.6 kg)     GEN:  Well nourished, well developed in no acute distress CARDIAC: RRR, no murmurs, rubs, gallops RESPIRATORY:  Normal work of breathing MUSCULOSKELETAL: no edema    ASSESSMENT & PLAN:    Persistent atrial fibrillation:       S/p ablation x2. Rare episodes. We will replace loop recorder to continue monitoring AF. Secondary hypercoagulable state: CHADS2Vasc is 3 for sex, age, and probable CAD. Continue eliquis 65m PO BID Obstructive sleep apnea Obesity Coronary artery  disease CHFrEF          Medication Adjustments/Labs and Tests Ordered: Current medicines are reviewed at length with the patient today.  Concerns regarding medicines are outlined above.  Orders Placed This Encounter  Procedures   EKG 12-Lead   No orders of the defined types were placed in this encounter.    Signed, Melida Quitter, MD  10/26/2021 9:19 AM    [place]

## 2021-10-25 NOTE — H&P (View-Only) (Signed)
Cardiology Office Note:    Date:  10/26/2021   ID:  Sandra Ruiz, DOB Oct 01, 1945, MRN [id number]  PCP:  Caryl Ada   [place] [facility] Cardiologist:  Kirk Ruths, MD Electrophysiologist:  Melida Quitter, MD     Referring MD: [name], PA-C   Chief complaint: palpitations, lightheadedness  History of Present Illness:    Sandra Ruiz is a 76 y.o. female with a hx of sleep apnea hypertension, diabetes, obesity atrial fibrillation and has undergone ablation twice in [place].   She was diagnosed with atrial fibrillation in 2019.  She had recurrence and had cardioversion was scheduled for February 2022, but she spontaneously converted back to sinus rhythm.  She has an implantable loop recorder that detected an episode in January 2023.  This episode was associated with fatigue and shortness of breath.  She underwent cardioversion in March 2023.    Past Medical History:  Diagnosis Date   Chronic pain    Depression    Diabetes mellitus without complication (HCC)    GERD (gastroesophageal reflux disease)    History of TIA (transient ischemic attack)    Hyperlipidemia    Hyperopia of both eyes with astigmatism and presbyopia 12/21/2019   Hypertension    Lyme disease    Paroxysmal atrial fibrillation (HCC)    Sleep apnea    Thyroid disease     Past Surgical History:  Procedure Laterality Date   ATRIAL FIBRILLATION ABLATION     x2 in [place] N/A 03/05/2021   Procedure: CARDIOVERSION;  Surgeon: Freada Bergeron, MD;  Location: [facility] ENDOSCOPY;  Service: Cardiovascular;  Laterality: N/A;   CERVICAL SPINE SURGERY     implantable loop recorder placement  11/07/2017   MDT [id number] implanted in [place] for afib management by Dr Candiss Norse   LUMBAR SPINE SURGERY     TONSILLECTOMY AND ADENOIDECTOMY      Current Medications: Current Meds  Medication Sig   ACCU-CHEK GUIDE test strip TEST 1 - 2 TIMES DAILY    Accu-Chek Softclix Lancets lancets CHECK BLOOD SUGAR ONE TO TWO TIMES DAILY.   ALPRAZolam (XANAX) 0.25 MG tablet Take 1 tablet (0.25 mg total) by mouth 2 (two) times daily as needed for anxiety.   apixaban (ELIQUIS) 5 MG TABS tablet Take 1 tablet (5 mg total) by mouth 2 (two) times daily.   Blood Glucose Monitoring Suppl (ACCU-CHEK GUIDE ME) w/Device KIT Test 1-2 times daily   carvedilol (COREG) 25 MG tablet Take 1 tablet (25 mg total) by mouth 2 (two) times daily with a meal.   diltiazem (DILT-XR) 180 MG 24 hr capsule TAKE 1 CAPSULE BY MOUTH DAILY   DULoxetine (CYMBALTA) 60 MG capsule Take 1 capsule (60 mg total) by mouth 2 (two) times daily.   ezetimibe (ZETIA) 10 MG tablet TAKE 1 TABLET BY MOUTH DAILY   furosemide (LASIX) 20 MG tablet TAKE 0.5 TABLET BY MOUTH DAILY AS NEEDED SWELLING OR WEIGHT GAIN   gabapentin (NEURONTIN) 800 MG tablet TAKE 1 TABLET BY MOUTH 3 TIMES  DAILY   levothyroxine (SYNTHROID) 75 MCG tablet TAKE 1 TABLET EVERY DAY BEFORE BREAKFAST   lisinopril (ZESTRIL) 10 MG tablet TAKE 1 TABLET BY MOUTH DAILY   nitroGLYCERIN (NITROSTAT) 0.4 MG SL tablet Place 1 tablet (0.4 mg total) under the tongue every 5 (five) minutes as needed for chest pain.   omeprazole (PRILOSEC) 20 MG capsule TAKE 1 CAPSULE BY MOUTH DAILY  oxybutynin (DITROPAN-XL) 10 MG 24 hr tablet TAKE 1 TABLET BY MOUTH TWICE  DAILY   Plecanatide (TRULANCE) 3 MG TABS Take 1 tablet by mouth daily.   pravastatin (PRAVACHOL) 40 MG tablet TAKE 1 TABLET BY MOUTH IN THE  EVENING   tirzepatide (MOUNJARO) 7.5 MG/0.5ML Pen Inject 7.5 mg into the skin once a week.     Allergies:   Baclofen, Crestor [rosuvastatin], Metformin and related, Verapamil, Silicone, and Wellbutrin [bupropion]   Social History   Socioeconomic History   Marital status: Married    Spouse name: Not on file   Number of children: 5   Years of education: Not on file   Highest education level: Some college, no degree  Occupational History   Not on file   Tobacco Use   Smoking status: Former   Smokeless tobacco: Never   Tobacco comments:    Former smoker 11/12/2020  Substance and Sexual Activity   Alcohol use: Yes    Alcohol/week: 2.0 standard drinks of alcohol    Types: 2 Standard drinks or equivalent per week    Comment: mix drinks occ.   Drug use: Never   Sexual activity: Not on file  Other Topics Concern   Not on file  Social History Narrative   From [place], married, [place] Witness, new from [place] to [place]  07/2019   Right handed   Some caffeine use    Lives with husband, daughter, and son-in-law   Social Determinants of Health   Financial Resource Strain: Not on file  Food Insecurity: Not on file  Transportation Needs: Not on file  Physical Activity: Not on file  Stress: Not on file  Social Connections: Not on file     Family History: The patient's family history includes CAD in her father and mother; Hypothyroidism in her daughter; Thyroid disease in her daughter and mother.  ROS:   Please see the history of present illness.    All other systems reviewed and are negative.  EKGs/Labs/Other Studies Reviewed:     CT Coronary 09/22/2020 1. 3 vessel coronary calcium which is 40 th percentile for age/sex 2.  Normal aortic root 3.4 cm 3.  CAD RADS 1 non obstructive CAD see description above 4.  No PFO noted  TTE 06/21/2019 EF 65-70%. Grade I diastolic dysfunction. Normal LA size  EKG:  Last EKG results: Today: sinus rhythm with frequent PACs   Recent Labs: 11/12/2020: B Natriuretic Peptide 82.4 03/05/2021: Hemoglobin 11.7; Platelets 236 07/02/2021: ALT 18; BUN 15; Creatinine, Ser 0.75; Potassium 4.4; Sodium 141 09/02/2021: TSH 0.81      Physical Exam:    VS:  BP 118/62   Pulse 82   Ht 5' (1.524 m)   Wt 187 lb (84.8 kg)   SpO2 95%   BMI 36.52 kg/m     Wt Readings from Last 3 Encounters:  10/26/21 187 lb (84.8 kg)  10/01/21 193 lb 3.2 oz (87.6 kg)  09/10/21 197 lb 9.6 oz  (89.6 kg)     GEN:  Well nourished, well developed in no acute distress CARDIAC: RRR, no murmurs, rubs, gallops RESPIRATORY:  Normal work of breathing MUSCULOSKELETAL: no edema    ASSESSMENT & PLAN:    Persistent atrial fibrillation:       S/p ablation x2. Rare episodes. We will replace loop recorder to continue monitoring AF. Secondary hypercoagulable state: CHADS2Vasc is 3 for sex, age, and probable CAD. Continue eliquis 69m PO BID Obstructive sleep apnea Obesity Coronary artery  disease CHFrEF          Medication Adjustments/Labs and Tests Ordered: Current medicines are reviewed at length with the patient today.  Concerns regarding medicines are outlined above.  Orders Placed This Encounter  Procedures   EKG 12-Lead   No orders of the defined types were placed in this encounter.    Signed, Melida Quitter, MD  10/26/2021 9:19 AM    [place]

## 2021-10-26 ENCOUNTER — Ambulatory Visit: Payer: Medicare Other | Attending: Cardiovascular Disease | Admitting: Cardiovascular Disease

## 2021-10-26 ENCOUNTER — Encounter: Payer: Self-pay | Admitting: Cardiovascular Disease

## 2021-10-26 VITALS — BP 118/62 | HR 82 | Ht 60.0 in | Wt 187.0 lb

## 2021-10-26 DIAGNOSIS — I25118 Atherosclerotic heart disease of native coronary artery with other forms of angina pectoris: Secondary | ICD-10-CM

## 2021-10-26 DIAGNOSIS — I5032 Chronic diastolic (congestive) heart failure: Secondary | ICD-10-CM | POA: Diagnosis not present

## 2021-10-26 DIAGNOSIS — I4819 Other persistent atrial fibrillation: Secondary | ICD-10-CM

## 2021-10-26 NOTE — Patient Instructions (Signed)
Medication Instructions:  Your physician recommends that you continue on your current medications as directed. Please refer to the Current Medication list given to you today.  Labwork: None ordered.  Testing/Procedures: None ordered.   You are scheduled for a loop recorder explant and implant on November 3rd with Dr. Myles Gip. I will call and let you know what time to arrive at the hospital.    Implantable Loop Recorder Removal, Care After This sheet gives you information about how to care for yourself after your procedure. Your health care provider may also give you more specific instructions. If you have problems or questions, contact your health care provider. What can I expect after the procedure? After the procedure, it is common to have: Soreness or discomfort near the incision. Some swelling or bruising near the incision.  Follow these instructions at home: Incision care  Monitor your cardiac device site for redness, swelling, and drainage. Call the device clinic at [phone] if you experience these symptoms or fever/chills.  Keep the large square bandage on your site for 24 hours and then you may remove it yourself. Keep the steri-strips underneath in place.   You may shower after 72 hours / 3 days from your procedure with the steri-strips in place. They will usually fall off on their own, or may be removed after 10 days. Pat dry.   Avoid lotions, ointments, or perfumes over your incision until it is well-healed.  Please do not submerge in water until your site is completely healed.   If your wound site starts to bleed apply pressure.       If you have any questions/concerns please call the device clinic at [phone].  Activity  Return to your normal activities.  Contact a health care provider if: You have redness, swelling, or pain around your incision. You have a fever.    Implantable Loop Recorder Placement, Care After This sheet gives you information  about how to care for yourself after your procedure. Your health care provider may also give you more specific instructions. If you have problems or questions, contact your health care provider. What can I expect after the procedure? After the procedure, it is common to have: Soreness or discomfort near the incision. Some swelling or bruising near the incision.  Follow these instructions at home: Incision care  Monitor your cardiac device site for redness, swelling, and drainage. Call the device clinic at [phone] if you experience these symptoms or fever/chills.  Keep the large square bandage on your site for 24 hours and then you may remove it yourself. Keep the steri-strips underneath in place.   You may shower after 72 hours / 3 days from your procedure with the steri-strips in place. They will usually fall off on their own, or may be removed after 10 days. Pat dry.   Avoid lotions, ointments, or perfumes over your incision until it is well-healed.  Please do not submerge in water until your site is completely healed.   Your device is MRI compatible.   Remote monitoring is used to monitor your cardiac device from home. This monitoring is scheduled every month by our office. It allows [place] to keep an eye on the function of your device to ensure it is working properly.  If your wound site starts to bleed apply pressure.    For help with the monitor please call [employer] Monitor Support Specialist directly at [phone].    If you have any questions/concerns please call the device clinic at [phone].  Activity  Return to your normal activities.  General instructions Follow instructions from your health care provider about how to manage your implantable loop recorder and transmit the information. Learn how to activate a recording if this is necessary for your type of device. You may go through a metal detection gate, and you may let someone hold a metal detector over your  chest. Show your ID card if needed. Do not have an MRI unless you check with your health care provider first. Take over-the-counter and prescription medicines only as told by your health care provider. Keep all follow-up visits as told by your health care provider. This is important. Contact a health care provider if: You have redness, swelling, or pain around your incision. You have a fever. You have pain that is not relieved by your pain medicine. You have triggered your device because of fainting (syncope) or because of a heartbeat that feels like it is racing, slow, fluttering, or skipping (palpitations). Get help right away if you have: Chest pain. Difficulty breathing. Summary After the procedure, it is common to have soreness or discomfort near the incision. Change your dressing as told by your health care provider. Follow instructions from your health care provider about how to manage your implantable loop recorder and transmit the information. Keep all follow-up visits as told by your health care provider. This is important. This information is not intended to replace advice given to you by your health care provider. Make sure you discuss any questions you have with your health care provider. Document Released: 12/02/2014 Document Revised: 02/05/2017 Document Reviewed: 02/05/2017 Elsevier Patient Education  2020 [employer].

## 2021-10-28 ENCOUNTER — Telehealth: Payer: Self-pay | Admitting: Medical

## 2021-10-28 ENCOUNTER — Telehealth: Payer: Self-pay | Admitting: Family Medicine

## 2021-10-28 NOTE — Telephone Encounter (Signed)
Pt is finished with 7.5 Mounjaro and said you were going to send her 10 mg in and needs it sent to [facility] in [place]

## 2021-10-28 NOTE — Telephone Encounter (Signed)
Pt left message that she needs refill Mounjaro and that should be raised to 10 mg that y'all had discussed & send into [facility]

## 2021-10-29 ENCOUNTER — Other Ambulatory Visit: Payer: Self-pay | Admitting: Medical

## 2021-10-29 ENCOUNTER — Telehealth: Payer: Self-pay

## 2021-10-29 MED ORDER — MOUNJARO 10 MG/0.5ML ~~LOC~~ SOAJ: 10.0000 mg | SUBCUTANEOUS | 1 refills | Status: DC

## 2021-10-29 NOTE — Telephone Encounter (Signed)
Pt left a message on voicemail about having questions on co-pay with loop explant and reimplant. Her phone number is [phone].

## 2021-10-30 ENCOUNTER — Other Ambulatory Visit: Payer: Self-pay | Admitting: Medical

## 2021-10-30 ENCOUNTER — Ambulatory Visit (INDEPENDENT_AMBULATORY_CARE_PROVIDER_SITE_OTHER): Payer: Medicare Other

## 2021-10-30 VITALS — Ht 61.0 in | Wt 186.0 lb

## 2021-10-30 DIAGNOSIS — Z0000 Encounter for general adult medical examination without abnormal findings: Secondary | ICD-10-CM

## 2021-10-30 NOTE — Progress Notes (Signed)
I connected with Sandra Ruiz today by telephone and verified that I am speaking with the correct person using two identifiers. Location patient: home Location provider: work Persons participating in the virtual visit: Sandra Ruiz, Glenna Durand LPN.   I discussed the limitations, risks, security and privacy concerns of performing an evaluation and management service by telephone and the availability of in person appointments. I also discussed with the patient that there may be a patient responsible charge related to this service. The patient expressed understanding and verbally consented to this telephonic visit.    Interactive audio and video telecommunications were attempted between this provider and patient, however failed, due to patient having technical difficulties OR patient did not have access to video capability.  We continued and completed visit with audio only.     Vital signs may be patient reported or missing.  Subjective:   Sandra Ruiz is a 76 y.o. female who presents for Medicare Annual (Subsequent) preventive examination.  Review of Systems     Cardiac Risk Factors include: advanced age (>69mn, >>65women);diabetes mellitus;dyslipidemia;obesity (BMI >30kg/m2);hypertension     Objective:    Today's Vitals   10/30/21 1456 10/30/21 1457  Weight: 186 lb (84.4 kg)   Height: _0  (1.549 m)   PainSc:  5    Body mass index is 35.14 kg/m.     10/30/2021    3:07 PM 03/05/2021    9:45 AM 09/06/2020    5:57 PM 09/06/2020   12:26 PM  Advanced Directives  Does Patient Have a Medical Advance Directive? Yes Yes Yes Yes  Type of [place] Living will Living will Living will  Does patient want to make changes to medical advance directive?   No - Patient declined   Copy of [place] Chart? Yes - validated most recent copy scanned in chart (See row information)       Current Medications  (verified) Outpatient Encounter Medications as of 10/30/2021  Medication Sig   ACCU-CHEK GUIDE test strip TEST 1 - 2 TIMES DAILY   Accu-Chek Softclix Lancets lancets CHECK BLOOD SUGAR ONE TO TWO TIMES DAILY.   ALPRAZolam (XANAX) 0.25 MG tablet Take 1 tablet (0.25 mg total) by mouth 2 (two) times daily as needed for anxiety.   apixaban (ELIQUIS) 5 MG TABS tablet Take 1 tablet (5 mg total) by mouth 2 (two) times daily.   Blood Glucose Monitoring Suppl (ACCU-CHEK GUIDE ME) w/Device KIT Test 1-2 times daily   carvedilol (COREG) 25 MG tablet Take 1 tablet (25 mg total) by mouth 2 (two) times daily with a meal.   diltiazem (DILT-XR) 180 MG 24 hr capsule TAKE 1 CAPSULE BY MOUTH DAILY   DULoxetine (CYMBALTA) 60 MG capsule Take 1 capsule (60 mg total) by mouth 2 (two) times daily.   ezetimibe (ZETIA) 10 MG tablet TAKE 1 TABLET BY MOUTH DAILY   furosemide (LASIX) 20 MG tablet TAKE 0.5 TABLET BY MOUTH DAILY AS NEEDED SWELLING OR WEIGHT GAIN   gabapentin (NEURONTIN) 800 MG tablet TAKE 1 TABLET BY MOUTH 3 TIMES  DAILY   levothyroxine (SYNTHROID) 75 MCG tablet TAKE 1 TABLET EVERY DAY BEFORE BREAKFAST   lisinopril (ZESTRIL) 10 MG tablet TAKE 1 TABLET BY MOUTH DAILY   nitroGLYCERIN (NITROSTAT) 0.4 MG SL tablet Place 1 tablet (0.4 mg total) under the tongue every 5 (five) minutes as needed for chest pain.   omeprazole (PRILOSEC) 20 MG capsule TAKE 1 CAPSULE BY MOUTH DAILY  oxybutynin (DITROPAN-XL) 10 MG 24 hr tablet TAKE 1 TABLET BY MOUTH TWICE  DAILY   Plecanatide (TRULANCE) 3 MG TABS Take 1 tablet by mouth daily.   pravastatin (PRAVACHOL) 40 MG tablet TAKE 1 TABLET BY MOUTH IN THE  EVENING   tirzepatide (MOUNJARO) 10 MG/0.5ML Pen Inject 10 mg into the skin once a week.   No facility-administered encounter medications on file as of 10/30/2021.    Allergies (verified) Baclofen, Crestor [rosuvastatin], Metformin and related, Verapamil, Silicone, and Wellbutrin [bupropion]   History: Past Medical  History:  Diagnosis Date   Chronic pain    Depression    Diabetes mellitus without complication (HCC)    GERD (gastroesophageal reflux disease)    History of TIA (transient ischemic attack)    Hyperlipidemia    Hyperopia of both eyes with astigmatism and presbyopia 12/21/2019   Hypertension    Lyme disease    Paroxysmal atrial fibrillation (HCC)    Sleep apnea    Thyroid disease    Past Surgical History:  Procedure Laterality Date   ATRIAL FIBRILLATION ABLATION     x2 in [place] N/A 03/05/2021   Procedure: CARDIOVERSION;  Surgeon: Freada Bergeron, MD;  Location: [facility] ENDOSCOPY;  Service: Cardiovascular;  Laterality: N/A;   CERVICAL SPINE SURGERY     implantable loop recorder placement  11/07/2017   MDT [id number] implanted in [place] for afib management by Dr Margaretha Glassing SPINE SURGERY     TONSILLECTOMY AND ADENOIDECTOMY     Family History  Problem Relation Age of Onset   CAD Mother    Thyroid disease Mother    CAD Father    Thyroid disease Daughter    Hypothyroidism Daughter    Social History   Socioeconomic History   Marital status: Married    Spouse name: Not on file   Number of children: 5   Years of education: Not on file   Highest education level: Some college, no degree  Occupational History   Not on file  Tobacco Use   Smoking status: Former   Smokeless tobacco: Never   Tobacco comments:    Former smoker 11/12/2020  Vaping Use   Vaping Use: Never used  Substance and Sexual Activity   Alcohol use: Yes    Alcohol/week: 2.0 standard drinks of alcohol    Types: 2 Standard drinks or equivalent per week    Comment: mix drinks occ.   Drug use: Never   Sexual activity: Not on file  Other Topics Concern   Not on file  Social History Narrative   From [place], married, Tiro Witness, new from [place] to [place]  07/2019   Right handed   Some caffeine use    Lives with husband, daughter, and  son-in-law   Social Determinants of Health   Financial Resource Strain: [place]  (10/30/2021)   Overall Financial Resource Strain (CARDIA)    Difficulty of Paying Living Expenses: Not hard at all  Food Insecurity: No Food Insecurity (10/30/2021)   Hunger Vital Sign    Worried About Running Out of Food in the Last Year: Never true    [place] in the Last Year: Never true  Transportation Needs: No Transportation Needs (10/30/2021)   PRAPARE - Hydrologist (Medical): No    Lack of Transportation (Non-Medical): No  Physical Activity: Inactive (10/30/2021)   Exercise Vital Sign  Days of Exercise per Week: 0 days    Minutes of Exercise per Session: 0 min  Stress: Stress Concern Present (10/30/2021)   [place]    Feeling of Stress : To some extent  Social Connections: Not on file    Tobacco Counseling Counseling given: Not Answered Tobacco comments: Former smoker 11/12/2020   Clinical Intake:  Pre-visit preparation completed: Yes  Pain : 0-10 Pain Score: 5  Pain Type: Chronic pain Pain Location: Back Pain Orientation: Lower Pain Descriptors / Indicators: Aching Pain Onset: More than a month ago Pain Frequency: Constant     Nutritional Status: BMI > 30  Obese Nutritional Risks: None Diabetes: Yes  How often do you need to have someone help you when you read instructions, pamphlets, or other written materials from your doctor or pharmacy?: 1 - Never What is the last grade level you completed in school?: 33yrcollege  Diabetic? Yes Nutrition Risk Assessment:  Has the patient had any N/V/D within the last 2 months?  No  Does the patient have any non-healing wounds?  No  Has the patient had any unintentional weight loss or weight gain?  No   Diabetes:  Is the patient diabetic?  Yes  If diabetic, was a CBG obtained today?  No  Did the patient bring in their  glucometer from home?  No  How often do you monitor your CBG's? daily.   Financial Strains and Diabetes Management:  Are you having any financial strains with the device, your supplies or your medication? No .  Does the patient want to be seen by Chronic Care Management for management of their diabetes?  No  Would the patient like to be referred to a Nutritionist or for Diabetic Management?  No   Diabetic Exams:  Diabetic Eye Exam: Completed 03/13/2021 Diabetic Foot Exam: Overdue, Pt has been advised about the importance in completing this exam. Pt is scheduled for diabetic foot exam on next appointment.   Interpreter Needed?: No  Information entered by :: NAllen LPN   Activities of Daily Living    10/30/2021    3:13 PM  In your present state of health, do you have any difficulty performing the following activities:  Hearing? 0  Vision? 1  Difficulty concentrating or making decisions? 1  Walking or climbing stairs? 1  Dressing or bathing? 0  Doing errands, shopping? 0  Preparing Food and eating ? N  Using the Toilet? N  In the past six months, have you accidently leaked urine? Y  Do you have problems with loss of bowel control? N  Managing your Medications? N  Managing your Finances? N  Housekeeping or managing your Housekeeping? N    Patient Care Team: Tysinger, DCamelia Eng PA-C as PCP - General (Family Medicine) CStanford BreedBDenice Bors MD as PCP - Cardiology (Cardiology) Mealor, AYetta Barre MD as PCP - Electrophysiology (Cardiology) PViona Gilmore [facility] Health[facility] Pharmacist (Pharmacist)  Indicate any recent Medical Services you may have received from other than [facility] providers in the past year (date may be approximate).     Assessment:   This is a routine wellness examination for [place]  Hearing/Vision screen Vision Screening - Comments:: Regular eye exams, Dr. FBrenton Grills [place] Dietary issues and exercise activities discussed: Current Exercise Habits: The patient  does not participate in regular exercise at present   Goals Addressed             This Visit's  Progress    Patient Stated       10/30/2021, want to be more mobile       Depression Screen    10/30/2021    3:11 PM 10/01/2021   11:29 AM 04/08/2021    3:36 PM 11/26/2020   10:34 AM 08/26/2020   10:25 AM 05/01/2020    9:42 AM 05/02/2019   10:34 AM  PHQ 2/9 Scores  PHQ - 2 Score 0 _0 0  PHQ- 9 Score _1 Fall Risk    10/30/2021    3:08 PM 10/01/2021   11:29 AM 04/08/2021    3:35 PM 11/26/2020   10:34 AM 08/26/2020   10:24 AM  Fall Risk   Falls in the past year? _2 Comment fell out of bed      Number falls in past yr: 0 0 0 1 0  Injury with Fall? 0 0 0 0 1  Risk for fall due to : Impaired balance/gait;Impaired mobility;Medication side effect Impaired balance/gait Impaired balance/gait Impaired balance/gait Other (Comment)  Risk for fall due to: Comment     fell out of bed  Follow up Falls evaluation completed;Education provided;Falls prevention discussed Falls evaluation completed Falls evaluation completed Falls evaluation completed Falls evaluation completed    FALL RISK PREVENTION PERTAINING TO THE HOME:  Any stairs in or around the home? Yes  If so, are there any without handrails? No  Home free of loose throw rugs in walkways, pet beds, electrical cords, etc? Yes  Adequate lighting in your home to reduce risk of falls? Yes   ASSISTIVE DEVICES UTILIZED TO PREVENT FALLS:  Life alert? No  Use of a cane, walker or w/c? No  Grab bars in the bathroom? Yes  Shower chair or bench in shower? Yes  Elevated toilet seat or a handicapped toilet? Yes   TIMED UP AND GO:  Was the test performed? No .      Cognitive Function:        10/30/2021    3:15 PM  6CIT Screen  What Year? 0 points  What month? 0 points  What time? 3 points  Count back from 20 0 points  Months in reverse 0 points  Repeat phrase 2 points  Total Score 5 points     Immunizations Immunization History  Administered Date(s) Administered   Fluad Quad(high Dose 65+) 11/01/2019, 09/18/2020, 10/01/2021   Janssen (J&J) SARS-COV-2 Vaccination 03/18/2019   Moderna Sars-Covid-2 Vaccination 11/01/2019   Pneumococcal Conjugate-13 08/26/2020    TDAP status: Due, Education has been provided regarding the importance of this vaccine. Advised may receive this vaccine at local pharmacy or Health Dept. Aware to provide a copy of the vaccination record if obtained from local pharmacy or Health Dept. Verbalized acceptance and understanding.  Flu Vaccine status: Up to date  Pneumococcal vaccine status: Up to date  Covid-19 vaccine status: Completed vaccines  Qualifies for Shingles Vaccine? Yes   Zostavax completed No   Shingrix Completed?: No.    Education has been provided regarding the importance of this vaccine. Patient has been advised to call insurance company to determine out of pocket expense if they have not yet received this vaccine. Advised may also receive vaccine at local pharmacy or Health Dept. Verbalized acceptance and understanding.  Screening Tests Health Maintenance  Topic Date Due   Zoster Vaccines- Shingrix (1 of 2) Never done   COVID-19 Vaccine (3 -  Janssen risk series) 12/27/2019   FOOT EXAM  02/03/2021   Pneumonia Vaccine 63+ Years old (2 - PPSV23 or PCV20) 08/26/2021   Medicare Annual Wellness (AWV)  08/26/2021   TETANUS/TDAP  05/28/2022 (Originally 07/01/1964)   OPHTHALMOLOGY EXAM  03/14/2022   HEMOGLOBIN A1C  04/01/2022   Diabetic kidney evaluation - GFR measurement  07/03/2022   Diabetic kidney evaluation - Urine ACR  10/02/2022   MAMMOGRAM  06/18/2023   COLONOSCOPY (Pts 45-33yr Insurance coverage will need to be confirmed)  01/30/2031   INFLUENZA VACCINE  Completed   DEXA SCAN  Completed   Hepatitis C Screening  Completed   HPV VACCINES  Aged Out    Health Maintenance  Health Maintenance Due  Topic Date Due   Zoster  Vaccines- Shingrix (1 of 2) Never done   COVID-19 Vaccine (3 - Janssen risk series) 12/27/2019   FOOT EXAM  02/03/2021   Pneumonia Vaccine 74 Years old (2 - PPSV23 or PCV20) 08/26/2021   Medicare Annual Wellness (AWV)  08/26/2021    Colorectal cancer screening: Type of screening: Colonoscopy. Completed 01/29/2021. Repeat every 1 years  Mammogram status: Completed 06/17/2021. Repeat every year  Bone Density status: Completed 02/06/2020.   Lung Cancer Screening: (Low Dose CT Chest recommended if Age 74-80years, 30 pack-year currently smoking OR have quit w/in 15years.) does not qualify.   Lung Cancer Screening Referral: no  Additional Screening:  Hepatitis C Screening: does qualify; Completed 11/01/2019  Vision Screening: Recommended annual ophthalmology exams for early detection of glaucoma and other disorders of the eye. Is the patient up to date with their annual eye exam?  Yes  Who is the provider or what is the name of the office in which the patient attends annual eye exams? Dr. FBrenton GrillsIf pt is not established with a provider, would they like to be referred to a provider to establish care? No .   Dental Screening: Recommended annual dental exams for proper oral hygiene  Community Resource Referral / Chronic Care Management: CRR required this visit?  No   CCM required this visit?  No      Plan:     I have personally reviewed and noted the following in the patient's chart:   Medical and social history Use of alcohol, tobacco or illicit drugs  Current medications and supplements including opioid prescriptions. Patient is not currently taking opioid prescriptions. Functional ability and status Nutritional status Physical activity Advanced directives List of other physicians Hospitalizations, surgeries, and ER visits in previous 12 months Vitals Screenings to include cognitive, depression, and falls Referrals and appointments  In addition, I have reviewed and  discussed with patient certain preventive protocols, quality metrics, and best practice recommendations. A written personalized care plan for preventive services as well as general preventive health recommendations were provided to patient.     NKellie Simmering LPN   [id number]  Nurse Notes: none  Due to this being a virtual visit, the after visit summary with patients personalized plan was offered to patient via mail or my-chart.  to pick up at office at next visit

## 2021-10-30 NOTE — Patient Instructions (Signed)
Sandra Ruiz , Thank you for taking time to come for your Medicare Wellness Visit. I appreciate your ongoing commitment to your health goals. Please review the following plan we discussed and let me know if I can assist you in the future.   Screening recommendations/referrals: Colonoscopy: completed 01/29/2021 Mammogram: completed 06/17/2021, due 06/19/2022 Bone Density: completed 02/06/2020 Recommended yearly ophthalmology/optometry visit for glaucoma screening and checkup Recommended yearly dental visit for hygiene and checkup  Vaccinations: Influenza vaccine: completed 10/01/2021 Pneumococcal vaccine: completed 08/26/2020 Tdap vaccine: due Shingles vaccine: discussed   Covid-19: 11/01/2019, 03/18/2019  Advanced directives: copy in chart  Conditions/risks identified: none  Next appointment: Follow up in one year for your annual wellness visit    Preventive Care 74 Years and Older, Female Preventive care refers to lifestyle choices and visits with your health care provider that can promote health and wellness. What does preventive care include? A yearly physical exam. This is also called an annual well check. Dental exams once or twice a year. Routine eye exams. Ask your health care provider how often you should have your eyes checked. Personal lifestyle choices, including: Daily care of your teeth and gums. Regular physical activity. Eating a healthy diet. Avoiding tobacco and drug use. Limiting alcohol use. Practicing safe sex. Taking low-dose aspirin every day. Taking vitamin and mineral supplements as recommended by your health care provider. What happens during an annual well check? The services and screenings done by your health care provider during your annual well check will depend on your age, overall health, lifestyle risk factors, and family history of disease. Counseling  Your health care provider may ask you questions about your: Alcohol use. Tobacco use. Drug  use. Emotional well-being. Home and relationship well-being. Sexual activity. Eating habits. History of falls. Memory and ability to understand (cognition). Work and work Statistician. Reproductive health. Screening  You may have the following tests or measurements: Height, weight, and BMI. Blood pressure. Lipid and cholesterol levels. These may be checked every 5 years, or more frequently if you are over 45 years old. Skin check. Lung cancer screening. You may have this screening every year starting at age 74 if you have a 30-pack-year history of smoking and currently smoke or have quit within the past 15 years. Fecal occult blood test (FOBT) of the stool. You may have this test every year starting at age 74. Flexible sigmoidoscopy or colonoscopy. You may have a sigmoidoscopy every 5 years or a colonoscopy every 10 years starting at age 74. Hepatitis C blood test. Hepatitis B blood test. Sexually transmitted disease (STD) testing. Diabetes screening. This is done by checking your blood sugar (glucose) after you have not eaten for a while (fasting). You may have this done every 1-3 years. Bone density scan. This is done to screen for osteoporosis. You may have this done starting at age 33. Mammogram. This may be done every 1-2 years. Talk to your health care provider about how often you should have regular mammograms. Talk with your health care provider about your test results, treatment options, and if necessary, the need for more tests. Vaccines  Your health care provider may recommend certain vaccines, such as: Influenza vaccine. This is recommended every year. Tetanus, diphtheria, and acellular pertussis (Tdap, Td) vaccine. You may need a Td booster every 10 years. Zoster vaccine. You may need this after age 75. Pneumococcal 13-valent conjugate (PCV13) vaccine. One dose is recommended after age 60. Pneumococcal polysaccharide (PPSV23) vaccine. One dose is recommended after age  48.  Talk to your health care provider about which screenings and vaccines you need and how often you need them. This information is not intended to replace advice given to you by your health care provider. Make sure you discuss any questions you have with your health care provider. Document Released: 01/17/2015 Document Revised: 09/10/2015 Document Reviewed: 10/22/2014 Elsevier Interactive Patient Education  2017 [employer] Prevention in the Home Falls can cause injuries. They can happen to people of all ages. There are many things you can do to make your home safe and to help prevent falls. What can I do on the outside of my home? Regularly fix the edges of walkways and driveways and fix any cracks. Remove anything that might make you trip as you walk through a door, such as a raised step or threshold. Trim any bushes or trees on the path to your home. Use bright outdoor lighting. Clear any walking paths of anything that might make someone trip, such as rocks or tools. Regularly check to see if handrails are loose or broken. Make sure that both sides of any steps have handrails. Any raised decks and porches should have guardrails on the edges. Have any leaves, snow, or ice cleared regularly. Use sand or salt on walking paths during winter. Clean up any spills in your garage right away. This includes oil or grease spills. What can I do in the bathroom? Use night lights. Install grab bars by the toilet and in the tub and shower. Do not use towel bars as grab bars. Use non-skid mats or decals in the tub or shower. If you need to sit down in the shower, use a plastic, non-slip stool. Keep the floor dry. Clean up any water that spills on the floor as soon as it happens. Remove soap buildup in the tub or shower regularly. Attach bath mats securely with double-sided non-slip rug tape. Do not have throw rugs and other things on the floor that can make you trip. What can I do in the  bedroom? Use night lights. Make sure that you have a light by your bed that is easy to reach. Do not use any sheets or blankets that are too big for your bed. They should not hang down onto the floor. Have a firm chair that has side arms. You can use this for support while you get dressed. Do not have throw rugs and other things on the floor that can make you trip. What can I do in the kitchen? Clean up any spills right away. Avoid walking on wet floors. Keep items that you use a lot in easy-to-reach places. If you need to reach something above you, use a strong step stool that has a grab bar. Keep electrical cords out of the way. Do not use floor polish or wax that makes floors slippery. If you must use wax, use non-skid floor wax. Do not have throw rugs and other things on the floor that can make you trip. What can I do with my stairs? Do not leave any items on the stairs. Make sure that there are handrails on both sides of the stairs and use them. Fix handrails that are broken or loose. Make sure that handrails are as long as the stairways. Check any carpeting to make sure that it is firmly attached to the stairs. Fix any carpet that is loose or worn. Avoid having throw rugs at the top or bottom of the stairs. If you do have throw rugs,  attach them to the floor with carpet tape. Make sure that you have a light switch at the top of the stairs and the bottom of the stairs. If you do not have them, ask someone to add them for you. What else can I do to help prevent falls? Wear shoes that: Do not have high heels. Have rubber bottoms. Are comfortable and fit you well. Are closed at the toe. Do not wear sandals. If you use a stepladder: Make sure that it is fully opened. Do not climb a closed stepladder. Make sure that both sides of the stepladder are locked into place. Ask someone to hold it for you, if possible. Clearly mark and make sure that you can see: Any grab bars or  handrails. First and last steps. Where the edge of each step is. Use tools that help you move around (mobility aids) if they are needed. These include: Canes. Walkers. Scooters. Crutches. Turn on the lights when you go into a dark area. Replace any light bulbs as soon as they burn out. Set up your furniture so you have a clear path. Avoid moving your furniture around. If any of your floors are uneven, fix them. If there are any pets around you, be aware of where they are. Review your medicines with your doctor. Some medicines can make you feel dizzy. This can increase your chance of falling. Ask your doctor what other things that you can do to help prevent falls. This information is not intended to replace advice given to you by your health care provider. Make sure you discuss any questions you have with your health care provider. Document Released: 10/17/2008 Document Revised: 05/29/2015 Document Reviewed: 01/25/2014 Elsevier Interactive Patient Education  2017 [employer].

## 2021-10-30 NOTE — Telephone Encounter (Signed)
Called patient and provided verbal estimate and advised it had also been sent via My Chart.

## 2021-11-01 NOTE — Telephone Encounter (Signed)
done

## 2021-11-03 ENCOUNTER — Telehealth: Payer: Self-pay | Admitting: Cardiovascular Disease

## 2021-11-03 NOTE — Telephone Encounter (Signed)
Pt is requesting call back in regards to advice and questions she has prior to her procedure on 11/03 with Dr. Myles Gip. Requesting returning call.

## 2021-11-04 NOTE — Telephone Encounter (Signed)
Spoke with the patient and answered her questions in regards to her loop removal and insertion scheduled for 11/3

## 2021-11-06 ENCOUNTER — Encounter (HOSPITAL_COMMUNITY): Payer: Self-pay | Admitting: Cardiovascular Disease

## 2021-11-06 ENCOUNTER — Ambulatory Visit (HOSPITAL_COMMUNITY)
Admission: RE | Admit: 2021-11-06 | Discharge: 2021-11-06 | Disposition: A | Discharge status: Home / Self Care | Payer: Medicare Other | Attending: Cardiovascular Disease | Admitting: Cardiovascular Disease

## 2021-11-06 ENCOUNTER — Encounter (HOSPITAL_COMMUNITY)
Admission: RE | Disposition: A | Discharge status: Home / Self Care | Payer: Self-pay | Source: Home / Self Care | Attending: Cardiovascular Disease

## 2021-11-06 ENCOUNTER — Other Ambulatory Visit: Payer: Self-pay

## 2021-11-06 DIAGNOSIS — Z7901 Long term (current) use of anticoagulants: Secondary | ICD-10-CM | POA: Insufficient documentation

## 2021-11-06 DIAGNOSIS — I2510 Atherosclerotic heart disease of native coronary artery without angina pectoris: Secondary | ICD-10-CM | POA: Insufficient documentation

## 2021-11-06 DIAGNOSIS — Z87891 Personal history of nicotine dependence: Secondary | ICD-10-CM | POA: Diagnosis not present

## 2021-11-06 DIAGNOSIS — I5022 Chronic systolic (congestive) heart failure: Secondary | ICD-10-CM | POA: Insufficient documentation

## 2021-11-06 DIAGNOSIS — E119 Type 2 diabetes mellitus without complications: Secondary | ICD-10-CM | POA: Diagnosis not present

## 2021-11-06 DIAGNOSIS — I4819 Other persistent atrial fibrillation: Secondary | ICD-10-CM | POA: Diagnosis present

## 2021-11-06 DIAGNOSIS — E669 Obesity, unspecified: Secondary | ICD-10-CM | POA: Insufficient documentation

## 2021-11-06 DIAGNOSIS — Z7985 Long-term (current) use of injectable non-insulin antidiabetic drugs: Secondary | ICD-10-CM | POA: Insufficient documentation

## 2021-11-06 DIAGNOSIS — I110 Hypertensive heart disease with heart failure: Secondary | ICD-10-CM | POA: Diagnosis not present

## 2021-11-06 DIAGNOSIS — I4891 Unspecified atrial fibrillation: Secondary | ICD-10-CM

## 2021-11-06 DIAGNOSIS — G4733 Obstructive sleep apnea (adult) (pediatric): Secondary | ICD-10-CM | POA: Diagnosis not present

## 2021-11-06 DIAGNOSIS — D6869 Other thrombophilia: Secondary | ICD-10-CM | POA: Diagnosis not present

## 2021-11-06 DIAGNOSIS — Z6836 Body mass index (BMI) 36.0-36.9, adult: Secondary | ICD-10-CM | POA: Insufficient documentation

## 2021-11-06 HISTORY — PX: LOOP RECORDER REMOVAL: EP1215

## 2021-11-06 HISTORY — PX: LOOP RECORDER INSERTION: EP1214

## 2021-11-06 SURGERY — LOOP RECORDER INSERTION
Anesthesia: LOCAL

## 2021-11-06 MED ORDER — SODIUM CHLORIDE 0.9 % IV SOLN
INTRAVENOUS | Status: DC | PRN
  Administered 2021-11-06: 10 mL/h via INTRAVENOUS

## 2021-11-06 MED ORDER — LIDOCAINE-EPINEPHRINE 1 %-1:100000 IJ SOLN
INTRAMUSCULAR | Status: AC
  Filled 2021-11-06: qty 1

## 2021-11-06 MED ORDER — FENTANYL CITRATE (PF) 100 MCG/2ML IJ SOLN
INTRAMUSCULAR | Status: DC | PRN
  Administered 2021-11-06: 50 ug via INTRAVENOUS

## 2021-11-06 MED ORDER — FENTANYL CITRATE (PF) 100 MCG/2ML IJ SOLN
INTRAMUSCULAR | Status: AC
  Filled 2021-11-06: qty 2

## 2021-11-06 MED ORDER — MIDAZOLAM HCL 2 MG/2ML IJ SOLN
INTRAMUSCULAR | Status: AC
  Filled 2021-11-06: qty 2

## 2021-11-06 MED ORDER — LIDOCAINE-EPINEPHRINE 1 %-1:100000 IJ SOLN
INTRAMUSCULAR | Status: DC | PRN
  Administered 2021-11-06: 10 mL

## 2021-11-06 MED ORDER — MIDAZOLAM HCL 5 MG/5ML IJ SOLN
INTRAMUSCULAR | Status: DC | PRN
  Administered 2021-11-06: 1 mg via INTRAVENOUS

## 2021-11-06 SURGICAL SUPPLY — 2 items
PACK LOOP INSERTION (CUSTOM PROCEDURE TRAY) ×1 IMPLANT
SYSTEM MONITOR REVEAL LINQ II (Prosthesis & Implant Heart) IMPLANT

## 2021-11-06 NOTE — Interval H&P Note (Signed)
History and Physical Interval Note:  11/06/2021 10:10 AM  Sandra Ruiz  has presented today for surgery, with the diagnosis of atrial fibrillation.  The various methods of treatment have been discussed with the patient and family. After consideration of risks, benefits and other options for treatment, the patient has consented to  Procedure(s): LOOP RECORDER INSERTION (N/A) LOOP RECORDER REMOVAL (N/A) as a surgical intervention.  The patient's history has been reviewed, patient examined, no change in status, stable for surgery.  I have reviewed the patient's chart and labs.  Questions were answered to the patient's satisfaction.     Yetta Barre Makennah Omura

## 2021-11-06 NOTE — Progress Notes (Signed)
Patient and husband was given discharge instructions. Both verbalized understanding. 

## 2021-11-06 NOTE — Discharge Instructions (Signed)
Care After Your Loop Recorder  You have a [employer] Loop Recorder   Monitor your cardiac device site for redness, swelling, and drainage. Call the device clinic at [phone] if you experience these symptoms or fever/chills.  If you notice bleeding from your site, hold firm, but gently pressure with two fingers for 5 minutes. Dried blood on the steri-strips when removing the outer bandage is normal.   Keep the large square bandage on your site for 24 hours and then you may remove it yourself. Keep the steri-strips underneath in place.   You may shower after 72 hours / 3 days from your procedure with the steri-strips in place. They will usually fall off on their own, or may be removed after 10 days. Pat dry.   Avoid lotions, ointments, or perfumes over your incision until it is well-healed.  Please do not submerge in water until your site is completely healed.   Your device is MRI compatible.   Remote monitoring is used to monitor your cardiac device from home. This monitoring is scheduled every month by our office. It allows us to keep an eye on the function of your device to ensure it is working properly.    

## 2021-11-09 ENCOUNTER — Telehealth: Payer: Self-pay

## 2021-11-09 NOTE — Telephone Encounter (Signed)
  Loop Recorder Follow up   Is patient connected to [facility]? TBD, [place] working to correct name in system.   Have steri-strips fallen off or been removed? No   Does the patient need in office follow up? No   Please continue to monitor your cardiac device site for redness, swelling, and drainage. Call the device clinic at [phone] if you experience these symptoms, fever/chills, or have questions about your device.   Remote monitoring is used to monitor your cardiac device from home. This monitoring is scheduled every month by our office. It allows [place] to keep an eye on the functioning of your device to ensure it is working properly.

## 2021-11-09 NOTE — Telephone Encounter (Signed)
Patient called because her monitor was orange. I let her know it may have been because she was not into our system yet. [employer] did put her last name wrong in [place]. I emailed them so they could fix it. I told her to give it 24-48 hours and it should be working properly. If not to give [place] a call.   She asked what she should do if the steri strips fall off? I told her as long as the incision site is closed she should be fine. She could also put a bandage tight on the incision and she will be fine. The main thing is not to submerge it in water. The patient verbalized understanding and thanked me for my help.

## 2021-11-09 NOTE — Telephone Encounter (Signed)
-----   Message from Shirley Friar, [place] sent at 11/06/2021 12:02 PM EDT ----- Regarding: Same Day Discharge LOOP explant/ re-implant 11/06/21 Dr. Myles Gip

## 2021-11-17 ENCOUNTER — Other Ambulatory Visit: Payer: Self-pay | Admitting: Medical

## 2021-11-20 ENCOUNTER — Ambulatory Visit (HOSPITAL_COMMUNITY)
Admission: RE | Admit: 2021-11-20 | Discharge: 2021-11-20 | Disposition: A | Discharge status: Home / Self Care | Payer: Medicare Other | Source: Ambulatory Visit | Attending: Physician Assistant | Admitting: Physician Assistant

## 2021-11-20 VITALS — BP 116/62 | HR 91 | Ht 61.0 in | Wt 184.4 lb

## 2021-11-20 DIAGNOSIS — R42 Dizziness and giddiness: Secondary | ICD-10-CM | POA: Diagnosis not present

## 2021-11-20 DIAGNOSIS — I2510 Atherosclerotic heart disease of native coronary artery without angina pectoris: Secondary | ICD-10-CM | POA: Insufficient documentation

## 2021-11-20 DIAGNOSIS — G4733 Obstructive sleep apnea (adult) (pediatric): Secondary | ICD-10-CM | POA: Insufficient documentation

## 2021-11-20 DIAGNOSIS — Z79899 Other long term (current) drug therapy: Secondary | ICD-10-CM | POA: Insufficient documentation

## 2021-11-20 DIAGNOSIS — I110 Hypertensive heart disease with heart failure: Secondary | ICD-10-CM | POA: Insufficient documentation

## 2021-11-20 DIAGNOSIS — E119 Type 2 diabetes mellitus without complications: Secondary | ICD-10-CM | POA: Diagnosis not present

## 2021-11-20 DIAGNOSIS — Z7901 Long term (current) use of anticoagulants: Secondary | ICD-10-CM | POA: Insufficient documentation

## 2021-11-20 DIAGNOSIS — Z7985 Long-term (current) use of injectable non-insulin antidiabetic drugs: Secondary | ICD-10-CM | POA: Diagnosis not present

## 2021-11-20 DIAGNOSIS — D6869 Other thrombophilia: Secondary | ICD-10-CM | POA: Insufficient documentation

## 2021-11-20 DIAGNOSIS — I4819 Other persistent atrial fibrillation: Secondary | ICD-10-CM | POA: Insufficient documentation

## 2021-11-20 DIAGNOSIS — R55 Syncope and collapse: Secondary | ICD-10-CM | POA: Diagnosis not present

## 2021-11-20 DIAGNOSIS — E669 Obesity, unspecified: Secondary | ICD-10-CM | POA: Insufficient documentation

## 2021-11-20 DIAGNOSIS — Z6834 Body mass index (BMI) 34.0-34.9, adult: Secondary | ICD-10-CM | POA: Insufficient documentation

## 2021-11-20 DIAGNOSIS — I5032 Chronic diastolic (congestive) heart failure: Secondary | ICD-10-CM | POA: Diagnosis not present

## 2021-11-20 MED ORDER — DULOXETINE HCL 60 MG PO CPEP: 60.0000 mg | ORAL_CAPSULE | Freq: Every day | ORAL | Status: DC

## 2021-11-20 NOTE — Progress Notes (Signed)
Primary Care Physician: [name], PA-C Primary Cardiologist: [name] Primary Electrophysiologist: Dr Myles Gip Referring Physician: [name] is a 76 y.o. female with a history of atrial fibrillation s/p ablation x 2 in [place], HLD, OSA, HTN, DM who presents for follow up in the [facility]. The patient was initially diagnosed with atrial fibrillation in 2019 and had two ablations that year before moving here from [place]. Patient is on Eliquis for a CHADS2VASC score of 7. DCCV was arranged for 02/14/20 but she presented in [place].  The device clinic received an alert for an ongoing afib episode starting 01/31/21. She had noted more fatigue and SOB at that time. There were no specific triggers that she could identify. Patient is s/p DCCV on 03/05/21.   On follow up today, patient underwent ILR replacement on 11/06/21. ILR has shown no interim episodes of afib or any other arrhythmias/blocks. She has intermittent spells of dizziness/presyncope that have been chronic. There does not appear to be any trigger for these episodes.   Today, she denies symptoms of palpitations, chest pain, PND, syncope, bleeding, or neurologic sequela. The patient is tolerating medications without difficulties and is otherwise without complaint today.    Atrial Fibrillation Risk Factors:  she does have symptoms or diagnosis of sleep apnea. she is compliant with oral device. she does not have a history of rheumatic fever. she does not have a history of alcohol use. The patient does not have a history of early familial atrial fibrillation or other arrhythmias.  she has a BMI of Body mass index is 34.84 kg/m.Marland Kitchen Filed Weights   11/20/21 1017  Weight: 83.6 kg    Family History  Problem Relation Age of Onset   CAD Mother    Thyroid disease Mother    CAD Father    Thyroid disease Daughter    Hypothyroidism Daughter      Atrial Fibrillation Management  history:  Previous antiarrhythmic drugs: none Previous cardioversions: none Previous ablations: 2019 x 2 in [place] score: 7 Anticoagulation history: Eliquis   Past Medical History:  Diagnosis Date   Chronic pain    Depression    Diabetes mellitus without complication (HCC)    GERD (gastroesophageal reflux disease)    History of TIA (transient ischemic attack)    Hyperlipidemia    Hyperopia of both eyes with astigmatism and presbyopia 12/21/2019   Hypertension    Lyme disease    Paroxysmal atrial fibrillation (HCC)    Sleep apnea    Thyroid disease    Past Surgical History:  Procedure Laterality Date   ATRIAL FIBRILLATION ABLATION     x2 in [place] N/A 03/05/2021   Procedure: CARDIOVERSION;  Surgeon: Freada Bergeron, MD;  Location[facility] ENDOSCOPY;  Service: Cardiovascular;  Laterality: N/A;   CERVICAL SPINE SURGERY     implantable loop recorder placement  11/07/2017   MDT LINQ1 implanted in [place] for afib management by Dr Candiss Norse   LOOP RECORDER INSERTION N/A 11/06/2021   Procedure: LOOP RECORDER INSERTION;  Surgeon: Melida Quitter, MD;  Location: [facility] CV LAB;  Service: Cardiovascular;  Laterality: N/A;   LOOP RECORDER REMOVAL N/A 11/06/2021   Procedure: LOOP RECORDER REMOVAL;  Surgeon: Melida Quitter, MD;  Location: [facility] CV LAB;  Service: Cardiovascular;  Laterality: N/A;   LUMBAR SPINE SURGERY     TONSILLECTOMY AND ADENOIDECTOMY  Current Outpatient Medications  Medication Sig Dispense Refill   ACCU-CHEK GUIDE test strip TEST 1 - 2 TIMES DAILY 200 strip 0   Accu-Chek Softclix Lancets lancets CHECK BLOOD SUGAR ONE TO TWO TIMES DAILY. 200 each 2   ALPRAZolam (XANAX) 0.25 MG tablet Take 1 tablet (0.25 mg total) by mouth 2 (two) times daily as needed for anxiety. 20 tablet 0   apixaban (ELIQUIS) 5 MG TABS tablet Take 1 tablet (5 mg total) by mouth 2 (two) times daily. 200 tablet 3   Blood Glucose  Monitoring Suppl (ACCU-CHEK GUIDE ME) w/Device KIT Test 1-2 times daily 1 kit 0   carvedilol (COREG) 25 MG tablet Take 1 tablet (25 mg total) by mouth 2 (two) times daily with a meal. 200 tablet 3   diltiazem (DILT-XR) 180 MG 24 hr capsule TAKE 1 CAPSULE BY MOUTH DAILY 100 capsule 1   ezetimibe (ZETIA) 10 MG tablet TAKE 1 TABLET BY MOUTH DAILY 100 tablet 2   furosemide (LASIX) 20 MG tablet TAKE 0.5 TABLET BY MOUTH DAILY AS NEEDED SWELLING OR WEIGHT GAIN 90 tablet 0   gabapentin (NEURONTIN) 800 MG tablet TAKE 1 TABLET BY MOUTH 3 TIMES  DAILY 270 tablet 3   levothyroxine (SYNTHROID) 75 MCG tablet TAKE 1 TABLET BY MOUTH DAILY  BEFORE BREAKFAST 90 tablet 0   lisinopril (ZESTRIL) 10 MG tablet TAKE 1 TABLET BY MOUTH DAILY 100 tablet 2   nitroGLYCERIN (NITROSTAT) 0.4 MG SL tablet Place 1 tablet (0.4 mg total) under the tongue every 5 (five) minutes as needed for chest pain. 30 tablet 0   omeprazole (PRILOSEC) 20 MG capsule TAKE 1 CAPSULE BY MOUTH DAILY 30 capsule 5   oxybutynin (DITROPAN-XL) 10 MG 24 hr tablet TAKE 1 TABLET BY MOUTH TWICE  DAILY 200 tablet 2   Plecanatide (TRULANCE) 3 MG TABS Take 1 tablet by mouth daily. 6 tablet 0   pravastatin (PRAVACHOL) 40 MG tablet TAKE 1 TABLET BY MOUTH IN THE  EVENING 100 tablet 2   Tetrahydrozoline HCl ([facility] EYE DROPS OP) Place 1 drop into both eyes in the morning, at noon, in the evening, and at bedtime. Dry eyes     tirzepatide (MOUNJARO) 10 MG/0.5ML Pen Inject 10 mg into the skin once a week. 6 mL 1   DULoxetine (CYMBALTA) 60 MG capsule Take 1 capsule (60 mg total) by mouth at bedtime.     No current facility-administered medications for this encounter.    Allergies  Allergen Reactions   Baclofen Swelling   Crestor [Rosuvastatin]     Extreme myalgias   Metformin And Related Nausea Only   Verapamil Swelling   Silicone Itching and Rash   Wellbutrin [Bupropion] Anxiety    Hallucinations     Social History   Socioeconomic History   Marital status:  Married    Spouse name: Not on file   Number of children: 5   Years of education: Not on file   Highest education level: Some college, no degree  Occupational History   Not on file  Tobacco Use   Smoking status: Former   Smokeless tobacco: Never   Tobacco comments:    Former smoker 11/12/2020  Vaping Use   Vaping Use: Never used  Substance and Sexual Activity   Alcohol use: Yes    Alcohol/week: 2.0 standard drinks of alcohol    Types: 2 Standard drinks or equivalent per week    Comment: mix drinks occ.   Drug use: Never   Sexual activity: Not  on file  Other Topics Concern   Not on file  Social History Narrative   From [place], married, [place] Witness, new from [place] to [place]  07/2019   Right handed   Some caffeine use    Lives with husband, daughter, and son-in-law   Social Determinants of Health   Financial Resource Strain: [place]  (10/30/2021)   Overall Financial Resource Strain (CARDIA)    Difficulty of Paying Living Expenses: Not hard at all  Food Insecurity: No Food Insecurity (10/30/2021)   Hunger Vital Sign    Worried About Running Out of Food in the Last Year: Never true    [place] in the Last Year: Never true  Transportation Needs: No Transportation Needs (10/30/2021)   PRAPARE - Hydrologist (Medical): No    Lack of Transportation (Non-Medical): No  Physical Activity: Inactive (10/30/2021)   Exercise Vital Sign    Days of Exercise per Week: 0 days    Minutes of Exercise per Session: 0 min  Stress: Stress Concern Present (10/30/2021)   [place]    Feeling of Stress : To some extent  Social Connections: Not on file  Intimate Partner Violence: Not on file     ROS- All systems are reviewed and negative except as per the HPI above.  Physical Exam: Vitals:   11/20/21 1017  BP: 116/62  Pulse: 91  Weight: 83.6 kg  Height:  _0  (1.549 m)     GEN- The patient is a well appearing female, alert and oriented x 3 today.   HEENT-head normocephalic, atraumatic, sclera clear, conjunctiva pink, hearing intact, trachea midline. Lungs- Clear to ausculation bilaterally, normal work of breathing Heart- Regular rate and rhythm, no murmurs, rubs or gallops  GI- soft, NT, ND, + BS Extremities- no clubbing, cyanosis, or edema MS- no significant deformity or atrophy Skin- no rash or lesion Psych- euthymic mood, full affect Neuro- strength and sensation are intact   Wt Readings from Last 3 Encounters:  11/20/21 83.6 kg  11/06/21 84.4 kg  10/30/21 84.4 kg    EKG today demonstrates  SR Vent. rate 91 BPM PR interval 192 ms QRS duration 82 ms QT/QTcB 340/418 ms  Echo 09/07/20 demonstrated   1. Left ventricular ejection fraction, by estimation, is 60 to 65%. The left ventricle has normal function. The left ventricle has no regional wall motion abnormalities. Left ventricular diastolic parameters are consistent with Grade II diastolic dysfunction (pseudonormalization).   2. Right ventricular systolic function is normal. The right ventricular  size is normal. There is normal pulmonary artery systolic pressure.   3. Left atrial size was mildly dilated.   4. The mitral valve is normal in structure. No evidence of mitral valve regurgitation. No evidence of mitral stenosis.   5. The aortic valve is normal in structure. Aortic valve regurgitation is not visualized. Mild aortic valve sclerosis is present, with no evidence of aortic valve stenosis.   6. The inferior vena cava is normal in size with greater than 50%  respiratory variability, suggesting right atrial pressure of 3 mmHg.   Comparison(s): No significant change from prior study. Prior images reviewed side by side.   Epic records are reviewed at length today  CHA2DS2-VASc Score = 8  The patient's score is based upon: CHF History: 1 HTN History: 1 Diabetes  History: 1 Stroke History: 2 Vascular Disease History: 1 Age Score: 1  Gender Score: 1        ASSESSMENT AND PLAN: 1. Persistent Atrial Fibrillation (ICD10:  I48.19) The patient's CHA2DS2-VASc score is 8, indicating a 10.8% annual risk of stroke.   S/p afib ablation in [place] x 2 2019. ILR replacement 11/06/21 which shows 0% afib burden.  Continue Coreg 25 mg BID Continue diltiazem 180 mg daily  Continue Eliquis 5 mg BID  2. Secondary Hypercoagulable State (ICD10:  D68.69) The patient is at significant risk for stroke/thromboembolism based upon her CHA2DS2-VASc Score of 8.  Continue Apixaban (Eliquis).   3. Obesity Body mass index is 34.84 kg/m. Lifestyle modification was discussed and encouraged including regular physical activity and weight reduction. Doing well on Mounjaro.   4. OSA Encouraged compliance with oral device.  5. HTN Stable, no changes today.  6. CAD Calcium score 111, 67th percentile No anginal symptoms.  7. Chronic diastolic CHF Appears euvolemic today.  8. Dizziness/presyncope Does not appear to be arrhythmogenic. BP stable, no related to position change.  She has an established neurologist, she plans to reach out for an appointment.      Follow up with [name] as scheduled. AF clinic as scheduled.    [facility] [address] [place], [place] [address] [phone] 11/20/2021 11:26 AM

## 2021-12-14 ENCOUNTER — Ambulatory Visit (INDEPENDENT_AMBULATORY_CARE_PROVIDER_SITE_OTHER): Payer: Medicare Other

## 2021-12-14 DIAGNOSIS — I4819 Other persistent atrial fibrillation: Secondary | ICD-10-CM | POA: Diagnosis not present

## 2021-12-14 LAB — CUP PACEART REMOTE DEVICE CHECK
Date Time Interrogation Session: 20231210232231
Implantable Pulse Generator Implant Date: 20231103

## 2021-12-14 NOTE — Progress Notes (Signed)
HPI: FU atrial fibrillation. She has a history of paroxysmal atrial fibrillation and has had previous ablation in [place]. Carotid Dopplers February 2018 showed no significant stenosis. Transesophageal echocardiogram January 2020 in [place] showed normal LV function, mild mitral regurgitation and small secundum ASD.  Had implantable loop placed previously.  Cardiac CTA September 2022 showed calcium score 111 which was 67 percentile and minimal nonobstructive coronary artery disease.  Echocardiogram September 2022 showed normal LV function, grade 2 diastolic dysfunction, mild left atrial enlargement.  ABIs December 2022 moderate bilaterally.  Last cardioversion March 2023.  Since last seen she notes dyspnea.  This can occur both with activities and at rest.  She denies pedal edema, chest pain or syncope.  Current Outpatient Medications  Medication Sig Dispense Refill   ACCU-CHEK GUIDE test strip TEST 1 - 2 TIMES DAILY 200 strip 0   Accu-Chek Softclix Lancets lancets CHECK BLOOD SUGAR ONE TO TWO TIMES DAILY. 200 each 2   ALPRAZolam (XANAX) 0.25 MG tablet Take 1 tablet (0.25 mg total) by mouth 2 (two) times daily as needed for anxiety. 20 tablet 0   apixaban (ELIQUIS) 5 MG TABS tablet Take 1 tablet (5 mg total) by mouth 2 (two) times daily. 200 tablet 3   Blood Glucose Monitoring Suppl (ACCU-CHEK GUIDE ME) w/Device KIT Test 1-2 times daily 1 kit 0   carvedilol (COREG) 25 MG tablet Take 1 tablet (25 mg total) by mouth 2 (two) times daily with a meal. 200 tablet 3   diltiazem (DILT-XR) 180 MG 24 hr capsule TAKE 1 CAPSULE BY MOUTH DAILY 100 capsule 1   DULoxetine (CYMBALTA) 60 MG capsule Take 1 capsule (60 mg total) by mouth at bedtime.     ezetimibe (ZETIA) 10 MG tablet TAKE 1 TABLET BY MOUTH DAILY 100 tablet 2   furosemide (LASIX) 20 MG tablet TAKE 0.5 TABLET BY MOUTH DAILY AS NEEDED SWELLING OR WEIGHT GAIN 90 tablet 0   gabapentin (NEURONTIN) 800 MG tablet TAKE 1 TABLET BY MOUTH 3 TIMES   DAILY 270 tablet 3   levothyroxine (SYNTHROID) 75 MCG tablet TAKE 1 TABLET BY MOUTH DAILY  BEFORE BREAKFAST 90 tablet 0   lisinopril (ZESTRIL) 10 MG tablet TAKE 1 TABLET BY MOUTH DAILY 100 tablet 2   nitroGLYCERIN (NITROSTAT) 0.4 MG SL tablet Place 1 tablet (0.4 mg total) under the tongue every 5 (five) minutes as needed for chest pain. 30 tablet 0   omeprazole (PRILOSEC) 20 MG capsule TAKE 1 CAPSULE BY MOUTH DAILY 30 capsule 5   oxybutynin (DITROPAN-XL) 10 MG 24 hr tablet TAKE 1 TABLET BY MOUTH TWICE  DAILY 200 tablet 2   Plecanatide (TRULANCE) 3 MG TABS Take 1 tablet by mouth daily. 6 tablet 0   pravastatin (PRAVACHOL) 40 MG tablet TAKE 1 TABLET BY MOUTH IN THE  EVENING 100 tablet 2   Tetrahydrozoline HCl ([facility] EYE DROPS OP) Place 1 drop into both eyes in the morning, at noon, in the evening, and at bedtime. Dry eyes     tirzepatide (MOUNJARO) 10 MG/0.5ML Pen Inject 10 mg into the skin once a week. 6 mL 1   No current facility-administered medications for this visit.     Past Medical History:  Diagnosis Date   Chronic pain    Depression    Diabetes mellitus without complication (HCC)    GERD (gastroesophageal reflux disease)    History of TIA (transient ischemic attack)    Hyperlipidemia    Hyperopia of both eyes  with astigmatism and presbyopia 12/21/2019   Hypertension    Lyme disease    Paroxysmal atrial fibrillation ([place])    Sleep apnea    Thyroid disease     Past Surgical History:  Procedure Laterality Date   ATRIAL FIBRILLATION ABLATION     x2 in [place] N/A 03/05/2021   Procedure: CARDIOVERSION;  Surgeon: Freada Bergeron, MD;  Location: [facility] ENDOSCOPY;  Service: Cardiovascular;  Laterality: N/A;   CERVICAL SPINE SURGERY     implantable loop recorder placement  11/07/2017   MDT LINQ1 implanted in [place] for afib management by Dr Candiss Norse   LOOP RECORDER INSERTION N/A 11/06/2021   Procedure: LOOP RECORDER INSERTION;  Surgeon: Melida Quitter, MD;  Location: [place] CV LAB;  Service: Cardiovascular;  Laterality: N/A;   LOOP RECORDER REMOVAL N/A 11/06/2021   Procedure: LOOP RECORDER REMOVAL;  Surgeon: Melida Quitter, MD;  Location: [address] CV LAB;  Service: Cardiovascular;  Laterality: N/A;   LUMBAR SPINE SURGERY     TONSILLECTOMY AND ADENOIDECTOMY      Social History   Socioeconomic History   Marital status: Married    Spouse name: Not on file   Number of children: 5   Years of education: Not on file   Highest education level: Some college, no degree  Occupational History   Not on file  Tobacco Use   Smoking status: Former   Smokeless tobacco: Never   Tobacco comments:    Former smoker 11/12/2020  Vaping Use   Vaping Use: Never used  Substance and Sexual Activity   Alcohol use: Yes    Alcohol/week: 2.0 standard drinks of alcohol    Types: 2 Standard drinks or equivalent per week    Comment: mix drinks occ.   Drug use: Never   Sexual activity: Not on file  Other Topics Concern   Not on file  Social History Narrative   From [place], married, Martin Witness, new from [place] to [place]  07/2019   Right handed   Some caffeine use    Lives with husband, daughter, and son-in-law   Social Determinants of Health   Financial Resource Strain: [place]  (10/30/2021)   Overall Financial Resource Strain (CARDIA)    Difficulty of Paying Living Expenses: Not hard at all  Food Insecurity: No Food Insecurity (10/30/2021)   Hunger Vital Sign    Worried About Running Out of Food in the Last Year: Never true    [place] in the Last Year: Never true  Transportation Needs: No Transportation Needs (10/30/2021)   PRAPARE - Hydrologist (Medical): No    Lack of Transportation (Non-Medical): No  Physical Activity: Inactive (10/30/2021)   Exercise Vital Sign    Days of Exercise per Week: 0 days    Minutes of Exercise per Session: 0 min  Stress:  Stress Concern Present (10/30/2021)   [place]    Feeling of Stress : To some extent  Social Connections: Not on file  Intimate Partner Violence: Not on file    Family History  Problem Relation Age of Onset   CAD Mother    Thyroid disease Mother    CAD Father    Thyroid disease Daughter    Hypothyroidism Daughter     ROS: Back pain but no fevers or chills, productive cough, hemoptysis, dysphasia, odynophagia, melena,  hematochezia, dysuria, hematuria, rash, seizure activity, orthopnea, PND, pedal edema, claudication. Remaining systems are negative.  Physical Exam: Well-developed obese in no acute distress.  Skin is warm and dry.  HEENT is normal.  Neck is supple.  Chest is clear to auscultation with normal expansion.  Cardiovascular exam is regular rate and rhythm.  Abdominal exam nontender or distended. No masses palpated. Extremities show no edema. neuro grossly intact  A/P  1 paroxysmal atrial fibrillation-patient remains in sinus rhythm on exam.  Continue Cardizem, carvedilol and apixaban.  She is also monitored with an implantable loop recorder for atrial fibrillation burden.  2 hypertension-patient's blood pressure is controlled.  Continue present medical regimen.  3 hyperlipidemia-continue pravastatin and Zetia.  She did not tolerate Crestor or Lipitor previously.  4 small secundum ASD-previous echocardiogram did not show RV dilatation.  Will repeat.  5 nonobstructive coronary disease-continue statin.  She is not on aspirin given need for anticoagulation.  6 chest pain-longstanding but no recent episodes.  Most recent cardiac CTA showed minimal nonobstructive coronary disease.    7 chronic diastolic congestive heart failure-euvolemic.  Continue Lasix as needed.  8 obstructive sleep apnea-patient uses an oral device.  Kirk Ruths, MD

## 2021-12-21 IMAGING — MR MR HEAD W/O CM
10 series · 48 of 48 positions shown · non-contrast
Comparison: None.

CLINICAL DATA: Diplopia headache, diplopia, vision changes, chronic
pain.

EXAM:
MRI HEAD WITHOUT CONTRAST
TECHNIQUE: Multiplanar, multiecho pulse sequences of the brain and surrounding
structures were obtained without intravenous contrast.

[Series 2: T1 · sagittal · 5.0mm · 0.45mm/px · 2 of 20 slices shown]
[im 1/20]
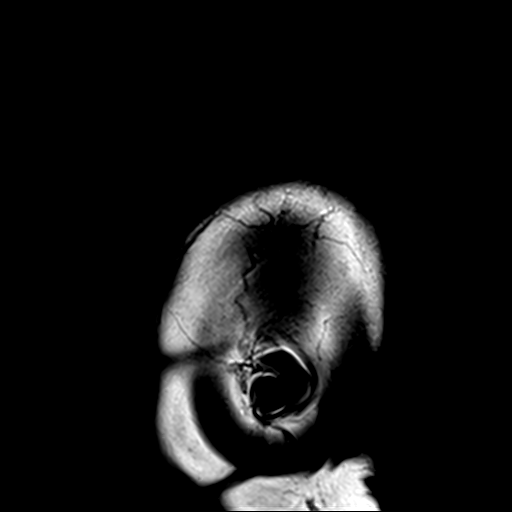
[im 20/20]
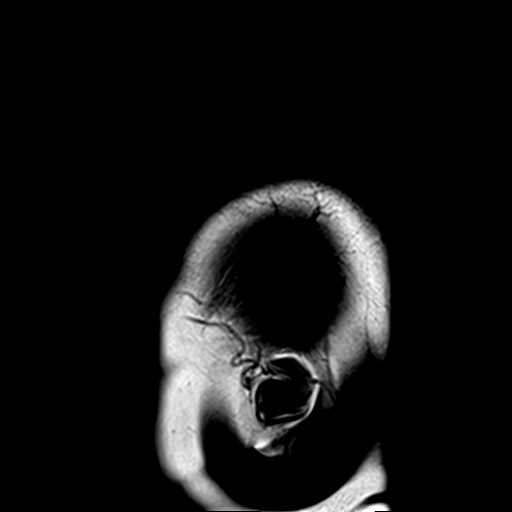

[Series 3: DWI · axial · 3.0mm · 1.80mm/px · z∈[-53,+94]mm · 9 of 100 slices shown (1 of 4)]
[im 1/100]
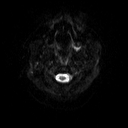
[im 13/100]
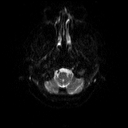
[im 25/100]
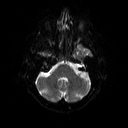
[im 38/100]
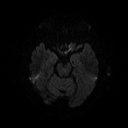
[im 50/100]
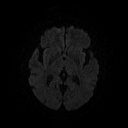
[im 62/100]
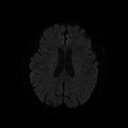
[im 75/100]
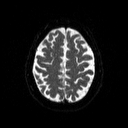
[im 87/100]
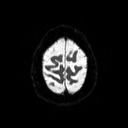
[im 100/100]
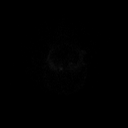

[Series 4: DWI · axial · 3.0mm · 1.80mm/px · z∈[-53,+94]mm · 5 of 50 slices shown (2 of 4)]
[im 1/50]
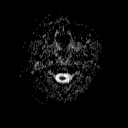
[im 13/50]
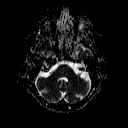
[im 25/50]
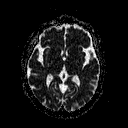
[im 37/50]
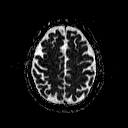
[im 50/50]
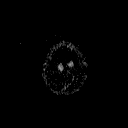

[Series 5: DWI · coronal · 5.0mm · 1.80mm/px · 6 of 68 slices shown (3 of 4)]
[im 1/68]
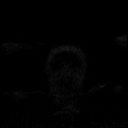
[im 14/68]
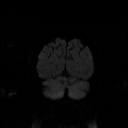
[im 27/68]
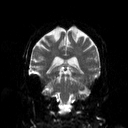
[im 41/68]
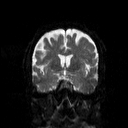
[im 54/68]
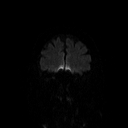
[im 68/68]
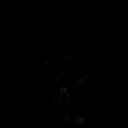

[Series 6: DWI · coronal · 5.0mm · 1.80mm/px · 3 of 34 slices shown (4 of 4)]
[im 1/34]
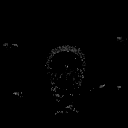
[im 17/34]
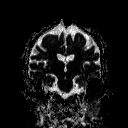
[im 34/34]
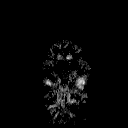

[Series 7: T2 · axial · 5.0mm · 0.51mm/px · z∈[-54,+93]mm · 2 of 22 slices shown (1 of 2)]
[im 1/22]
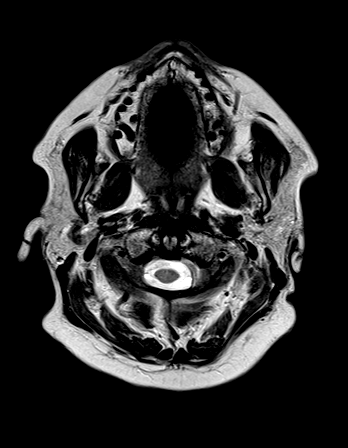
[im 22/22]
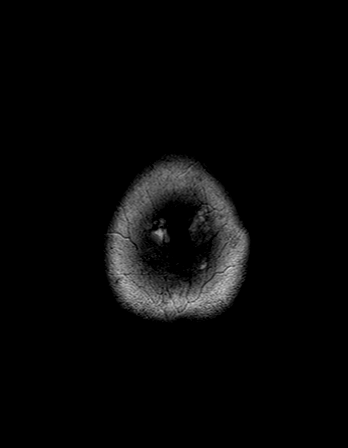

[Series 8: FLAIR · axial · 3.0mm · 0.45mm/px · z∈[-51,+93]mm · 3 of 32 slices shown]
[im 1/32]
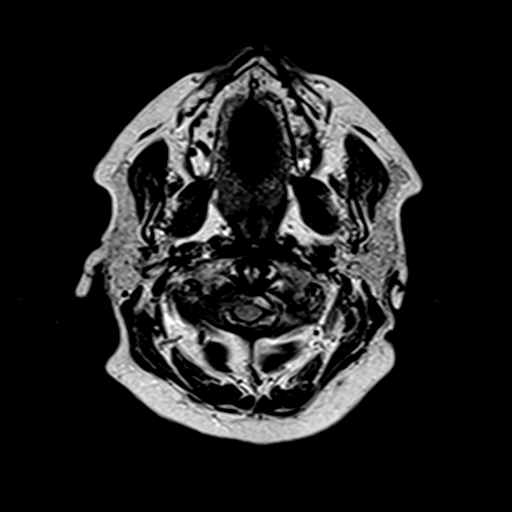
[im 16/32]
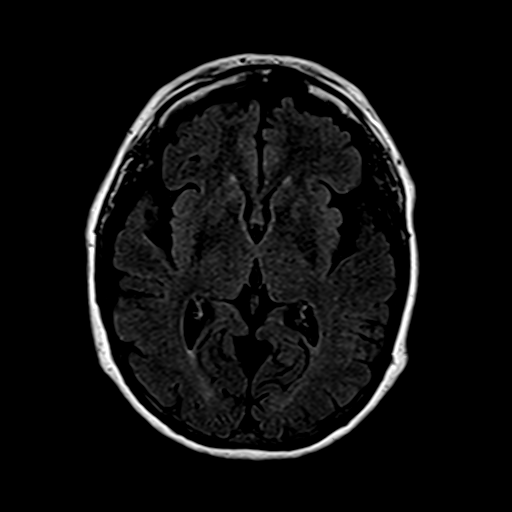
[im 32/32]
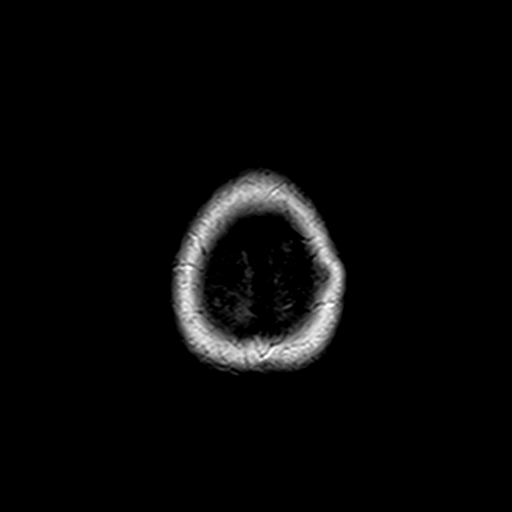

[Series 10: swi_images · axial · 4.0mm · 0.90mm/px · z∈[-49,+90]mm · 3 of 36 slices shown]
[im 1/36]
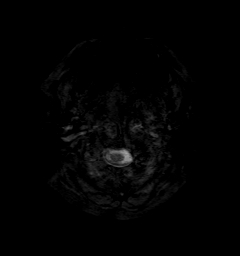
[im 18/36]
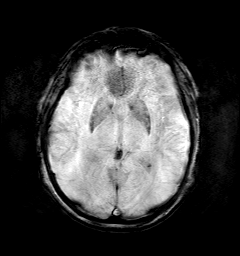
[im 36/36]
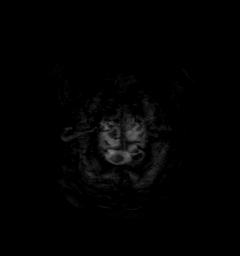

[Series 11: t1_mpr_tra · axial · 1.0mm · 0.71mm/px · z∈[-52,+91]mm · 13 of 144 slices shown]
[im 1/144]
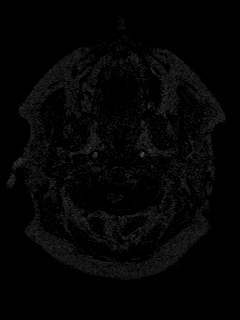
[im 12/144]
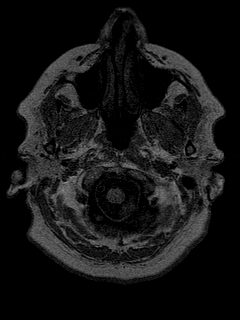
[im 24/144]
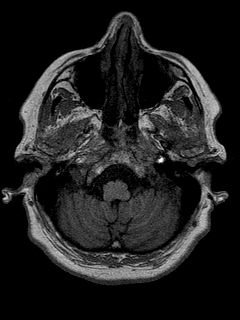
[im 36/144]
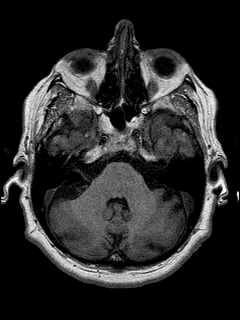
[im 48/144]
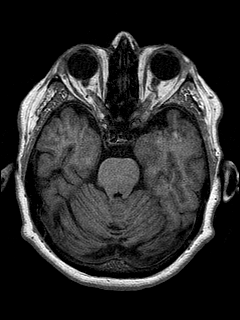
[im 60/144]
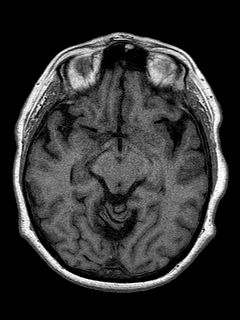
[im 72/144]
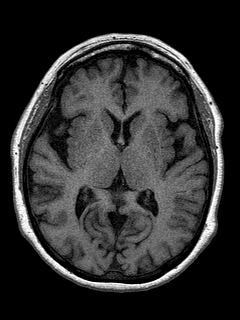
[im 84/144]
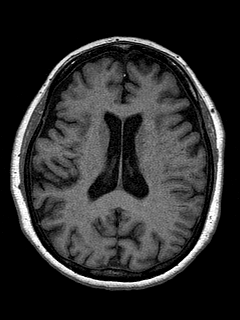
[im 96/144]
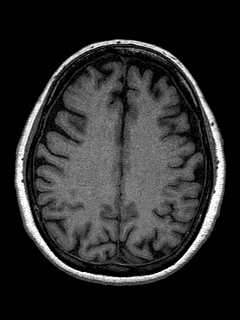
[im 108/144]
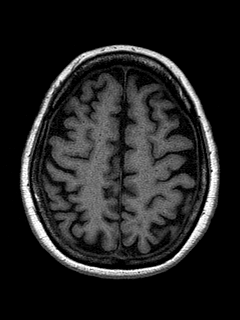
[im 120/144]
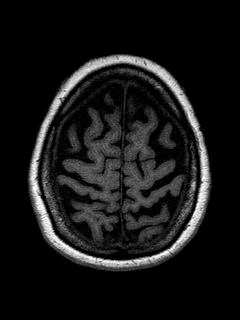
[im 132/144]
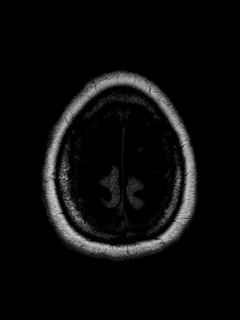
[im 144/144]
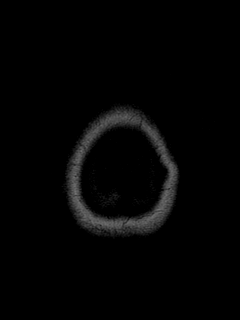

[Series 12: T2 · coronal · 5.0mm · 0.45mm/px · 2 of 25 slices shown (2 of 2)]
[im 1/25]
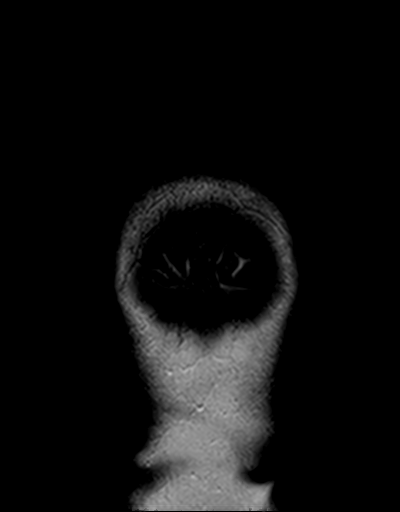
[im 25/25]
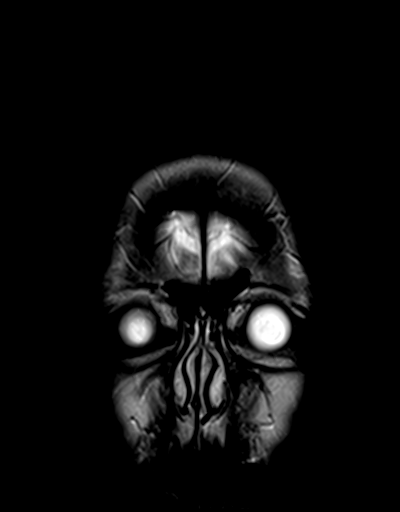

[48 of 48 positions shown; findings below may reference images not displayed]

FINDINGS: Brain: No acute infarction, hemorrhage, hydrocephalus, extra-axial
collection or mass lesion. Moderate parenchymal volume loss,
predominantly frontal.

Vascular: Normal flow voids.

Skull and upper cervical spine: Normal marrow signal.

Sinuses/Orbits: Negative.
IMPRESSION: 1. No acute intracranial abnormality.
2. Moderate parenchymal volume loss, more pronounced in the
bilateral frontal lobes.

## 2021-12-24 ENCOUNTER — Ambulatory Visit: Payer: Medicare Other | Attending: Cardiology | Admitting: Cardiology

## 2021-12-24 ENCOUNTER — Encounter: Payer: Self-pay | Admitting: Cardiology

## 2021-12-24 VITALS — BP 110/56 | HR 95 | Ht 61.0 in | Wt 178.4 lb

## 2021-12-24 DIAGNOSIS — I480 Paroxysmal atrial fibrillation: Secondary | ICD-10-CM

## 2021-12-24 DIAGNOSIS — I25118 Atherosclerotic heart disease of native coronary artery with other forms of angina pectoris: Secondary | ICD-10-CM

## 2021-12-24 DIAGNOSIS — E7800 Pure hypercholesterolemia, unspecified: Secondary | ICD-10-CM

## 2021-12-24 DIAGNOSIS — Q211 Atrial septal defect, unspecified: Secondary | ICD-10-CM | POA: Diagnosis not present

## 2021-12-24 DIAGNOSIS — I10 Essential (primary) hypertension: Secondary | ICD-10-CM | POA: Diagnosis not present

## 2021-12-24 NOTE — Patient Instructions (Signed)
  Testing/Procedures:  Your physician has requested that you have an echocardiogram. Echocardiography is a painless test that uses sound waves to create images of your heart. It provides your doctor with information about the size and shape of your heart and how well your heart's chambers and valves are working. This procedure takes approximately one hour. There are no restrictions for this procedure. Please do NOT wear cologne, perfume, aftershave, or lotions (deodorant is allowed). Please arrive 15 minutes prior to your appointment time. [place], you and your health needs are our priority.  As part of our continuing mission to provide you with exceptional heart care, we have created designated Provider Care Teams.  These Care Teams include your primary Cardiologist (physician) and Advanced Practice Providers (APPs -  Physician Assistants and Nurse Practitioners) who all work together to provide you with the care you need, when you need it.  We recommend signing up for the patient portal called "MyChart".  Sign up information is provided on this After Visit Summary.  MyChart is used to connect with patients for Virtual Visits (Telemedicine).  Patients are able to view lab/test results, encounter notes, upcoming appointments, etc.  Non-urgent messages can be sent to your provider as well.   To learn more about what you can do with MyChart, go to [email or web address].    Your next appointment:   6 month(s)  The format for your next appointment:   In Person  Provider:   Kirk Ruths, MD

## 2021-12-29 ENCOUNTER — Other Ambulatory Visit: Payer: Self-pay | Admitting: Medical

## 2022-01-01 ENCOUNTER — Ambulatory Visit (INDEPENDENT_AMBULATORY_CARE_PROVIDER_SITE_OTHER): Payer: Medicare Other | Admitting: Medical

## 2022-01-01 ENCOUNTER — Encounter: Payer: Self-pay | Admitting: Medical

## 2022-01-01 VITALS — BP 112/70 | HR 80 | Temp 98.3°F | Wt 174.4 lb

## 2022-01-01 DIAGNOSIS — I152 Hypertension secondary to endocrine disorders: Secondary | ICD-10-CM

## 2022-01-01 DIAGNOSIS — Z7901 Long term (current) use of anticoagulants: Secondary | ICD-10-CM

## 2022-01-01 DIAGNOSIS — E785 Hyperlipidemia, unspecified: Secondary | ICD-10-CM

## 2022-01-01 DIAGNOSIS — I700 Atherosclerosis of aorta: Secondary | ICD-10-CM

## 2022-01-01 DIAGNOSIS — I10 Essential (primary) hypertension: Secondary | ICD-10-CM | POA: Diagnosis not present

## 2022-01-01 DIAGNOSIS — G629 Polyneuropathy, unspecified: Secondary | ICD-10-CM

## 2022-01-01 DIAGNOSIS — E1159 Type 2 diabetes mellitus with other circulatory complications: Secondary | ICD-10-CM

## 2022-01-01 DIAGNOSIS — E039 Hypothyroidism, unspecified: Secondary | ICD-10-CM

## 2022-01-01 DIAGNOSIS — K760 Fatty (change of) liver, not elsewhere classified: Secondary | ICD-10-CM | POA: Diagnosis not present

## 2022-01-01 DIAGNOSIS — E1169 Type 2 diabetes mellitus with other specified complication: Secondary | ICD-10-CM | POA: Diagnosis not present

## 2022-01-01 DIAGNOSIS — Z79899 Other long term (current) drug therapy: Secondary | ICD-10-CM

## 2022-01-01 DIAGNOSIS — I25118 Atherosclerotic heart disease of native coronary artery with other forms of angina pectoris: Secondary | ICD-10-CM

## 2022-01-01 DIAGNOSIS — I5032 Chronic diastolic (congestive) heart failure: Secondary | ICD-10-CM

## 2022-01-01 DIAGNOSIS — G894 Chronic pain syndrome: Secondary | ICD-10-CM

## 2022-01-01 LAB — POCT GLYCOSYLATED HEMOGLOBIN (HGB A1C): Hemoglobin A1C: 6.6 % — AB (ref 4.0–5.6)

## 2022-01-01 MED ORDER — MOUNJARO 10 MG/0.5ML ~~LOC~~ SOAJ
10.0000 mg | SUBCUTANEOUS | 5 refills | Status: DC
Start: 2022-01-01 — End: 2022-03-22

## 2022-01-01 NOTE — Progress Notes (Signed)
Subjective:  Sandra Ruiz is a 76 y.o. female who presents for Chief Complaint  Patient presents with   Diabetes    Diabetes check, A1C been around 6.0, pt. Does daily feet checks, losing a lot of weight,      Here for med check.  Compliant with all medications in general.  She just saw cardiology recently and medications were continued.  She does have some concern about needing to drop blood pressure medicine in the future if her blood pressures are running too low.  She has had a few episodes of dizziness in recent months  She continues to lose weight through use of Mounjaro, healthier diet.  She is not exercising as much as she should.  She has free access to a gym close to her home and has water aerobics and multiple options for exercise  She is seeing Dr. Davy Pique, pain management.  She continues on gabapentin and Cymbalta  She has not gotten tetanus and shingles vaccines through her pharmacy yet  No other aggravating or relieving factors.    No other c/o.  Past Medical History:  Diagnosis Date   Chronic pain    Depression    Diabetes mellitus without complication (HCC)    GERD (gastroesophageal reflux disease)    History of TIA (transient ischemic attack)    Hyperlipidemia    Hyperopia of both eyes with astigmatism and presbyopia 12/21/2019   Hypertension    Lyme disease    Paroxysmal atrial fibrillation (HCC)    Sleep apnea    Thyroid disease    Current Outpatient Medications on File Prior to Visit  Medication Sig Dispense Refill   ACCU-CHEK GUIDE test strip TEST 1 - 2 TIMES DAILY 200 strip 0   Accu-Chek Softclix Lancets lancets CHECK BLOOD SUGAR ONE TO TWO TIMES DAILY. 200 each 2   ALPRAZolam (XANAX) 0.25 MG tablet Take 1 tablet (0.25 mg total) by mouth 2 (two) times daily as needed for anxiety. 20 tablet 0   apixaban (ELIQUIS) 5 MG TABS tablet Take 1 tablet (5 mg total) by mouth 2 (two) times daily. 200 tablet 3   Blood Glucose Monitoring Suppl (ACCU-CHEK GUIDE  ME) w/Device KIT Test 1-2 times daily 1 kit 0   carvedilol (COREG) 25 MG tablet Take 1 tablet (25 mg total) by mouth 2 (two) times daily with a meal. 200 tablet 3   diltiazem (DILT-XR) 180 MG 24 hr capsule TAKE 1 CAPSULE BY MOUTH DAILY 100 capsule 1   DULoxetine (CYMBALTA) 60 MG capsule Take 1 capsule (60 mg total) by mouth at bedtime.     ezetimibe (ZETIA) 10 MG tablet TAKE 1 TABLET BY MOUTH DAILY 100 tablet 2   furosemide (LASIX) 20 MG tablet TAKE 0.5 TABLET BY MOUTH DAILY AS NEEDED SWELLING OR WEIGHT GAIN 90 tablet 0   gabapentin (NEURONTIN) 800 MG tablet TAKE 1 TABLET BY MOUTH 3 TIMES  DAILY 270 tablet 3   levothyroxine (SYNTHROID) 75 MCG tablet TAKE 1 TABLET BY MOUTH DAILY  BEFORE BREAKFAST 90 tablet 0   lisinopril (ZESTRIL) 10 MG tablet TAKE 1 TABLET BY MOUTH DAILY 100 tablet 2   omeprazole (PRILOSEC) 20 MG capsule TAKE 1 CAPSULE BY MOUTH DAILY 100 capsule 1   oxybutynin (DITROPAN-XL) 10 MG 24 hr tablet TAKE 1 TABLET BY MOUTH TWICE  DAILY 200 tablet 2   pravastatin (PRAVACHOL) 40 MG tablet TAKE 1 TABLET BY MOUTH IN THE  EVENING 100 tablet 2   Tetrahydrozoline HCl ([facility] EYE DROPS OP)  Place 1 drop into both eyes in the morning, at noon, in the evening, and at bedtime. Dry eyes     nitroGLYCERIN (NITROSTAT) 0.4 MG SL tablet Place 1 tablet (0.4 mg total) under the tongue every 5 (five) minutes as needed for chest pain. (Patient not taking: Reported on 01/01/2022) 30 tablet 0   No current facility-administered medications on file prior to visit.    The following portions of the patient's history were reviewed and updated as appropriate: allergies, current medications, past family history, past medical history, past social history, past surgical history and problem list.  ROS Otherwise as in subjective above   Objective: BP 112/70   Pulse 80   Temp 98.3 F (36.8 C)   Wt 174 lb 6.4 oz (79.1 kg)   BMI 32.95 kg/m   Wt Readings from Last 3 Encounters:  01/01/22 174 lb 6.4 oz (79.1 kg)   12/24/21 178 lb 6.4 oz (80.9 kg)  11/20/21 184 lb 6.4 oz (83.6 kg)   General appearance: alert, no distress, well developed, well nourished Neck: supple, no lymphadenopathy, no thyromegaly, no masses Heart: RRR, normal S1, S2, no murmurs Lungs: CTA bilaterally, no wheezes, rhonchi, or rales Pulses: 2+ radial pulses, 2+ pedal pulses, normal cap refill Ext: no edema  Diabetic Foot Exam - Simple   Simple Foot Form Diabetic Foot exam was performed with the following findings: Yes 01/01/2022  9:57 AM  Visual Inspection See comments: Yes Sensation Testing See comments: Yes Pulse Check Posterior Tibialis and Dorsalis pulse intact bilaterally: Yes Comments Flat feet, decreased sensation throughout bilat      Assessment: Encounter Diagnoses  Name Primary?   Type 2 diabetes mellitus with other specified complication, without long-term current use of insulin (HCC) Yes   Essential hypertension, benign    Fatty liver    Hypothyroidism, unspecified type    Hyperlipidemia, unspecified hyperlipidemia type    Aortic atherosclerosis (HCC)    Chronic diastolic CHF (congestive heart failure) (HCC)    Coronary artery disease of native artery of native heart with stable angina pectoris (HCC)    Hypertension associated with diabetes ([place])    Neuropathy    Chronic pain syndrome    Current use of long term anticoagulation    High risk medication use      Plan: Diabetes-at goal, continue current efforts to lose additional weight.  Congratulated her on her weight loss so far.  Tolerating Mounjaro just fine.   Hypertension, history of heart failure, coronary artery disease-reviewed her recent December 2023 cardiology notes.  They continue current medication.  She is in normal sinus rhythm currently but has a history of A-fib.  She is compliant with Eliquis for anticoagulation.  We discussed that if she loses additional weight and starts to drop down and blood pressure along with some dizziness  she may need to cut back on carvedilol.  She will monitor blood pressures and keep a journal of symptoms over the next month.  She has had a few episodes of dizziness recently  Hypothyroidism-continue current therapy, levothyroxine 75 mcg daily  Hyperlipidemia, coronary artery disease-continue Zetia 10 mg daily and Pravachol 40 mg daily  She is using Lasix as needed for edema, not daily  Chronic pain, neuropathy-continue gabapentin, continue routine follow-up with pain management.  I strongly encouraged her to start using the [employer] that she has free access to currently for water aerobics and other exercises regularly  Fatty liver disease-continue efforts to lose weight   [place] was seen today  for diabetes.  Diagnoses and all orders for this visit:  Type 2 diabetes mellitus with other specified complication, without long-term current use of insulin (HCC) -     HgB A1c  Essential hypertension, benign  Fatty liver  Hypothyroidism, unspecified type  Hyperlipidemia, unspecified hyperlipidemia type  Aortic atherosclerosis (HCC)  Chronic diastolic CHF (congestive heart failure) (HCC)  Coronary artery disease of native artery of native heart with stable angina pectoris ([place])  Hypertension associated with diabetes (HCC)  Neuropathy  Chronic pain syndrome  Current use of long term anticoagulation  High risk medication use  Other orders -     tirzepatide (MOUNJARO) 10 MG/0.5ML Pen; Inject 10 mg into the skin once a week.    Follow up: 45mo

## 2022-01-10 ENCOUNTER — Other Ambulatory Visit: Payer: Self-pay | Admitting: Medical

## 2022-01-13 ENCOUNTER — Ambulatory Visit: Payer: Medicare Other | Admitting: Endocrinology

## 2022-01-15 ENCOUNTER — Ambulatory Visit (INDEPENDENT_AMBULATORY_CARE_PROVIDER_SITE_OTHER): Payer: Medicare Other

## 2022-01-15 DIAGNOSIS — I5032 Chronic diastolic (congestive) heart failure: Secondary | ICD-10-CM

## 2022-01-18 LAB — CUP PACEART REMOTE DEVICE CHECK
Date Time Interrogation Session: 20240112231520
Implantable Pulse Generator Implant Date: 20231103

## 2022-01-21 NOTE — Progress Notes (Signed)
Carelink Summary Report / Loop Recorder

## 2022-01-26 ENCOUNTER — Telehealth: Payer: Self-pay | Admitting: Medical

## 2022-01-26 NOTE — Telephone Encounter (Signed)
Sandra Ruiz called and is asking for another referral to neurology because she says her vision will get blurry and she sees white and if she doesn't sit down fast enough she blacks out and will fall.

## 2022-01-26 NOTE — Telephone Encounter (Signed)
Pt has an appt on thursday

## 2022-01-26 NOTE — Telephone Encounter (Signed)
Is this okay to put in referral

## 2022-01-27 ENCOUNTER — Ambulatory Visit (HOSPITAL_COMMUNITY): Payer: Medicare Other | Attending: Cardiology

## 2022-01-27 DIAGNOSIS — Q211 Atrial septal defect, unspecified: Secondary | ICD-10-CM | POA: Diagnosis not present

## 2022-01-27 LAB — ECHOCARDIOGRAM COMPLETE
Area-P 1/2: 5.09 cm2
S' Lateral: 2.7 cm

## 2022-01-28 ENCOUNTER — Institutional Professional Consult (permissible substitution): Payer: Medicare Other | Admitting: Medical

## 2022-02-02 NOTE — Progress Notes (Signed)
Carelink Summary Report / Loop Recorder

## 2022-02-05 ENCOUNTER — Encounter: Payer: Self-pay | Admitting: *Deleted

## 2022-02-11 ENCOUNTER — Telehealth: Payer: Self-pay | Admitting: Cardiology

## 2022-02-11 NOTE — Telephone Encounter (Signed)
Pt c/o Syncope: STAT if syncope occurred within 30 minutes and pt complains of lightheadedness High Priority if episode of passing out, completely, today or in last 24 hours   Did you pass out today? no   When is the last time you passed out? About two weeks ago.    Has this occurred multiple times? Patient states she has passed out in 4-5 times in a month.   Did you have any symptoms prior to passing out? She stating she feels dizzy, she can't think straight, her heart just feels like it's stopping prior to passing out.     She states she received the results to her echo she is wondering if the if what the results said if that has anything to do with her passing out.

## 2022-02-12 NOTE — Telephone Encounter (Signed)
Spoke with pt, Follow up scheduled  

## 2022-02-17 ENCOUNTER — Ambulatory Visit (INDEPENDENT_AMBULATORY_CARE_PROVIDER_SITE_OTHER): Payer: Medicare Other | Admitting: Medical

## 2022-02-17 ENCOUNTER — Ambulatory Visit: Payer: Medicare Other

## 2022-02-17 VITALS — BP 88/52 | HR 85 | Wt 171.0 lb

## 2022-02-17 DIAGNOSIS — R55 Syncope and collapse: Secondary | ICD-10-CM | POA: Diagnosis not present

## 2022-02-17 DIAGNOSIS — E039 Hypothyroidism, unspecified: Secondary | ICD-10-CM | POA: Diagnosis not present

## 2022-02-17 DIAGNOSIS — I4819 Other persistent atrial fibrillation: Secondary | ICD-10-CM

## 2022-02-17 DIAGNOSIS — I10 Essential (primary) hypertension: Secondary | ICD-10-CM

## 2022-02-17 DIAGNOSIS — E1169 Type 2 diabetes mellitus with other specified complication: Secondary | ICD-10-CM

## 2022-02-17 DIAGNOSIS — Z7901 Long term (current) use of anticoagulants: Secondary | ICD-10-CM

## 2022-02-17 DIAGNOSIS — I5032 Chronic diastolic (congestive) heart failure: Secondary | ICD-10-CM

## 2022-02-17 DIAGNOSIS — Z599 Problem related to housing and economic circumstances, unspecified: Secondary | ICD-10-CM

## 2022-02-17 NOTE — Patient Instructions (Addendum)
  Your blood pressure is too low!   Since you have lost weight in recent months on the [facility], your blood pressures have been dropping to the point your medication needs to be adjusted.   DO NOT take Lisinopril '10mg'$  tomorrow through Sunday.  Resume Lisinopril on Monday of next week, 02/22/21  Do not take Carvedilol/Coreg '25mg'$  this evening, Thursday, and Friday.    When you resume Carvedilol on Saturday, use 1/4 tablet twice daily which would be 6.'25mg'$  twice daily.    Continue Diltiazem 180 mg daily as usual  Do not take any Lasix fluid pill the next 5 days, which you haven't had to use this lately in general, but given your hypotension today, no lasix the next 5 days  Continue the rest of medicaiton as usual  I need you to either my chart or call or recheck by Tuesday next week to give me an update on your blood pressure.    Hopefully you will also get back in with cardiology within the next 7 days.  Continue to hydrate with 80-100 ounces of water daily.    Check out the [place] webpage or [place].  There is an online application or you can call [phone] and ask for financial assistance dept.    [email or web address]

## 2022-02-17 NOTE — Progress Notes (Signed)
Subjective:  Sandra Ruiz is a 77 y.o. female who presents for Chief Complaint  Patient presents with   Blurred Vision    Blurred vision and fainting- feeling like this 2-3 times a week. Happen yesterday      Here for concern for fainting/syncope.  She notes some passing out episodes frequently in recent weeks.  She says she also drained an anterior weight before this happens.  A few times they have been witnessed with sometimes not.  She thinks she is out just for seconds.  No seizure activity reported by anybody.  She is compliant with her medications regularly.  She does not have a blood pressure cuff at home.  She has not been checking her blood sugars  Sees afib clinic, has been using quarter.  She is placed a call to cardiology to get in for follow-up  3 years ago had near death experience passing out.   At that time was thought to be neurological vs cardiogenic.   No chest pain, shortness of breath, palpitations, no edema  No other aggravating or relieving factors.    No other c/o.  Past Medical History:  Diagnosis Date   Chronic pain    Depression    Diabetes mellitus without complication (HCC)    GERD (gastroesophageal reflux disease)    History of TIA (transient ischemic attack)    Hyperlipidemia    Hyperopia of both eyes with astigmatism and presbyopia 12/21/2019   Hypertension    Lyme disease    Paroxysmal atrial fibrillation (HCC)    Sleep apnea    Thyroid disease    Current Outpatient Medications on File Prior to Visit  Medication Sig Dispense Refill   ALPRAZolam (XANAX) 0.25 MG tablet Take 1 tablet (0.25 mg total) by mouth 2 (two) times daily as needed for anxiety. 20 tablet 0   apixaban (ELIQUIS) 5 MG TABS tablet Take 1 tablet (5 mg total) by mouth 2 (two) times daily. 200 tablet 3   carvedilol (COREG) 25 MG tablet Take 1 tablet (25 mg total) by mouth 2 (two) times daily with a meal. 200 tablet 3   diltiazem (DILT-XR) 180 MG 24 hr capsule TAKE 1 CAPSULE  BY MOUTH DAILY 100 capsule 1   DULoxetine (CYMBALTA) 60 MG capsule Take 1 capsule (60 mg total) by mouth at bedtime.     ezetimibe (ZETIA) 10 MG tablet TAKE 1 TABLET BY MOUTH DAILY 100 tablet 2   gabapentin (NEURONTIN) 800 MG tablet TAKE 1 TABLET BY MOUTH 3 TIMES  DAILY 270 tablet 3   levothyroxine (SYNTHROID) 75 MCG tablet TAKE 1 TABLET BY MOUTH DAILY  BEFORE BREAKFAST 90 tablet 1   lisinopril (ZESTRIL) 10 MG tablet TAKE 1 TABLET BY MOUTH DAILY 100 tablet 2   omeprazole (PRILOSEC) 20 MG capsule TAKE 1 CAPSULE BY MOUTH DAILY 100 capsule 1   oxybutynin (DITROPAN-XL) 10 MG 24 hr tablet TAKE 1 TABLET BY MOUTH TWICE  DAILY 200 tablet 2   pravastatin (PRAVACHOL) 40 MG tablet TAKE 1 TABLET BY MOUTH IN THE  EVENING 100 tablet 2   tirzepatide (MOUNJARO) 10 MG/0.5ML Pen Inject 10 mg into the skin once a week. 6 mL 5   ACCU-CHEK GUIDE test strip TEST 1 - 2 TIMES DAILY (Patient not taking: Reported on 02/17/2022) 200 strip 0   Accu-Chek Softclix Lancets lancets CHECK BLOOD SUGAR ONE TO TWO TIMES DAILY. (Patient not taking: Reported on 02/17/2022) 200 each 2   Blood Glucose Monitoring Suppl ([place] [facility]) w/Device  KIT Test 1-2 times daily 1 kit 0   furosemide (LASIX) 20 MG tablet TAKE 0.5 TABLET BY MOUTH DAILY AS NEEDED SWELLING OR WEIGHT GAIN (Patient not taking: Reported on 02/17/2022) 90 tablet 0   nitroGLYCERIN (NITROSTAT) 0.4 MG SL tablet Place 1 tablet (0.4 mg total) under the tongue every 5 (five) minutes as needed for chest pain. (Patient not taking: Reported on 02/17/2022) 30 tablet 0   Tetrahydrozoline HCl ([facility] EYE DROPS OP) Place 1 drop into both eyes in the morning, at noon, in the evening, and at bedtime. Dry eyes (Patient not taking: Reported on 02/17/2022)     No current facility-administered medications on file prior to visit.    The following portions of the patient's history were reviewed and updated as appropriate: allergies, current medications, past family history, past medical history,  past social history, past surgical history and problem list.  ROS Otherwise as in subjective above    Objective: BP (!) 88/52 (Patient Position: Standing)   Pulse 85   Wt 171 lb (77.6 kg)   BMI 32.31 kg/m   Wt Readings from Last 3 Encounters:  02/17/22 171 lb (77.6 kg)  01/01/22 174 lb 6.4 oz (79.1 kg)  12/24/21 178 lb 6.4 oz (80.9 kg)   BP Readings from Last 3 Encounters:  02/17/22 (!) 88/52  01/01/22 112/70  12/24/21 (!) 110/56   General appearance: alert, no distress, well developed, well nourished HEENT: normocephalic, sclerae anicteric, conjunctiva pink and moist, TMs pearly, nares patent, no discharge or erythema, pharynx normal Oral cavity: MMM, no lesions Neck: supple, no lymphadenopathy, no thyromegaly, no masses Heart: RRR, normal S1, S2, no murmurs Lungs: CTA bilaterally, no wheezes, rhonchi, or rales Pulses: 2+ radial pulses, 2+ pedal pulses, normal cap refill Ext: no edema Neuro: cn2-12 intact, nonfocal exam    Assessment: Encounter Diagnoses  Name Primary?   Syncope, unspecified syncope type Yes   Current use of long term anticoagulation    Hypothyroidism, unspecified type    Type 2 diabetes mellitus with other specified complication, without long-term current use of insulin (HCC)    Essential hypertension, benign    Persistent atrial fibrillation (HCC)    Chronic diastolic CHF (congestive heart failure) ([place])    Financial difficulties      Plan: We discussed her concerns, and since she has lost weight in recent months on [place], her BPs have started dropping gradually.  She unfortunately is not checking BP or sugars at home.  Her cuff is defective apparently and her glucometer needs new batteries.  I encouraged her to get in a position to be able to monitor BP and glucose at home.    We will make adjustments in medications today.     Discussed the following which I also printed for her.      Recommendations: Your blood pressure is too low!    Since you have lost weight in recent months on the [facility], your blood pressures have been dropping to the point your medication needs to be adjusted.   DO NOT take Lisinopril 47m tomorrow through Sunday.  Resume Lisinopril on Monday of next week, 02/22/21  Do not take Carvedilol/Coreg 285mthis evening, Thursday, and Friday.    When you resume Carvedilol on Saturday, use 1/4 tablet twice daily which would be 6.251mwice daily.    Continue Diltiazem 180 mg daily as usual  Do not take any Lasix fluid pill the next 5 days, which you haven't had to use this lately in general,  but given your hypotension today, no lasix the next 5 days  Continue the rest of medicaiton as usual  I need you to either my chart or call or recheck by Tuesday next week to give me an update on your blood pressure.    Hopefully you will also get back in with cardiology within the next 7 days.  Continue to hydrate with 80-100 ounces of water daily.    [place] was seen today for blurred vision.  Diagnoses and all orders for this visit:  Syncope, unspecified syncope type  Current use of long term anticoagulation  Hypothyroidism, unspecified type  Type 2 diabetes mellitus with other specified complication, without long-term current use of insulin (HCC)  Essential hypertension, benign  Persistent atrial fibrillation (HCC)  Chronic diastolic CHF (congestive heart failure) ([place])  Financial difficulties    Follow up: follow up with cardiology

## 2022-02-18 LAB — CUP PACEART REMOTE DEVICE CHECK
Date Time Interrogation Session: 20240214231901
Implantable Pulse Generator Implant Date: 20231103

## 2022-02-18 NOTE — Progress Notes (Signed)
HPI: FU atrial fibrillation. She has a history of paroxysmal atrial fibrillation and has had previous ablation in [place]. Carotid Dopplers February 2018 showed no significant stenosis. Transesophageal echocardiogram January 2020 in [place] showed normal LV function, mild mitral regurgitation and small secundum ASD.  Had implantable loop placed previously.  Cardiac CTA September 2022 showed calcium score 111 which was 67 percentile and minimal nonobstructive coronary artery disease. ABIs December 2022 moderate bilaterally.  Last cardioversion March 2023.  Echocardiogram repeated January 27, 2022 and showed ejection fraction 50%.  Patient recently contacted the office with complaints of syncopal episode.  Interrogation of implantable loop since December 10 showed no significant arrhythmias.  Since last seen there is no increased dyspnea on exertion, orthopnea, PND, pedal edema or exertional chest pain.  She has lost 40 pounds.  She has had problems with recurrent syncope.  Most recent episode was 1 month ago.  Each time it is typically after standing or standing and walking.  However she does states she is occasionally unconscious for 20 minutes.  Current Outpatient Medications  Medication Sig Dispense Refill   ACCU-CHEK GUIDE test strip TEST 1 - 2 TIMES DAILY 200 strip 0   Accu-Chek Softclix Lancets lancets CHECK BLOOD SUGAR ONE TO TWO TIMES DAILY. 200 each 2   ALPRAZolam (XANAX) 0.25 MG tablet Take 1 tablet (0.25 mg total) by mouth 2 (two) times daily as needed for anxiety. 20 tablet 0   apixaban (ELIQUIS) 5 MG TABS tablet Take 1 tablet (5 mg total) by mouth 2 (two) times daily. 200 tablet 3   Blood Glucose Monitoring Suppl (ACCU-CHEK GUIDE ME) w/Device KIT Test 1-2 times daily 1 kit 0   carvedilol (COREG) 25 MG tablet Take 1 tablet (25 mg total) by mouth 2 (two) times daily with a meal. 200 tablet 3   diltiazem (DILT-XR) 180 MG 24 hr capsule TAKE 1 CAPSULE BY MOUTH DAILY 100 capsule 1    DULoxetine (CYMBALTA) 60 MG capsule Take 1 capsule (60 mg total) by mouth at bedtime.     ezetimibe (ZETIA) 10 MG tablet TAKE 1 TABLET BY MOUTH DAILY 100 tablet 2   furosemide (LASIX) 20 MG tablet TAKE 0.5 TABLET BY MOUTH DAILY AS NEEDED SWELLING OR WEIGHT GAIN 90 tablet 0   gabapentin (NEURONTIN) 800 MG tablet TAKE 1 TABLET BY MOUTH 3 TIMES  DAILY 270 tablet 3   levothyroxine (SYNTHROID) 75 MCG tablet TAKE 1 TABLET BY MOUTH DAILY  BEFORE BREAKFAST 90 tablet 1   lisinopril (ZESTRIL) 10 MG tablet TAKE 1 TABLET BY MOUTH DAILY 100 tablet 2   nitroGLYCERIN (NITROSTAT) 0.4 MG SL tablet Place 1 tablet (0.4 mg total) under the tongue every 5 (five) minutes as needed for chest pain. 30 tablet 0   omeprazole (PRILOSEC) 20 MG capsule TAKE 1 CAPSULE BY MOUTH DAILY 100 capsule 1   oxybutynin (DITROPAN-XL) 10 MG 24 hr tablet TAKE 1 TABLET BY MOUTH TWICE  DAILY 200 tablet 2   pravastatin (PRAVACHOL) 40 MG tablet TAKE 1 TABLET BY MOUTH IN THE  EVENING 100 tablet 2   Tetrahydrozoline HCl ([facility] EYE DROPS OP) Place 1 drop into both eyes in the morning, at noon, in the evening, and at bedtime. Dry eyes     tirzepatide (MOUNJARO) 10 MG/0.5ML Pen Inject 10 mg into the skin once a week. 6 mL 5   No current facility-administered medications for this visit.     Past Medical History:  Diagnosis Date   Chronic  pain    Depression    Diabetes mellitus without complication (HCC)    GERD (gastroesophageal reflux disease)    History of TIA (transient ischemic attack)    Hyperlipidemia    Hyperopia of both eyes with astigmatism and presbyopia 12/21/2019   Hypertension    Lyme disease    Paroxysmal atrial fibrillation (HCC)    Sleep apnea    Thyroid disease     Past Surgical History:  Procedure Laterality Date   ATRIAL FIBRILLATION ABLATION     x2 in [place] N/A 03/05/2021   Procedure: CARDIOVERSION;  Surgeon: Freada Bergeron, MD;  Location: [facility] ENDOSCOPY;  Service:  Cardiovascular;  Laterality: N/A;   CERVICAL SPINE SURGERY     implantable loop recorder placement  11/07/2017   MDT LINQ1 implanted in [place] for afib management by Dr Candiss Norse   LOOP RECORDER INSERTION N/A 11/06/2021   Procedure: LOOP RECORDER INSERTION;  Surgeon: Melida Quitter, MD;  Location: [facility] CV LAB;  Service: Cardiovascular;  Laterality: N/A;   LOOP RECORDER REMOVAL N/A 11/06/2021   Procedure: LOOP RECORDER REMOVAL;  Surgeon: Melida Quitter, MD;  Location: [facility] CV LAB;  Service: Cardiovascular;  Laterality: N/A;   LUMBAR SPINE SURGERY     TONSILLECTOMY AND ADENOIDECTOMY      Social History   Socioeconomic History   Marital status: Married    Spouse name: Not on file   Number of children: 5   Years of education: Not on file   Highest education level: Some college, no degree  Occupational History   Not on file  Tobacco Use   Smoking status: Former   Smokeless tobacco: Never   Tobacco comments:    Former smoker 11/12/2020  Vaping Use   Vaping Use: Never used  Substance and Sexual Activity   Alcohol use: Yes    Alcohol/week: 2.0 standard drinks of alcohol    Types: 2 Standard drinks or equivalent per week    Comment: mix drinks occ.   Drug use: Never   Sexual activity: Not on file  Other Topics Concern   Not on file  Social History Narrative   From [place], married, [place], new from [place] to [place]  07/2019   Right handed   Some caffeine use    Lives with husband, daughter, and son-in-law   Social Determinants of Health   Financial Resource Strain: [place]  (10/30/2021)   Overall Financial Resource Strain (CARDIA)    Difficulty of Paying Living Expenses: Not hard at all  Food Insecurity: No Food Insecurity (10/30/2021)   Hunger Vital Sign    Worried About Running Out of Food in the Last Year: Never true    [place] in the Last Year: Never true  Transportation Needs: No Transportation Needs (10/30/2021)    PRAPARE - Hydrologist (Medical): No    Lack of Transportation (Non-Medical): No  Physical Activity: Inactive (10/30/2021)   Exercise Vital Sign    Days of Exercise per Week: 0 days    Minutes of Exercise per Session: 0 min  Stress: Stress Concern Present (10/30/2021)   [place]    Feeling of Stress : To some extent  Social Connections: Not on file  Intimate Partner Violence: Not on file    Family History  Problem Relation Age of Onset   CAD Mother  Thyroid disease Mother    CAD Father    Thyroid disease Daughter    Hypothyroidism Daughter     ROS: no fevers or chills, productive cough, hemoptysis, dysphasia, odynophagia, melena, hematochezia, dysuria, hematuria, rash, seizure activity, orthopnea, PND, pedal edema, claudication. Remaining systems are negative.  Physical Exam: Well-developed well-nourished in no acute distress.  Skin is warm and dry.  HEENT is normal.  Neck is supple.  Chest is clear to auscultation with normal expansion.  Cardiovascular exam is regular rate and rhythm.  Abdominal exam nontender or distended. No masses palpated. Extremities show no edema. neuro grossly intact  ECG-normal sinus rhythm at a rate of 80, cannot rule out anterior infarct.  Personally reviewed  A/P  1 paroxysmal atrial fibrillation-she is in sinus rhythm on exam today.  Continue Cardizem and apixaban.  Implantable loop in place to monitor burden of atrial fibrillation.  2 hypertension-patient's blood pressure has been running low.  I think it is likely contributing to her syncope.  Discontinue carvedilol.  Decrease lisinopril from 10 mg daily to 2.5 mg daily.  If symptoms persist will discontinue lisinopril altogether and consider decreasing Cardizem.  Follow blood pressure and adjust as needed.  3 hyperlipidemia-continue pravastatin and Zetia.  She previously did not tolerate  Crestor or Lipitor.  4 nonobstructive coronary disease-continue statin.  No aspirin given need for apixaban.  5 chronic diastolic congestive heart failure-she remains euvolemic.  Continue Lasix as needed.  6 history of small secundum ASD-prior echocardiogram did not show RV dilatation and we will follow.  7 history of chest pain-she has had this previously but no episodes since last office visit.  Previous CTA showed minimal nonobstructive coronary disease.  8 obstructive sleep apnea-patient uses an oral device at home.  9 syncope-interrogation of implantable loop without significant arrhythmia.  Symptoms sound to be orthostatic mediated.  She has lost 40 pounds and her blood pressure is borderline.  Plan to discontinue carvedilol and decrease lisinopril as outlined above.  Follow blood pressure and adjust as needed.  Kirk Ruths, MD

## 2022-02-24 ENCOUNTER — Ambulatory Visit: Payer: Medicare Other | Attending: Cardiology | Admitting: Cardiology

## 2022-02-24 ENCOUNTER — Encounter: Payer: Self-pay | Admitting: Cardiology

## 2022-02-24 VITALS — BP 116/70 | HR 80 | Ht 61.0 in | Wt 167.4 lb

## 2022-02-24 DIAGNOSIS — I5032 Chronic diastolic (congestive) heart failure: Secondary | ICD-10-CM | POA: Diagnosis not present

## 2022-02-24 DIAGNOSIS — I10 Essential (primary) hypertension: Secondary | ICD-10-CM

## 2022-02-24 DIAGNOSIS — I25118 Atherosclerotic heart disease of native coronary artery with other forms of angina pectoris: Secondary | ICD-10-CM

## 2022-02-24 DIAGNOSIS — I480 Paroxysmal atrial fibrillation: Secondary | ICD-10-CM | POA: Diagnosis not present

## 2022-02-24 DIAGNOSIS — Q211 Atrial septal defect, unspecified: Secondary | ICD-10-CM

## 2022-02-24 DIAGNOSIS — R55 Syncope and collapse: Secondary | ICD-10-CM | POA: Diagnosis not present

## 2022-02-24 DIAGNOSIS — E7800 Pure hypercholesterolemia, unspecified: Secondary | ICD-10-CM

## 2022-02-24 MED ORDER — LISINOPRIL 2.5 MG PO TABS: 2.5000 mg | ORAL_TABLET | Freq: Every day | ORAL | 3 refills | Status: DC

## 2022-02-24 NOTE — Patient Instructions (Signed)
Medication Instructions:   STOP CARVEDILOL  DECREASE LISINOPRIL TO 2.5 MG ONCE DAILY  *If you need a refill on your cardiac medications before your next appointment, please call your pharmacy*   Follow-Up: At [facility], you and your health needs are our priority.  As part of our continuing mission to provide you with exceptional heart care, we have created designated Provider Care Teams.  These Care Teams include your primary Cardiologist (physician) and Advanced Practice Providers (APPs -  Physician Assistants and Nurse Practitioners) who all work together to provide you with the care you need, when you need it.  We recommend signing up for the patient portal called "MyChart".  Sign up information is provided on this After Visit Summary.  MyChart is used to connect with patients for Virtual Visits (Telemedicine).  Patients are able to view lab/test results, encounter notes, upcoming appointments, etc.  Non-urgent messages can be sent to your provider as well.   To learn more about what you can do with MyChart, go to [email or web address].    Your next appointment:   8 week(s)  Provider:   ANY APP  Then, Kirk Ruths, MD will plan to see you again in 6 month(s)

## 2022-03-03 ENCOUNTER — Telehealth: Payer: Self-pay

## 2022-03-03 NOTE — Telephone Encounter (Signed)
Took call from Pt.  She was very upset.  She is having heart rates in the 138-143 range that cause her to feel poorly.  She states she can't cook like she wants to.  She can only sit.  Her loop recorder was programmed to see tachycardia greater than 158 bpm for >= 16 beats.    Discussed with AT.  Will reduce tachy detection to 130 bpm to try to identify tachy rhythm she is describing.  She has follow up with AFib clinic in 1 week.  Using shared decision making-Pt will continue her diltiazem 180 mg PO daily for now.  This will allow her device to detect and record her current arrhythmia.  IF she continues to feel poorly prior to her afib clinic appt she will call back.  Per AT- OK to increase diltiazem to 240 mg PO daily.  Pt thanked nurse for assistance.  Pt was reassured by end of call.

## 2022-03-03 NOTE — Telephone Encounter (Signed)
The patient called this morning upset because we have not called her about high heart rates. I let her speak with Sonia Baller, rn.

## 2022-03-05 ENCOUNTER — Other Ambulatory Visit: Payer: Self-pay | Admitting: *Deleted

## 2022-03-09 ENCOUNTER — Other Ambulatory Visit: Payer: Self-pay | Admitting: Medical

## 2022-03-10 NOTE — Telephone Encounter (Signed)
Pt was given a year supply back in 06/2021 so they should not be out of med

## 2022-03-11 ENCOUNTER — Ambulatory Visit (HOSPITAL_COMMUNITY)
Admission: RE | Admit: 2022-03-11 | Discharge: 2022-03-11 | Disposition: A | Discharge status: Home / Self Care | Payer: Medicare Other | Source: Ambulatory Visit | Attending: Physician Assistant | Admitting: Physician Assistant

## 2022-03-11 VITALS — BP 152/80 | HR 107 | Ht 61.0 in | Wt 164.6 lb

## 2022-03-11 DIAGNOSIS — I4819 Other persistent atrial fibrillation: Secondary | ICD-10-CM | POA: Insufficient documentation

## 2022-03-11 DIAGNOSIS — Z79899 Other long term (current) drug therapy: Secondary | ICD-10-CM | POA: Insufficient documentation

## 2022-03-11 DIAGNOSIS — R000 Tachycardia, unspecified: Secondary | ICD-10-CM | POA: Insufficient documentation

## 2022-03-11 DIAGNOSIS — I5032 Chronic diastolic (congestive) heart failure: Secondary | ICD-10-CM | POA: Insufficient documentation

## 2022-03-11 DIAGNOSIS — I2510 Atherosclerotic heart disease of native coronary artery without angina pectoris: Secondary | ICD-10-CM | POA: Diagnosis not present

## 2022-03-11 DIAGNOSIS — E785 Hyperlipidemia, unspecified: Secondary | ICD-10-CM | POA: Insufficient documentation

## 2022-03-11 DIAGNOSIS — G4733 Obstructive sleep apnea (adult) (pediatric): Secondary | ICD-10-CM | POA: Insufficient documentation

## 2022-03-11 DIAGNOSIS — Z87891 Personal history of nicotine dependence: Secondary | ICD-10-CM | POA: Diagnosis not present

## 2022-03-11 DIAGNOSIS — Z7901 Long term (current) use of anticoagulants: Secondary | ICD-10-CM | POA: Diagnosis not present

## 2022-03-11 DIAGNOSIS — I110 Hypertensive heart disease with heart failure: Secondary | ICD-10-CM | POA: Insufficient documentation

## 2022-03-11 DIAGNOSIS — D6869 Other thrombophilia: Secondary | ICD-10-CM | POA: Diagnosis not present

## 2022-03-11 DIAGNOSIS — E119 Type 2 diabetes mellitus without complications: Secondary | ICD-10-CM | POA: Diagnosis not present

## 2022-03-11 DIAGNOSIS — Z7985 Long-term (current) use of injectable non-insulin antidiabetic drugs: Secondary | ICD-10-CM | POA: Diagnosis not present

## 2022-03-11 LAB — TSH: TSH: 1.09 u[IU]/mL (ref 0.350–4.500)

## 2022-03-11 MED ORDER — DILTIAZEM HCL 30 MG PO TABS: ORAL_TABLET | ORAL | 1 refills | Status: DC

## 2022-03-11 NOTE — Progress Notes (Signed)
Primary Care Physician: [name], PA-C Primary Cardiologist: [name] Primary Electrophysiologist: Dr Myles Gip Referring Physician: [name] is a 77 y.o. female with a history of atrial fibrillation s/p ablation x 2 in [place], HLD, OSA, HTN, DM who presents for follow up in the [facility]. The patient was initially diagnosed with atrial fibrillation in 2019 and had two ablations that year before moving here from [place]. Patient is on Eliquis for a CHADS2VASC score of 7. DCCV was arranged for 02/14/20 but she presented in [place].  The device clinic received an alert for an ongoing afib episode starting 01/31/21. She had noted more fatigue and SOB at that time. There were no specific triggers that she could identify. Patient is s/p DCCV on 03/05/21.   Patient underwent ILR replacement on 11/06/21.   On follow up today, patient was seen by [name] on 02/24/22 with complaints of syncope. ILR did not show any arrhythmogenic cause. Felt to be orthostatic hypotension so her carvedilol was discontinued and lisinopril decreased. She still occasionally feels that her heart is "going to burst out of her chest". Her ILR has not shown any sustained tachycardia (longest was ~ 8 minutes). She has not had any more syncope.   Today, she denies symptoms of chest pain, PND, syncope, bleeding, or neurologic sequela. The patient is tolerating medications without difficulties and is otherwise without complaint today.    Atrial Fibrillation Risk Factors:  she does have symptoms or diagnosis of sleep apnea. she is compliant with oral device. she does not have a history of rheumatic fever. she does not have a history of alcohol use. The patient does not have a history of early familial atrial fibrillation or other arrhythmias.  she has a BMI of Body mass index is 31.1 kg/m.Marland Kitchen Filed Weights   03/11/22 1103  Weight: 74.7 kg    Family History   Problem Relation Age of Onset   CAD Mother    Thyroid disease Mother    CAD Father    Thyroid disease Daughter    Hypothyroidism Daughter      Atrial Fibrillation Management history:  Previous antiarrhythmic drugs: none Previous cardioversions: none Previous ablations: 2019 x 2 in [place] score: 7 Anticoagulation history: Eliquis   Past Medical History:  Diagnosis Date   Chronic pain    Depression    Diabetes mellitus without complication (HCC)    GERD (gastroesophageal reflux disease)    History of TIA (transient ischemic attack)    Hyperlipidemia    Hyperopia of both eyes with astigmatism and presbyopia 12/21/2019   Hypertension    Lyme disease    Paroxysmal atrial fibrillation (HCC)    Sleep apnea    Thyroid disease    Past Surgical History:  Procedure Laterality Date   ATRIAL FIBRILLATION ABLATION     x2 in [place] N/A 03/05/2021   Procedure: CARDIOVERSION;  Surgeon: Freada Bergeron, MD;  Location: [facility] ENDOSCOPY;  Service: Cardiovascular;  Laterality: N/A;   CERVICAL SPINE SURGERY     implantable loop recorder placement  11/07/2017   MDT LINQ1 implanted in [place] for afib management by Dr Candiss Norse   LOOP RECORDER INSERTION N/A 11/06/2021   Procedure: LOOP RECORDER INSERTION;  Surgeon: Melida Quitter, MD;  Location: [facility] CV LAB;  Service: Cardiovascular;  Laterality: N/A;   LOOP RECORDER REMOVAL N/A 11/06/2021   Procedure:  LOOP RECORDER REMOVAL;  Surgeon: Melida Quitter, MD;  Location: [facility] CV LAB;  Service: Cardiovascular;  Laterality: N/A;   LUMBAR SPINE SURGERY     TONSILLECTOMY AND ADENOIDECTOMY      Current Outpatient Medications  Medication Sig Dispense Refill   ACCU-CHEK GUIDE test strip TEST 1 - 2 TIMES DAILY 200 strip 0   Accu-Chek Softclix Lancets lancets CHECK BLOOD SUGAR ONE TO TWO TIMES DAILY. 200 each 2   ALPRAZolam (XANAX) 0.25 MG tablet Take 1 tablet (0.25 mg total) by mouth 2  (two) times daily as needed for anxiety. 20 tablet 0   apixaban (ELIQUIS) 5 MG TABS tablet Take 1 tablet (5 mg total) by mouth 2 (two) times daily. 200 tablet 3   Blood Glucose Monitoring Suppl (ACCU-CHEK GUIDE ME) w/Device KIT Test 1-2 times daily 1 kit 0   diltiazem (DILT-XR) 180 MG 24 hr capsule TAKE 1 CAPSULE BY MOUTH DAILY 100 capsule 1   DULoxetine (CYMBALTA) 60 MG capsule Take 1 capsule (60 mg total) by mouth at bedtime.     ezetimibe (ZETIA) 10 MG tablet TAKE 1 TABLET BY MOUTH DAILY 100 tablet 2   furosemide (LASIX) 20 MG tablet TAKE 0.5 TABLET BY MOUTH DAILY AS NEEDED SWELLING OR WEIGHT GAIN 90 tablet 0   gabapentin (NEURONTIN) 800 MG tablet TAKE 1 TABLET BY MOUTH 3 TIMES  DAILY 270 tablet 3   levothyroxine (SYNTHROID) 75 MCG tablet TAKE 1 TABLET BY MOUTH DAILY  BEFORE BREAKFAST 90 tablet 1   lisinopril (ZESTRIL) 2.5 MG tablet Take 1 tablet (2.5 mg total) by mouth daily. 90 tablet 3   nitroGLYCERIN (NITROSTAT) 0.4 MG SL tablet Place 1 tablet (0.4 mg total) under the tongue every 5 (five) minutes as needed for chest pain. 30 tablet 0   omeprazole (PRILOSEC) 20 MG capsule TAKE 1 CAPSULE BY MOUTH DAILY 100 capsule 1   oxybutynin (DITROPAN-XL) 10 MG 24 hr tablet TAKE 1 TABLET BY MOUTH TWICE  DAILY 200 tablet 2   pravastatin (PRAVACHOL) 40 MG tablet TAKE 1 TABLET BY MOUTH IN THE  EVENING 100 tablet 2   Tetrahydrozoline HCl ([facility] EYE DROPS OP) Place 1 drop into both eyes in the morning, at noon, in the evening, and at bedtime. Dry eyes     tirzepatide (MOUNJARO) 10 MG/0.5ML Pen Inject 10 mg into the skin once a week. 6 mL 5   No current facility-administered medications for this encounter.    Allergies  Allergen Reactions   Atorvastatin Other (See Comments)    Extreme myalgias   Baclofen Swelling   Crestor [Rosuvastatin]     Extreme myalgias   Metformin And Related Nausea Only   Verapamil Swelling   Silicone Itching and Rash   Wellbutrin [Bupropion] Anxiety    Hallucinations      Social History   Socioeconomic History   Marital status: Married    Spouse name: Not on file   Number of children: 5   Years of education: Not on file   Highest education level: Some college, no degree  Occupational History   Not on file  Tobacco Use   Smoking status: Former   Smokeless tobacco: Never   Tobacco comments:    Former smoker 11/12/2020  Vaping Use   Vaping Use: Never used  Substance and Sexual Activity   Alcohol use: Yes    Alcohol/week: 2.0 standard drinks of alcohol    Types: 2 Standard drinks or equivalent per week    Comment: mix drinks  occ.   Drug use: Never   Sexual activity: Not on file  Other Topics Concern   Not on file  Social History Narrative   From [place], married, [place] Witness, new from [place] to [place]  07/2019   Right handed   Some caffeine use    Lives with husband, daughter, and son-in-law   Social Determinants of Health   Financial Resource Strain: [place]  (10/30/2021)   Overall Financial Resource Strain (CARDIA)    Difficulty of Paying Living Expenses: Not hard at all  Food Insecurity: No Food Insecurity (10/30/2021)   Hunger Vital Sign    Worried About Running Out of Food in the Last Year: Never true    [place] in the Last Year: Never true  Transportation Needs: No Transportation Needs (10/30/2021)   PRAPARE - Hydrologist (Medical): No    Lack of Transportation (Non-Medical): No  Physical Activity: Inactive (10/30/2021)   Exercise Vital Sign    Days of Exercise per Week: 0 days    Minutes of Exercise per Session: 0 min  Stress: Stress Concern Present (10/30/2021)   [place]    Feeling of Stress : To some extent  Social Connections: Not on file  Intimate Partner Violence: Not on file     ROS- All systems are reviewed and negative except as per the HPI above.  Physical Exam: Vitals:    03/11/22 1103  Weight: 74.7 kg  Height: '5\' 1"'$  (1.549 m)    GEN- The patient is a well appearing elderly female, alert and oriented x 3 today.   HEENT-head normocephalic, atraumatic, sclera clear, conjunctiva pink, hearing intact, trachea midline. Lungs- Clear to ausculation bilaterally, normal work of breathing Heart- Regular rate and rhythm, no murmurs, rubs or gallops  GI- soft, NT, ND, + BS Extremities- no clubbing, cyanosis, or edema MS- no significant deformity or atrophy Skin- no rash or lesion Psych- euthymic mood, full affect Neuro- strength and sensation are intact   Wt Readings from Last 3 Encounters:  03/11/22 74.7 kg  02/24/22 75.9 kg  02/17/22 77.6 kg    EKG today demonstrates  ST Vent. rate 107 BPM PR interval 156 ms QRS duration 90 ms QT/QTcB 332/443 ms  Echo 01/27/22 demonstrated  1. Left ventricular ejection fraction by 3D volume is 50 %. The left  ventricle has low normal function. The left ventricle has no regional wall  motion abnormalities. Left ventricular diastolic parameters are  indeterminate.   2. Right ventricular systolic function is normal. The right ventricular  size is normal. There is normal pulmonary artery systolic pressure.   3. The mitral valve is normal in structure. No evidence of mitral valve  regurgitation. No evidence of mitral stenosis. Moderate mitral annular  calcification.   4. The aortic valve is normal in structure. Aortic valve regurgitation is  not visualized. Aortic valve sclerosis/calcification is present, without  any evidence of aortic stenosis.   5. The inferior vena cava is normal in size with greater than 50%  respiratory variability, suggesting right atrial pressure of 3 mmHg.   Epic records are reviewed at length today  CHA2DS2-VASc Score = 8  The patient's score is based upon: CHF History: 1 HTN History: 1 Diabetes History: 1 Stroke History: 2 Vascular Disease History: 1 Age Score: 1 Gender Score: 1         ASSESSMENT AND PLAN: 1. Persistent Atrial  Fibrillation (ICD10:  I48.19) The patient's CHA2DS2-VASc score is 8, indicating a 10.8% annual risk of stroke.   S/p afib ablation in [place] x 2 2019. ILR replacement 11/06/21, continues to show 0% afib burden. Patient still having frequent palpitations. ILR unrevealing despite recent adjustment to settings. Could consider increasing diltiazem although I worry about orthostatic symptoms. Will start diltiazem 30 mg PRN q 4 hours for heart racing.  Continue diltiazem 180 mg daily  Continue Eliquis 5 mg BID Check TSH today.   2. Secondary Hypercoagulable State (ICD10:  D68.69) The patient is at significant risk for stroke/thromboembolism based upon her CHA2DS2-VASc Score of 8.  Continue Apixaban (Eliquis).   3. OSA Patient is not on oral device or CPAP.  4. HTN Mildly elevated today, has had medications recently reduced due to orthostatic hypotension.   5. CAD Calcium score 111, 67th percentile No anginal symptoms.  6. Chronic diastolic CHF Fluid status appears stable today.    Follow up with Caron Presume as scheduled. AF clinic in 6 months.    [facility] [address] [place], [place] [address] [phone] 03/11/2022 11:22 AM

## 2022-03-11 NOTE — Patient Instructions (Signed)
Cardizem 30mg  -- Take 1 tablet every 4 hours AS NEEDED for AFIB heart rate >100 as long as top BP >100.

## 2022-03-12 ENCOUNTER — Telehealth: Payer: Self-pay

## 2022-03-12 NOTE — Telephone Encounter (Signed)
Alert [employer]   ILR alert summary report received. Battery status OK. Normal device function. No new symptom, brady, or pause episodes. No new AF episodes. Monthly summary reports and ROV/PRN 13 logged tachy events 3/7, longest duration 25mn, mean HR's 133-154 - route to triage  LVM for patient to call device back, reviewed with RAdline Pealsin AF clinic. ECGs appear SVT not AF with RVR.   '30mg'$  PRN Cardizem was added to medication regime on 03/11/22.  " see if she noticed her episodes and if she took the dPerry Her TSH yesterday was normal. Probably needs a visit with [facility] or EP APP at this point with her frequent symptomatic SVT"   Per RAdline Peals

## 2022-03-15 NOTE — Telephone Encounter (Signed)
Loop recorder report reviewed with Dr. Myles Gip today.  Per Dr. Myles Gip, appears Pt is having afib, aflutter and atrial tach.  He recommends starting an AAD-flecainide, Multaq.  Pt scheduled to see RU this week to discuss.

## 2022-03-15 NOTE — Progress Notes (Unsigned)
Cardiology Office Note Date:  03/17/2022  Patient ID:  Dessiree, Portalatin 28-Sep-1945, MRN [id number] PCP:  [name], PA-C  Cardiologist:  Dr. Stanford Breed Electrophysiologist: Dr. Myles Gip    Chief Complaint:  SVT  History of Present Illness: ITZELLE HERDEGEN is a 77 y.o. female with history of sleep apnea, HTN, HLD, DM, hypothyroidism, TIA, AFib   She saw Dr. Myles Gip 10/26/21, reported rare AFib symptoms, planned to have her ILR removed and replaced to continue to monitor her AFib burden/management.  Maintained on Eliquis.  No changes were made.  She has seen AFib clinic and Dr. Stanford Breed a couple times since then.  Dr. Stanford Breed 02/24/22, had lost weight, reported a syncopal episode a month earlier, this has happened in the past, typically after standing up or when up/ambulating, reported that she will typically be unconscious for 109mnutes though.   Her BP low and coreg stopped, lisinopril reduced from '10mg'$   >> 2.'5mg'$ . Dilt continued.  She saw R. Fenton, PA-C 03/11/22, reported times of feeling her HR fast, loop noted longest duration of tachycardia 8 minutes PRN diltiazem prescribed  Subsequently AFib/ATach/flutter noted via device remote, Dr. MMyles Gipadvised she be brought in for discussion/adding AAD suggested multaq vs flecainide  TODAY She reports about 2 weeks ago starting to feel her heart racing, once seemed to last nearly an hour or so. She reached out to the office, noted to have developed an AT vs Aflutter with RVR, Rx PRN dilt that she has not felt the need to use yet since it was sent in.  She had not been bothered by AFib, palpitations in quite a longtime, suddenly a couple weeks ago started to notice racing, makes her feel weak, poorly, mostly lasting moments, at least once she felt like it last about 1- 1.5 hours No clear provoking factor, no new meds, life stressors, fever, illnesses. No particular activity sets it off  She has some degree of CP, this goes back  to the days when living in [place] she reminisces about that time in her life, felt like she suffered so long before anyone found her AFib and got help, so many ER visits, describes eventually so unstable she didn't know if she was going to make it. This has made an impact on her and the palpitations, racing not only make her feel generally poorly but very anxiety provoking as well.  She has had more of the chest pressure, though after/during the tachycardia, aware of a heaviness. Otherwise the behavior of her chest discomfort is the same as it has been for many years  She will occasionally feel a little dizzy upon standing, no near syncope or syncope. No clear connection to the palpitations, not new, not changing. She reports her BP can get pretty low, has troble recalling, but says once her PMD got [id number] and had to help her to her car, unclear when that was..    Device information Diagnosed AFib 2019 Notes report 2 prior [place] ablation [place] MDT loop recorder implanted 11/06/21, AFib surveillance    Past Medical History:  Diagnosis Date   Chronic pain    Depression    Diabetes mellitus without complication (HCC)    GERD (gastroesophageal reflux disease)    History of TIA (transient ischemic attack)    Hyperlipidemia    Hyperopia of both eyes with astigmatism and presbyopia 12/21/2019   Hypertension    Lyme disease    Paroxysmal atrial fibrillation ([place]  Sleep apnea    Thyroid disease     Past Surgical History:  Procedure Laterality Date   ATRIAL FIBRILLATION ABLATION     x2 in [place] N/A 03/05/2021   Procedure: CARDIOVERSION;  Surgeon: Freada Bergeron, MD;  Location: [facility] ENDOSCOPY;  Service: Cardiovascular;  Laterality: N/A;   CERVICAL SPINE SURGERY     implantable loop recorder placement  11/07/2017   MDT LINQ1 implanted in [place] for afib management by Dr Candiss Norse   LOOP RECORDER INSERTION N/A 11/06/2021   Procedure: LOOP  RECORDER INSERTION;  Surgeon: Melida Quitter, MD;  Location: [facility] CV LAB;  Service: Cardiovascular;  Laterality: N/A;   LOOP RECORDER REMOVAL N/A 11/06/2021   Procedure: LOOP RECORDER REMOVAL;  Surgeon: Melida Quitter, MD;  Location: [facility] LAB;  Service: Cardiovascular;  Laterality: N/A;   LUMBAR SPINE SURGERY     TONSILLECTOMY AND ADENOIDECTOMY      Current Outpatient Medications  Medication Sig Dispense Refill   ACCU-CHEK GUIDE test strip TEST 1 - 2 TIMES DAILY 200 strip 0   Accu-Chek Softclix Lancets lancets CHECK BLOOD SUGAR ONE TO TWO TIMES DAILY. 200 each 2   ALPRAZolam (XANAX) 0.25 MG tablet Take 1 tablet (0.25 mg total) by mouth 2 (two) times daily as needed for anxiety. 20 tablet 0   apixaban (ELIQUIS) 5 MG TABS tablet Take 1 tablet (5 mg total) by mouth 2 (two) times daily. 200 tablet 3   Blood Glucose Monitoring Suppl (ACCU-CHEK GUIDE ME) w/Device KIT Test 1-2 times daily 1 kit 0   diltiazem (CARDIZEM) 30 MG tablet Take 1 tablet every 4 hours AS NEEDED for AFIB heart rate >100 as long as top BP >100. 30 tablet 1   diltiazem (DILT-XR) 180 MG 24 hr capsule TAKE 1 CAPSULE BY MOUTH DAILY 100 capsule 1   DULoxetine (CYMBALTA) 60 MG capsule Take 1 capsule (60 mg total) by mouth at bedtime.     ezetimibe (ZETIA) 10 MG tablet TAKE 1 TABLET BY MOUTH DAILY 100 tablet 2   furosemide (LASIX) 20 MG tablet TAKE 0.5 TABLET BY MOUTH DAILY AS NEEDED SWELLING OR WEIGHT GAIN 90 tablet 0   gabapentin (NEURONTIN) 800 MG tablet TAKE 1 TABLET BY MOUTH 3 TIMES  DAILY 270 tablet 3   levothyroxine (SYNTHROID) 75 MCG tablet TAKE 1 TABLET BY MOUTH DAILY  BEFORE BREAKFAST 90 tablet 1   lisinopril (ZESTRIL) 2.5 MG tablet Take 1 tablet (2.5 mg total) by mouth daily. 90 tablet 3   nitroGLYCERIN (NITROSTAT) 0.4 MG SL tablet Place 1 tablet (0.4 mg total) under the tongue every 5 (five) minutes as needed for chest pain. 30 tablet 0   omeprazole (PRILOSEC) 20 MG capsule TAKE 1 CAPSULE BY MOUTH  DAILY 100 capsule 1   oxybutynin (DITROPAN-XL) 10 MG 24 hr tablet TAKE 1 TABLET BY MOUTH TWICE  DAILY 200 tablet 2   pravastatin (PRAVACHOL) 40 MG tablet TAKE 1 TABLET BY MOUTH IN THE  EVENING 100 tablet 2   Tetrahydrozoline HCl ([facility] EYE DROPS OP) Place 1 drop into both eyes in the morning, at noon, in the evening, and at bedtime. Dry eyes     tirzepatide (MOUNJARO) 10 MG/0.5ML Pen Inject 10 mg into the skin once a week. 6 mL 5   No current facility-administered medications for this visit.    Allergies:   Atorvastatin, Baclofen, Crestor [rosuvastatin], Metformin and related, Verapamil, Silicone, and Wellbutrin [bupropion]   Social  History:  The patient  reports that she has quit smoking. She has never used smokeless tobacco. She reports current alcohol use of about 2.0 standard drinks of alcohol per week. She reports that she does not use drugs.   Family History:  The patient's family history includes CAD in her father and mother; Hypothyroidism in her daughter; Thyroid disease in her daughter and mother.  ROS:  Please see the history of present illness.    All other systems are reviewed and otherwise negative.   PHYSICAL EXAM:  VS:  BP 126/70   Pulse 98   Ht '5\' 1"'$  (1.549 m)   Wt 162 lb 12.8 oz (73.8 kg)   SpO2 98%   BMI 30.76 kg/m  BMI: Body mass index is 30.76 kg/m. Well nourished, well developed, in no acute distress HEENT: normocephalic, atraumatic Neck: no JVD, carotid bruits or masses Cardiac:  RRR; no significant murmurs, no rubs, or gallops Lungs:  CTA b/l, no wheezing, rhonchi or rales Abd: soft, nontender MS: no deformity or atrophy Ext: no edema Skin: warm and dry, no rash Neuro:  No gross deficits appreciated Psych: euthymic mood, full affect  ILR site is stable, no tethering or discomfort   EKG:  Done today and reviewed by myself shows  SR 95bpm, no new/ischemic changes, normal  Device interrogation done today and reviewed by myself:  Battery is good R  waves 0.43m AF burden is zero New + tachy episodes These are an SVT, appear to be an AT, are fast  Durations generally fairly brief a minute or couple minutes. Some towards 20-25 minutes  01/27/22: TTE 1. Left ventricular ejection fraction by 3D volume is 50 %. The left  ventricle has low normal function. The left ventricle has no regional wall  motion abnormalities. Left ventricular diastolic parameters are  indeterminate.   2. Right ventricular systolic function is normal. The right ventricular  size is normal. There is normal pulmonary artery systolic pressure.   3. The mitral valve is normal in structure. No evidence of mitral valve  regurgitation. No evidence of mitral stenosis. Moderate mitral annular  calcification.   4. The aortic valve is normal in structure. Aortic valve regurgitation is  not visualized. Aortic valve sclerosis/calcification is present, without  any evidence of aortic stenosis.   5. The inferior vena cava is normal in size with greater than 50%  respiratory variability, suggesting right atrial pressure of 3 mmHg.    09/22/20: Coronary CT IMPRESSION: Poor quality study with significant motion artifact in all phases   1. 3 vessel coronary calcium which is 67 th percentile for age/sex 2.  Normal aortic root 3.4 cm 3.  CAD RADS 1 non obstructive CAD see description above 4.  No PFO noted   06/22/19: stress myoview The left ventricular ejection fraction is hyperdynamic (>65%). Nuclear stress EF: 72%. There was no ST segment deviation noted during stress. The study is normal. This is a low risk study.   Recent Labs: 07/02/2021: ALT 18; BUN 15; Creatinine, Ser 0.75; Potassium 4.4; Sodium 141 03/11/2022: TSH 1.090  07/02/2021: Chol/HDL Ratio 3.7; Cholesterol, Total 128; HDL 35; LDL Chol Calc (NIH) 68; Triglycerides 142   CrCl cannot be calculated (Patient's most recent lab result is older than the maximum 21 days allowed.).   Wt Readings from Last 3  Encounters:  03/17/22 162 lb 12.8 oz (73.8 kg)  03/11/22 164 lb 9.6 oz (74.7 kg)  02/24/22 167 lb 6.4 oz (75.9 kg)  Other studies reviewed: Additional studies/records reviewed today include: summarized above  ASSESSMENT AND PLAN:  Paroxysmal Afib CHA2DS2Vasc is 5, on Eliquis, appropriately dosed Non-obstructive CAD by CT 2022, no ischemia/infarct on stress test 2021 zero % burden    2. HTN 3. Orthostatic hypotension w/syncope No near syncope or syncope, occasional orthostatic lightheadedness She sees her PMD In 2 weeks, she will keep a BP log   4. SVT This is new We discussed addition of AAD, she is reluctant to escalate medications, feels most comfortable titrating her diltiazem and using the PRN Will stop the lisinopril, increase her dilt to '240mg'$  daily We talked about adding Multaq next. I will ask the device clinic to get a transmission in 2 weeks, she will let us know how she thinks it is working  5. CP Chronic sounding Coronary CT reviewed Low suspicion of coronary She sees cardiology team in a month     Disposition: F/u with EP in 6 weeks or so, sooner if needed  Current medicines are reviewed at length with the patient today.  The patient did not have any concerns regarding medicines.  Venetia Night, PA-C 03/17/2022 8:31 AM     [place]  [place] [address] [phone] (office)  [phone] (fax)

## 2022-03-17 ENCOUNTER — Ambulatory Visit: Payer: Medicare Other | Attending: Physician Assistant | Admitting: Physician Assistant

## 2022-03-17 ENCOUNTER — Encounter: Payer: Self-pay | Admitting: Physician Assistant

## 2022-03-17 VITALS — BP 126/70 | HR 98 | Ht 61.0 in | Wt 162.8 lb

## 2022-03-17 DIAGNOSIS — I951 Orthostatic hypotension: Secondary | ICD-10-CM | POA: Diagnosis not present

## 2022-03-17 DIAGNOSIS — I480 Paroxysmal atrial fibrillation: Secondary | ICD-10-CM

## 2022-03-17 DIAGNOSIS — I5032 Chronic diastolic (congestive) heart failure: Secondary | ICD-10-CM

## 2022-03-17 DIAGNOSIS — I10 Essential (primary) hypertension: Secondary | ICD-10-CM | POA: Diagnosis not present

## 2022-03-17 DIAGNOSIS — R079 Chest pain, unspecified: Secondary | ICD-10-CM | POA: Diagnosis not present

## 2022-03-17 DIAGNOSIS — Z4509 Encounter for adjustment and management of other cardiac device: Secondary | ICD-10-CM | POA: Diagnosis not present

## 2022-03-17 DIAGNOSIS — I471 Supraventricular tachycardia, unspecified: Secondary | ICD-10-CM

## 2022-03-17 LAB — CUP PACEART INCLINIC DEVICE CHECK
Date Time Interrogation Session: 20240313124845
Implantable Pulse Generator Implant Date: 20231103

## 2022-03-17 MED ORDER — DILTIAZEM HCL ER 240 MG PO CP24: ORAL_CAPSULE | ORAL | 3 refills | Status: DC

## 2022-03-17 MED ORDER — DILTIAZEM HCL ER 240 MG PO CP24: ORAL_CAPSULE | ORAL | 0 refills | Status: DC

## 2022-03-17 NOTE — Patient Instructions (Addendum)
Medication Instructions:   STOP TAKING: LISINOPRIL   START TAKING: DILTIAZEM 240 MG ONCE A DAY   *If you need a refill on your cardiac medications before your next appointment, please call your pharmacy*   Lab Work: NONE ORDERED  TODAY    If you have labs (blood work) drawn today and your tests are completely normal, you will receive your results only by: [place] (if you have MyChart) OR A paper copy in the mail If you have any lab test that is abnormal or we need to change your treatment, we will call you to review the results.   Testing/Procedures: NONE ORDERED  TODAY    Follow-Up: At [facility], you and your health needs are our priority.  As part of our continuing mission to provide you with exceptional heart care, we have created designated Provider Care Teams.  These Care Teams include your primary Cardiologist (physician) and Advanced Practice Providers (APPs -  Physician Assistants and Nurse Practitioners) who all work together to provide you with the care you need, when you need it.  We recommend signing up for the patient portal called "MyChart".  Sign up information is provided on this After Visit Summary.  MyChart is used to connect with patients for Virtual Visits (Telemedicine).  Patients are able to view lab/test results, encounter notes, upcoming appointments, etc.  Non-urgent messages can be sent to your provider as well.   To learn more about what you can do with MyChart, go to [email or web address].    Your next appointment:   6-8 week(s)  Provider:   You may see Melida Quitter, MD or one of the following Advanced Practice Providers on your designated Care Team:   Tommye Standard, [place]    Other Instructions:

## 2022-03-18 DIAGNOSIS — M5412 Radiculopathy, cervical region: Secondary | ICD-10-CM | POA: Diagnosis not present

## 2022-03-19 NOTE — Progress Notes (Signed)
[facility] Summary Report / Loop Recorder 

## 2022-03-22 ENCOUNTER — Other Ambulatory Visit: Payer: Self-pay | Admitting: Medical

## 2022-03-22 ENCOUNTER — Telehealth: Payer: Self-pay

## 2022-03-22 ENCOUNTER — Ambulatory Visit (INDEPENDENT_AMBULATORY_CARE_PROVIDER_SITE_OTHER): Payer: Medicare Other

## 2022-03-22 DIAGNOSIS — I480 Paroxysmal atrial fibrillation: Secondary | ICD-10-CM | POA: Diagnosis not present

## 2022-03-22 MED ORDER — MOUNJARO 10 MG/0.5ML ~~LOC~~ SOAJ: 10.0000 mg | SUBCUTANEOUS | 2 refills | Status: DC

## 2022-03-22 NOTE — Telephone Encounter (Signed)
I called pt and asked her if she could have this sent to a different pharmacy other than [facility] and [facility], I asked [facility] to [facility] and see [facility] in stock before you switch her to something else since it is working for her.  She will call back

## 2022-03-22 NOTE — Telephone Encounter (Addendum)
The following alerts received today from [facility]:   ILR alert report received. Battery status OK. Normal device function. No new symptom, brady, or pause episodes. No new AF episodes. AF burden is 0% of the time.  There were 4 tachy episodes detected. See trends showing increased heart rates, presenting ST.   Sent to triage. Monthly summary reports and ROV/PRN Kathy Breach, RN, CCDS, [facility]  ILR alert report received. Battery status OK. Normal device function. No new symptom,  brady, or pause episodes. No new AF episodes. AF burden is 1.6% of the time.   There were 33 tachycardia events detected, sent to triage.  Monthly summary reports and ROV/PRN Kathy Breach, RN, CCDS, [employer]  Spoke with patient:  Patient continues with frequent tachycardic episodes.  She says that she continues to feel heart flutter and has some dizziness but overall better than last week.  Tommye Standard, PA-C saw patient on 03/17/22 and increased her diltiazem to 240mg  daily and recheck transmission in 2 weeks from increase.  Patient confirms she is taking it every day.  (Note: Carvedilol was d/ced due to concerns for hypotension. Patient's bp today is high 177/86.)  Forwarding to Dr. Myles Gip for further review.  Plans are to recheck transmission in 1 week.   Current ILR report as of this am:

## 2022-03-22 NOTE — Telephone Encounter (Signed)
Pt called advising her pharmacy does not have mounjaro available. [facility] is asking to have a similar injectable medication sent to them as a substitute until they are able to get mounjaro in stock.

## 2022-03-23 NOTE — Telephone Encounter (Signed)
Spoke to patient, yes, she is agreeable for medication changes including AAD. Advised I will forward to Dr. Myles Gip and we will follow up once we hear back. Patient voiced understanding and appreciative of call.

## 2022-03-24 ENCOUNTER — Other Ambulatory Visit (HOSPITAL_COMMUNITY): Payer: Self-pay

## 2022-03-24 ENCOUNTER — Telehealth: Payer: Self-pay | Admitting: Medical

## 2022-03-24 ENCOUNTER — Other Ambulatory Visit: Payer: Self-pay

## 2022-03-24 LAB — CUP PACEART REMOTE DEVICE CHECK
Date Time Interrogation Session: 20240317231952
Implantable Pulse Generator Implant Date: 20231103

## 2022-03-24 MED ORDER — MOUNJARO 10 MG/0.5ML ~~LOC~~ SOAJ
10.0000 mg | SUBCUTANEOUS | 2 refills | Status: DC
  Filled 2022-03-24: qty 6, 84d supply, fill #0

## 2022-03-24 NOTE — Telephone Encounter (Signed)
I called three different pharmacies per Sandra Ruiz to see if they have Mounjaro 10mg . [facility] it so I did send it there and let the pt. Know it was sent there.

## 2022-03-24 NOTE — Telephone Encounter (Signed)
Pt left message that she called around to [facility] & no one has the [facility] & wanted to know what to do.  I called her back & left message,  need to know if she can get it filled at other pharmac[facility] besides [facility].  She can try [facility] & also [facility] t# [phone], please let her know when she calls back

## 2022-03-25 ENCOUNTER — Other Ambulatory Visit: Payer: Self-pay | Admitting: Medical

## 2022-03-25 MED ORDER — MULTAQ 400 MG PO TABS: 400.0000 mg | ORAL_TABLET | Freq: Two times a day (BID) | ORAL | 1 refills | Status: DC

## 2022-03-25 NOTE — Telephone Encounter (Signed)
LM for patient to call back and discuss starting Multaq 400mg  bid.  Patient will also need to be scheduled for an EKG on the nurse visit 1 week after starting medication for monitoring.

## 2022-03-25 NOTE — Telephone Encounter (Signed)
Pt has upcoming appointment in April

## 2022-03-25 NOTE — Telephone Encounter (Signed)
Patient returned call. Discussed Mutaq initiation. RX sent in to pharmacy. EKG NV set up for 04/07/22.  Patient has follow up with Tommye Standard PA-C on 04/29/22.

## 2022-04-02 ENCOUNTER — Other Ambulatory Visit (HOSPITAL_COMMUNITY): Payer: Self-pay | Admitting: Physician Assistant

## 2022-04-03 ENCOUNTER — Other Ambulatory Visit: Payer: Self-pay | Admitting: Medical

## 2022-04-06 ENCOUNTER — Telehealth: Payer: Self-pay | Admitting: Medical

## 2022-04-06 NOTE — Telephone Encounter (Signed)
Pt called and wanted me to inform yall that she has called all pharmacies and they don't have the [facility] she needs and she is wondering of next steps.

## 2022-04-07 ENCOUNTER — Other Ambulatory Visit: Payer: Self-pay | Admitting: Medical

## 2022-04-07 ENCOUNTER — Other Ambulatory Visit (HOSPITAL_COMMUNITY): Payer: Self-pay

## 2022-04-07 ENCOUNTER — Ambulatory Visit: Payer: Medicare Other

## 2022-04-07 ENCOUNTER — Encounter: Payer: Medicare Other | Admitting: Medical

## 2022-04-07 MED ORDER — OZEMPIC (2 MG/DOSE) 8 MG/3ML ~~LOC~~ SOPN
2.0000 mg | PEN_INJECTOR | SUBCUTANEOUS | 2 refills | Status: DC
  Filled 2022-04-07: qty 3, 28d supply, fill #0

## 2022-04-07 MED ORDER — SEMAGLUTIDE (1 MG/DOSE) 4 MG/3ML ~~LOC~~ SOPN
1.0000 mg | PEN_INJECTOR | SUBCUTANEOUS | 2 refills | Status: DC
  Filled 2022-04-07: qty 3, 28d supply, fill #0

## 2022-04-07 NOTE — Telephone Encounter (Signed)
[facility] [place] was sent in. Ozempic and Truicity is what he has in stock. Mounjaro 10mg  and higher they have nothing.

## 2022-04-07 NOTE — Telephone Encounter (Signed)
Left message for pt to call me back 

## 2022-04-10 ENCOUNTER — Other Ambulatory Visit (HOSPITAL_COMMUNITY): Payer: Self-pay | Admitting: Physician Assistant

## 2022-04-12 ENCOUNTER — Ambulatory Visit: Payer: Medicare Other | Attending: Cardiovascular Disease | Admitting: *Deleted

## 2022-04-12 VITALS — BP 132/70 | HR 94 | Resp 20 | Wt 158.0 lb

## 2022-04-12 DIAGNOSIS — I5032 Chronic diastolic (congestive) heart failure: Secondary | ICD-10-CM

## 2022-04-12 DIAGNOSIS — I480 Paroxysmal atrial fibrillation: Secondary | ICD-10-CM | POA: Diagnosis not present

## 2022-04-12 NOTE — Progress Notes (Signed)
   Nurse Visit   Date of Encounter: 04/12/2022 ID: Sandra Ruiz, DOB 1945/07/16, MRN [id number]  PCP:  [facility] Providers Cardiologist:  Olga Millers, MD Electrophysiologist:  Maurice Small, MD { }     Visit Details   VS:  BP 132/70 (BP Location: Right Arm)   Pulse 94   Resp 20   Wt 158 lb 0.2 oz (71.7 kg)   SpO2 99%   BMI 29.86 kg/m  , BMI Body mass index is 29.86 kg/m.  Wt Readings from Last 3 Encounters:  04/12/22 158 lb 0.2 oz (71.7 kg)  03/17/22 162 lb 12.8 oz (73.8 kg)  03/11/22 164 lb 9.6 oz (74.7 kg)     Reason for visit: RN VISIT / EKG  Performed today:  WT, Vitals, EKG, Provider consulted:DOD, Dr. Dayle Points, and Education Changes (medications, testing, etc.) : Multaq 400 Mg, BID Length of Visit: 30 minutes    Medications Adjustments/Labs and Tests Ordered: Pt arrived at 200 pm on 04/12/2022 for her RN / EKG visit.  Pt recently started Multaq 400 mg, BID, per Dr. Nelly Laurence and Francis Dowse PA-C.  Pt stated her RN / EKG visit was originally scheduled for 04/07/2022, but her husband was ill, and had to reschedule this appointment.  Pt weight and vital signs were obtained, and were WNL.  Pt EKG done in Pod N, room 1.  Pt EKG preformed reflected Normal sinus rhythm;  Vent rate 88 Bpm, PR interval 166 ms, QRS duration 84 ms, QT/Qtc 360/435 ms.  EKG taken to DOD, Dr. Excell Seltzer for review, and no new orders received.  I will forward report to Ms. Francis Dowse PA-C;  Pt advised to maintain her current plan of care;  Pt has an appointment on 04/29/2022 to see Ms. Keitha Butte PA-C.  Pt stated she is having difficulties with her insurance company approving Diltiazem and Multaq; Will send message for follow up with insurance company.      Signed, Elmore Guise, RN  04/12/2022 2:46 PM

## 2022-04-14 ENCOUNTER — Other Ambulatory Visit: Payer: Self-pay | Admitting: Medical

## 2022-04-14 ENCOUNTER — Telehealth: Payer: Self-pay | Admitting: Pharmacist Clinician (PhC)/ Clinical Pharmacy Specialist

## 2022-04-14 ENCOUNTER — Encounter: Payer: Medicare Other | Admitting: Medical

## 2022-04-14 MED ORDER — MULTAQ 400 MG PO TABS: 400.0000 mg | ORAL_TABLET | Freq: Two times a day (BID) | ORAL | 0 refills | Status: DC

## 2022-04-14 NOTE — Telephone Encounter (Signed)
Spoke with patient.  Diltiazem should be coming to her from Mail order pharmacy..  She has not heard about Multaq.  Will give 15 day supply of samples, as she has follow up appointment at [facility] with Juanda Crumble PA in 10 days.   They can take care of refills at that time.

## 2022-04-14 NOTE — Telephone Encounter (Signed)
-----   Message from Florina Ou sent at 04/12/2022  5:57 PM EDT ----- Regarding: FW: Insurance coverage medication concern Shooting this message over to the PharmD Pool.   Thank you,  Marchelle Folks  ----- Message ----- From: Elmore Guise, RN Sent: 04/12/2022   3:21 PM EDT To: Florina Ou Subject: Insurance coverage medication concern          Good afternoon Ms. Hepler,  I saw this Pt for an RN / EKG visit this afternoon.  She said that her insurance company is only giving her a months worth of dilitazem and Multaq.  She wondered if someone could help her with this concern, and find out why they only approved 1 month?  If you cannot shed any light on her concern, could you forward my message to someone that can help?  Thank you! Weston Brass RN

## 2022-04-15 DIAGNOSIS — Z7984 Long term (current) use of oral hypoglycemic drugs: Secondary | ICD-10-CM | POA: Diagnosis not present

## 2022-04-15 DIAGNOSIS — H5203 Hypermetropia, bilateral: Secondary | ICD-10-CM | POA: Diagnosis not present

## 2022-04-15 DIAGNOSIS — E119 Type 2 diabetes mellitus without complications: Secondary | ICD-10-CM | POA: Diagnosis not present

## 2022-04-15 DIAGNOSIS — H0100A Unspecified blepharitis right eye, upper and lower eyelids: Secondary | ICD-10-CM | POA: Diagnosis not present

## 2022-04-15 DIAGNOSIS — H524 Presbyopia: Secondary | ICD-10-CM | POA: Diagnosis not present

## 2022-04-15 DIAGNOSIS — H0100B Unspecified blepharitis left eye, upper and lower eyelids: Secondary | ICD-10-CM | POA: Diagnosis not present

## 2022-04-15 DIAGNOSIS — H43393 Other vitreous opacities, bilateral: Secondary | ICD-10-CM | POA: Diagnosis not present

## 2022-04-15 DIAGNOSIS — H40023 Open angle with borderline findings, high risk, bilateral: Secondary | ICD-10-CM | POA: Diagnosis not present

## 2022-04-15 DIAGNOSIS — H52203 Unspecified astigmatism, bilateral: Secondary | ICD-10-CM | POA: Diagnosis not present

## 2022-04-15 DIAGNOSIS — H04123 Dry eye syndrome of bilateral lacrimal glands: Secondary | ICD-10-CM | POA: Diagnosis not present

## 2022-04-16 ENCOUNTER — Telehealth: Payer: Self-pay

## 2022-04-16 NOTE — Telephone Encounter (Signed)
Pt contacted to follow up with message received from PharmD.   Pt stated she received her Multaq, but has not received the diltiazem medication yet.  Pt made aware of order submitted to [facility].  Pt appreciated call, and will look for her medication.

## 2022-04-16 NOTE — Telephone Encounter (Signed)
-----   Message from Tylene Fantasia, [facility] sent at 04/13/2022  1:43 PM EDT ----- Regarding: RE: Insurance coverage medication concern I checked with Personnel officer. The pharmacist at [facility] was able to process 3 months supply of diltiazem 240 mg and will be sending it over to patient. But for Multaq [facility] with 1 refill so if we send the prescription for 90 days they should be able to fill for 90 days.   I hope this helps.   Thanks,  Mal Misty    ----- Message ----- From: Florina Ou Sent: 04/12/2022   6:00 PM EDT To: Elmore Guise, RN; Cv Div Pharmd Subject: FW: Insurance coverage medication concern      Shooting this message over to the PharmD Pool.   Thank you,  Marchelle Folks  ----- Message ----- From: Elmore Guise, RN Sent: 04/12/2022   3:21 PM EDT To: Florina Ou Subject: Insurance coverage medication concern          Good afternoon Ms. Hepler,  I saw this Pt for an RN / EKG visit this afternoon.  She said that her insurance company is only giving her a months worth of dilitazem and Multaq.  She wondered if someone could help her with this concern, and find out why they only approved 1 month?  If you cannot shed any light on her concern, could you forward my message to someone that can help?  Thank you! Weston Brass RN

## 2022-04-20 NOTE — Progress Notes (Unsigned)
Cardiology Office Note:    Date:  04/21/2022   ID:  Sandra Ruiz, DOB 08-Mar-1945, MRN [id number]  PCP:  [facility] Cardiologist: Olga Millers, MD   Reason for visit: 8-week follow-up  History of Present Illness:    Sandra Ruiz is a 77 y.o. female with a hx of paroxysmal atrial fibrillation status post ablation, cardioversion in March 2023, history of loop recorder, minimal CAD on CTA in 2022, PAD.  She has history of syncopal episodes, occurring after standing or walking.  Loop recorder showed no significant arrhythmias.    Last seen by Dr. Jens Som in February 2024.  Patient states she started Ozempic/Mounjaro in June 2023 and has gone down from 220 pounds to 160 pounds.  She was in normal sinus rhythm.  He stopped carvedilol and decrease lisinopril wondering if low blood pressure contributed to her syncope.  If symptoms persisted, he planned to discontinue lisinopril.  She saw A-fib clinic in March 2024.  Loop recorder interrogation showed SVT (AT vs Aflutter with RVR ).  Her lisinopril was discontinued and her diltiazem was increased to 240 mg daily.  EP is considering adding Multaq if she has persistent symptoms.  Today, patient states she has been on Multaq for 2 weeks as well as higher dose of diltiazem.  She states over the last 2 weeks, she has had a few very brief episodes of palpitations (seconds).  She has only had brief waves of lightheadedness.  She has had stable chest pain, not purely exertional.  She notices more when she lays down.  She takes omeprazole for GERD.  She did take nitro once last 2 weeks she thinks it may help -it is old bottle and she needs refill.  Otherwise she has stable dyspnea on moderate exertion.  She denies PND, orthopnea and significant edema..  She has not use Lasix recently.      Past Medical History:  Diagnosis Date   Chronic pain    Depression    Diabetes mellitus without complication    GERD  (gastroesophageal reflux disease)    History of TIA (transient ischemic attack)    Hyperlipidemia    Hyperopia of both eyes with astigmatism and presbyopia 12/21/2019   Hypertension    Lyme disease    Paroxysmal atrial fibrillation    Sleep apnea    Thyroid disease     Past Surgical History:  Procedure Laterality Date   ATRIAL FIBRILLATION ABLATION     x2 in [place]   CARDIAC CATHETERIZATION     CARDIOVERSION N/A 03/05/2021   Procedure: CARDIOVERSION;  Surgeon: Meriam Sprague, MD;  Location: [facility] ENDOSCOPY;  Service: Cardiovascular;  Laterality: N/A;   CERVICAL SPINE SURGERY     implantable loop recorder placement  11/07/2017   MDT LINQ1 implanted in [place] for afib management by Dr Thedore Mins   LOOP RECORDER INSERTION N/A 11/06/2021   Procedure: LOOP RECORDER INSERTION;  Surgeon: Maurice Small, MD;  Location: [facility] INVASIVE CV LAB;  Service: Cardiovascular;  Laterality: N/A;   LOOP RECORDER REMOVAL N/A 11/06/2021   Procedure: LOOP RECORDER REMOVAL;  Surgeon: Maurice Small, MD;  Location: [facility] INVASIVE CV LAB;  Service: Cardiovascular;  Laterality: N/A;   LUMBAR SPINE SURGERY     TONSILLECTOMY AND ADENOIDECTOMY      Current Medications: Current Meds  Medication Sig   ACCU-CHEK GUIDE test strip TEST 1 - 2 TIMES DAILY   Accu-Chek Softclix Lancets lancets CHECK BLOOD SUGAR ONE TO TWO  TIMES DAILY.   ALPRAZolam (XANAX) 0.25 MG tablet Take 1 tablet (0.25 mg total) by mouth 2 (two) times daily as needed for anxiety.   apixaban (ELIQUIS) 5 MG TABS tablet Take 1 tablet (5 mg total) by mouth 2 (two) times daily.   Blood Glucose Monitoring Suppl (ACCU-CHEK GUIDE ME) w/Device KIT Test 1-2 times daily   diltiazem (CARDIZEM) 30 MG tablet TAKE 1 TABLET BY MOUTH EVERY 4 HOURS AS NEEDED FOR AFIB HEART RATE >100 AS LONG AS TOP BP >100   diltiazem (DILT-XR) 240 MG 24 hr capsule TAKE 1 CAPSULE BY MOUTH DAILY   dronedarone (MULTAQ) 400 MG tablet Take 1 tablet (400 mg total) by mouth 2 (two) times daily with  a meal.   dronedarone (MULTAQ) 400 MG tablet Take 1 tablet (400 mg total) by mouth 2 (two) times daily with a meal.   DULoxetine (CYMBALTA) 60 MG capsule TAKE 1 CAPSULE BY MOUTH TWICE  DAILY   ezetimibe (ZETIA) 10 MG tablet TAKE 1 TABLET BY MOUTH DAILY   furosemide (LASIX) 20 MG tablet TAKE 0.5 TABLET BY MOUTH DAILY AS NEEDED SWELLING OR WEIGHT GAIN   gabapentin (NEURONTIN) 800 MG tablet TAKE 1 TABLET BY MOUTH 3 TIMES  DAILY   levothyroxine (SYNTHROID) 75 MCG tablet TAKE 1 TABLET BY MOUTH DAILY  BEFORE BREAKFAST   omeprazole (PRILOSEC) 20 MG capsule TAKE 1 CAPSULE BY MOUTH DAILY   oxybutynin (DITROPAN-XL) 10 MG 24 hr tablet TAKE 1 TABLET BY MOUTH TWICE  DAILY   pravastatin (PRAVACHOL) 40 MG tablet TAKE 1 TABLET BY MOUTH IN THE  EVENING   Semaglutide, 2 MG/DOSE, (OZEMPIC, 2 MG/DOSE,) 8 MG/3ML SOPN Inject 2 mg into the skin once a week.   Tetrahydrozoline HCl ([facility] EYE DROPS OP) Place 1 drop into both eyes in the morning, at noon, in the evening, and at bedtime. Dry eyes   [DISCONTINUED] nitroGLYCERIN (NITROSTAT) 0.4 MG SL tablet Place 1 tablet (0.4 mg total) under the tongue every 5 (five) minutes as needed for chest pain.     Allergies:   Atorvastatin, Baclofen, Crestor [rosuvastatin], Metformin and related, Verapamil, Silicone, and Wellbutrin [bupropion]   Social History   Socioeconomic History   Marital status: Married    Spouse name: Not on file   Number of children: 5   Years of education: Not on file   Highest education level: Some college, no degree  Occupational History   Not on file  Tobacco Use   Smoking status: Former   Smokeless tobacco: Never   Tobacco comments:    Former smoker 11/12/2020  Vaping Use   Vaping Use: Never used  Substance and Sexual Activity   Alcohol use: Yes    Alcohol/week: 2.0 standard drinks of alcohol    Types: 2 Standard drinks or equivalent per week    Comment: mix drinks occ.   Drug use: Never   Sexual activity: Not on file  Other Topics  Concern   Not on file  Social History Narrative   From [address], married, [place] Witness, new from [place] to [place]  07/2019   Right handed   Some caffeine use    Lives with husband, daughter, and son-in-law   Social Determinants of Health   Financial Resource Strain: Low Risk  (10/30/2021)   Overall Financial Resource Strain (CARDIA)    Difficulty of Paying Living Expenses: Not hard at all  Food Insecurity: No Food Insecurity (10/30/2021)   Hunger Vital Sign    Worried About  Running Out of Food in the Last Year: Never true    Ran Out of Food in the Last Year: Never true  Transportation Needs: No Transportation Needs (10/30/2021)   PRAPARE - Administrator, Civil Service (Medical): No    Lack of Transportation (Non-Medical): No  Physical Activity: Inactive (10/30/2021)   Exercise Vital Sign    Days of Exercise per Week: 0 days    Minutes of Exercise per Session: 0 min  Stress: Stress Concern Present (10/30/2021)   [employer] of Occupational Health - Occupational Stress Questionnaire    Feeling of Stress : To some extent  Social Connections: Not on file     Family History: The patient's family history includes CAD in her father and mother; Hypothyroidism in her daughter; Thyroid disease in her daughter and mother.  ROS:   Please see the history of present illness.     EKGs/Labs/Other Studies Reviewed:    EKG: Recent EKG shows normal sinus rhythm.  Recent Labs: 07/02/2021: ALT 18; BUN 15; Creatinine, Ser 0.75; Potassium 4.4; Sodium 141 03/11/2022: TSH 1.090   Recent Lipid Panel Lab Results  Component Value Date/Time   CHOL 128 07/02/2021 08:43 AM   TRIG 142 07/02/2021 08:43 AM   HDL 35 (L) 07/02/2021 08:43 AM   LDLCALC 68 07/02/2021 08:43 AM    Physical Exam:    VS:  BP 132/72   Pulse 89   Ht 5\' 1"  (1.549 m)   Wt 158 lb 3.2 oz (71.8 kg)   SpO2 97%   BMI 29.89 kg/m    No data found.   Wt Readings from Last 3  Encounters:  04/21/22 158 lb 3.2 oz (71.8 kg)  04/12/22 158 lb 0.2 oz (71.7 kg)  03/17/22 162 lb 12.8 oz (73.8 kg)     GEN:  Well nourished, well developed in no acute distress HEENT: Normal NECK: No JVD; No carotid bruits CARDIAC: RRR, no murmurs, rubs, gallops RESPIRATORY:  Clear to auscultation without rales, wheezing or rhonchi  ABDOMEN: Soft, non-tender, non-distended MUSCULOSKELETAL: No edema SKIN: Warm and dry NEUROLOGIC:  Alert and oriented PSYCHIATRIC:  Normal affect     ASSESSMENT AND PLAN   History of syncope -Lightheadedness improved. -Likely overmedicated following weight loss.  Blood pressure meds reduced. -Loop recorder in place  -Advised to use sublingual nitro cautiously.  Coronary artery disease -CTA done in 2022 following complaints of chest pain.  CTA showed minimal disease. -Atypical chest pain -stable. -Continue lipid therapy.  No aspirin given need for Eliquis.  Persistent atrial fibrillation -Followed by the A-fib clinic -Status post A-fib ablation in [place] x 2 in 2019 -ILR replacement 11/06/21 -Continue Eliquis for stroke prevention.  Chronic diastolic heart failure, euvolemic -Continue Lasix as needed.  Hypertension, well-controlled -Continue current medication. -Goal BP is <130/80.  Recommend DASH diet (high in vegetables, fruits, low-fat dairy products, whole grains, poultry, fish, and nuts and low in sweets, sugar-sweetened beverages, and red meats), salt restriction and increase physical activity.  Hyperlipidemia -Did not tolerate Crestor or Lipitor -Continue pravastatin and Zetia.  Check lipids in July 2024.  Will see if able to take off Zetia if lipids greatly improved with weight loss. -Discussed cholesterol lowering diets - Mediterranean diet, DASH diet, vegetarian diet, low-carbohydrate diet and avoidance of trans fats.  Discussed healthier choice substitutes.  Nuts, high-fiber foods, and fiber supplements may also improve lipids.     History of small secundum ASD-prior echocardiogram did not show RV dilatation  -Monitor  Disposition - Follow-up in 6 months with Dr. Jens Som.   Medication Adjustments/Labs and Tests Ordered: Current medicines are reviewed at length with the patient today.  Concerns regarding medicines are outlined above.  Orders Placed This Encounter  Procedures   Lipid panel   Meds ordered this encounter  Medications   nitroGLYCERIN (NITROSTAT) 0.4 MG SL tablet    Sig: Place 1 tablet (0.4 mg total) under the tongue every 5 (five) minutes as needed for chest pain.    Dispense:  30 tablet    Refill:  0    Patient Instructions  Medication Instructions:  No Changes *If you need a refill on your cardiac medications before your next appointment, please call your pharmacy*   Lab Work: Lipid Panel 3 Months If you have labs (blood work) drawn today and your tests are completely normal, you will receive your results only by: MyChart Message (if you have MyChart) OR A paper copy in the mail If you have any lab test that is abnormal or we need to change your treatment, we will call you to review the results.   Testing/Procedures: No Testing   Follow-Up: At [facility], you and your health needs are our priority.  As part of our continuing mission to provide you with exceptional heart care, we have created designated Provider Care Teams.  These Care Teams include your primary Cardiologist (physician) and Advanced Practice Providers (APPs -  Physician Assistants and Nurse Practitioners) who all work together to provide you with the care you need, when you need it.  We recommend signing up for the patient portal called "MyChart".  Sign up information is provided on this After Visit Summary.  MyChart is used to connect with patients for Virtual Visits (Telemedicine).  Patients are able to view lab/test results, encounter notes, upcoming appointments, etc.  Non-urgent messages can be sent to  your provider as well.   To learn more about what you can do with MyChart, go to [email or web address].    Your next appointment:   6 month(s)  Provider:   Olga Millers, MD     Signed, Cannon Kettle, PA-C  04/21/2022 10:14 AM    [facility]

## 2022-04-21 ENCOUNTER — Encounter: Payer: Self-pay | Admitting: Physician Assistant

## 2022-04-21 ENCOUNTER — Ambulatory Visit: Payer: Medicare Other | Attending: Physician Assistant | Admitting: Physician Assistant

## 2022-04-21 VITALS — BP 132/72 | HR 89 | Ht 61.0 in | Wt 158.2 lb

## 2022-04-21 DIAGNOSIS — I480 Paroxysmal atrial fibrillation: Secondary | ICD-10-CM

## 2022-04-21 DIAGNOSIS — I10 Essential (primary) hypertension: Secondary | ICD-10-CM | POA: Diagnosis not present

## 2022-04-21 DIAGNOSIS — E7800 Pure hypercholesterolemia, unspecified: Secondary | ICD-10-CM | POA: Diagnosis not present

## 2022-04-21 DIAGNOSIS — I5032 Chronic diastolic (congestive) heart failure: Secondary | ICD-10-CM | POA: Diagnosis not present

## 2022-04-21 DIAGNOSIS — I25118 Atherosclerotic heart disease of native coronary artery with other forms of angina pectoris: Secondary | ICD-10-CM

## 2022-04-21 MED ORDER — NITROGLYCERIN 0.4 MG SL SUBL: 0.4000 mg | SUBLINGUAL_TABLET | SUBLINGUAL | 0 refills | Status: DC | PRN

## 2022-04-21 NOTE — Patient Instructions (Signed)
Medication Instructions:  No Changes *If you need a refill on your cardiac medications before your next appointment, please call your pharmacy*   Lab Work: Lipid Panel 3 Months If you have labs (blood work) drawn today and your tests are completely normal, you will receive your results only by: MyChart Message (if you have MyChart) OR A paper copy in the mail If you have any lab test that is abnormal or we need to change your treatment, we will call you to review the results.   Testing/Procedures: No Testing   Follow-Up: At [facility], you and your health needs are our priority.  As part of our continuing mission to provide you with exceptional heart care, we have created designated Provider Care Teams.  These Care Teams include your primary Cardiologist (physician) and Advanced Practice Providers (APPs -  Physician Assistants and Nurse Practitioners) who all work together to provide you with the care you need, when you need it.  We recommend signing up for the patient portal called "MyChart".  Sign up information is provided on this After Visit Summary.  MyChart is used to connect with patients for Virtual Visits (Telemedicine).  Patients are able to view lab/test results, encounter notes, upcoming appointments, etc.  Non-urgent messages can be sent to your provider as well.   To learn more about what you can do with MyChart, go to [email or web address].    Your next appointment:   6 month(s)  Provider:   Olga Millers, MD

## 2022-04-26 ENCOUNTER — Ambulatory Visit (INDEPENDENT_AMBULATORY_CARE_PROVIDER_SITE_OTHER): Payer: Medicare Other

## 2022-04-26 DIAGNOSIS — I480 Paroxysmal atrial fibrillation: Secondary | ICD-10-CM

## 2022-04-27 LAB — CUP PACEART REMOTE DEVICE CHECK
Date Time Interrogation Session: 20240419230841
Implantable Pulse Generator Implant Date: 20231103

## 2022-04-28 ENCOUNTER — Telehealth: Payer: Self-pay | Admitting: Medical

## 2022-04-28 ENCOUNTER — Ambulatory Visit (INDEPENDENT_AMBULATORY_CARE_PROVIDER_SITE_OTHER): Payer: Medicare Other | Admitting: Medical

## 2022-04-28 ENCOUNTER — Encounter: Payer: Self-pay | Admitting: Medical

## 2022-04-28 VITALS — BP 134/76 | HR 83 | Wt 161.4 lb

## 2022-04-28 DIAGNOSIS — L989 Disorder of the skin and subcutaneous tissue, unspecified: Secondary | ICD-10-CM | POA: Diagnosis not present

## 2022-04-28 DIAGNOSIS — E1169 Type 2 diabetes mellitus with other specified complication: Secondary | ICD-10-CM | POA: Diagnosis not present

## 2022-04-28 DIAGNOSIS — Z7185 Encounter for immunization safety counseling: Secondary | ICD-10-CM | POA: Diagnosis not present

## 2022-04-28 LAB — POCT GLYCOSYLATED HEMOGLOBIN (HGB A1C): Hemoglobin A1C: 6.3 % — AB (ref 4.0–5.6)

## 2022-04-28 NOTE — Progress Notes (Unsigned)
Cardiology Office Note Date:  04/28/2022  Patient ID:  Kayler, Buckholtz Aug 17, 1945, MRN [id number] PCP:  Jac Canavan, PA-C  Cardiologist:  Dr. Jens Som Electrophysiologist: Dr. Nelly Laurence    Chief Complaint:  f/u on SVT  History of Present Illness: HARRIETT AZAR is a 77 y.o. female with history of sleep apnea, HTN, HLD, DM, hypothyroidism, TIA, AFib   She saw Dr. Nelly Laurence 10/26/21, reported rare AFib symptoms, planned to have her ILR removed and replaced to continue to monitor her AFib burden/management.  Maintained on Eliquis.  No changes were made.  She has seen AFib clinic and Dr. Jens Som a couple times since then.  Dr. Jens Som 02/24/22, had lost weight, reported a syncopal episode a month earlier, this has happened in the past, typically after standing up or when up/ambulating, reported that she will typically be unconscious for though.   Her BP low and coreg stopped, lisinopril reduced from 10mg   >> 2.5mg . Dilt continued.  She saw R. Fenton, PA-C 03/11/22, reported times of feeling her HR fast, loop noted longest duration of tachycardia 8 minutes PRN diltiazem prescribed  Subsequently AFib/ATach/flutter noted via device remote, Dr. Nelly Laurence advised she be brought in for discussion/adding AAD suggested multaq vs flecainide  I sw her 03/17/22 She reports about 2 weeks ago starting to feel her heart racing, once seemed to last nearly an hour or so. She reached out to the office, noted to have developed an AT vs Aflutter with RVR, Rx PRN dilt that she has not felt the need to use yet since it was sent in. She had not been bothered by AFib, palpitations in quite a longtime, suddenly a couple weeks ago started to notice racing, makes her feel weak, poorly, mostly lasting moments, at least once she felt like it last about 1- 1.5 hours No clear provoking factor, no new meds, life stressors, fever, illnesses. No particular activity sets it off She has some degree of CP,  this goes back to the days when living in [place], she reminisces about that time in her life, felt like she suffered so long before anyone found her AFib and got help, so many ER visits, describes eventually so unstable she didn't know if she was going to make it. This has made an impact on her and the palpitations, racing not only make her feel generally poorly but very anxiety provoking as well. She has had more of the chest pressure, though after/during the tachycardia, aware of a heaviness. Otherwise the behavior of her chest discomfort is the same as it has been for many years She will occasionally feel a little dizzy upon standing, no near syncope or syncope. No clear connection to the palpitations, not new, not changing. She reports her BP can get pretty low, has troble recalling, but says once her PMD got 70's systolic and had to help her to her car, unclear when that was.. + tachy episodes, SVT/AT We discussed starting AAD, she wanted to hold off  Phone notes with recurrent tachycardias, symptoms and started on Multaq 04/12/22 EKG done SR stable findings  Multiple messages pending medications from [place] and PMD, multaq, dilt, monjaro...  She saw Verneita Griffes, PA-C 04/21/22, improved symptoms, a couple very brief episodes of palpitations, dizziness, ome stable sounding CP, discussed caution in s/l NTG and tendency towards hypotenion (since her weight loss) and dizziness.  TODAY She has noticed a clear improvement in how she feels Burden of her palpitations, tachycardias are much  better, if she has had any they have been minimal, and not felt the need to use the PRN diltiazem. No near syncope or syncope. She has some baseline DOE with increased activities, not new, not changing, Pre-dates Multaq, mostly sedentary, walks her dog once a week, doesn't do much else, her husband takes care of most everything. No SOB with ADLs No bleeding or signs of bleeding   AF/AAD hx Diagnosed AFib 2019 Notes  report 2 prior AFib ablation in [place] MDT loop recorder implanted 11/06/21, AFib surveillance  Multaq started April  2024  Past Medical History:  Diagnosis Date   Chronic pain    Depression    Diabetes mellitus without complication    GERD (gastroesophageal reflux disease)    History of TIA (transient ischemic attack)    Hyperlipidemia    Hyperopia of both eyes with astigmatism and presbyopia 12/21/2019   Hypertension    Lyme disease    Paroxysmal atrial fibrillation    Sleep apnea    Thyroid disease     Past Surgical History:  Procedure Laterality Date   ATRIAL FIBRILLATION ABLATION     x2 in [place]   CARDIAC CATHETERIZATION     CARDIOVERSION N/A 03/05/2021   Procedure: CARDIOVERSION;  Surgeon: Meriam Sprague, MD;  [facility] ENDOSCOPY;  Service: Cardiovascular;  Laterality: N/A;   CERVICAL SPINE SURGERY     implantable loop recorder placement  11/07/2017   MDT LINQ1 implanted in [place] for afib management by Dr Thedore Mins   LOOP RECORDER INSERTION N/A 11/06/2021   Procedure: LOOP RECORDER INSERTION;  Surgeon: Maurice Small, MD;  Location: [facility] INVASIVE CV LAB;  Service: Cardiovascular;  Laterality: N/A;   LOOP RECORDER REMOVAL N/A 11/06/2021   Procedure: LOOP RECORDER REMOVAL;  Surgeon: Maurice Small, MD;  Location: [facility] INVASIVE CV LAB;  Service: Cardiovascular;  Laterality: N/A;   LUMBAR SPINE SURGERY     TONSILLECTOMY AND ADENOIDECTOMY      Current Outpatient Medications  Medication Sig Dispense Refill   ACCU-CHEK GUIDE test strip TEST 1 - 2 TIMES DAILY 200 strip 0   Accu-Chek Softclix Lancets lancets CHECK BLOOD SUGAR ONE TO TWO TIMES DAILY. 200 each 2   ALPRAZolam (XANAX) 0.25 MG tablet Take 1 tablet (0.25 mg total) by mouth 2 (two) times daily as needed for anxiety. 20 tablet 0   apixaban (ELIQUIS) 5 MG TABS tablet Take 1 tablet (5 mg total) by mouth 2 (two) times daily. 200 tablet 3   Blood Glucose Monitoring Suppl (ACCU-CHEK GUIDE ME) w/Device KIT Test 1-2 times daily 1  kit 0   diltiazem (CARDIZEM) 30 MG tablet TAKE 1 TABLET BY MOUTH EVERY 4 HOURS AS NEEDED FOR AFIB HEART RATE >100 AS LONG AS TOP BP >100 90 tablet 1   diltiazem (DILT-XR) 240 MG 24 hr capsule TAKE 1 CAPSULE BY MOUTH DAILY 90 capsule 3   dronedarone (MULTAQ) 400 MG tablet Take 1 tablet (400 mg total) by mouth 2 (two) times daily with a meal. 60 tablet 1   dronedarone (MULTAQ) 400 MG tablet Take 1 tablet (400 mg total) by mouth 2 (two) times daily with a meal. 30 tablet 0   DULoxetine (CYMBALTA) 60 MG capsule TAKE 1 CAPSULE BY MOUTH TWICE  DAILY 200 capsule 1   ezetimibe (ZETIA) 10 MG tablet TAKE 1 TABLET BY MOUTH DAILY 100 tablet 2   furosemide (LASIX) 20 MG tablet TAKE 0.5 TABLET BY MOUTH DAILY AS NEEDED SWELLING OR WEIGHT GAIN 90 tablet 0  gabapentin (NEURONTIN) 800 MG tablet TAKE 1 TABLET BY MOUTH 3 TIMES  DAILY 270 tablet 3   levothyroxine (SYNTHROID) 75 MCG tablet TAKE 1 TABLET BY MOUTH DAILY  BEFORE BREAKFAST 100 tablet 0   nitroGLYCERIN (NITROSTAT) 0.4 MG SL tablet Place 1 tablet (0.4 mg total) under the tongue every 5 (five) minutes as needed for chest pain. 30 tablet 0   omeprazole (PRILOSEC) 20 MG capsule TAKE 1 CAPSULE BY MOUTH DAILY 100 capsule 1   oxybutynin (DITROPAN-XL) 10 MG 24 hr tablet TAKE 1 TABLET BY MOUTH TWICE  DAILY 200 tablet 2   pravastatin (PRAVACHOL) 40 MG tablet TAKE 1 TABLET BY MOUTH IN THE  EVENING 100 tablet 2   Semaglutide, 2 MG/DOSE, (OZEMPIC, 2 MG/DOSE,) 8 MG/3ML SOPN Inject 2 mg into the skin once a week. 3 mL 2   Tetrahydrozoline HCl ([facility] EYE DROPS OP) Place 1 drop into both eyes in the morning, at noon, in the evening, and at bedtime. Dry eyes     No current facility-administered medications for this visit.    Allergies:   Atorvastatin, Baclofen, Crestor [rosuvastatin], Metformin and related, Verapamil, Silicone, and Wellbutrin [bupropion]   Social History:  The patient  reports that she has quit smoking. She has never used smokeless tobacco. She reports  current alcohol use of about 2.0 standard drinks of alcohol per week. She reports that she does not use drugs.   Family History:  The patient's family history includes CAD in her father and mother; Hypothyroidism in her daughter; Thyroid disease in her daughter and mother.  ROS:  Please see the history of present illness.    All other systems are reviewed and otherwise negative.   PHYSICAL EXAM:  VS:  There were no vitals taken for this visit. BMI: There is no height or weight on file to calculate BMI. Well nourished, well developed, in no acute distress HEENT: normocephalic, atraumatic Neck: no JVD, carotid bruits or masses Cardiac:   RRR; no significant murmurs, no rubs, or gallops Lungs:  CTA b/l, no wheezing, rhonchi or rales Abd: soft, nontender MS: no deformity or atrophy Ext:  no edema Skin: warm and dry, no rash Neuro:  No gross deficits appreciated Psych: euthymic mood, full affect  ILR site is stable, no tethering or discomfort   EKG:  done today and reviewed by myself SR, PACs, 78bpm  Device interrogation done today and reviewed by myself:  Battery is good  R waves 0.23 + SVT, only 6 since start of drug   01/27/22: TTE 1. Left ventricular ejection fraction by 3D volume is 50 %. The left  ventricle has low normal function. The left ventricle has no regional wall  motion abnormalities. Left ventricular diastolic parameters are  indeterminate.   2. Right ventricular systolic function is normal. The right ventricular  size is normal. There is normal pulmonary artery systolic pressure.   3. The mitral valve is normal in structure. No evidence of mitral valve  regurgitation. No evidence of mitral stenosis. Moderate mitral annular  calcification.   4. The aortic valve is normal in structure. Aortic valve regurgitation is  not visualized. Aortic valve sclerosis/calcification is present, without  any evidence of aortic stenosis.   5. The inferior vena cava is normal in  size with greater than 50%  respiratory variability, suggesting right atrial pressure of 3 mmHg.    09/22/20: Coronary CT IMPRESSION: Poor quality study with significant motion artifact in all phases   1. 3 vessel coronary calcium  which is 43 th percentile for age/sex 2.  Normal aortic root 3.4 cm 3.  CAD RADS 1 non obstructive CAD see description above 4.  No PFO noted   06/22/19: stress myoview The left ventricular ejection fraction is hyperdynamic (>65%). Nuclear stress EF: 72%. There was no ST segment deviation noted during stress. The study is normal. This is a low risk study.   Recent Labs: 07/02/2021: ALT 18; BUN 15; Creatinine, Ser 0.75; Potassium 4.4; Sodium 141 03/11/2022: TSH 1.090  07/02/2021: Chol/HDL Ratio 3.7; Cholesterol, Total 128; HDL 35; LDL Chol Calc (NIH) 68; Triglycerides 142   CrCl cannot be calculated (Patient's most recent lab result is older than the maximum 21 days allowed.).   Wt Readings from Last 3 Encounters:  04/21/22 158 lb 3.2 oz (71.8 kg)  04/12/22 158 lb 0.2 oz (71.7 kg)  03/17/22 162 lb 12.8 oz (73.8 kg)     Other studies reviewed: Additional studies/records reviewed today include: summarized above  ASSESSMENT AND PLAN:  Paroxysmal Afib CHA2DS2Vasc is 5, on Eliquis, appropriately dosed Non-obstructive CAD by CT 2022, no ischemia/infarct on stress test 2021 <1 % burden    2. HTN 3. Orthostatic hypotension w/syncope No near syncope or syncope, occasional orthostatic lightheadedness Recheck today is 130/80   4. SVT multaq Symptomatically feels much better   Seems her pharmacy has not sent her drug, something with insurance, will ask pharmacy to follow up More samples provided today   5. CP Chronic sounding Coronary CT reviewed Low suspicion of coronary etiology Denies any CP episodes today     Disposition: F/u with EP in 55mo, sooner if needed  Current medicines are reviewed at length with the patient today.  The  patient did not have any concerns regarding medicines.  Norma Fredrickson, PA-C 04/28/2022 7:18 AM     [facility] [address] [place] [address] [phone] (office)  [phone] (fax)

## 2022-04-28 NOTE — Telephone Encounter (Signed)
Pt called  She needs rx for new meter and lancets, strips  sent to   [facility]  Fax # [phone]

## 2022-04-28 NOTE — Telephone Encounter (Signed)
Faxed

## 2022-04-28 NOTE — Progress Notes (Signed)
Subjective:  Sandra Ruiz is a 77 y.o. female who presents for Chief Complaint  Patient presents with   Medication Refill    Needs refill of accu check supplies, recently received rx for mounjaro--plans to start after finishing remaining ozempic     Current Health Care Team: Dentist, Dr. Mayford Knife  Eye doctor, Dr. Valentino Hue but can't remember name Dr. Janey Genta, pain management/neurosurgery Dr. Olga Millers, cardiology Dr. Romero Belling, endocrinology Dr. Arlyss Repress, ophthalmology   Here for med check.  Concerns about skin lesion left nose, bleeding at tines. Noticed the lesion 2 months ago.  Diabetes - in about 2 weeks switching to mounjaro.  Been using ozempic but supplies have been limited. She has used mounjaro and likes the device better, and feels better on mounjaro compared to ozempic.  No recent hypoglycemia, no recent foot concerns.    Needs new glucometer and testing supplies  I saw her back in February for some syncope episodes.  At that time we had her temporarily stop lisinopril and Coreg for a few days then had her using carvedilol at a lower dose.  She continues to diltiazem.  We had her hold the Lasix at that time as well.  She did a follow-up with cardiology recently and her medications were adjusted.  She is doing fine now without any more syncope episodes or low blood pressure episodes.  Currently on Diltiazem  daily, lasix prn.     Compliant with Dronedaraone and Eliquis   She is seeing Dr. Lorrine Kin, pain management.  She continues on gabapentin and Cymbalta  She has not gotten tetanus and shingles vaccines through her pharmacy yet  No other aggravating or relieving factors.    No other c/o.  Past Medical History:  Diagnosis Date   Chronic pain    Depression    Diabetes mellitus without complication    GERD (gastroesophageal reflux disease)    History of TIA (transient ischemic attack)    Hyperlipidemia    Hyperopia of both eyes with astigmatism  and presbyopia 12/21/2019   Hypertension    Lyme disease    Paroxysmal atrial fibrillation    Sleep apnea    Thyroid disease    Current Outpatient Medications on File Prior to Visit  Medication Sig Dispense Refill   ACCU-CHEK GUIDE test strip TEST 1 - 2 TIMES DAILY 200 strip 0   Accu-Chek Softclix Lancets lancets CHECK BLOOD SUGAR ONE TO TWO TIMES DAILY. 200 each 2   ALPRAZolam (XANAX) 0.25 MG tablet Take 1 tablet (0.25 mg total) by mouth 2 (two) times daily as needed for anxiety. 20 tablet 0   apixaban (ELIQUIS) 5 MG TABS tablet Take 1 tablet (5 mg total) by mouth 2 (two) times daily. 200 tablet 3   Blood Glucose Monitoring Suppl (ACCU-CHEK GUIDE ME) w/Device KIT Test 1-2 times daily 1 kit 0   diltiazem (CARDIZEM) 30 MG tablet TAKE 1 TABLET BY MOUTH EVERY 4 HOURS AS NEEDED FOR AFIB HEART RATE >100 AS LONG AS TOP BP >100 90 tablet 1   diltiazem (DILT-XR) 240 MG 24 hr capsule TAKE 1 CAPSULE BY MOUTH DAILY 90 capsule 3   dronedarone (MULTAQ) 400 MG tablet Take 1 tablet (400 mg total) by mouth 2 (two) times daily with a meal. 60 tablet 1   DULoxetine (CYMBALTA) 60 MG capsule TAKE 1 CAPSULE BY MOUTH TWICE  DAILY 200 capsule 1   ezetimibe (ZETIA) 10 MG tablet TAKE 1 TABLET BY MOUTH DAILY 100 tablet 2  furosemide (LASIX) 20 MG tablet TAKE 0.5 TABLET BY MOUTH DAILY AS NEEDED SWELLING OR WEIGHT GAIN 90 tablet 0   gabapentin (NEURONTIN) 800 MG tablet TAKE 1 TABLET BY MOUTH 3 TIMES  DAILY 270 tablet 3   levothyroxine (SYNTHROID) 75 MCG tablet TAKE 1 TABLET BY MOUTH DAILY  BEFORE BREAKFAST 100 tablet 0   nitroGLYCERIN (NITROSTAT) 0.4 MG SL tablet Place 1 tablet (0.4 mg total) under the tongue every 5 (five) minutes as needed for chest pain. 30 tablet 0   omeprazole (PRILOSEC) 20 MG capsule TAKE 1 CAPSULE BY MOUTH DAILY 100 capsule 1   oxybutynin (DITROPAN-XL) 10 MG 24 hr tablet TAKE 1 TABLET BY MOUTH TWICE  DAILY 200 tablet 2   pravastatin (PRAVACHOL) 40 MG tablet TAKE 1 TABLET BY MOUTH IN THE   EVENING 100 tablet 2   Semaglutide, 2 MG/DOSE, (OZEMPIC, 2 MG/DOSE,) 8 MG/3ML SOPN Inject 2 mg into the skin once a week. 3 mL 2   Tetrahydrozoline HCl ([facility] EYE DROPS OP) Place 1 drop into both eyes in the morning, at noon, in the evening, and at bedtime. Dry eyes     No current facility-administered medications on file prior to visit.    The following portions of the patient's history were reviewed and updated as appropriate: allergies, current medications, past family history, past medical history, past social history, past surgical history and problem list.  ROS Otherwise as in subjective above   Objective: BP 134/76   Pulse 83   Wt 161 lb 6.4 oz (73.2 kg)   SpO2 99%   BMI 30.50 kg/m   Wt Readings from Last 3 Encounters:  04/28/22 161 lb 6.4 oz (73.2 kg)  04/21/22 158 lb 3.2 oz (71.8 kg)  04/12/22 158 lb 0.2 oz (71.7 kg)   BP Readings from Last 3 Encounters:  04/28/22 134/76  04/21/22 132/72  04/12/22 132/70    General appearance: alert, no distress, well developed, well nourished Neck: supple, no lymphadenopathy, no thyromegaly, no masses Heart: RRR, normal S1, S2, no murmurs Lungs: CTA bilaterally, no wheezes, rhonchi, or rales Pulses: 2+ radial pulses, 1+ pedal pulses, normal cap refill Ext: no edema  Diabetic Foot Exam - Simple   Simple Foot Form Diabetic Foot exam was performed with the following findings: Yes 04/28/2022 11:35 AM  Visual Inspection No deformities, no ulcerations, no other skin breakdown bilaterally: Yes Sensation Testing See comments: Yes Pulse Check See comments: Yes Comments Sensation is poor of both feet, 1+pedal pulses      Assessment: Encounter Diagnoses  Name Primary?   Type 2 diabetes mellitus with other specified complication, without long-term current use of insulin Yes   Skin lesion of face    Vaccine counseling       Plan: Skin lesion, possible BCC - referral to dermatology  Diabetes  New script for glucometer and  testing supplies today Continue GLP1 medication, health eating habits and exercise Stable diabetes marker today  Vaccines: Advised she get Tdap and Shingrix at pharmacy  I reviewed her recent ophthalmology notes in chart records from care everywhere, glaucoma suspect, no diabetic retinopathy    Oluwatobi was seen today for medication refill.  Diagnoses and all orders for this visit:  Type 2 diabetes mellitus with other specified complication, without long-term current use of insulin -     HgB A1c  Skin lesion of face -     Ambulatory referral to Dermatology  Vaccine counseling   Follow up: 26mo

## 2022-04-28 NOTE — Patient Instructions (Signed)
Get your Tdap and Shingrix vaccines at your pharmacy and have them send [place] correspondence about the vaccines

## 2022-04-29 ENCOUNTER — Other Ambulatory Visit: Payer: Self-pay | Admitting: *Deleted

## 2022-04-29 ENCOUNTER — Encounter: Payer: Self-pay | Admitting: Physician Assistant

## 2022-04-29 ENCOUNTER — Ambulatory Visit: Payer: Medicare Other | Attending: Physician Assistant | Admitting: Physician Assistant

## 2022-04-29 VITALS — BP 146/72 | HR 78 | Ht 61.0 in | Wt 161.6 lb

## 2022-04-29 DIAGNOSIS — I10 Essential (primary) hypertension: Secondary | ICD-10-CM

## 2022-04-29 DIAGNOSIS — I471 Supraventricular tachycardia, unspecified: Secondary | ICD-10-CM

## 2022-04-29 DIAGNOSIS — I480 Paroxysmal atrial fibrillation: Secondary | ICD-10-CM

## 2022-04-29 LAB — CUP PACEART INCLINIC DEVICE CHECK
Date Time Interrogation Session: 20240425122410
Implantable Pulse Generator Implant Date: 20231103

## 2022-04-29 MED ORDER — MULTAQ 400 MG PO TABS: 400.0000 mg | ORAL_TABLET | Freq: Two times a day (BID) | ORAL | 0 refills | Status: DC

## 2022-04-29 MED ORDER — DRONEDARONE HCL 400 MG PO TABS: 400.0000 mg | ORAL_TABLET | Freq: Two times a day (BID) | ORAL | 0 refills | Status: DC

## 2022-04-29 MED ORDER — DRONEDARONE HCL 400 MG PO TABS: 400.0000 mg | ORAL_TABLET | Freq: Two times a day (BID) | ORAL | 2 refills | Status: DC

## 2022-04-29 NOTE — Patient Instructions (Addendum)
Medication Instructions:   Your physician recommends that you continue on your current medications as directed. Please refer to the Current Medication list given to you today.  *If you need a refill on your cardiac medications before your next appointment, please call your pharmacy*   Lab Work: NONE ORDERED  TODAY   If you have labs (blood work) drawn today and your tests are completely normal, you will receive your results only by: MyChart Message (if you have MyChart) OR A paper copy in the mail If you have any lab test that is abnormal or we need to change your treatment, we will call you to review the results.   Testing/Procedures: NONE ORDERED  TODAY     Follow-Up: At [facility], you and your health needs are our priority.  As part of our continuing mission to provide you with exceptional heart care, we have created designated Provider Care Teams.  These Care Teams include your primary Cardiologist (physician) and Advanced Practice Providers (APPs -  Physician Assistants and Nurse Practitioners) who all work together to provide you with the care you need, when you need it.  We recommend signing up for the patient portal called "MyChart".  Sign up information is provided on this After Visit Summary.  MyChart is used to connect with patients for Virtual Visits (Telemedicine).  Patients are able to view lab/test results, encounter notes, upcoming appointments, etc.  Non-urgent messages can be sent to your provider as well.   To learn more about what you can do with MyChart, go to [email or web address].    Your next appointment:   3 month(s)  Provider:   Renee Ursuy, PA-C    Other Instructions  

## 2022-04-30 ENCOUNTER — Telehealth: Payer: Self-pay | Admitting: Medical

## 2022-05-03 ENCOUNTER — Telehealth: Payer: Self-pay

## 2022-05-03 NOTE — Telephone Encounter (Signed)
Pt called to advise no one has reached out to her regarding the dermatology referral. I provided her the phone numbers for [facility] and [place]. She stated you mentioned possibly helping her get in sooner if you reached out.

## 2022-05-03 NOTE — Progress Notes (Signed)
[facility] Summary Report / Loop Recorder 

## 2022-05-05 ENCOUNTER — Other Ambulatory Visit: Payer: Self-pay | Admitting: Medical

## 2022-05-05 DIAGNOSIS — D492 Neoplasm of unspecified behavior of bone, soft tissue, and skin: Secondary | ICD-10-CM | POA: Diagnosis not present

## 2022-05-05 DIAGNOSIS — L821 Other seborrheic keratosis: Secondary | ICD-10-CM | POA: Diagnosis not present

## 2022-05-05 DIAGNOSIS — C44311 Basal cell carcinoma of skin of nose: Secondary | ICD-10-CM | POA: Diagnosis not present

## 2022-05-11 ENCOUNTER — Other Ambulatory Visit: Payer: Self-pay | Admitting: Physician Assistant

## 2022-05-11 ENCOUNTER — Other Ambulatory Visit: Payer: Self-pay | Admitting: Medical

## 2022-05-11 DIAGNOSIS — E7800 Pure hypercholesterolemia, unspecified: Secondary | ICD-10-CM

## 2022-05-12 DIAGNOSIS — M48062 Spinal stenosis, lumbar region with neurogenic claudication: Secondary | ICD-10-CM | POA: Diagnosis not present

## 2022-05-14 ENCOUNTER — Telehealth: Payer: Self-pay | Admitting: Medical

## 2022-05-14 MED ORDER — ACCU-CHEK GUIDE ME W/DEVICE KIT: PACK | 0 refills | Status: DC

## 2022-05-14 MED ORDER — ACCU-CHEK SOFTCLIX LANCETS MISC: 2 refills | Status: DC

## 2022-05-14 MED ORDER — ACCU-CHEK GUIDE VI STRP: ORAL_STRIP | 0 refills | Status: DC

## 2022-05-14 NOTE — Telephone Encounter (Signed)
done

## 2022-05-14 NOTE — Telephone Encounter (Signed)
[facility] sent refill request for lancet softclick Accu chek guide kit T/s accu check guide [facility]) - [place], [place] - [address] [place]

## 2022-05-19 DIAGNOSIS — C44311 Basal cell carcinoma of skin of nose: Secondary | ICD-10-CM | POA: Diagnosis not present

## 2022-05-27 DIAGNOSIS — Z7189 Other specified counseling: Secondary | ICD-10-CM | POA: Diagnosis not present

## 2022-05-27 DIAGNOSIS — Z08 Encounter for follow-up examination after completed treatment for malignant neoplasm: Secondary | ICD-10-CM | POA: Diagnosis not present

## 2022-05-27 DIAGNOSIS — L814 Other melanin hyperpigmentation: Secondary | ICD-10-CM | POA: Diagnosis not present

## 2022-05-27 DIAGNOSIS — D1801 Hemangioma of skin and subcutaneous tissue: Secondary | ICD-10-CM | POA: Diagnosis not present

## 2022-05-27 DIAGNOSIS — D2372 Other benign neoplasm of skin of left lower limb, including hip: Secondary | ICD-10-CM | POA: Diagnosis not present

## 2022-05-27 DIAGNOSIS — L821 Other seborrheic keratosis: Secondary | ICD-10-CM | POA: Diagnosis not present

## 2022-05-27 DIAGNOSIS — Z85828 Personal history of other malignant neoplasm of skin: Secondary | ICD-10-CM | POA: Diagnosis not present

## 2022-05-27 DIAGNOSIS — D225 Melanocytic nevi of trunk: Secondary | ICD-10-CM | POA: Diagnosis not present

## 2022-05-27 LAB — CUP PACEART REMOTE DEVICE CHECK
Date Time Interrogation Session: 20240522231016
Implantable Pulse Generator Implant Date: 20231103

## 2022-05-27 NOTE — Telephone Encounter (Signed)
error 

## 2022-06-01 ENCOUNTER — Ambulatory Visit (INDEPENDENT_AMBULATORY_CARE_PROVIDER_SITE_OTHER): Payer: Medicare Other

## 2022-06-01 DIAGNOSIS — I4819 Other persistent atrial fibrillation: Secondary | ICD-10-CM | POA: Diagnosis not present

## 2022-06-01 DIAGNOSIS — D6869 Other thrombophilia: Secondary | ICD-10-CM | POA: Diagnosis not present

## 2022-06-02 NOTE — Progress Notes (Signed)
[facility] Summary Report / Loop Recorder 

## 2022-06-03 ENCOUNTER — Other Ambulatory Visit: Payer: Self-pay

## 2022-06-03 DIAGNOSIS — Z5986 Financial insecurity: Secondary | ICD-10-CM

## 2022-06-04 ENCOUNTER — Telehealth: Payer: Self-pay

## 2022-06-04 ENCOUNTER — Ambulatory Visit: Payer: Medicare Other | Admitting: Cardiology

## 2022-06-04 NOTE — Telephone Encounter (Signed)
   Telephone encounter was:  Successful.  06/04/2022 Name: Sandra Ruiz MRN: [id number] DOB: 1945-03-15  Sandra Ruiz is a 77 y.o. year old female who is a primary care patient of Genia Del . The community resource team was consulted for assistance with Financial Difficulties related to Medical co pays  Care guide performed the following interventions: Patient provided with information about care guide support team and interviewed to confirm resource needs.  Follow Up Plan:  Care guide will follow up with patient by phone over the next day    [facility] Guide, [facility] [phone] [address] [place], [place], [place] [address] Phone: [phone] Email: [email or web address]

## 2022-06-10 ENCOUNTER — Telehealth: Payer: Self-pay

## 2022-06-10 NOTE — Telephone Encounter (Signed)
   Telephone encounter was:  Successful.  06/10/2022 Name: Sandra Ruiz MRN: [id number] DOB: 09-04-1945  Sandra Ruiz is a 77 y.o. year old female who is a primary care patient of Genia Del . The community resource team was consulted for assistance with Financial Difficulties related to Medical copays  Care guide performed the following interventions: Patient provided with information about care guide support team and interviewed to confirm resource needs. I have mailed [facility] Resources to the pt.   Follow Up Plan:  No further follow up planned at this time. The patient has been provided with needed resources.    Lenard Forth [facility] Guide, [facility] [phone] [address] [place], [place], [place] [address] Phone: [phone] Email: [email or web address]

## 2022-06-21 ENCOUNTER — Telehealth: Payer: Self-pay | Admitting: Medical

## 2022-06-21 NOTE — Telephone Encounter (Signed)
Pt called and states she needs a prescription for a motorized wheel chair through her insurance [employer].

## 2022-06-23 DIAGNOSIS — M5412 Radiculopathy, cervical region: Secondary | ICD-10-CM | POA: Diagnosis not present

## 2022-06-25 NOTE — Progress Notes (Signed)
[facility] Summary Report / Loop Recorder 

## 2022-06-28 ENCOUNTER — Ambulatory Visit (INDEPENDENT_AMBULATORY_CARE_PROVIDER_SITE_OTHER): Payer: Medicare Other

## 2022-06-28 DIAGNOSIS — I480 Paroxysmal atrial fibrillation: Secondary | ICD-10-CM | POA: Diagnosis not present

## 2022-06-29 LAB — CUP PACEART REMOTE DEVICE CHECK
Date Time Interrogation Session: 20240624231046
Implantable Pulse Generator Implant Date: 20231103

## 2022-06-29 NOTE — Telephone Encounter (Signed)
Did not mean to forward this to [place].

## 2022-07-05 ENCOUNTER — Ambulatory Visit: Payer: Medicare Other

## 2022-07-15 ENCOUNTER — Encounter: Payer: Medicare Other | Admitting: Physician Assistant

## 2022-07-19 NOTE — Progress Notes (Signed)
 Carelink Summary Report / Loop Recorder 

## 2022-07-21 ENCOUNTER — Other Ambulatory Visit: Payer: Self-pay | Admitting: Medical

## 2022-07-21 MED ORDER — ACCU-CHEK SOFTCLIX LANCETS MISC: 3 refills | Status: DC
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
## 2022-07-22 ENCOUNTER — Encounter: Payer: Self-pay | Admitting: Pulmonary Disease

## 2022-07-22 ENCOUNTER — Ambulatory Visit: Payer: Medicare Other | Attending: Physician Assistant | Admitting: Pulmonary Disease

## 2022-07-22 ENCOUNTER — Encounter: Payer: Self-pay | Admitting: *Deleted

## 2022-07-22 VITALS — BP 140/60 | HR 90 | Ht 61.0 in | Wt 152.4 lb

## 2022-07-22 DIAGNOSIS — I480 Paroxysmal atrial fibrillation: Secondary | ICD-10-CM | POA: Diagnosis not present

## 2022-07-22 DIAGNOSIS — D6869 Other thrombophilia: Secondary | ICD-10-CM

## 2022-07-22 DIAGNOSIS — Z79899 Other long term (current) drug therapy: Secondary | ICD-10-CM

## 2022-07-22 DIAGNOSIS — I4819 Other persistent atrial fibrillation: Secondary | ICD-10-CM

## 2022-07-22 DIAGNOSIS — R072 Precordial pain: Secondary | ICD-10-CM | POA: Diagnosis not present

## 2022-07-22 DIAGNOSIS — I471 Supraventricular tachycardia, unspecified: Secondary | ICD-10-CM | POA: Diagnosis not present

## 2022-07-22 DIAGNOSIS — I10 Essential (primary) hypertension: Secondary | ICD-10-CM | POA: Diagnosis not present

## 2022-07-22 NOTE — Patient Instructions (Signed)
Medication Instructions:  Your physician recommends that you continue on your current medications as directed. Please refer to the Current Medication list given to you today.  *If you need a refill on your cardiac medications before your next appointment, please call your pharmacy*  Lab Work: BMET, CBC-TODAY If you have labs (blood work) drawn today and your tests are completely normal, you will receive your results only by: MyChart Message (if you have MyChart) OR A paper copy in the mail If you have any lab test that is abnormal or we need to change your treatment, we will call you to review the results.   Testing/Procedures: Your physician has requested that you have a lexiscan myoview. For further information please visit [email or web address] Please follow instruction sheet, as given.   Follow-Up: At [facility], you and your health needs are our priority.  As part of our continuing mission to provide you with exceptional heart care, we have created designated Provider Care Teams.  These Care Teams include your primary Cardiologist (physician) and Advanced Practice Providers (APPs -  Physician Assistants and Nurse Practitioners) who all work together to provide you with the care you need, when you need it.   Your next appointment:   6 month(s)  Provider:   York Pellant, MD

## 2022-07-23 ENCOUNTER — Encounter (HOSPITAL_COMMUNITY): Payer: Self-pay

## 2022-07-23 ENCOUNTER — Other Ambulatory Visit: Payer: Self-pay

## 2022-07-23 ENCOUNTER — Ambulatory Visit: Payer: Medicare Other | Admitting: Internal Medicine

## 2022-07-23 ENCOUNTER — Telehealth: Payer: Self-pay | Admitting: Cardiology

## 2022-07-23 ENCOUNTER — Inpatient Hospital Stay (HOSPITAL_COMMUNITY)
Admission: EM | Admit: 2022-07-23 | Discharge: 2022-07-28 | DRG: 378 | Disposition: A | Discharge status: Home / Self Care | Payer: Medicare Other | Attending: Family Medicine | Admitting: Family Medicine

## 2022-07-23 DIAGNOSIS — Z8719 Personal history of other diseases of the digestive system: Secondary | ICD-10-CM

## 2022-07-23 DIAGNOSIS — E871 Hypo-osmolality and hyponatremia: Secondary | ICD-10-CM | POA: Diagnosis not present

## 2022-07-23 DIAGNOSIS — R197 Diarrhea, unspecified: Secondary | ICD-10-CM | POA: Diagnosis not present

## 2022-07-23 DIAGNOSIS — Z888 Allergy status to other drugs, medicaments and biological substances status: Secondary | ICD-10-CM

## 2022-07-23 DIAGNOSIS — Z531 Procedure and treatment not carried out because of patient's decision for reasons of belief and group pressure: Secondary | ICD-10-CM | POA: Diagnosis not present

## 2022-07-23 DIAGNOSIS — E876 Hypokalemia: Secondary | ICD-10-CM | POA: Diagnosis not present

## 2022-07-23 DIAGNOSIS — Z87891 Personal history of nicotine dependence: Secondary | ICD-10-CM

## 2022-07-23 DIAGNOSIS — E860 Dehydration: Secondary | ICD-10-CM | POA: Diagnosis present

## 2022-07-23 DIAGNOSIS — E785 Hyperlipidemia, unspecified: Secondary | ICD-10-CM | POA: Diagnosis not present

## 2022-07-23 DIAGNOSIS — I480 Paroxysmal atrial fibrillation: Secondary | ICD-10-CM | POA: Diagnosis not present

## 2022-07-23 DIAGNOSIS — Z86010 Personal history of colonic polyps: Secondary | ICD-10-CM

## 2022-07-23 DIAGNOSIS — Z79890 Hormone replacement therapy (postmenopausal): Secondary | ICD-10-CM

## 2022-07-23 DIAGNOSIS — K921 Melena: Secondary | ICD-10-CM | POA: Diagnosis not present

## 2022-07-23 DIAGNOSIS — Z8673 Personal history of transient ischemic attack (TIA), and cerebral infarction without residual deficits: Secondary | ICD-10-CM

## 2022-07-23 DIAGNOSIS — Z8711 Personal history of peptic ulcer disease: Secondary | ICD-10-CM | POA: Diagnosis not present

## 2022-07-23 DIAGNOSIS — G8929 Other chronic pain: Secondary | ICD-10-CM | POA: Diagnosis present

## 2022-07-23 DIAGNOSIS — Z79899 Other long term (current) drug therapy: Secondary | ICD-10-CM

## 2022-07-23 DIAGNOSIS — G4733 Obstructive sleep apnea (adult) (pediatric): Secondary | ICD-10-CM | POA: Diagnosis present

## 2022-07-23 DIAGNOSIS — K219 Gastro-esophageal reflux disease without esophagitis: Secondary | ICD-10-CM | POA: Diagnosis present

## 2022-07-23 DIAGNOSIS — E1143 Type 2 diabetes mellitus with diabetic autonomic (poly)neuropathy: Secondary | ICD-10-CM | POA: Diagnosis present

## 2022-07-23 DIAGNOSIS — Z7901 Long term (current) use of anticoagulants: Secondary | ICD-10-CM

## 2022-07-23 DIAGNOSIS — Z7985 Long-term (current) use of injectable non-insulin antidiabetic drugs: Secondary | ICD-10-CM

## 2022-07-23 DIAGNOSIS — Z8249 Family history of ischemic heart disease and other diseases of the circulatory system: Secondary | ICD-10-CM

## 2022-07-23 DIAGNOSIS — D649 Anemia, unspecified: Principal | ICD-10-CM | POA: Diagnosis present

## 2022-07-23 DIAGNOSIS — K30 Functional dyspepsia: Secondary | ICD-10-CM | POA: Diagnosis not present

## 2022-07-23 DIAGNOSIS — E039 Hypothyroidism, unspecified: Secondary | ICD-10-CM | POA: Diagnosis present

## 2022-07-23 DIAGNOSIS — K3184 Gastroparesis: Secondary | ICD-10-CM | POA: Diagnosis present

## 2022-07-23 DIAGNOSIS — K5900 Constipation, unspecified: Secondary | ICD-10-CM | POA: Diagnosis not present

## 2022-07-23 DIAGNOSIS — E538 Deficiency of other specified B group vitamins: Secondary | ICD-10-CM | POA: Diagnosis not present

## 2022-07-23 DIAGNOSIS — I10 Essential (primary) hypertension: Secondary | ICD-10-CM | POA: Diagnosis not present

## 2022-07-23 DIAGNOSIS — D509 Iron deficiency anemia, unspecified: Secondary | ICD-10-CM | POA: Diagnosis not present

## 2022-07-23 DIAGNOSIS — F32A Depression, unspecified: Secondary | ICD-10-CM | POA: Diagnosis present

## 2022-07-23 DIAGNOSIS — Z8349 Family history of other endocrine, nutritional and metabolic diseases: Secondary | ICD-10-CM

## 2022-07-23 DIAGNOSIS — E119 Type 2 diabetes mellitus without complications: Secondary | ICD-10-CM

## 2022-07-23 LAB — FERRITIN: Ferritin: 3 ng/mL — ABNORMAL LOW (ref 11–307)

## 2022-07-23 LAB — RETICULOCYTES
Immature Retic Fract: 32.5 % — ABNORMAL HIGH (ref 2.3–15.9)
RBC.: 3.38 MIL/uL — ABNORMAL LOW (ref 3.87–5.11)
Retic Count, Absolute: 62.9 10*3/uL (ref 19.0–186.0)
Retic Ct Pct: 1.9 % (ref 0.4–3.1)

## 2022-07-23 LAB — CBC
HCT: 22 % — ABNORMAL LOW (ref 36.0–46.0)
Hematocrit: 24.9 % — ABNORMAL LOW (ref 34.0–46.6)
Hemoglobin: 6.4 g/dL — CL (ref 11.1–15.9)
Hemoglobin: 5.8 g/dL — CL (ref 12.0–15.0)
MCH: 16.6 pg — ABNORMAL LOW (ref 26.6–33.0)
MCH: 17.5 pg — ABNORMAL LOW (ref 26.0–34.0)
MCHC: 25.7 g/dL — ABNORMAL LOW (ref 31.5–35.7)
MCHC: 26.4 g/dL — ABNORMAL LOW (ref 30.0–36.0)
MCV: 65 fL — ABNORMAL LOW (ref 79–97)
MCV: 66.3 fL — ABNORMAL LOW (ref 80.0–100.0)
Platelets: 491 10*3/uL — ABNORMAL HIGH (ref 150–450)
Platelets: 401 10*3/uL — ABNORMAL HIGH (ref 150–400)
RBC: 3.86 x10E6/uL (ref 3.77–5.28)
RBC: 3.32 MIL/uL — ABNORMAL LOW (ref 3.87–5.11)
RDW: 18.1 % — ABNORMAL HIGH (ref 11.7–15.4)
RDW: 18.9 % — ABNORMAL HIGH (ref 11.5–15.5)
WBC: 6.8 10*3/uL (ref 3.4–10.8)
WBC: 5.6 10*3/uL (ref 4.0–10.5)
nRBC: 0 % (ref 0.0–0.2)

## 2022-07-23 LAB — HEPATIC FUNCTION PANEL
ALT: 18 U/L (ref 0–44)
AST: 24 U/L (ref 15–41)
Albumin: 3.5 g/dL (ref 3.5–5.0)
Alkaline Phosphatase: 78 U/L (ref 38–126)
Bilirubin, Direct: <0.1 mg/dL (ref 0.0–0.2)
Total Bilirubin: 0.4 mg/dL (ref 0.3–1.2)
Total Protein: 6.5 g/dL (ref 6.5–8.1)

## 2022-07-23 LAB — CBG MONITORING, ED: Glucose-Capillary: 149 mg/dL — ABNORMAL HIGH (ref 70–99)

## 2022-07-23 LAB — BASIC METABOLIC PANEL
Anion gap: 7 (ref 5–15)
BUN/Creatinine Ratio: 16 (ref 12–28)
BUN: 13 mg/dL (ref 8–27)
BUN: 15 mg/dL (ref 8–23)
CO2: 23 mmol/L (ref 20–29)
CO2: 25 mmol/L (ref 22–32)
Calcium: 9.6 mg/dL (ref 8.7–10.3)
Calcium: 8.6 mg/dL — ABNORMAL LOW (ref 8.9–10.3)
Chloride: 100 mmol/L (ref 96–106)
Chloride: 101 mmol/L (ref 98–111)
Creatinine, Ser: 0.8 mg/dL (ref 0.57–1.00)
Creatinine, Ser: 0.76 mg/dL (ref 0.44–1.00)
GFR, Estimated: >60 mL/min (ref >60)
Glucose, Bld: 107 mg/dL — ABNORMAL HIGH (ref 70–99)
Glucose: 94 mg/dL (ref 70–99)
Potassium: 4.1 mmol/L (ref 3.5–5.2)
Potassium: 3.2 mmol/L — ABNORMAL LOW (ref 3.5–5.1)
Sodium: 137 mmol/L (ref 134–144)
Sodium: 133 mmol/L — ABNORMAL LOW (ref 135–145)
eGFR: 77 mL/min/{1.73_m2} (ref >59)

## 2022-07-23 LAB — FOLATE: Folate: 8.3 ng/mL (ref >5.9)

## 2022-07-23 LAB — IRON AND TIBC
Iron: 19 ug/dL — ABNORMAL LOW (ref 28–170)
Saturation Ratios: 5 % — ABNORMAL LOW (ref 10.4–31.8)
TIBC: 419 ug/dL (ref 250–450)
UIBC: 400 ug/dL

## 2022-07-23 LAB — URINALYSIS, ROUTINE W REFLEX MICROSCOPIC
Bilirubin Urine: NEGATIVE
Glucose, UA: NEGATIVE mg/dL
Hgb urine dipstick: NEGATIVE
Ketones, ur: NEGATIVE mg/dL
Leukocytes,Ua: NEGATIVE
Nitrite: NEGATIVE
Protein, ur: NEGATIVE mg/dL
Specific Gravity, Urine: 1.013 (ref 1.005–1.030)
pH: 6 (ref 5.0–8.0)

## 2022-07-23 LAB — POC OCCULT BLOOD, ED: Fecal Occult Bld: NEGATIVE

## 2022-07-23 LAB — VITAMIN B12: Vitamin B-12: 193 pg/mL (ref 180–914)

## 2022-07-23 MED ORDER — CYANOCOBALAMIN 1000 MCG/ML IJ SOLN
1000.0000 ug | Freq: Every day | INTRAMUSCULAR | Status: AC
  Administered 2022-07-23 – 2022-07-27 (×5): 1000 ug via INTRAMUSCULAR
  Filled 2022-07-23 (×6): qty 1

## 2022-07-23 MED ORDER — SODIUM CHLORIDE 0.9 % IV SOLN
5.0000 mg | Freq: Every day | INTRAVENOUS | Status: DC
  Administered 2022-07-23 – 2022-07-25 (×3): 5 mg via INTRAVENOUS
  Filled 2022-07-23 (×3): qty 1

## 2022-07-23 MED ORDER — PANTOPRAZOLE SODIUM 40 MG PO TBEC
40.0000 mg | DELAYED_RELEASE_TABLET | Freq: Two times a day (BID) | ORAL | Status: DC
  Administered 2022-07-23 – 2022-07-28 (×10): 40 mg via ORAL
  Filled 2022-07-23 (×9): qty 1

## 2022-07-23 MED ORDER — ACETAMINOPHEN 650 MG RE SUPP: 650.0000 mg | Freq: Four times a day (QID) | RECTAL | Status: DC | PRN

## 2022-07-23 MED ORDER — POTASSIUM CHLORIDE CRYS ER 20 MEQ PO TBCR
40.0000 meq | EXTENDED_RELEASE_TABLET | Freq: Once | ORAL | Status: AC
  Administered 2022-07-23: 40 meq via ORAL

## 2022-07-23 MED ORDER — BISACODYL 5 MG PO TBEC
5.0000 mg | DELAYED_RELEASE_TABLET | Freq: Every day | ORAL | Status: DC | PRN
  Administered 2022-07-23: 5 mg via ORAL
  Filled 2022-07-23: qty 1

## 2022-07-23 MED ORDER — ACETAMINOPHEN 325 MG PO TABS
650.0000 mg | ORAL_TABLET | Freq: Four times a day (QID) | ORAL | Status: DC | PRN
  Administered 2022-07-23 – 2022-07-28 (×5): 650 mg via ORAL
  Filled 2022-07-23 (×4): qty 2

## 2022-07-23 MED ORDER — ONDANSETRON HCL 4 MG PO TABS: 4.0000 mg | ORAL_TABLET | Freq: Four times a day (QID) | ORAL | Status: DC | PRN

## 2022-07-23 MED ORDER — ONDANSETRON HCL 4 MG/2ML IJ SOLN: 4.0000 mg | Freq: Four times a day (QID) | INTRAMUSCULAR | Status: DC | PRN

## 2022-07-23 MED ORDER — SODIUM CHLORIDE 0.9 % IV SOLN
250.0000 mg | Freq: Every day | INTRAVENOUS | Status: AC
  Administered 2022-07-23 – 2022-07-26 (×4): 250 mg via INTRAVENOUS
  Filled 2022-07-23 (×4): qty 20

## 2022-07-23 NOTE — Telephone Encounter (Signed)
Critical lab for this patient. Please advise.

## 2022-07-23 NOTE — ED Triage Notes (Signed)
Pt arrived via POV, c/o low hemoglobin around 6. Called by heartcare after follow up. States she has been dizzy and fatigued but thought it was her a fib. Pt is Sandra Ruiz witness and will not accept blood transfusion.

## 2022-07-23 NOTE — H&P (Signed)
History and Physical    Sandra Ruiz:[id number] DOB: [birth year] DOA: 07/23/2022  I have briefly reviewed the patient's prior medical records in [facility] Link  PCP: Aleen Campi Kermit Balo, PA-C  Patient coming from: home  Chief Complaint: weakness  HPI: Sandra Ruiz is a 77 y.o. female with medical history significant of PAF on Eliquis, DM2, hypothyroidism, prior TIA, HLD who comes to the hospital with generalized weakness. This has been going on for 2-3 months, but progressively getting worse in the past few days. She has also become more dyspneic with minimal activities. Denies chest pain, HA, fevers or chills. She occasionally sees dark stools for 1 day at a time, happening not more than once monthly. She has had an unremarkable EGD and colonoscopy about a year ago for iron deficiency anemia. 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    ED Course: in the ED she is afebrile, normotensive. HR in the 70-80s on telemetry, showing sinus rhythm. Labs pertinent for a Hb of 5.8, MCV 66.3, WBC and Plt unremarkable. FOBT negative, stool brown. We are asked to admit for symptomatic anemia  Review of Systems: All systems reviewed, and apart from HPI, all negative  Past Medical History:  Diagnosis Date   Chronic pain    Depression    Diabetes mellitus without complication (HCC)    GERD (gastroesophageal reflux disease)    History of TIA (transient ischemic attack)    Hyperlipidemia    Hyperopia of both eyes with astigmatism and presbyopia 12/21/2019   Hypertension    Lyme disease    Paroxysmal atrial fibrillation (HCC)    Sleep apnea    Thyroid disease     Past Surgical History:  Procedure Laterality Date   ATRIAL FIBRILLATION ABLATION     x2 in [place]   CARDIAC CATHETERIZATION     CARDIOVERSION N/A 03/05/2021   Procedure: CARDIOVERSION;  Surgeon: Meriam Sprague, MD;  Location: [facility] ENDOSCOPY;  Service: Cardiovascular;  Laterality: N/A;   CERVICAL SPINE SURGERY      implantable loop recorder placement  11/07/2017   MDT LINQ1 implanted in [place] for afib management by Dr Thedore Mins   LOOP RECORDER INSERTION N/A 11/06/2021   Procedure: LOOP RECORDER INSERTION;  Surgeon: Maurice Small, MD;  Location: [facility] INVASIVE CV LAB;  Service: Cardiovascular;  Laterality: N/A;   LOOP RECORDER REMOVAL N/A 11/06/2021   Procedure: LOOP RECORDER REMOVAL;  Surgeon: Maurice Small, MD;  Location: [facility] INVASIVE CV LAB;  Service: Cardiovascular;  Laterality: N/A;   LUMBAR SPINE SURGERY     TONSILLECTOMY AND ADENOIDECTOMY       reports that she has quit smoking. She has never used smokeless tobacco. She reports current alcohol use of about 2.0 standard drinks of alcohol per week. She reports that she does not use drugs.  Allergies  Allergen Reactions   Atorvastatin Other (See Comments)    Extreme myalgias   Baclofen Swelling   Crestor [Rosuvastatin]     Extreme myalgias   Metformin And Related Nausea Only   Verapamil Swelling   Silicone Itching and Rash   Wellbutrin [Bupropion] Anxiety    Hallucinations     Family History  Problem Relation Age of Onset   CAD Mother    Thyroid disease Mother    CAD Father    Thyroid disease Daughter    Hypothyroidism Daughter     Prior to Admission medications   Medication Sig Start Date End Date Taking? Authorizing Provider  ACCU-CHEK GUIDE test strip TEST 1 - 2 TIMES DAILY 05/14/22   Tysinger, Kermit Balo, PA-C  Accu-Chek Softclix Lancets lancets Use as instructed 07/21/22   Tysinger, Kermit Balo, PA-C  ALPRAZolam Prudy Feeler) 0.25 MG tablet Take 1 tablet (0.25 mg total) by mouth 2 (two) times daily as needed for anxiety. 06/11/21   Tysinger, Kermit Balo, PA-C  apixaban (ELIQUIS) 5 MG TABS tablet Take 1 tablet (5 mg total) by mouth 2 (two) times daily. 10/01/21   Tysinger, Kermit Balo, PA-C  Blood Glucose Monitoring Suppl (ACCU-CHEK GUIDE ME) w/Device KIT Test 1-2 times daily 05/14/22   Tysinger, Kermit Balo, PA-C  diltiazem (CARDIZEM) 30 MG tablet TAKE 1  TABLET BY MOUTH EVERY 4 HOURS AS NEEDED FOR AFIB HEART RATE >100 AS LONG AS TOP BP >100 04/02/22   Fenton, Clint R, PA  diltiazem (DILT-XR) 240 MG 24 hr capsule TAKE 1 CAPSULE BY MOUTH DAILY 03/17/22   Sheilah Pigeon, PA-C  dronedarone (MULTAQ) 400 MG tablet Take 1 tablet (400 mg total) by mouth 2 (two) times daily with a meal. 04/29/22   Sheilah Pigeon, PA-C  DULoxetine (CYMBALTA) 60 MG capsule TAKE 1 CAPSULE BY MOUTH TWICE  DAILY 04/15/22   Tysinger, Kermit Balo, PA-C  ezetimibe (ZETIA) 10 MG tablet TAKE 1 TABLET BY MOUTH DAILY 11/17/21   Tysinger, Kermit Balo, PA-C  furosemide (LASIX) 20 MG tablet TAKE 0.5 TABLET BY MOUTH DAILY AS NEEDED SWELLING OR WEIGHT GAIN 09/14/21   Tysinger, Kermit Balo, PA-C  gabapentin (NEURONTIN) 800 MG tablet Take 1 tablet (800 mg total) by mouth 3 (three) times daily. 05/06/22   Tysinger, Kermit Balo, PA-C  levothyroxine (SYNTHROID) 75 MCG tablet TAKE 1 TABLET BY MOUTH DAILY  BEFORE BREAKFAST 05/11/22   Tysinger, Kermit Balo, PA-C  MOUNJARO 10 MG/0.5ML Pen Inject into the skin once a week. 06/17/22   [provider]  nitroGLYCERIN (NITROSTAT) 0.4 MG SL tablet DISSOLVE 1 TABLET UNDER THE  TONGUE EVERY 5 MINUTES AS NEEDED FOR CHEST PAIN. MAX OF 3 TABLETS IN 15 MINUTES. CALL 911 IF PAIN  PERSISTS. 05/11/22   Lewayne Bunting, MD  omeprazole (PRILOSEC) 20 MG capsule TAKE 1 CAPSULE BY MOUTH DAILY 05/11/22   Tysinger, Kermit Balo, PA-C  oxybutynin (DITROPAN-XL) 10 MG 24 hr tablet TAKE 1 TABLET BY MOUTH TWICE  DAILY 11/17/21   Tysinger, Kermit Balo, PA-C  pravastatin (PRAVACHOL) 40 MG tablet TAKE 1 TABLET BY MOUTH IN THE  EVENING 05/11/22   Tysinger, Kermit Balo, PA-C  Tetrahydrozoline HCl ([employer] EYE DROPS OP) Place 1 drop into both eyes in the morning, at noon, in the evening, and at bedtime. Dry eyes    [provider]    Physical Exam: Vitals:   07/23/22 0908 07/23/22 0917  BP: (!) 125/41   Pulse: (!) 101   Resp:  18  Temp:  98.2 F (36.8 C)  TempSrc:  Oral  SpO2: 100%        Constitutional: NAD, calm, comfortable Eyes: PERRL, lids and conjunctivae normal ENMT: Mucous membranes are moist. Posterior pharynx clear of any exudate or lesions.Normal dentition.  Neck: normal, supple Respiratory: clear to auscultation bilaterally, no wheezing, no crackles. Normal respiratory effort. No accessory muscle use.  Cardiovascular: Regular rate and rhythm, no murmurs / rubs / gallops. No extremity edema. 2+ pedal pulses.  Abdomen: no tenderness, no masses palpated. Bowel sounds positive.  Musculoskeletal: no clubbing / cyanosis. Normal muscle tone.  Skin: no rashes, lesions, ulcers. No induration Neurologic: CN 2-12 grossly  intact. Strength 5/5 in all 4.   Labs on Admission: I have personally reviewed following labs and imaging studies  CBC: Recent Labs  Lab 07/22/22 0905 07/23/22 1018  WBC 6.8 5.6  HGB 6.4* 5.8*  HCT 24.9* 22.0*  MCV 65* 66.3*  PLT 491* 401*   Basic Metabolic Panel: Recent Labs  Lab 07/22/22 0905 07/23/22 1018  NA 137 133*  K 4.1 3.2*  CL 100 101  CO2 23 25  GLUCOSE 94 107*  BUN 13 15  CREATININE 0.80 0.76  CALCIUM 9.6 8.6*   Liver Function Tests: Recent Labs  Lab 07/23/22 1018  AST 24  ALT 18  ALKPHOS 78  BILITOT 0.4  PROT 6.5  ALBUMIN 3.5   Coagulation Profile: No results for input(s): "INR", "PROTIME" in the last 168 hours. BNP (last 3 results) No results for input(s): "PROBNP" in the last 8760 hours. CBG: Recent Labs  Lab 07/23/22 0910  GLUCAP 149*   Thyroid Function Tests: No results for input(s): "TSH", "T4TOTAL", "FREET4", "T3FREE", "THYROIDAB" in the last 72 hours. Urine analysis: No results found for: "COLORURINE", "APPEARANCEUR", "LABSPEC", "PHURINE", "GLUCOSEU", "HGBUR", "BILIRUBINUR", "KETONESUR", "PROTEINUR", "UROBILINOGEN", "NITRITE", "LEUKOCYTESUR"   Radiological Exams on Admission: No results found.  EKG: Independently reviewed. Sinus rhythm   Assessment/Plan Principal problem Symptomatic  anemia - high likelihood of iron deficiency given intermittent melena, low MCV and on no iron supplements.  -discussed with Dr Lorenso Quarry with GI, endoscopy would have a very low yield given no melena, negative FOBT -will not transfuse as per whishes, risks regarding Hb < 7 discussed, including MI, etc -administer IV iron, folic acid, B12 while awaiting anemia panel -closely monitor  Active problems PAF - in sinus now, resume home meds once reconciled, hold Eliquis  HLD - resume home meds  Hypokalemia - replace K  Hyponatremia - mild, possbly dehydrated. Avoid IVF as it may dilute the Hb more, encouraged po intake  Hypothyroidism - resume synthroid  History of PUD - per patient, placed on BID PPI   DVT prophylaxis: SCDs  Code Status: Full code  Family Communication: husband at bedside  Disposition Plan: home when ready  Bed Type: Telemetry  Consults called: GI  Obs/Inp: Observation  Pamella Pert, MD, PhD [facility]  Contact via [email or web address]  07/23/2022, 12:11 PM

## 2022-07-23 NOTE — ED Provider Notes (Addendum)
[place] EMERGENCY DEPARTMENT AT [facility] Provider Note   CSN: [id number] Arrival date & time: 07/23/22  0350     History  Chief Complaint  Patient presents with   Abnormal Lab    Sandra Ruiz is a 77 y.o. female.  Pt has been with sob, decreased energy the last few weeks, intermittent dark stools. On eliquis for atrial fibrillation. Denies abdominal pain or cp currently. She is a Air traffic controller witness. Denies headache, vision changes. No GI bleed hx. Hx of hemorrhoids. No pain with urination, no blacks stools over night    Abnormal Lab Time since result:  [place] Patient referred by:  Specialist Resulting agency:  Internal Result type: hematology   Hematology:    Hemoglobin:  Low   Other abnormal hematology result:  Hb of 6.4      Home Medications Prior to Admission medications   Medication Sig Start Date End Date Taking? Authorizing Provider  ACCU-CHEK GUIDE test strip TEST 1 - 2 TIMES DAILY 05/14/22   Tysinger, Kermit Balo, PA-C  Accu-Chek Softclix Lancets lancets Use as instructed 07/21/22   Tysinger, Kermit Balo, PA-C  ALPRAZolam Prudy Feeler) 0.25 MG tablet Take 1 tablet (0.25 mg total) by mouth 2 (two) times daily as needed for anxiety. 06/11/21   Tysinger, Kermit Balo, PA-C  apixaban (ELIQUIS) 5 MG TABS tablet Take 1 tablet (5 mg total) by mouth 2 (two) times daily. 10/01/21   Tysinger, Kermit Balo, PA-C  Blood Glucose Monitoring Suppl (ACCU-CHEK GUIDE ME) w/Device KIT Test 1-2 times daily 05/14/22   Tysinger, Kermit Balo, PA-C  diltiazem (CARDIZEM) 30 MG tablet TAKE 1 TABLET BY MOUTH EVERY 4 HOURS AS NEEDED FOR AFIB HEART RATE >100 AS LONG AS TOP BP >100 04/02/22   Fenton, Clint R, PA  diltiazem (DILT-XR) 240 MG 24 hr capsule TAKE 1 CAPSULE BY MOUTH DAILY 03/17/22   Sheilah Pigeon, PA-C  dronedarone (MULTAQ) 400 MG tablet Take 1 tablet (400 mg total) by mouth 2 (two) times daily with a meal. 04/29/22   Sheilah Pigeon, PA-C  DULoxetine (CYMBALTA) 60 MG capsule TAKE 1 CAPSULE BY  MOUTH TWICE  DAILY 04/15/22   Tysinger, Kermit Balo, PA-C  ezetimibe (ZETIA) 10 MG tablet TAKE 1 TABLET BY MOUTH DAILY 11/17/21   Tysinger, Kermit Balo, PA-C  furosemide (LASIX) 20 MG tablet TAKE 0.5 TABLET BY MOUTH DAILY AS NEEDED SWELLING OR WEIGHT GAIN 09/14/21   Tysinger, Kermit Balo, PA-C  gabapentin (NEURONTIN) 800 MG tablet Take 1 tablet (800 mg total) by mouth 3 (three) times daily. 05/06/22   Tysinger, Kermit Balo, PA-C  levothyroxine (SYNTHROID) 75 MCG tablet TAKE 1 TABLET BY MOUTH DAILY  BEFORE BREAKFAST 05/11/22   Tysinger, Kermit Balo, PA-C  MOUNJARO 10 MG/0.5ML Pen Inject into the skin once a week. 06/17/22   [provider]  nitroGLYCERIN (NITROSTAT) 0.4 MG SL tablet DISSOLVE 1 TABLET UNDER THE  TONGUE EVERY 5 MINUTES AS NEEDED FOR CHEST PAIN. MAX OF 3 TABLETS IN 15 MINUTES. CALL 911 IF PAIN  PERSISTS. 05/11/22   Lewayne Bunting, MD  omeprazole (PRILOSEC) 20 MG capsule TAKE 1 CAPSULE BY MOUTH DAILY 05/11/22   Tysinger, Kermit Balo, PA-C  oxybutynin (DITROPAN-XL) 10 MG 24 hr tablet TAKE 1 TABLET BY MOUTH TWICE  DAILY 11/17/21   Tysinger, Kermit Balo, PA-C  pravastatin (PRAVACHOL) 40 MG tablet TAKE 1 TABLET BY MOUTH IN THE  EVENING 05/11/22   Tysinger, Kermit Balo, PA-C  Tetrahydrozoline HCl ([employer] EYE DROPS OP) Place 1  drop into both eyes in the morning, at noon, in the evening, and at bedtime. Dry eyes    [provider]      Allergies    Atorvastatin, Baclofen, Crestor [rosuvastatin], Metformin and related, Verapamil, Silicone, and Wellbutrin [bupropion]    Review of Systems   Review of Systems  Physical Exam Updated Vital Signs BP (!) 125/41   Pulse (!) 101   Temp 98.2 F (36.8 C) (Oral)   Resp 18   SpO2 100%  Physical Exam Vitals and nursing note reviewed.  Constitutional:      General: She is not in acute distress.    Appearance: She is well-developed. She is not ill-appearing.  HENT:     Head: Normocephalic and atraumatic.  Eyes:     Extraocular Movements: Extraocular movements intact.      Pupils: Pupils are equal, round, and reactive to light.     Comments: Pale conjuctiva   Cardiovascular:     Rate and Rhythm: Regular rhythm. Tachycardia present.     Pulses: Normal pulses.     Heart sounds: Normal heart sounds. No murmur heard. Pulmonary:     Effort: Pulmonary effort is normal. No respiratory distress.     Breath sounds: Normal breath sounds.  Abdominal:     Palpations: Abdomen is soft.     Tenderness: There is no abdominal tenderness.  Genitourinary:    Rectum: Guaiac result negative.     Comments: Stool brown and mucousy Musculoskeletal:        General: No swelling.     Cervical back: Normal range of motion and neck supple.  Skin:    General: Skin is warm and dry.     Capillary Refill: Capillary refill takes less than 2 seconds.  Neurological:     Mental Status: She is alert.  Psychiatric:        Mood and Affect: Mood normal.     ED Results / Procedures / Treatments   Labs (all labs ordered are listed, but only abnormal results are displayed) Labs Reviewed  BASIC METABOLIC PANEL - Abnormal; Notable for the following components:      Result Value   Sodium 133 (*)    Potassium 3.2 (*)    Glucose, Bld 107 (*)    Calcium 8.6 (*)    All other components within normal limits  CBC - Abnormal; Notable for the following components:   RBC 3.32 (*)    Hemoglobin 5.8 (*)    HCT 22.0 (*)    MCV 66.3 (*)    MCH 17.5 (*)    MCHC 26.4 (*)    RDW 18.9 (*)    Platelets 401 (*)    All other components within normal limits  RETICULOCYTES - Abnormal; Notable for the following components:   RBC. 3.38 (*)    Immature Retic Fract 32.5 (*)    All other components within normal limits  CBG MONITORING, ED - Abnormal; Notable for the following components:   Glucose-Capillary 149 (*)    All other components within normal limits  HEPATIC FUNCTION PANEL  URINALYSIS, ROUTINE W REFLEX MICROSCOPIC  VITAMIN B12  FOLATE  IRON AND TIBC  FERRITIN  CBG MONITORING, ED   POC OCCULT BLOOD, ED    EKG None  Radiology No results found.  Procedures .Critical Care  Performed by: Virgina Norfolk, DO Authorized by: Virgina Norfolk, DO   Critical care provider statement:    Critical care time (minutes):  35   Critical care was  necessary to treat or prevent imminent or life-threatening deterioration of the following conditions:  Circulatory failure   Critical care was time spent personally by me on the following activities:  Blood draw for specimens, development of treatment plan with patient or surrogate, discussions with consultants, evaluation of patient's response to treatment, discussions with primary provider, examination of patient, obtaining history from patient or surrogate, ordering and performing treatments and interventions, ordering and review of laboratory studies, ordering and review of radiographic studies, pulse oximetry, re-evaluation of patient's condition and review of old charts   I assumed direction of critical care for this patient from another provider in my specialty: no     Care discussed with: admitting provider       Medications Ordered in ED Medications - No data to display  ED Course/ Medical Decision Making/ A&P                             Medical Decision Making Amount and/or Complexity of Data Reviewed Labs: ordered.  Risk Decision regarding hospitalization.   BENIGNA DELISI is here with abnormal lab. Vitals unrermabkle. Pt had blood drawn yesterday to evaluate weakness and fatifue and sob she's been having last few weeks, inttermittent dark stools. Hb was 6.4 yestefay per my review of chart. Pt has afib on eliquis. Hx of DM, TIA. No gi bleed hx, no obvious GI bleed risk factors. Pt with brown stool on exam, EGD/Colonoscopy done recently unremarkable. Will recheck labs and anemia panel. She is a Air traffic controller witness. She has been on iron in past. She would not take transfusion. Ddx possible for a GI bleed, but could be acute  on chronic anemia/iron def anemia. Will recheck labs, hemoccult neg and stool brown but on blood thinners.  Per my review and interpretation of labs, patietn with hb with 5.8. MCV of 66.3. BMP unremarkable. No white count. Overall could be acute on chronic anemia from iron def but still could be a GI bleed process. I have consult [facility], Dr. Lorenso Quarry who recommends medical workup as hemmocult isnegative and stool brown. will admit to medicine for further care BUT GI available if needed for formal consult if medicine wants.  This chart was dictated using voice recognition software.  Despite best efforts to proofread,  errors can occur which can change the documentation meaning.         Final Clinical Impression(s) / ED Diagnoses Final diagnoses:  Symptomatic anemia    Rx / DC Orders ED Discharge Orders     None         Virgina Norfolk, DO 07/23/22 1138    Laval Cafaro, DO 07/23/22 1139

## 2022-07-23 NOTE — ED Notes (Signed)
ED TO INPATIENT HANDOFF REPORT  Name/Age/Gender Sandra Ruiz 77 y.o. female  Code Status    Code Status Orders  (From admission, onward)           Start     Ordered   07/23/22 1207  Full code  Continuous       Question:  By:  Answer:  Consent: discussion documented in EHR   07/23/22 1207           Code Status History     Date Active Date Inactive Code Status Order ID Comments User Context   09/06/2020 1839 09/07/2020 2045 Full Code [id number]  Orland Mustard, MD Inpatient       Home/SNF/Other Home  Chief Complaint Symptomatic anemia [D64.9]  Level of Care/Admitting Diagnosis ED Disposition     ED Disposition  Admit   Condition  --   Comment  Hospital Area: [facility] [[id number]]  Level of Care: Telemetry [5]  Admit to tele based on following criteria: Complex arrhythmia (Bradycardia/Tachycardia)  May place patient in observation at [facility] or [facility] if equivalent level of care is available:: Yes  Covid Evaluation: Asymptomatic - no recent exposure (last 10 days) testing not required  Diagnosis: Symptomatic anemia [1027253]  Admitting Physician: Leatha Gilding [phone]  Attending Physician: Leatha Gilding [phone]          Medical History Past Medical History:  Diagnosis Date   Chronic pain    Depression    Diabetes mellitus without complication (HCC)    GERD (gastroesophageal reflux disease)    History of TIA (transient ischemic attack)    Hyperlipidemia    Hyperopia of both eyes with astigmatism and presbyopia 12/21/2019   Hypertension    Lyme disease    Paroxysmal atrial fibrillation (HCC)    Sleep apnea    Thyroid disease     Allergies Allergies  Allergen Reactions   Atorvastatin Other (See Comments)    Extreme myalgias   Baclofen Swelling   Crestor [Rosuvastatin]     Extreme myalgias   Metformin And Related Nausea Only   Verapamil Swelling   Silicone Itching and Rash   Wellbutrin [Bupropion]  Anxiety    Hallucinations     IV Location/Drains/Wounds Patient Lines/Drains/Airways Status     Active Line/Drains/Airways     None            Labs/Imaging Results for orders placed or performed during the hospital encounter of 07/23/22 (from the past 48 hour(s))  CBG monitoring, ED     Status: Abnormal   Collection Time: 07/23/22  9:10 AM  Result Value Ref Range   Glucose-Capillary 149 (H) 70 - 99 mg/dL    Comment: Glucose reference range applies only to samples taken after fasting for at least 8 hours.  POC occult blood, ED     Status: None   Collection Time: 07/23/22  9:57 AM  Result Value Ref Range   Fecal Occult Bld NEGATIVE NEGATIVE  Basic metabolic panel     Status: Abnormal   Collection Time: 07/23/22 10:18 AM  Result Value Ref Range   Sodium 133 (L) 135 - 145 mmol/L   Potassium 3.2 (L) 3.5 - 5.1 mmol/L   Chloride 101 98 - 111 mmol/L   CO2 25 22 - 32 mmol/L   Glucose, Bld 107 (H) 70 - 99 mg/dL    Comment: Glucose reference range applies only to samples taken after fasting for at least 8 hours.   BUN 15  8 - 23 mg/dL   Creatinine, Ser 1.61 0.44 - 1.00 mg/dL   Calcium 8.6 (L) 8.9 - 10.3 mg/dL   GFR, Estimated >09 >60 mL/min    Comment: (NOTE) Calculated using the CKD-EPI Creatinine Equation (2021)    Anion gap 7 5 - 15    Comment: Performed at [facility], [address], [place], [place] [address]  CBC     Status: Abnormal   Collection Time: 07/23/22 10:18 AM  Result Value Ref Range   WBC 5.6 4.0 - 10.5 K/uL   RBC 3.32 (L) 3.87 - 5.11 MIL/uL   Hemoglobin 5.8 (LL) 12.0 - 15.0 g/dL    Comment: REPEATED TO VERIFY CRITICAL RESULT CALLED TO, READ BACK BY AND VERIFIED WITH: KANT,S. RN @1120  ON 7.19.2024 BY NMCCOY    HCT 22.0 (L) 36.0 - 46.0 %   MCV 66.3 (L) 80.0 - 100.0 fL   MCH 17.5 (L) 26.0 - 34.0 pg   MCHC 26.4 (L) 30.0 - 36.0 g/dL   RDW 81.1 (H) 91.4 - 78.2 %   Platelets 401 (H) 150 - 400 K/uL   nRBC 0.0 0.0 - 0.2 %    Comment:  Performed at [facility], [address] [place], [place] [address]  Hepatic function panel     Status: None   Collection Time: 07/23/22 10:18 AM  Result Value Ref Range   Total Protein 6.5 6.5 - 8.1 g/dL   Albumin 3.5 3.5 - 5.0 g/dL   AST 24 15 - 41 U/L   ALT 18 0 - 44 U/L   Alkaline Phosphatase 78 38 - 126 U/L   Total Bilirubin 0.4 0.3 - 1.2 mg/dL   Bilirubin, Direct <3.0 0.0 - 0.2 mg/dL   Indirect Bilirubin NOT CALCULATED 0.3 - 0.9 mg/dL    Comment: Performed at [facility], [address], [place], [place] [address]  Reticulocytes     Status: Abnormal   Collection Time: 07/23/22 10:30 AM  Result Value Ref Range   Retic Ct Pct 1.9 0.4 - 3.1 %   RBC. 3.38 (L) 3.87 - 5.11 MIL/uL   Retic Count, Absolute 62.9 19.0 - 186.0 K/uL   Immature Retic Fract 32.5 (H) 2.3 - 15.9 %    Comment: Performed at [facility], [address], [place], [place] [address]   No results found.  Pending Labs Unresulted Labs (From admission, onward)     Start     Ordered   07/23/22 0948  Vitamin B12  (Anemia Panel (PNL))  Once,   URGENT        07/23/22 0947   07/23/22 0948  Folate  (Anemia Panel (PNL))  Once,   URGENT        07/23/22 0947   07/23/22 0948  Iron and TIBC  (Anemia Panel (PNL))  Once,   URGENT        07/23/22 0947   07/23/22 0948  Ferritin  (Anemia Panel (PNL))  Once,   URGENT        07/23/22 0947   07/23/22 0916  Urinalysis, Routine w reflex microscopic -Urine, Clean Catch  Once,   URGENT       Question:  Specimen Source  Answer:  Urine, Clean Catch   07/23/22 0915            Vitals/Pain Today's Vitals   07/23/22 0908 07/23/22 0917  BP: (!) 125/41   Pulse: (!) 101   Resp:  18  Temp:  98.2 F (36.8 C)  TempSrc:  Oral  SpO2: 100%     Isolation Precautions No active isolations  Medications Medications  ferric gluconate (FERRLECIT) 250 mg in sodium chloride 0.9 % 250 mL IVPB (has no administration in time  range)  cyanocobalamin (VITAMIN B12) injection 1,000 mcg (has no administration in time range)  folic acid 5 mg in sodium chloride 0.9 % 50 mL IVPB (has no administration in time range)  potassium chloride SA (KLOR-CON M) CR tablet 40 mEq (has no administration in time range)  pantoprazole (PROTONIX) EC tablet 40 mg (has no administration in time range)    Mobility walks

## 2022-07-23 NOTE — Telephone Encounter (Signed)
[facility] called to report critical lab for hemoglobin.   Provider have spoken with patient and sent her to the hospital. Please see result note.

## 2022-07-24 ENCOUNTER — Other Ambulatory Visit: Payer: Self-pay | Admitting: Medical

## 2022-07-24 ENCOUNTER — Observation Stay (HOSPITAL_COMMUNITY): Payer: Medicare Other

## 2022-07-24 DIAGNOSIS — I10 Essential (primary) hypertension: Secondary | ICD-10-CM | POA: Diagnosis not present

## 2022-07-24 DIAGNOSIS — Z8249 Family history of ischemic heart disease and other diseases of the circulatory system: Secondary | ICD-10-CM | POA: Diagnosis not present

## 2022-07-24 DIAGNOSIS — D509 Iron deficiency anemia, unspecified: Secondary | ICD-10-CM | POA: Diagnosis not present

## 2022-07-24 DIAGNOSIS — Z87891 Personal history of nicotine dependence: Secondary | ICD-10-CM | POA: Diagnosis not present

## 2022-07-24 DIAGNOSIS — D649 Anemia, unspecified: Secondary | ICD-10-CM | POA: Diagnosis not present

## 2022-07-24 DIAGNOSIS — Z531 Procedure and treatment not carried out because of patient's decision for reasons of belief and group pressure: Secondary | ICD-10-CM | POA: Diagnosis present

## 2022-07-24 DIAGNOSIS — K3184 Gastroparesis: Secondary | ICD-10-CM | POA: Diagnosis not present

## 2022-07-24 DIAGNOSIS — Z79890 Hormone replacement therapy (postmenopausal): Secondary | ICD-10-CM | POA: Diagnosis not present

## 2022-07-24 DIAGNOSIS — Z8711 Personal history of peptic ulcer disease: Secondary | ICD-10-CM | POA: Diagnosis not present

## 2022-07-24 DIAGNOSIS — Z8673 Personal history of transient ischemic attack (TIA), and cerebral infarction without residual deficits: Secondary | ICD-10-CM | POA: Diagnosis not present

## 2022-07-24 DIAGNOSIS — E876 Hypokalemia: Secondary | ICD-10-CM | POA: Diagnosis present

## 2022-07-24 DIAGNOSIS — E039 Hypothyroidism, unspecified: Secondary | ICD-10-CM | POA: Diagnosis not present

## 2022-07-24 DIAGNOSIS — I480 Paroxysmal atrial fibrillation: Secondary | ICD-10-CM | POA: Diagnosis not present

## 2022-07-24 DIAGNOSIS — T81509A Unspecified complication of foreign body accidentally left in body following unspecified procedure, initial encounter: Secondary | ICD-10-CM | POA: Diagnosis not present

## 2022-07-24 DIAGNOSIS — Z7985 Long-term (current) use of injectable non-insulin antidiabetic drugs: Secondary | ICD-10-CM | POA: Diagnosis not present

## 2022-07-24 DIAGNOSIS — Z7901 Long term (current) use of anticoagulants: Secondary | ICD-10-CM | POA: Diagnosis not present

## 2022-07-24 DIAGNOSIS — Z79899 Other long term (current) drug therapy: Secondary | ICD-10-CM | POA: Diagnosis not present

## 2022-07-24 DIAGNOSIS — E860 Dehydration: Secondary | ICD-10-CM | POA: Diagnosis not present

## 2022-07-24 DIAGNOSIS — R109 Unspecified abdominal pain: Secondary | ICD-10-CM | POA: Diagnosis not present

## 2022-07-24 DIAGNOSIS — K5730 Diverticulosis of large intestine without perforation or abscess without bleeding: Secondary | ICD-10-CM | POA: Diagnosis not present

## 2022-07-24 DIAGNOSIS — G4733 Obstructive sleep apnea (adult) (pediatric): Secondary | ICD-10-CM | POA: Diagnosis present

## 2022-07-24 DIAGNOSIS — E1143 Type 2 diabetes mellitus with diabetic autonomic (poly)neuropathy: Secondary | ICD-10-CM | POA: Diagnosis not present

## 2022-07-24 DIAGNOSIS — N3289 Other specified disorders of bladder: Secondary | ICD-10-CM | POA: Diagnosis not present

## 2022-07-24 DIAGNOSIS — E785 Hyperlipidemia, unspecified: Secondary | ICD-10-CM | POA: Diagnosis not present

## 2022-07-24 DIAGNOSIS — R110 Nausea: Secondary | ICD-10-CM | POA: Diagnosis not present

## 2022-07-24 DIAGNOSIS — E538 Deficiency of other specified B group vitamins: Secondary | ICD-10-CM | POA: Diagnosis not present

## 2022-07-24 DIAGNOSIS — K5900 Constipation, unspecified: Secondary | ICD-10-CM | POA: Diagnosis present

## 2022-07-24 DIAGNOSIS — F32A Depression, unspecified: Secondary | ICD-10-CM | POA: Diagnosis not present

## 2022-07-24 DIAGNOSIS — K921 Melena: Secondary | ICD-10-CM | POA: Diagnosis not present

## 2022-07-24 DIAGNOSIS — E871 Hypo-osmolality and hyponatremia: Secondary | ICD-10-CM | POA: Diagnosis not present

## 2022-07-24 LAB — CBC
HCT: 20.3 % — ABNORMAL LOW (ref 36.0–46.0)
Hemoglobin: 5.3 g/dL — CL (ref 12.0–15.0)
MCH: 17.4 pg — ABNORMAL LOW (ref 26.0–34.0)
MCHC: 26.1 g/dL — ABNORMAL LOW (ref 30.0–36.0)
MCV: 66.6 fL — ABNORMAL LOW (ref 80.0–100.0)
Platelets: 337 10*3/uL (ref 150–400)
RBC: 3.05 MIL/uL — ABNORMAL LOW (ref 3.87–5.11)
RDW: 19 % — ABNORMAL HIGH (ref 11.5–15.5)
WBC: 5.5 10*3/uL (ref 4.0–10.5)
nRBC: 0 % (ref 0.0–0.2)

## 2022-07-24 LAB — BASIC METABOLIC PANEL
Anion gap: 5 (ref 5–15)
BUN: 11 mg/dL (ref 8–23)
CO2: 25 mmol/L (ref 22–32)
Calcium: 8.3 mg/dL — ABNORMAL LOW (ref 8.9–10.3)
Chloride: 105 mmol/L (ref 98–111)
Creatinine, Ser: 0.62 mg/dL (ref 0.44–1.00)
GFR, Estimated: >60 mL/min (ref >60)
Glucose, Bld: 91 mg/dL (ref 70–99)
Potassium: 4.5 mmol/L (ref 3.5–5.1)
Sodium: 135 mmol/L (ref 135–145)

## 2022-07-24 LAB — DIFFERENTIAL
Abs Immature Granulocytes: 0.02 10*3/uL (ref 0.00–0.07)
Basophils Absolute: 0 10*3/uL (ref 0.0–0.1)
Basophils Relative: 1 %
Eosinophils Absolute: 0.2 10*3/uL (ref 0.0–0.5)
Eosinophils Relative: 3 %
Immature Granulocytes: 0 %
Lymphocytes Relative: 18 %
Lymphs Abs: 1 10*3/uL (ref 0.7–4.0)
Monocytes Absolute: 0.5 10*3/uL (ref 0.1–1.0)
Monocytes Relative: 9 %
Neutro Abs: 3.8 10*3/uL (ref 1.7–7.7)
Neutrophils Relative %: 69 %

## 2022-07-24 LAB — TECHNOLOGIST SMEAR REVIEW

## 2022-07-24 MED ORDER — GABAPENTIN 400 MG PO CAPS
800.0000 mg | ORAL_CAPSULE | Freq: Three times a day (TID) | ORAL | Status: DC
  Administered 2022-07-24 – 2022-07-28 (×12): 800 mg via ORAL
  Filled 2022-07-24 (×12): qty 2

## 2022-07-24 MED ORDER — OXYBUTYNIN CHLORIDE ER 5 MG PO TB24
10.0000 mg | ORAL_TABLET | Freq: Two times a day (BID) | ORAL | Status: DC
  Administered 2022-07-24 – 2022-07-28 (×9): 10 mg via ORAL
  Filled 2022-07-24 (×9): qty 2

## 2022-07-24 MED ORDER — IOHEXOL 9 MG/ML PO SOLN
ORAL | Status: AC
  Filled 2022-07-24: qty 1000

## 2022-07-24 MED ORDER — DRONEDARONE HCL 400 MG PO TABS
400.0000 mg | ORAL_TABLET | Freq: Two times a day (BID) | ORAL | Status: DC
  Administered 2022-07-24 – 2022-07-28 (×7): 400 mg via ORAL
  Filled 2022-07-24 (×9): qty 1

## 2022-07-24 MED ORDER — POLYETHYLENE GLYCOL 3350 17 GM/SCOOP PO POWD
0.5000 | Freq: Once | ORAL | Status: DC
  Filled 2022-07-24: qty 255

## 2022-07-24 MED ORDER — LEVOTHYROXINE SODIUM 50 MCG PO TABS
75.0000 ug | ORAL_TABLET | Freq: Every day | ORAL | Status: DC
  Administered 2022-07-25 – 2022-07-28 (×4): 75 ug via ORAL
  Filled 2022-07-24 (×4): qty 1

## 2022-07-24 MED ORDER — IOHEXOL 300 MG/ML  SOLN
100.0000 mL | Freq: Once | INTRAMUSCULAR | Status: AC | PRN
  Administered 2022-07-24: 100 mL via INTRAVENOUS

## 2022-07-24 MED ORDER — POLYVINYL ALCOHOL 1.4 % OP SOLN: 1.0000 [drp] | Freq: Three times a day (TID) | OPHTHALMIC | Status: DC | PRN

## 2022-07-24 MED ORDER — DIPHENHYDRAMINE HCL 25 MG PO TABS: 25.0000 mg | ORAL_TABLET | Freq: Four times a day (QID) | ORAL | Status: DC | PRN

## 2022-07-24 MED ORDER — POLYETHYLENE GLYCOL 3350 17 GM/SCOOP PO POWD
119.0000 g | Freq: Once | ORAL | Status: AC
  Administered 2022-07-24: 119 g via ORAL
  Filled 2022-07-24: qty 238

## 2022-07-24 MED ORDER — DARBEPOETIN ALFA 60 MCG/0.3ML IJ SOSY
60.0000 ug | PREFILLED_SYRINGE | Freq: Once | INTRAMUSCULAR | Status: AC
  Administered 2022-07-24: 60 ug via SUBCUTANEOUS
  Filled 2022-07-24: qty 0.3

## 2022-07-24 MED ORDER — DILTIAZEM HCL ER COATED BEADS 240 MG PO CP24
240.0000 mg | ORAL_CAPSULE | Freq: Every day | ORAL | Status: DC
  Administered 2022-07-24 – 2022-07-28 (×5): 240 mg via ORAL
  Filled 2022-07-24 (×5): qty 1

## 2022-07-24 MED ORDER — PRAVASTATIN SODIUM 20 MG PO TABS
40.0000 mg | ORAL_TABLET | Freq: Every day | ORAL | Status: DC
  Administered 2022-07-24 – 2022-07-27 (×4): 40 mg via ORAL
  Filled 2022-07-24 (×4): qty 2

## 2022-07-24 MED ORDER — EZETIMIBE 10 MG PO TABS
10.0000 mg | ORAL_TABLET | Freq: Every day | ORAL | Status: DC
  Administered 2022-07-24 – 2022-07-28 (×5): 10 mg via ORAL
  Filled 2022-07-24 (×5): qty 1

## 2022-07-24 MED ORDER — DULOXETINE HCL 60 MG PO CPEP
60.0000 mg | ORAL_CAPSULE | Freq: Two times a day (BID) | ORAL | Status: DC
  Administered 2022-07-24 – 2022-07-28 (×9): 60 mg via ORAL
  Filled 2022-07-24 (×9): qty 1

## 2022-07-24 MED ORDER — ALPRAZOLAM 0.25 MG PO TABS
0.2500 mg | ORAL_TABLET | Freq: Two times a day (BID) | ORAL | Status: DC | PRN
  Administered 2022-07-26: 0.25 mg via ORAL
  Filled 2022-07-24: qty 1

## 2022-07-24 MED ORDER — IOHEXOL 9 MG/ML PO SOLN
500.0000 mL | ORAL | Status: AC
  Administered 2022-07-24 (×2): 500 mL via ORAL

## 2022-07-24 NOTE — Progress Notes (Signed)
PROGRESS NOTE  Sandra Ruiz:[id number] DOB: 10/16/1945 DOA: 07/23/2022 PCP: Jac Canavan, PA-C   LOS: 0 days   Brief Narrative / Interim history: 77 year old female with PAF on Eliquis, DM2, hypothyroidism, prior TIA comes into the hospital with generalized weakness for the past few months.  She also reports intermittent melena about 1 episode per month.  She was found to have a hemoglobin of 5.8 with no clear source of blood loss currently.  She was admitted to the hospital.  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  Reports constipation.  Assesement and Plan: Principal Problem:   Symptomatic anemia  Principal problem Symptomatic anemia -she has both iron and B12 deficiency, although B12 appears as "normal", it is on the very low end of normal -Unfortunately hemoglobin is dropping again this morning. -will not transfuse as per whishes, again I have discussed the risks of having severe anemia -IV iron, folic acid, B12.  Administer epo as well today   Active problems PAF - in sinus now, continue home medications   HLD -continue Home meds   Hypokalemia - replace K   Hyponatremia - mild, possbly dehydrated. Avoid IVF as it may dilute the Hb more, encouraged po intake   Hypothyroidism - resume synthroid   History of PUD - per patient, placed on BID PPI  Scheduled Meds:  cyanocobalamin  1,000 mcg Intramuscular Daily   darbepoetin (ARANESP) injection - NON-DIALYSIS  60 mcg Subcutaneous Once   diltiazem  240 mg Oral Daily   dronedarone  400 mg Oral BID WC   DULoxetine  60 mg Oral BID   ezetimibe  10 mg Oral Daily   gabapentin  800 mg Oral TID   iohexol  500 mL Oral Q1H   [START ON 07/25/2022] levothyroxine  75 mcg Oral QAC breakfast   oxybutynin  10 mg Oral BID   pantoprazole  40 mg Oral BID AC   pravastatin  40 mg Oral QHS   Continuous Infusions:  ferric gluconate (FERRLECIT)  IVPB 250 mg (07/24/22 1053)   folic acid 5 mg in sodium chloride 0.9 % 50 mL IVPB 5 mg (07/24/22 1004)   PRN Meds:.acetaminophen **OR** acetaminophen, bisacodyl, ondansetron **OR** ondansetron (ZOFRAN) IV  Current Outpatient Medications  Medication Instructions   ACCU-CHEK GUIDE test strip TEST 1 - 2 TIMES DAILY   Accu-Chek Softclix Lancets lancets Use as instructed   ALPRAZolam (XANAX) 0.25 mg, Oral, 2 times daily PRN   apixaban (ELIQUIS) 5 mg, Oral, 2 times daily   Artificial Tears ophthalmic solution 1 drop, Both Eyes, 3 times daily PRN   Benadryl Allergy 25 mg, Oral, Every 6 hours PRN   Blood Glucose Monitoring Suppl (ACCU-CHEK GUIDE ME) w/Device KIT Test 1-2 times daily   diltiazem (CARDIZEM) 30 MG tablet TAKE 1 TABLET BY MOUTH EVERY 4 HOURS AS NEEDED FOR AFIB HEART RATE >100 AS LONG AS TOP BP >100   diltiazem (DILT-XR) 240 MG 24 hr capsule TAKE 1 CAPSULE BY MOUTH DAILY   dronedarone (MULTAQ) 400 mg, Oral, 2 times daily with meals   DULoxetine (CYMBALTA) 60 mg, Oral, 2 times daily   ezetimibe (ZETIA) 10 MG tablet TAKE 1 TABLET BY MOUTH DAILY   furosemide (LASIX) 20 MG tablet TAKE 0.5 TABLET BY MOUTH DAILY AS NEEDED SWELLING OR WEIGHT GAIN   gabapentin (NEURONTIN) 800 mg, Oral, 3 times daily   levothyroxine (SYNTHROID) 75 MCG tablet TAKE  1 TABLET BY MOUTH DAILY  BEFORE BREAKFAST   Mounjaro 10 mg, Subcutaneous, Every Wed   nitroGLYCERIN (NITROSTAT) 0.4 MG SL tablet DISSOLVE 1 TABLET UNDER THE  TONGUE EVERY 5 MINUTES AS NEEDED FOR CHEST PAIN. MAX OF 3 TABLETS IN 15 MINUTES. CALL 911 IF PAIN  PERSISTS.   omeprazole (PRILOSEC) 20 MG capsule TAKE 1 CAPSULE BY MOUTH DAILY   oxybutynin (DITROPAN-XL) 10 MG 24 hr tablet TAKE 1 TABLET BY MOUTH TWICE  DAILY   pravastatin (PRAVACHOL) 40 mg, Oral, Every evening   TYLENOL 500-1,000 mg, Oral, Every 6 hours PRN    Diet Orders (From admission, onward)     Start     Ordered   07/23/22 1430  Diet regular Room service appropriate? Yes; Fluid  consistency: Thin  Diet effective now       Question Answer Comment  Room service appropriate? Yes   Fluid consistency: Thin      07/23/22 1429            DVT prophylaxis: SCDs Start: 07/23/22 1430   Lab Results  Component Value Date   PLT 337 07/24/2022      Code Status: Full Code  Family Communication: no family at bedside   Status is: Observation The patient will require care spanning > 2 midnights and should be moved to inpatient because: persistent anemia   Level of care: Telemetry  Consultants:  GI  Objective: Vitals:   07/23/22 1441 07/23/22 1728 07/23/22 2219 07/24/22 0159  BP: 136/62 (!) 105/53 (!) 112/56 124/69  Pulse: 85 75 79 77  Resp: 18  14 14   Temp: 98.3 F (36.8 C) 98 F (36.7 C) 98.1 F (36.7 C) 98 F (36.7 C)  TempSrc: Oral Oral Oral Oral  SpO2:  96% 94% 92%  Weight: 69.1 kg     Height: 5\' 1"  (1.549 m)       Intake/Output Summary (Last 24 hours) at 07/24/2022 1137 Last data filed at 07/24/2022 0900 Gross per 24 hour  Intake 940.3 ml  Output 700 ml  Net 240.3 ml   Wt Readings from Last 3 Encounters:  07/23/22 69.1 kg  07/22/22 69.1 kg  04/29/22 73.3 kg    Examination:  Constitutional: NAD Eyes: no scleral icterus ENMT: Mucous membranes are moist.  Neck: normal, supple Respiratory: clear to auscultation bilaterally, no wheezing, no crackles. Normal respiratory effort. No accessory muscle use.  Cardiovascular: Regular rate and rhythm, no murmurs / rubs / gallops. No LE edema.  Abdomen: non distended, no tenderness. Bowel sounds positive.  Musculoskeletal: no clubbing / cyanosis.  Skin: no rashes Neurologic: non focal   Data Reviewed: I have independently reviewed following labs and imaging studies   CBC Recent Labs  Lab 07/22/22 0905 07/23/22 1018 07/24/22 0603  WBC 6.8 5.6 5.5  HGB 6.4* 5.8* 5.3*  HCT 24.9* 22.0* 20.3*  PLT 491* 401* 337  MCV 65* 66.3* 66.6*  MCH 16.6* 17.5* 17.4*  MCHC 25.7* 26.4* 26.1*  RDW  18.1* 18.9* 19.0*    Recent Labs  Lab 07/22/22 0905 07/23/22 1018 07/24/22 0603  NA 137 133* 135  K 4.1 3.2* 4.5  CL 100 101 105  CO2 23 25 25   GLUCOSE 94 107* 91  BUN 13 15 11   CREATININE 0.80 0.76 0.62  CALCIUM 9.6 8.6* 8.3*  AST  --  24  --   ALT  --  18  --   ALKPHOS  --  78  --   BILITOT  --  0.4  --   ALBUMIN  --  3.5  --     ------------------------------------------------------------------------------------------------------------------ No results for input(s): "CHOL", "HDL", "LDLCALC", "TRIG", "CHOLHDL", "LDLDIRECT" in the last 72 hours.  Lab Results  Component Value Date   HGBA1C 6.3 (A) 04/28/2022   ------------------------------------------------------------------------------------------------------------------ No results for input(s): "TSH", "T4TOTAL", "T3FREE", "THYROIDAB" in the last 72 hours.  Invalid input(s): "FREET3"  Cardiac Enzymes No results for input(s): "CKMB", "TROPONINI", "MYOGLOBIN" in the last 168 hours.  Invalid input(s): "CK" ------------------------------------------------------------------------------------------------------------------    Component Value Date/Time   BNP 82.4 11/12/2020 1005    CBG: Recent Labs  Lab 07/23/22 0910  GLUCAP 149*    No results found for this or any previous visit (from the past 240 hour(s)).   Radiology Studies: No results found.   Pamella Pert, MD, PhD [facility]  Between 7 am - 7 pm I am available, please contact me via Amion (for emergencies) or Securechat (non urgent messages)  Between 7 pm - 7 am I am not available, please contact night coverage MD/APP via Amion

## 2022-07-24 NOTE — Consult Note (Signed)
[facility] Gastroenterology Consult  Referring Provider: No ref. provider found Primary Care Physician:  Jac Canavan, PA-C Primary Gastroenterologist: [facility]-Dr. Bosie Clos  Reason for Consultation: Persistent anemia  SUBJECTIVE:   HPI: Sandra Ruiz is a 77 y.o. female with past medical history significant for paroxysmal atrial fibrillation on Eliquis, diabetes mellitus, hypothyroidism, prior TIA, iron deficiency anemia, hyperlipidemia.  Presented to hospital on 07/23/2022 with generalized weakness.  She has some intermittent abdominal discomfort.  No nausea or vomiting.  Stool has been unremarkable in color.  No chest pain or shortness of breath.  Has lost weight intentionally over the past 1 year.  Fecal occult blood testing negative.  Hemoglobin 6.4 now 5.3 (patient is a Jehovah's Witness and will not accept blood products), microcytosis, BUN/creatinine 11/0.62.  CT scan of abdomen pelvis is pending.  EGD and colonoscopy completed 01/29/2021 for iron deficiency anemia (Dr. Bosie Clos) showed irregular Z-line at 42 cm, normal esophagus, gastritis status post biopsy, normal duodenum, ascending and transverse colon polyp, diverticulosis and hemorrhoids.  Past Medical History:  Diagnosis Date   Chronic pain    Depression    Diabetes mellitus without complication (HCC)    GERD (gastroesophageal reflux disease)    History of TIA (transient ischemic attack)    Hyperlipidemia    Hyperopia of both eyes with astigmatism and presbyopia 12/21/2019   Hypertension    Lyme disease    Paroxysmal atrial fibrillation (HCC)    Sleep apnea    Thyroid disease    Past Surgical History:  Procedure Laterality Date   ATRIAL FIBRILLATION ABLATION     x2 in [place]   CARDIAC CATHETERIZATION     CARDIOVERSION N/A 03/05/2021   Procedure: CARDIOVERSION;  Surgeon: Meriam Sprague, MD;  Location: [facility] ENDOSCOPY;  Service: Cardiovascular;  Laterality: N/A;   CERVICAL SPINE SURGERY     implantable loop recorder  placement  11/07/2017   MDT LINQ1 implanted in [place] for afib management by Dr Thedore Mins   LOOP RECORDER INSERTION N/A 11/06/2021   Procedure: LOOP RECORDER INSERTION;  Surgeon: Maurice Small, MD;  Location: [facility] INVASIVE CV LAB;  Service: Cardiovascular;  Laterality: N/A;   LOOP RECORDER REMOVAL N/A 11/06/2021   Procedure: LOOP RECORDER REMOVAL;  Surgeon: Maurice Small, MD;  Location: [facility] INVASIVE CV LAB;  Service: Cardiovascular;  Laterality: N/A;   LUMBAR SPINE SURGERY     TONSILLECTOMY AND ADENOIDECTOMY     Prior to Admission medications   Medication Sig Start Date End Date Taking? Authorizing Provider  ALPRAZolam (XANAX) 0.25 MG tablet Take 1 tablet (0.25 mg total) by mouth 2 (two) times daily as needed for anxiety. 06/11/21  Yes Tysinger, Kermit Balo, PA-C  apixaban (ELIQUIS) 5 MG TABS tablet Take 1 tablet (5 mg total) by mouth 2 (two) times daily. 10/01/21  Yes Tysinger, Kermit Balo, PA-C  Artificial Tears ophthalmic solution Place 1 drop into both eyes 3 (three) times daily as needed (for dryness).   Yes [provider]  BENADRYL ALLERGY 25 MG tablet Take 25 mg by mouth every 6 (six) hours as needed for itching.   Yes [provider]  diltiazem (CARDIZEM) 30 MG tablet TAKE 1 TABLET BY MOUTH EVERY 4 HOURS AS NEEDED FOR AFIB HEART RATE >100 AS LONG AS TOP BP >100 Patient taking differently: Take 30 mg by mouth every 4 (four) hours as needed (FOR AFIB HEART RATE >100 AS LONG AS TOP BP >100). 04/02/22  Yes Fenton, Clint R, PA  diltiazem (DILT-XR) 240 MG 24  hr capsule TAKE 1 CAPSULE BY MOUTH DAILY Patient taking differently: Take 240 mg by mouth daily. 03/17/22  Yes Sheilah Pigeon, PA-C  dronedarone (MULTAQ) 400 MG tablet Take 1 tablet (400 mg total) by mouth 2 (two) times daily with a meal. 04/29/22  Yes Sheilah Pigeon, PA-C  DULoxetine (CYMBALTA) 60 MG capsule TAKE 1 CAPSULE BY MOUTH TWICE  DAILY 04/15/22  Yes Tysinger, Kermit Balo, PA-C  ezetimibe (ZETIA) 10 MG tablet TAKE 1 TABLET BY  MOUTH DAILY Patient taking differently: Take 10 mg by mouth daily. 11/17/21  Yes Tysinger, Kermit Balo, PA-C  furosemide (LASIX) 20 MG tablet TAKE 0.5 TABLET BY MOUTH DAILY AS NEEDED SWELLING OR WEIGHT GAIN Patient taking differently: Take 10 mg by mouth daily as needed ("for swelling or weight gain"). 09/14/21  Yes Tysinger, Kermit Balo, PA-C  gabapentin (NEURONTIN) 800 MG tablet Take 1 tablet (800 mg total) by mouth 3 (three) times daily. 05/06/22  Yes Tysinger, Kermit Balo, PA-C  levothyroxine (SYNTHROID) 75 MCG tablet TAKE 1 TABLET BY MOUTH DAILY  BEFORE BREAKFAST Patient taking differently: Take 75 mcg by mouth daily before breakfast. 05/11/22  Yes Tysinger, Kermit Balo, PA-C  [facility] 10 MG/0.5ML Pen Inject 10 mg into the skin every Wednesday. 06/17/22  Yes [provider]  nitroGLYCERIN (NITROSTAT) 0.4 MG SL tablet DISSOLVE 1 TABLET UNDER THE  TONGUE EVERY 5 MINUTES AS NEEDED FOR CHEST PAIN. MAX OF 3 TABLETS IN 15 MINUTES. CALL 911 IF PAIN  PERSISTS. Patient taking differently: Place 0.4 mg under the tongue every 5 (five) minutes x 3 doses as needed for chest pain (CALL 9-1-1 IF PAIN PERSISTS). 05/11/22  Yes Lewayne Bunting, MD  omeprazole (PRILOSEC) 20 MG capsule TAKE 1 CAPSULE BY MOUTH DAILY Patient taking differently: Take 20 mg by mouth daily before breakfast. 05/11/22  Yes Tysinger, Kermit Balo, PA-C  oxybutynin (DITROPAN-XL) 10 MG 24 hr tablet TAKE 1 TABLET BY MOUTH TWICE  DAILY Patient taking differently: Take 10 mg by mouth in the morning and at bedtime. 11/17/21  Yes Tysinger, Kermit Balo, PA-C  pravastatin (PRAVACHOL) 40 MG tablet TAKE 1 TABLET BY MOUTH IN THE  EVENING Patient taking differently: Take 40 mg by mouth at bedtime. 05/11/22  Yes Tysinger, Kermit Balo, PA-C  TYLENOL 500 MG tablet Take 500-1,000 mg by mouth every 6 (six) hours as needed for mild pain or headache.   Yes [provider]  ACCU-CHEK GUIDE test strip TEST 1 - 2 TIMES DAILY 05/14/22   Tysinger, Kermit Balo, PA-C  Accu-Chek Softclix  Lancets lancets Use as instructed 07/21/22   Tysinger, Kermit Balo, PA-C  Blood Glucose Monitoring Suppl (ACCU-CHEK GUIDE ME) w/Device KIT Test 1-2 times daily 05/14/22   Tysinger, Kermit Balo, PA-C   Current Facility-Administered Medications  Medication Dose Route Frequency Provider Last Rate Last Admin   acetaminophen (TYLENOL) tablet 650 mg  650 mg Oral Q6H PRN Leatha Gilding, MD   650 mg at 07/23/22 1721   Or   acetaminophen (TYLENOL) suppository 650 mg  650 mg Rectal Q6H PRN Leatha Gilding, MD       ALPRAZolam Prudy Feeler) tablet 0.25 mg  0.25 mg Oral BID PRN Leatha Gilding, MD       bisacodyl (DULCOLAX) EC tablet 5 mg  5 mg Oral Daily PRN Leatha Gilding, MD   5 mg at 07/23/22 2208   cyanocobalamin (VITAMIN B12) injection 1,000 mcg  1,000 mcg Intramuscular Daily Leatha Gilding, MD   1,000 mcg at  07/24/22 1002   Darbepoetin Alfa (ARANESP) injection 60 mcg  60 mcg Subcutaneous Once Leatha Gilding, MD       diltiazem (CARDIZEM CD) 24 hr capsule 240 mg  240 mg Oral Daily Gherghe, Daylene Katayama, MD       diphenhydrAMINE (BENADRYL) tablet 25 mg  25 mg Oral Q6H PRN Leatha Gilding, MD       dronedarone (MULTAQ) tablet 400 mg  400 mg Oral BID WC Gherghe, Daylene Katayama, MD       DULoxetine (CYMBALTA) DR capsule 60 mg  60 mg Oral BID Leatha Gilding, MD       ezetimibe (ZETIA) tablet 10 mg  10 mg Oral Daily Gherghe, Daylene Katayama, MD       ferric gluconate (FERRLECIT) 250 mg in sodium chloride 0.9 % 250 mL IVPB  250 mg Intravenous Daily Leatha Gilding, MD 135 mL/hr at 07/24/22 1053 250 mg at 07/24/22 1053   folic acid 5 mg in sodium chloride 0.9 % 50 mL IVPB  5 mg Intravenous Daily Leatha Gilding, MD 102 mL/hr at 07/24/22 1004 5 mg at 07/24/22 1004   gabapentin (NEURONTIN) capsule 800 mg  800 mg Oral TID Leatha Gilding, MD       [START ON 07/25/2022] levothyroxine (SYNTHROID) tablet 75 mcg  75 mcg Oral Q0600 Leatha Gilding, MD       ondansetron [facility]) tablet 4 mg  4 mg Oral Q6H PRN Leatha Gilding, MD       Or   ondansetron (ZOFRAN) injection 4 mg  4 mg Intravenous Q6H PRN Leatha Gilding, MD       oxybutynin (DITROPAN-XL) 24 hr tablet 10 mg  10 mg Oral BID Leatha Gilding, MD       pantoprazole (PROTONIX) EC tablet 40 mg  40 mg Oral BID AC Leatha Gilding, MD   40 mg at 07/24/22 4166   polyvinyl alcohol (LIQUIFILM TEARS) 1.4 % ophthalmic solution 1 drop  1 drop Both Eyes TID PRN Leatha Gilding, MD       pravastatin (PRAVACHOL) tablet 40 mg  40 mg Oral QHS Leatha Gilding, MD       Allergies as of 07/23/2022 - Review Complete 07/23/2022  Allergen Reaction Noted   Atorvastatin Other (See Comments) 01/02/2019   Baclofen Swelling 01/02/2019   Crestor [rosuvastatin] Other (See Comments) 01/02/2019   Metformin and related Nausea Only 02/26/2021   Verapamil Swelling 01/02/2019   Silicone Itching and Rash 12/13/2019   Wellbutrin [bupropion] Anxiety and Other (See Comments) 10/01/2021   Family History  Problem Relation Age of Onset   CAD Mother    Thyroid disease Mother    CAD Father    Thyroid disease Daughter    Hypothyroidism Daughter    Social History   Socioeconomic History   Marital status: Married    Spouse name: Not on file   Number of children: 5   Years of education: Not on file   Highest education level: Some college, no degree  Occupational History   Not on file  Tobacco Use   Smoking status: Former   Smokeless tobacco: Never   Tobacco comments:    Former smoker 11/12/2020  Vaping Use   Vaping status: Never Used  Substance and Sexual Activity   Alcohol use: Yes    Alcohol/week: 2.0 standard drinks of alcohol    Types: 2 Standard drinks or equivalent per week    Comment: mix drinks  occ.   Drug use: Never   Sexual activity: Not on file  Other Topics Concern   Not on file  Social History Narrative   From [address], married, [employer], new from [place] to [place]  07/2019   Right handed   Some caffeine use     Lives with husband, daughter, and son-in-law   Social Determinants of Health   Financial Resource Strain: Low Risk  (10/30/2021)   Overall Financial Resource Strain (CARDIA)    Difficulty of Paying Living Expenses: Not hard at all  Food Insecurity: No Food Insecurity (07/23/2022)   Hunger Vital Sign    Worried About Running Out of Food in the Last Year: Never true    Ran Out of Food in the Last Year: Never true  Transportation Needs: No Transportation Needs (07/23/2022)   PRAPARE - Administrator, Civil Service (Medical): No    Lack of Transportation (Non-Medical): No  Physical Activity: Inactive (10/30/2021)   Exercise Vital Sign    Days of Exercise per Week: 0 days    Minutes of Exercise per Session: 0 min  Stress: Stress Concern Present (10/30/2021)   [employer] of Occupational Health - Occupational Stress Questionnaire    Feeling of Stress : To some extent  Social Connections: Not on file  Intimate Partner Violence: Not At Risk (07/23/2022)   Humiliation, Afraid, Rape, and Kick questionnaire    Fear of Current or Ex-Partner: No    Emotionally Abused: No    Physically Abused: No    Sexually Abused: No   Review of Systems:  Review of Systems  Constitutional:  Positive for weight loss (Intentional).       Poor appetite  Respiratory:  Negative for shortness of breath.   Cardiovascular:  Negative for chest pain.  Gastrointestinal:  Positive for abdominal pain. Negative for blood in stool, constipation, diarrhea, nausea and vomiting.    OBJECTIVE:   Temp:  [98 F (36.7 C)-98.7 F (37.1 C)] 98 F (36.7 C) (07/20 0159) Pulse Rate:  [75-85] 77 (07/20 0159) Resp:  [14-18] 14 (07/20 0159) BP: (105-136)/(53-69) 124/69 (07/20 0159) SpO2:  [92 %-97 %] 92 % (07/20 0159) Weight:  [69.1 kg] 69.1 kg (07/19 1441) Last BM Date : 07/21/22 Physical Exam Constitutional:      General: She is not in acute distress.    Appearance: She is not ill-appearing,  toxic-appearing or diaphoretic.  Cardiovascular:     Rate and Rhythm: Normal rate and regular rhythm.  Pulmonary:     Effort: No respiratory distress.     Breath sounds: Normal breath sounds.  Abdominal:     General: Bowel sounds are normal. There is no distension.     Palpations: Abdomen is soft.     Tenderness: There is no abdominal tenderness. There is no guarding.  Musculoskeletal:     Right lower leg: No edema.     Left lower leg: No edema.  Skin:    General: Skin is warm and dry.  Neurological:     Mental Status: She is alert.     Labs: Recent Labs    07/22/22 0905 07/23/22 1018 07/24/22 0603  WBC 6.8 5.6 5.5  HGB 6.4* 5.8* 5.3*  HCT 24.9* 22.0* 20.3*  PLT 491* 401* 337   BMET Recent Labs    07/22/22 0905 07/23/22 1018 07/24/22 0603  NA 137 133* 135  K 4.1 3.2* 4.5  CL 100 101 105  CO2 23 25 25  GLUCOSE 94 107* 91  BUN 13 15 11   CREATININE 0.80 0.76 0.62  CALCIUM 9.6 8.6* 8.3*   LFT Recent Labs    07/23/22 1018  PROT 6.5  ALBUMIN 3.5  AST 24  ALT 18  ALKPHOS 78  BILITOT 0.4  BILIDIR <0.1  IBILI NOT CALCULATED   PT/INR No results for input(s): "LABPROT", "INR" in the last 72 hours.  Diagnostic imaging: No results found.  IMPRESSION: Iron deficiency anemia Generalized weakness secondary to above Personal history colon polyps History diverticulosis Paroxysmal atrial fibrillation on Eliquis Diabetes mellitus  PLAN: -Follow-up CT abdomen pelvis results -Recommend video capsule endoscopy to further evaluate iron deficiency anemia, discussed this with patient and her spouse at bedside, discussed benefits, alternatives and risks including retention of video capsule, she relies understanding elected to proceed -Continue iron supplementation, patient noted that she would except erythropoietin -Trend H/H -Consider hematology evaluation -[facility] will follow   LOS: 0 days   Liliane Shi, [facility] Gastroenterology

## 2022-07-24 NOTE — Progress Notes (Signed)
Pt alert and oriented, pleasant and cooperative.  Pt with complaints of constipation related to iron infusion, notified oncall hospitalist and orders placed.  Gave pt medication and pt able to have bowel movement as documented in chart.  Pt up to [facility] several times throughout shift, hands on assist to [facility].  Pt with mild swelling to right hand, ice pack offered with noted relief. UA collected as ordered. Pt resting quietly throughout shift.

## 2022-07-24 NOTE — Plan of Care (Deleted)
Pt alert and oriented, educated patient on activity intolerance, signs and symptoms, shortness of breath.  Pt ambulating to bathroom independently throughout shift with no complaints of sob.  Pt with glucose checks throughout shift, WNL.  Educated pt regarding D5NS running helping to maintain BG levels, pt verbalizes understanding.  Encouraged pt to eat snacks throughout day.  Pt with mild infiltration to right wrist, removed IV and notified IV team for placement of new IV.  Pt tolerated well. Pt with episode of headache near time for BG check.  Prn medication given, BG WNL. Pt resting quietly throughout shift with little needs.

## 2022-07-25 ENCOUNTER — Encounter (HOSPITAL_COMMUNITY)
Admission: EM | Disposition: A | Discharge status: Home / Self Care | Payer: Self-pay | Source: Home / Self Care | Attending: Internal Medicine

## 2022-07-25 DIAGNOSIS — D649 Anemia, unspecified: Secondary | ICD-10-CM | POA: Diagnosis not present

## 2022-07-25 HISTORY — PX: GIVENS CAPSULE STUDY: SHX5432

## 2022-07-25 LAB — CBC
HCT: 21.6 % — ABNORMAL LOW (ref 36.0–46.0)
Hemoglobin: 5.6 g/dL — CL (ref 12.0–15.0)
MCH: 17.5 pg — ABNORMAL LOW (ref 26.0–34.0)
MCHC: 25.9 g/dL — ABNORMAL LOW (ref 30.0–36.0)
MCV: 67.5 fL — ABNORMAL LOW (ref 80.0–100.0)
Platelets: 361 10*3/uL (ref 150–400)
RBC: 3.2 MIL/uL — ABNORMAL LOW (ref 3.87–5.11)
RDW: 19.3 % — ABNORMAL HIGH (ref 11.5–15.5)
WBC: 6.3 10*3/uL (ref 4.0–10.5)
nRBC: 0 % (ref 0.0–0.2)

## 2022-07-25 SURGERY — IMAGING PROCEDURE, GI TRACT, INTRALUMINAL, VIA CAPSULE
Anesthesia: LOCAL

## 2022-07-25 MED ORDER — FOLIC ACID 1 MG PO TABS
1.0000 mg | ORAL_TABLET | Freq: Every day | ORAL | Status: DC
  Administered 2022-07-26 – 2022-07-28 (×3): 1 mg via ORAL
  Filled 2022-07-25 (×3): qty 1

## 2022-07-25 SURGICAL SUPPLY — 1 items: TOWEL COTTON PACK 4EA (MISCELLANEOUS) ×2 IMPLANT

## 2022-07-25 NOTE — Progress Notes (Signed)
PROGRESS NOTE  CHERL GORNEY VHQ:[id number] DOB: February 06, 1945 DOA: 07/23/2022 PCP: Jac Canavan, PA-C   LOS: 1 day   Brief Narrative / Interim history: 77 year old female with PAF on Eliquis, DM2, hypothyroidism, prior TIA comes into the hospital with generalized weakness for the past few months.  She also reports intermittent melena about 1 episode per month.  She was found to have a hemoglobin of 5.8 with no clear source of blood loss currently.  She was admitted to the hospital.  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  Subjective / 24h Interval events: Continues to feel weak  Assesement and Plan: Principal Problem:   Symptomatic anemia  Principal problem Symptomatic anemia -she has both iron and B12 deficiency, although B12 appears as "normal", it is on the very low end of normal -Hemoglobin about the same as yesterday, slightly higher at 5.6 -will not transfuse as per whishes, again I have discussed with her and her husband risk of an unexpected MI /adverse risk of having a hemoglobin less than 7 -Received IV iron x 4, folic acid, B12 I'm x 5, status post EPO   Active problems PAF - in sinus now, continue home medications except anticoagulation   HLD -continue Home meds   Hypokalemia - replace K   Hyponatremia - mild, possbly dehydrated. Avoid IVF as it may dilute the Hb more, encouraged po intake   Hypothyroidism - resume synthroid   History of PUD, melena- placed on BID PPI.  GI consulted, undergoing a capsule study to evaluate for occult blood loss  Scheduled Meds:  cyanocobalamin  1,000 mcg Intramuscular Daily   diltiazem  240 mg Oral Daily   dronedarone  400 mg Oral BID WC   DULoxetine  60 mg Oral BID   ezetimibe  10 mg Oral Daily   [START ON 07/26/2022] folic acid  1 mg Oral Daily   gabapentin  800 mg Oral TID   levothyroxine  75 mcg Oral Q0600   oxybutynin  10 mg Oral BID   pantoprazole  40 mg Oral BID AC   pravastatin  40 mg  Oral QHS   Continuous Infusions:  ferric gluconate (FERRLECIT) IVPB 250 mg (07/25/22 1155)   PRN Meds:.acetaminophen **OR** acetaminophen, ALPRAZolam, bisacodyl, diphenhydrAMINE, ondansetron **OR** ondansetron (ZOFRAN) IV, polyvinyl alcohol  Current Outpatient Medications  Medication Instructions   ACCU-CHEK GUIDE test strip TEST 1 - 2 TIMES DAILY   Accu-Chek Softclix Lancets lancets Use as instructed   ALPRAZolam (XANAX) 0.25 mg, Oral, 2 times daily PRN   apixaban (ELIQUIS) 5 mg, Oral, 2 times daily   Artificial Tears ophthalmic solution 1 drop, Both Eyes, 3 times daily PRN   Benadryl Allergy 25 mg, Oral, Every 6 hours PRN   Blood Glucose Monitoring Suppl (ACCU-CHEK GUIDE ME) w/Device KIT Test 1-2 times daily   diltiazem (CARDIZEM) 30 MG tablet TAKE 1 TABLET BY MOUTH EVERY 4 HOURS AS NEEDED FOR AFIB HEART RATE >100 AS LONG AS TOP BP >100   diltiazem (DILT-XR) 240 MG 24 hr capsule TAKE 1 CAPSULE BY MOUTH DAILY   dronedarone (MULTAQ) 400 mg, Oral, 2 times daily with meals   DULoxetine (CYMBALTA) 60 mg, Oral, 2 times daily   ezetimibe (ZETIA) 10 MG tablet TAKE 1 TABLET BY MOUTH DAILY   furosemide (LASIX) 20 MG tablet TAKE 0.5 TABLET BY MOUTH DAILY AS NEEDED SWELLING OR WEIGHT GAIN   gabapentin (NEURONTIN) 800 mg, Oral, 3 times daily   levothyroxine (SYNTHROID) 75  MCG tablet TAKE 1 TABLET BY MOUTH DAILY  BEFORE BREAKFAST   Mounjaro 10 mg, Subcutaneous, Every Wed   nitroGLYCERIN (NITROSTAT) 0.4 MG SL tablet DISSOLVE 1 TABLET UNDER THE  TONGUE EVERY 5 MINUTES AS NEEDED FOR CHEST PAIN. MAX OF 3 TABLETS IN 15 MINUTES. CALL 911 IF PAIN  PERSISTS.   omeprazole (PRILOSEC) 20 MG capsule TAKE 1 CAPSULE BY MOUTH DAILY   oxybutynin (DITROPAN-XL) 10 MG 24 hr tablet TAKE 1 TABLET BY MOUTH TWICE  DAILY   pravastatin (PRAVACHOL) 40 mg, Oral, Every evening   TYLENOL 500-1,000 mg, Oral, Every 6 hours PRN    Diet Orders (From admission, onward)     Start     Ordered   07/25/22 0001  Diet NPO time  specified Except for: Sips with Meds, Ice Chips  Diet effective midnight       Comments: For video capsule endoscopy 07/25/22.  Question Answer Comment  Except for Sips with Meds   Except for Ice Chips      07/24/22 1405            DVT prophylaxis: SCDs Start: 07/23/22 1430   Lab Results  Component Value Date   PLT 361 07/25/2022      Code Status: Full Code  Family Communication: no family at bedside   Status is: Inpatient   Level of care: Telemetry  Consultants:  GI  Objective: Vitals:   07/24/22 2135 07/24/22 2200 07/25/22 0544 07/25/22 0941  BP: (!) 143/61  (!) 135/53   Pulse: 82  86   Resp: 18  18   Temp: 98.4 F (36.9 C)  97.6 F (36.4 C)   TempSrc: Oral  Oral   SpO2: 90% 95% 96%   Weight:    69.1 kg  Height:    5\' 1"  (1.549 m)    Intake/Output Summary (Last 24 hours) at 07/25/2022 1217 Last data filed at 07/25/2022 0700 Gross per 24 hour  Intake 180 ml  Output 1600 ml  Net -1420 ml   Wt Readings from Last 3 Encounters:  07/25/22 69.1 kg  07/22/22 69.1 kg  04/29/22 73.3 kg    Examination:  Constitutional: NAD Eyes: lids and conjunctivae normal, no scleral icterus ENMT: mmm Neck: normal, supple Respiratory: clear to auscultation bilaterally, no wheezing, no crackles. Normal respiratory effort.  Cardiovascular: Regular rate and rhythm, no murmurs / rubs / gallops. No LE edema. Abdomen: soft, no distention, no tenderness. Bowel sounds positive.   Data Reviewed: I have independently reviewed following labs and imaging studies   CBC Recent Labs  Lab 07/22/22 0905 07/23/22 1018 07/24/22 0603 07/25/22 0611  WBC 6.8 5.6 5.5 6.3  HGB 6.4* 5.8* 5.3* 5.6*  HCT 24.9* 22.0* 20.3* 21.6*  PLT 491* 401* 337 361  MCV 65* 66.3* 66.6* 67.5*  MCH 16.6* 17.5* 17.4* 17.5*  MCHC 25.7* 26.4* 26.1* 25.9*  RDW 18.1* 18.9* 19.0* 19.3*  LYMPHSABS  --   --  1.0  --   MONOABS  --   --  0.5  --   EOSABS  --   --  0.2  --   BASOSABS  --   --  0.0  --      Recent Labs  Lab 07/22/22 0905 07/23/22 1018 07/24/22 0603  NA 137 133* 135  K 4.1 3.2* 4.5  CL 100 101 105  CO2 23 25 25   GLUCOSE 94 107* 91  BUN 13 15 11   CREATININE 0.80 0.76 0.62  CALCIUM 9.6 8.6* 8.3*  AST  --  24  --   ALT  --  18  --   ALKPHOS  --  78  --   BILITOT  --  0.4  --   ALBUMIN  --  3.5  --     ------------------------------------------------------------------------------------------------------------------ No results for input(s): "CHOL", "HDL", "LDLCALC", "TRIG", "CHOLHDL", "LDLDIRECT" in the last 72 hours.  Lab Results  Component Value Date   HGBA1C 6.3 (A) 04/28/2022   ------------------------------------------------------------------------------------------------------------------ No results for input(s): "TSH", "T4TOTAL", "T3FREE", "THYROIDAB" in the last 72 hours.  Invalid input(s): "FREET3"  Cardiac Enzymes No results for input(s): "CKMB", "TROPONINI", "MYOGLOBIN" in the last 168 hours.  Invalid input(s): "CK" ------------------------------------------------------------------------------------------------------------------    Component Value Date/Time   BNP 82.4 11/12/2020 1005    CBG: Recent Labs  Lab 07/23/22 0910  GLUCAP 149*    No results found for this or any previous visit (from the past 240 hour(s)).   Radiology Studies: CT ABDOMEN PELVIS W CONTRAST  Result Date: 07/24/2022 CLINICAL DATA:  Acute abdominal pain and dropping hemoglobin, initial encounter EXAM: CT ABDOMEN AND PELVIS WITH CONTRAST TECHNIQUE: Multidetector CT imaging of the abdomen and pelvis was performed using the standard protocol following bolus administration of intravenous contrast. RADIATION DOSE REDUCTION: This exam was performed according to the departmental dose-optimization program which includes automated exposure control, adjustment of the mA and/or kV according to patient size and/or use of iterative reconstruction technique. CONTRAST:   OMNIPAQUE IOHEXOL 300 MG/ML  SOLN COMPARISON:  None Available. FINDINGS: Lower chest: No acute abnormality. Hepatobiliary: No focal liver abnormality is seen. No gallstones, gallbladder wall thickening, or biliary dilatation. Pancreas: Unremarkable. No pancreatic ductal dilatation or surrounding inflammatory changes. Spleen: Normal in size without focal abnormality. Adrenals/Urinary Tract: Adrenal glands are within normal limits. Kidneys demonstrate a normal enhancement pattern bilaterally. No renal calculi or obstructive changes are noted. Normal excretion is noted on delayed images. A bladder is well distended. Stomach/Bowel: Scattered diverticular change of the colon is noted without evidence of diverticulitis. The appendix is within normal limits. Stomach and small bowel are unremarkable. Vascular/Lymphatic: Aortic atherosclerosis. No enlarged abdominal or pelvic lymph nodes. Reproductive: 4.5 cm exophytic uterine fibroid is noted on the right. No adnexal mass lesion is noted. Other: Small fat containing inguinal hernia is noted on the right. Musculoskeletal: No acute or significant osseous findings. IMPRESSION: Diverticulosis without evidence of diverticulitis. Uterine fibroid on the right. No acute abnormality is noted to correspond given history. Electronically Signed   By: Alcide Clever M.D.   On: 07/24/2022 18:37     Pamella Pert, MD, PhD [facility]  Between 7 am - 7 pm I am available, please contact me via Amion (for emergencies) or Securechat (non urgent messages)  Between 7 pm - 7 am I am not available, please contact night coverage MD/APP via Amion

## 2022-07-25 NOTE — Progress Notes (Signed)
[facility] Progress Note  SUBJECTIVE:   Interval history: Sandra Ruiz was seen and evaluated today at bedside. Spouse at bedside. Noted having some lower abdominal discomfort. Video capsule endoscopy ingested this AM, results will be available tomorrow. Had some GI upset with contrast and Miralax, though no vomiting. Had diarrhea with bowel prep for video capsule endoscopy, no blood in stool. No chest pain or shortness of breath. Does have fatigue.   Past Medical History:  Diagnosis Date   Chronic pain    Depression    Diabetes mellitus without complication (HCC)    GERD (gastroesophageal reflux disease)    History of TIA (transient ischemic attack)    Hyperlipidemia    Hyperopia of both eyes with astigmatism and presbyopia 12/21/2019   Hypertension    Lyme disease    Paroxysmal atrial fibrillation (HCC)    Sleep apnea    Thyroid disease    Past Surgical History:  Procedure Laterality Date   ATRIAL FIBRILLATION ABLATION     x2 in [place]   CARDIAC CATHETERIZATION     CARDIOVERSION N/A 03/05/2021   Procedure: CARDIOVERSION;  Surgeon: Meriam Sprague, MD;  Location: [facility] ENDOSCOPY;  Service: Cardiovascular;  Laterality: N/A;   CERVICAL SPINE SURGERY     implantable loop recorder placement  11/07/2017   MDT LINQ1 implanted in [place] for afib management by Dr Thedore Mins   LOOP RECORDER INSERTION N/A 11/06/2021   Procedure: LOOP RECORDER INSERTION;  Surgeon: Maurice Small, MD;  Location: [facility] INVASIVE CV LAB;  Service: Cardiovascular;  Laterality: N/A;   LOOP RECORDER REMOVAL N/A 11/06/2021   Procedure: LOOP RECORDER REMOVAL;  Surgeon: Maurice Small, MD;  Location: [facility] INVASIVE CV LAB;  Service: Cardiovascular;  Laterality: N/A;   LUMBAR SPINE SURGERY     TONSILLECTOMY AND ADENOIDECTOMY     Current Facility-Administered Medications  Medication Dose Route Frequency Provider Last Rate Last Admin   acetaminophen (TYLENOL) tablet 650 mg  650 mg Oral Q6H PRN Leatha Gilding,  MD   650 mg at 07/23/22 1721   Or   acetaminophen (TYLENOL) suppository 650 mg  650 mg Rectal Q6H PRN Leatha Gilding, MD       ALPRAZolam Prudy Feeler) tablet 0.25 mg  0.25 mg Oral BID PRN Leatha Gilding, MD       bisacodyl (DULCOLAX) EC tablet 5 mg  5 mg Oral Daily PRN Leatha Gilding, MD   5 mg at 07/23/22 2208   cyanocobalamin (VITAMIN B12) injection 1,000 mcg  1,000 mcg Intramuscular Daily Leatha Gilding, MD   1,000 mcg at 07/24/22 1002   diltiazem (CARDIZEM CD) 24 hr capsule 240 mg  240 mg Oral Daily Pamella Pert M, MD   240 mg at 07/25/22 1155   diphenhydrAMINE (BENADRYL) tablet 25 mg  25 mg Oral Q6H PRN Leatha Gilding, MD       dronedarone (MULTAQ) tablet 400 mg  400 mg Oral BID WC Leatha Gilding, MD   400 mg at 07/24/22 1623   DULoxetine (CYMBALTA) DR capsule 60 mg  60 mg Oral BID Leatha Gilding, MD   60 mg at 07/25/22 1155   ezetimibe (ZETIA) tablet 10 mg  10 mg Oral Daily Leatha Gilding, MD   10 mg at 07/25/22 1155   ferric gluconate (FERRLECIT) 250 mg in sodium chloride 0.9 % 250 mL IVPB  250 mg Intravenous Daily Leatha Gilding, MD 135 mL/hr at 07/25/22 1155 250 mg at 07/25/22 1155   [  START ON 07/26/2022] folic acid (FOLVITE) tablet 1 mg  1 mg Oral Daily Gherghe, Costin M, MD       gabapentin (NEURONTIN) capsule 800 mg  800 mg Oral TID Leatha Gilding, MD   800 mg at 07/25/22 1155   levothyroxine (SYNTHROID) tablet 75 mcg  75 mcg Oral Q0600 Leatha Gilding, MD   75 mcg at 07/25/22 0552   ondansetron (ZOFRAN) tablet 4 mg  4 mg Oral Q6H PRN Leatha Gilding, MD       Or   ondansetron (ZOFRAN) injection 4 mg  4 mg Intravenous Q6H PRN Leatha Gilding, MD       oxybutynin (DITROPAN-XL) 24 hr tablet 10 mg  10 mg Oral BID Pamella Pert M, MD   10 mg at 07/25/22 1155   pantoprazole (PROTONIX) EC tablet 40 mg  40 mg Oral BID AC Leatha Gilding, MD   40 mg at 07/25/22 4098   polyvinyl alcohol (LIQUIFILM TEARS) 1.4 % ophthalmic solution 1 drop  1 drop Both Eyes  TID PRN Leatha Gilding, MD       pravastatin (PRAVACHOL) tablet 40 mg  40 mg Oral QHS Leatha Gilding, MD   40 mg at 07/24/22 2102   Allergies as of 07/23/2022 - Review Complete 07/23/2022  Allergen Reaction Noted   Atorvastatin Other (See Comments) 01/02/2019   Baclofen Swelling 01/02/2019   Crestor [rosuvastatin] Other (See Comments) 01/02/2019   Metformin and related Nausea Only 02/26/2021   Verapamil Swelling 01/02/2019   Silicone Itching and Rash 12/13/2019   Wellbutrin [bupropion] Anxiety and Other (See Comments) 10/01/2021   Review of Systems:  Review of Systems  Constitutional:  Positive for malaise/fatigue.  Respiratory:  Negative for shortness of breath.   Cardiovascular:  Negative for chest pain.  Gastrointestinal:  Positive for abdominal pain, diarrhea and nausea. Negative for blood in stool and vomiting.    OBJECTIVE:   Temp:  [97.6 F (36.4 C)-98.4 F (36.9 C)] 98.1 F (36.7 C) (07/21 1259) Pulse Rate:  [82-86] 84 (07/21 1259) Resp:  [16-18] 16 (07/21 1259) BP: (123-143)/(53-61) 123/55 (07/21 1259) SpO2:  [90 %-96 %] 96 % (07/21 1259) Weight:  [69.1 kg] 69.1 kg (07/21 0941) Last BM Date : 07/24/22 Physical Exam Constitutional:      General: She is not in acute distress.    Appearance: She is not ill-appearing, toxic-appearing or diaphoretic.  Cardiovascular:     Rate and Rhythm: Normal rate. Rhythm irregular.  Pulmonary:     Effort: No respiratory distress.     Breath sounds: Normal breath sounds.  Abdominal:     General: Bowel sounds are normal. There is no distension.     Palpations: Abdomen is soft.     Tenderness: There is abdominal tenderness (bilateral lower quadrants, some right sided pain). There is no guarding.  Musculoskeletal:     Right lower leg: No edema.     Left lower leg: No edema.  Skin:    General: Skin is warm and dry.  Neurological:     Mental Status: She is alert.     Labs: Recent Labs    07/23/22 1018 07/24/22 0603  07/25/22 0611  WBC 5.6 5.5 6.3  HGB 5.8* 5.3* 5.6*  HCT 22.0* 20.3* 21.6*  PLT 401* 337 361   BMET Recent Labs    07/23/22 1018 07/24/22 0603  NA 133* 135  K 3.2* 4.5  CL 101 105  CO2 25 25  GLUCOSE 107* 91  BUN 15 11  CREATININE 0.76 0.62  CALCIUM 8.6* 8.3*   LFT Recent Labs    07/23/22 1018  PROT 6.5  ALBUMIN 3.5  AST 24  ALT 18  ALKPHOS 78  BILITOT 0.4  BILIDIR <0.1  IBILI NOT CALCULATED   PT/INR No results for input(s): "LABPROT", "INR" in the last 72 hours. Diagnostic imaging: CT ABDOMEN PELVIS W CONTRAST  Result Date: 07/24/2022 CLINICAL DATA:  Acute abdominal pain and dropping hemoglobin, initial encounter EXAM: CT ABDOMEN AND PELVIS WITH CONTRAST TECHNIQUE: Multidetector CT imaging of the abdomen and pelvis was performed using the standard protocol following bolus administration of intravenous contrast. RADIATION DOSE REDUCTION: This exam was performed according to the departmental dose-optimization program which includes automated exposure control, adjustment of the mA and/or kV according to patient size and/or use of iterative reconstruction technique. CONTRAST:  OMNIPAQUE IOHEXOL 300 MG/ML  SOLN COMPARISON:  None Available. FINDINGS: Lower chest: No acute abnormality. Hepatobiliary: No focal liver abnormality is seen. No gallstones, gallbladder wall thickening, or biliary dilatation. Pancreas: Unremarkable. No pancreatic ductal dilatation or surrounding inflammatory changes. Spleen: Normal in size without focal abnormality. Adrenals/Urinary Tract: Adrenal glands are within normal limits. Kidneys demonstrate a normal enhancement pattern bilaterally. No renal calculi or obstructive changes are noted. Normal excretion is noted on delayed images. A bladder is well distended. Stomach/Bowel: Scattered diverticular change of the colon is noted without evidence of diverticulitis. The appendix is within normal limits. Stomach and small bowel are unremarkable.  Vascular/Lymphatic: Aortic atherosclerosis. No enlarged abdominal or pelvic lymph nodes. Reproductive: 4.5 cm exophytic uterine fibroid is noted on the right. No adnexal mass lesion is noted. Other: Small fat containing inguinal hernia is noted on the right. Musculoskeletal: No acute or significant osseous findings. IMPRESSION: Diverticulosis without evidence of diverticulitis. Uterine fibroid on the right. No acute abnormality is noted to correspond given history. Electronically Signed   By: Alcide Clever M.D.   On: 07/24/2022 18:37    IMPRESSION: Iron deficiency anemia  -Will not accept blood products Generalized weakness secondary to above Personal history colon polyps History diverticulosis Paroxysmal atrial fibrillation on Eliquis Diabetes mellitus  PLAN: -Follow up results of video capsule endoscopy, study to be read tomorrow -If no source of GI bleeding found, would recommend formal Hematology evaluation -Ok for diet today -Trend H/H -[facility] will follow, Dr. Dulce Sellar to assume care tomorrow   LOS: 1 day   Liliane Shi, DO [facility]

## 2022-07-25 NOTE — Plan of Care (Signed)

## 2022-07-26 ENCOUNTER — Inpatient Hospital Stay (HOSPITAL_COMMUNITY): Payer: Medicare Other

## 2022-07-26 DIAGNOSIS — D649 Anemia, unspecified: Secondary | ICD-10-CM | POA: Diagnosis not present

## 2022-07-26 LAB — CBC
HCT: 20.5 % — ABNORMAL LOW (ref 36.0–46.0)
Hemoglobin: 5.4 g/dL — CL (ref 12.0–15.0)
MCH: 17.9 pg — ABNORMAL LOW (ref 26.0–34.0)
MCHC: 26.3 g/dL — ABNORMAL LOW (ref 30.0–36.0)
MCV: 67.9 fL — ABNORMAL LOW (ref 80.0–100.0)
Platelets: 361 10*3/uL (ref 150–400)
RBC: 3.02 MIL/uL — ABNORMAL LOW (ref 3.87–5.11)
RDW: 19.7 % — ABNORMAL HIGH (ref 11.5–15.5)
WBC: 6.4 10*3/uL (ref 4.0–10.5)
nRBC: 1.1 % — ABNORMAL HIGH (ref 0.0–0.2)

## 2022-07-26 MED ORDER — DARBEPOETIN ALFA 300 MCG/0.6ML IJ SOSY
300.0000 ug | PREFILLED_SYRINGE | Freq: Once | INTRAMUSCULAR | Status: AC
  Administered 2022-07-26: 300 ug via SUBCUTANEOUS
  Filled 2022-07-26: qty 0.6

## 2022-07-26 MED ORDER — SODIUM CHLORIDE 0.9 % IV SOLN
250.0000 mg | Freq: Every day | INTRAVENOUS | Status: DC
  Administered 2022-07-27 – 2022-07-28 (×2): 250 mg via INTRAVENOUS
  Filled 2022-07-26 (×2): qty 20

## 2022-07-26 MED ORDER — TRAMADOL HCL 50 MG PO TABS
25.0000 mg | ORAL_TABLET | Freq: Once | ORAL | Status: AC
  Administered 2022-07-26: 25 mg via ORAL
  Filled 2022-07-26: qty 1

## 2022-07-26 NOTE — Progress Notes (Signed)
Date and time results received: 07/26/22 0624 (use smartphrase ".now" to insert current time)  Test: Hgb-5.4 Critical Value:   Name of Provider Notified: Chinita Greenland NP  Orders Received? Or Actions Taken?:

## 2022-07-26 NOTE — Consult Note (Signed)
[facility] CONSULT NOTE  Patient Care Team: Tysinger, Kermit Balo, PA-C as PCP - General (Family Medicine) Jens Som Madolyn Frieze, MD as PCP - Cardiology (Cardiology) Mealor, Roberts Gaudy, MD as PCP - Electrophysiology (Cardiology) Verner Chol, [facility] (Inactive) as Pharmacist (Pharmacist)  CHIEF COMPLAINTS/PURPOSE OF CONSULTATION:  Severe anemia  HISTORY OF PRESENTING ILLNESS:  Sandra Ruiz 77 y.o. female is here because of new onset of severe anemia.  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  For the past 3 months she has been experiencing worsening fatigue and shortness of breath to minimal exertion and was found to have a hemoglobin of 5.8 g.  Initial workup revealed microcytic anemia and iron studies revealed iron deficiency.  B12 193 and folic acid 8.3 and reticulocytes 32.5% immature reticulocyte fraction and reticulocyte percent: 1.9% reticulocyte absolute 62.9  I reviewed her records extensively and collaborated the history with the patient.   MEDICAL HISTORY:  Past Medical History:  Diagnosis Date   Chronic pain    Depression    Diabetes mellitus without complication (HCC)    GERD (gastroesophageal reflux disease)    History of TIA (transient ischemic attack)    Hyperlipidemia    Hyperopia of both eyes with astigmatism and presbyopia 12/21/2019   Hypertension    Lyme disease    Paroxysmal atrial fibrillation (HCC)    Sleep apnea    Thyroid disease     SURGICAL HISTORY: Past Surgical History:  Procedure Laterality Date   ATRIAL FIBRILLATION ABLATION     x2 in [place]   CARDIAC CATHETERIZATION     CARDIOVERSION N/A 03/05/2021   Procedure: CARDIOVERSION;  Surgeon: Meriam Sprague, MD;  Location: [facility] ENDOSCOPY;  Service: Cardiovascular;  Laterality: N/A;   CERVICAL SPINE SURGERY     implantable loop recorder placement  11/07/2017   MDT LINQ1 implanted in [place] for afib  management by Dr Thedore Mins   LOOP RECORDER INSERTION N/A 11/06/2021   Procedure: LOOP RECORDER INSERTION;  Surgeon: Maurice Small, MD;  Location: [facility] INVASIVE CV LAB;  Service: Cardiovascular;  Laterality: N/A;   LOOP RECORDER REMOVAL N/A 11/06/2021   Procedure: LOOP RECORDER REMOVAL;  Surgeon: Maurice Small, MD;  Location: [facility] INVASIVE CV LAB;  Service: Cardiovascular;  Laterality: N/A;   LUMBAR SPINE SURGERY     TONSILLECTOMY AND ADENOIDECTOMY      SOCIAL HISTORY: Social History   Socioeconomic History   Marital status: Married    Spouse name: Not on file   Number of children: 5   Years of education: Not on file   Highest education level: Some college, no degree  Occupational History   Not on file  Tobacco Use   Smoking status: Former   Smokeless tobacco: Never   Tobacco comments:    Former smoker 11/12/2020  Vaping Use   Vaping status: Never Used  Substance and Sexual Activity   Alcohol use: Yes    Alcohol/week: 2.0 standard drinks of alcohol    Types: 2 Standard drinks or equivalent per week    Comment: mix drinks occ.   Drug use: Never   Sexual activity: Not on file  Other Topics Concern   Not on file  Social History Narrative   From [address], married, [place] Witness, new from [place] to [place]  07/2019   Right handed   Some caffeine use    Lives with husband, daughter, and  son-in-law   Social Determinants of Health   Financial Resource Strain: Low Risk  (10/30/2021)   Overall Financial Resource Strain (CARDIA)    Difficulty of Paying Living Expenses: Not hard at all  Food Insecurity: No Food Insecurity (07/23/2022)   Hunger Vital Sign    Worried About Running Out of Food in the Last Year: Never true    Ran Out of Food in the Last Year: Never true  Transportation Needs: No Transportation Needs (07/23/2022)   PRAPARE - Administrator, Civil Service (Medical): No    Lack of Transportation (Non-Medical): No  Physical Activity:  Inactive (10/30/2021)   Exercise Vital Sign    Days of Exercise per Week: 0 days    Minutes of Exercise per Session: 0 min  Stress: Stress Concern Present (10/30/2021)   [employer] of Occupational Health - Occupational Stress Questionnaire    Feeling of Stress : To some extent  Social Connections: Not on file  Intimate Partner Violence: Not At Risk (07/23/2022)   Humiliation, Afraid, Rape, and Kick questionnaire    Fear of Current or Ex-Partner: No    Emotionally Abused: No    Physically Abused: No    Sexually Abused: No    FAMILY HISTORY: Family History  Problem Relation Age of Onset   CAD Mother    Thyroid disease Mother    CAD Father    Thyroid disease Daughter    Hypothyroidism Daughter     ALLERGIES:  is allergic to atorvastatin, baclofen, crestor [rosuvastatin], metformin and related, verapamil, silicone, and wellbutrin [bupropion].  MEDICATIONS:  Current Facility-Administered Medications  Medication Dose Route Frequency Provider Last Rate Last Admin   acetaminophen (TYLENOL) tablet 650 mg  650 mg Oral Q6H PRN Leatha Gilding, MD   650 mg at 07/26/22 1300   Or   acetaminophen (TYLENOL) suppository 650 mg  650 mg Rectal Q6H PRN Leatha Gilding, MD       ALPRAZolam Prudy Feeler) tablet 0.25 mg  0.25 mg Oral BID PRN Leatha Gilding, MD       bisacodyl (DULCOLAX) EC tablet 5 mg  5 mg Oral Daily PRN Leatha Gilding, MD   5 mg at 07/23/22 2208   cyanocobalamin (VITAMIN B12) injection 1,000 mcg  1,000 mcg Intramuscular Daily Leatha Gilding, MD   1,000 mcg at 07/26/22 1016   diltiazem (CARDIZEM CD) 24 hr capsule 240 mg  240 mg Oral Daily Pamella Pert M, MD   240 mg at 07/26/22 1014   diphenhydrAMINE (BENADRYL) tablet 25 mg  25 mg Oral Q6H PRN Leatha Gilding, MD       dronedarone (MULTAQ) tablet 400 mg  400 mg Oral BID WC Leatha Gilding, MD   400 mg at 07/26/22 0809   DULoxetine (CYMBALTA) DR capsule 60 mg  60 mg Oral BID Leatha Gilding, MD   60 mg at  07/26/22 1015   ezetimibe (ZETIA) tablet 10 mg  10 mg Oral Daily Leatha Gilding, MD   10 mg at 07/26/22 1015   folic acid (FOLVITE) tablet 1 mg  1 mg Oral Daily Leatha Gilding, MD   1 mg at 07/26/22 1015   gabapentin (NEURONTIN) capsule 800 mg  800 mg Oral TID Leatha Gilding, MD   800 mg at 07/26/22 1015   levothyroxine (SYNTHROID) tablet 75 mcg  75 mcg Oral Q0600 Leatha Gilding, MD   75 mcg at 07/26/22 0524   ondansetron (ZOFRAN) tablet 4 mg  4 mg Oral Q6H PRN Leatha Gilding, MD       Or   ondansetron (ZOFRAN) injection 4 mg  4 mg Intravenous Q6H PRN Leatha Gilding, MD       oxybutynin (DITROPAN-XL) 24 hr tablet 10 mg  10 mg Oral BID Pamella Pert M, MD   10 mg at 07/26/22 1015   pantoprazole (PROTONIX) EC tablet 40 mg  40 mg Oral BID AC Leatha Gilding, MD   40 mg at 07/26/22 0809   polyvinyl alcohol (LIQUIFILM TEARS) 1.4 % ophthalmic solution 1 drop  1 drop Both Eyes TID PRN Leatha Gilding, MD       pravastatin (PRAVACHOL) tablet 40 mg  40 mg Oral QHS Leatha Gilding, MD   40 mg at 07/25/22 2144    REVIEW OF SYSTEMS:   Constitutional: Denies fevers, chills or abnormal night sweats All other systems were reviewed with the patient and are negative.  PHYSICAL EXAMINATION: ECOG PERFORMANCE STATUS: 1 - Symptomatic but completely ambulatory  Vitals:   07/26/22 0628 07/26/22 1325  BP: 135/67 (!) 149/67  Pulse: 76 83  Resp: 17 18  Temp: 98.1 F (36.7 C) 97.9 F (36.6 C)  SpO2: 97% 98%   Filed Weights   07/23/22 1441 07/25/22 0941  Weight: 152 lb 6.4 oz (69.1 kg) 152 lb 5.4 oz (69.1 kg)    GENERAL:alert, no distress and comfortable  LABORATORY DATA:  I have reviewed the data as listed Lab Results  Component Value Date   WBC 6.4 07/26/2022   HGB 5.4 (LL) 07/26/2022   HCT 20.5 (L) 07/26/2022   MCV 67.9 (L) 07/26/2022   PLT 361 07/26/2022   Lab Results  Component Value Date   NA 135 07/24/2022   K 4.5 07/24/2022   CL 105 07/24/2022   CO2 25  07/24/2022    RADIOGRAPHIC STUDIES: I have personally reviewed the radiological reports and agreed with the findings in the report.  ASSESSMENT AND PLAN:  Severe anemia: Jehov[employer]'s Witness: Microcytic iron deficiency anemia: Receiving IV iron infusions and 1 dose of Aranesp 65 mcg.  B12 injections have also been given. Recommendation: Administer 4 more doses of IV iron, administer Aranesp 300 mcg injection With low reticulocyte count there is no evidence of hemolysis No other recommendations at this time. Once hemoglobin crosses 7 g patient may be discharged home with outpatient follow-up to continue with supportive care and treatments for anemia.  If in a month the hemoglobin does not get better then we can consider doing a bone marrow biopsy.   All questions were answered. The patient knows to call the clinic with any problems, questions or concerns.    Tamsen Meek, MD @T @

## 2022-07-26 NOTE — Plan of Care (Signed)
  Problem: Education: Goal: Knowledge of General Education information will improve Description: Including pain rating scale, medication(s)/side effects and non-pharmacologic comfort measures Outcome: Progressing   Problem: Clinical Measurements: Goal: Ability to maintain clinical measurements within normal limits will improve Outcome: Progressing   Problem: Clinical Measurements: Goal: Will remain free from infection Outcome: Progressing   Problem: Nutrition: Goal: Adequate nutrition will be maintained Outcome: Progressing   

## 2022-07-26 NOTE — Progress Notes (Signed)
BRIEF PROGRESS NOTE:   Video capsule study completed and read today. Does not appear that video capsule left stomach during course of exam. There were signs of bile acid reflux into the stomach, though no active signs of GI bleeding. Recommend abdominal xray 1 view to evaluate for retained foreign body. Recommend Hematology evaluation. Discussed with [facility] GI hospital coverage, Dr. Dulce Sellar.  Liliane Shi, DO [facility]

## 2022-07-26 NOTE — Progress Notes (Signed)
PROGRESS NOTE  Sandra Ruiz EXB:[id number] DOB: 10/17/1945 DOA: 07/23/2022 PCP: Jac Canavan, PA-C   LOS: 2 days   Brief Narrative / Interim history: 77 year old female with PAF on Eliquis, DM2, hypothyroidism, prior TIA comes into the hospital with generalized weakness for the past few months.  She also reports intermittent melena about 1 episode per month.  She was found to have a hemoglobin of 5.8 with no clear source of blood loss currently.  She was admitted to the hospital.  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   Active problems PAF - in sinus now, continue home medications except anticoagulation   HLD -continue Home meds   Hypokalemia -potassium was replaced   Hyponatremia - mild, possbly dehydrated. Avoid IVF as it may dilute the Hb more, encouraged po intake   Hypothyroidism - resume synthroid   History of PUD, melena- placed on BID PPI.  GI consulted, undergoing a capsule study to evaluate for occult blood loss  Scheduled Meds:  cyanocobalamin  1,000 mcg Intramuscular Daily   diltiazem  240 mg Oral Daily   dronedarone  400 mg Oral BID WC   DULoxetine  60 mg Oral BID   ezetimibe  10 mg Oral Daily   folic acid  1 mg Oral Daily   gabapentin  800 mg Oral TID   levothyroxine  75 mcg Oral Q0600   oxybutynin  10 mg Oral BID   pantoprazole  40 mg Oral BID AC   pravastatin   40 mg Oral QHS   Continuous Infusions:  ferric gluconate (FERRLECIT) IVPB 250 mg (07/25/22 1155)   PRN Meds:.acetaminophen **OR** acetaminophen, ALPRAZolam, bisacodyl, diphenhydrAMINE, ondansetron **OR** ondansetron (ZOFRAN) IV, polyvinyl alcohol  Current Outpatient Medications  Medication Instructions   ACCU-CHEK GUIDE test strip TEST 1 - 2 TIMES DAILY   Accu-Chek Softclix Lancets lancets Use as instructed   ALPRAZolam (XANAX) 0.25 mg, Oral, 2 times daily PRN   apixaban (ELIQUIS) 5 mg, Oral, 2 times daily   Artificial Tears ophthalmic solution 1 drop, Both Eyes, 3 times daily PRN   Benadryl Allergy 25 mg, Oral, Every 6 hours PRN   Blood Glucose Monitoring Suppl (ACCU-CHEK GUIDE ME) w/Device KIT Test 1-2 times daily   diltiazem (CARDIZEM) 30 MG tablet TAKE 1 TABLET BY MOUTH EVERY 4 HOURS AS NEEDED FOR AFIB HEART RATE >100 AS LONG AS TOP BP >100   diltiazem (DILT-XR) 240 MG 24 hr capsule TAKE 1 CAPSULE BY MOUTH DAILY   dronedarone (MULTAQ) 400 mg, Oral, 2 times daily with meals   DULoxetine (CYMBALTA) 60 mg, Oral, 2 times daily   ezetimibe (ZETIA) 10 MG tablet TAKE 1 TABLET BY MOUTH DAILY   furosemide (LASIX) 20 MG tablet TAKE 0.5 TABLET BY MOUTH DAILY AS NEEDED SWELLING OR WEIGHT GAIN   gabapentin (NEURONTIN) 800 mg, Oral, 3 times daily   levothyroxine (SYNTHROID) 75 MCG tablet TAKE 1 TABLET BY MOUTH DAILY  BEFORE BREAKFAST   Mounjaro 10 mg, Subcutaneous, Every Wed   nitroGLYCERIN (NITROSTAT) 0.4 MG SL tablet DISSOLVE 1 TABLET UNDER THE  TONGUE EVERY 5 MINUTES AS NEEDED FOR CHEST PAIN. MAX OF 3 TABLETS IN 15 MINUTES. CALL 911 IF PAIN  PERSISTS.   omeprazole (PRILOSEC) 20 MG capsule TAKE 1 CAPSULE BY MOUTH DAILY   oxybutynin (DITROPAN-XL) 10 MG 24 hr tablet TAKE 1 TABLET BY MOUTH TWICE  DAILY   pravastatin (PRAVACHOL) 40 mg, Oral, Every evening   TYLENOL 500-1,000 mg, Oral, Every 6 hours PRN    Diet Orders (From admission, onward)     Start     Ordered   07/25/22 1400  Diet regular  Room service appropriate? Yes; Fluid consistency: Thin  Diet effective 1400       Question Answer Comment  Room service appropriate? Yes   Fluid consistency: Thin      07/25/22 1324            DVT prophylaxis: SCDs Start: 07/23/22 1430   Lab Results  Component Value Date   PLT 361 07/26/2022      Code Status: Full Code  Family Communication: no family at bedside   Status is: Inpatient   Level of care: Telemetry  Consultants:  GI  Objective: Vitals:   07/25/22 0941 07/25/22 1259 07/25/22 2125 07/26/22 0628  BP:  (!) 123/55 122/65 135/67  Pulse:  84 85 76  Resp:  16 18 17   Temp:  98.1 F (36.7 C) 98.2 F (36.8 C) 98.1 F (36.7 C)  TempSrc:  Oral Oral Oral  SpO2:  96% 95% 97%  Weight: 69.1 kg     Height: 5\' 1"  (1.549 m)       Intake/Output Summary (Last 24 hours) at 07/26/2022 1018 Last data filed at 07/26/2022 1610 Gross per 24 hour  Intake 240 ml  Output 800 ml  Net -560 ml   Wt Readings from Last 3 Encounters:  07/25/22 69.1 kg  07/22/22 69.1 kg  04/29/22 73.3 kg    Examination:  Constitutional: NAD Eyes: lids and conjunctivae normal, no scleral icterus ENMT: mmm Neck: normal, supple Respiratory: clear to auscultation bilaterally, no wheezing, no crackles. Normal respiratory effort.  Cardiovascular: Regular rate and rhythm, no murmurs / rubs / gallops. No LE edema. Abdomen: soft, no distention, no tenderness. Bowel sounds positive.   Data Reviewed: I have independently reviewed following labs and imaging studies   CBC Recent Labs  Lab 07/22/22 0905 07/23/22 1018 07/24/22 0603 07/25/22 0611 07/26/22 0602  WBC 6.8 5.6 5.5 6.3 6.4  HGB 6.4* 5.8* 5.3* 5.6* 5.4*  HCT 24.9* 22.0* 20.3* 21.6* 20.5*  PLT 491* 401* 337 361 361  MCV 65* 66.3* 66.6* 67.5* 67.9*  MCH 16.6* 17.5* 17.4* 17.5* 17.9*  MCHC 25.7* 26.4* 26.1* 25.9* 26.3*  RDW 18.1* 18.9* 19.0* 19.3* 19.7*  LYMPHSABS  --   --  1.0  --   --   MONOABS  --   --  0.5  --   --    EOSABS  --   --  0.2  --   --   BASOSABS  --   --  0.0  --   --     Recent Labs  Lab 07/22/22 0905 07/23/22 1018 07/24/22 0603  NA 137 133* 135  K 4.1 3.2* 4.5  CL 100 101 105  CO2 23 25 25   GLUCOSE 94 107* 91  BUN 13 15 11   CREATININE 0.80 0.76 0.62  CALCIUM  9.6 8.6* 8.3*  AST  --  24  --   ALT  --  18  --   ALKPHOS  --  78  --   BILITOT  --  0.4  --   ALBUMIN  --  3.5  --     ------------------------------------------------------------------------------------------------------------------ No results for input(s): "CHOL", "HDL", "LDLCALC", "TRIG", "CHOLHDL", "LDLDIRECT" in the last 72 hours.  Lab Results  Component Value Date   HGBA1C 6.3 (A) 04/28/2022   ------------------------------------------------------------------------------------------------------------------ No results for input(s): "TSH", "T4TOTAL", "T3FREE", "THYROIDAB" in the last 72 hours.  Invalid input(s): "FREET3"  Cardiac Enzymes No results for input(s): "CKMB", "TROPONINI", "MYOGLOBIN" in the last 168 hours.  Invalid input(s): "CK" ------------------------------------------------------------------------------------------------------------------    Component Value Date/Time   BNP 82.4 11/12/2020 1005    CBG: Recent Labs  Lab 07/23/22 0910  GLUCAP 149*    No results found for this or any previous visit (from the past 240 hour(s)).   Radiology Studies: No results found.   Pamella Pert, MD, PhD [facility]  Between 7 am - 7 pm I am available, please contact me via Amion (for emergencies) or Securechat (non urgent messages)  Between 7 pm - 7 am I am not available, please contact night coverage MD/APP via Amion

## 2022-07-26 NOTE — Progress Notes (Signed)
       Overnight   NAME: Sandra Ruiz MRN: [id number] DOB : [birth year]    Date of Service   07/26/2022   HPI/Events of Note    Notified by RN for    Latest Reference Range & Units 07/26/22 06:02  WBC 4.0 - 10.5 K/uL 6.4  RBC 3.87 - 5.11 MIL/uL 3.02 (L)  Hemoglobin 12.0 - 15.0 g/dL 5.4 (LL)  HCT 78.2 - 95.6 % 20.5 (L)  MCV 80.0 - 100.0 fL 67.9 (L)  MCH 26.0 - 34.0 pg 17.9 (L)  MCHC 30.0 - 36.0 g/dL 21.3 (L)  RDW 08.6 - 57.8 % 19.7 (H)  Platelets 150 - 400 K/uL 361  nRBC 0.0 - 0.2 % 1.1 (H)  (LL): Data is critically low (L): Data is abnormally low (H): Data is abnormally high  will not transfuse as per her noted/expressed wishes.    Interventions/ Plan   Continue previous orders      Chinita Greenland BSN MSNA MSN ACNPC-AG Acute Care Nurse Practitioner [facility]

## 2022-07-27 ENCOUNTER — Encounter (HOSPITAL_COMMUNITY): Payer: Self-pay | Admitting: Internal Medicine

## 2022-07-27 ENCOUNTER — Inpatient Hospital Stay (HOSPITAL_COMMUNITY): Payer: Medicare Other

## 2022-07-27 DIAGNOSIS — D649 Anemia, unspecified: Secondary | ICD-10-CM | POA: Diagnosis not present

## 2022-07-27 LAB — CBC
HCT: 21 % — ABNORMAL LOW (ref 36.0–46.0)
Hemoglobin: 5.4 g/dL — CL (ref 12.0–15.0)
MCH: 18.1 pg — ABNORMAL LOW (ref 26.0–34.0)
MCHC: 25.7 g/dL — ABNORMAL LOW (ref 30.0–36.0)
MCV: 70.5 fL — ABNORMAL LOW (ref 80.0–100.0)
Platelets: 324 10*3/uL (ref 150–400)
RBC: 2.98 MIL/uL — ABNORMAL LOW (ref 3.87–5.11)
RDW: 19.9 % — ABNORMAL HIGH (ref 11.5–15.5)
WBC: 7.1 10*3/uL (ref 4.0–10.5)
nRBC: 1.1 % — ABNORMAL HIGH (ref 0.0–0.2)

## 2022-07-27 MED ORDER — MAGNESIUM OXIDE -MG SUPPLEMENT 400 (240 MG) MG PO TABS
200.0000 mg | ORAL_TABLET | Freq: Two times a day (BID) | ORAL | Status: DC
  Administered 2022-07-27 – 2022-07-28 (×3): 200 mg via ORAL
  Filled 2022-07-27 (×3): qty 1

## 2022-07-27 MED ORDER — POLYETHYLENE GLYCOL 3350 17 G PO PACK
17.0000 g | PACK | Freq: Every day | ORAL | Status: DC
  Administered 2022-07-27 – 2022-07-28 (×2): 17 g via ORAL
  Filled 2022-07-27 (×2): qty 1

## 2022-07-27 MED ORDER — SENNOSIDES-DOCUSATE SODIUM 8.6-50 MG PO TABS
1.0000 | ORAL_TABLET | Freq: Two times a day (BID) | ORAL | Status: DC
  Administered 2022-07-27 – 2022-07-28 (×3): 1 via ORAL
  Filled 2022-07-27 (×3): qty 1

## 2022-07-27 NOTE — Progress Notes (Signed)
S: Patient has been able to walk without feeling dizzy or lightheaded.  She is fairly fatigued. O:     07/27/2022    1:41 PM 07/27/2022    5:16 AM 07/26/2022    9:08 PM  Vitals with BMI  Systolic 126 114 606  Diastolic 59 75 62  Pulse 79 77 80      Latest Ref Rng & Units 07/27/2022    6:27 AM 07/26/2022    6:02 AM 07/25/2022    6:11 AM  CBC  WBC 4.0 - 10.5 K/uL 7.1  6.4  6.3   Hemoglobin 12.0 - 15.0 g/dL 5.4  5.4  5.6   Hematocrit 36.0 - 46.0 % 21.0  20.5  21.6   Platelets 150 - 400 K/uL 324  361  361       Latest Ref Rng & Units 07/24/2022    6:03 AM 07/23/2022   10:18 AM 07/22/2022    9:05 AM  CMP  Glucose 70 - 99 mg/dL 91  301  94   BUN 8 - 23 mg/dL 11  15  13    Creatinine 0.44 - 1.00 mg/dL 6.01  0.93  2.35   Sodium 135 - 145 mmol/L 135  133  137   Potassium 3.5 - 5.1 mmol/L 4.5  3.2  4.1   Chloride 98 - 111 mmol/L 105  101  100   CO2 22 - 32 mmol/L 25  25  23    Calcium 8.9 - 10.3 mg/dL 8.3  8.6  9.6   Total Protein 6.5 - 8.1 g/dL  6.5    Total Bilirubin 0.3 - 1.2 mg/dL  0.4    Alkaline Phos 38 - 126 U/L  78    AST 15 - 41 U/L  24    ALT 0 - 44 U/L  18     Assessment and plan: Severe anemia: We are continuing to administer IV iron as well as given a dose of 300 mcg of Aranesp.  Patient is getting anxious that she is not improving. I discussed with the patient that we to stay the course and see over the next 48 hours how her numbers improved. Even if they are coming up to be about 6.5 or closer to 7 and she could be discharged for follow-up in the cancer center and we can continue with the erythropoietin stimulating agent therapy.

## 2022-07-27 NOTE — Progress Notes (Signed)
   07/27/22 1538  TOC Brief Assessment  Insurance and Status Reviewed  Patient has primary care physician Yes  Home environment has been reviewed yes  Prior level of function: Independent  Prior/Current Home Services No current home services  Social Determinants of Health Reivew SDOH reviewed no interventions necessary  Readmission risk has been reviewed Yes  Transition of care needs no transition of care needs at this time

## 2022-07-27 NOTE — Progress Notes (Signed)
PROGRESS NOTE  Sandra Ruiz XBJ:[id number] DOB: 1945-01-25 DOA: 07/23/2022 PCP: Jac Canavan, PA-C   LOS: 3 days   Brief Narrative / Interim history: 77 year old female with PAF on Eliquis, DM2, hypothyroidism, prior TIA comes into the hospital with generalized weakness for the past few months.  She also reports intermittent melena about 1 episode per month.  She was found to have a hemoglobin of 5.8 with no clear source of blood loss currently.  She was admitted to the hospital.  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  He is able to ambulate slowly in the room  Assesement and Plan: Principal Problem:   Symptomatic anemia  Principal problem Symptomatic anemia -she has both iron and B12 deficiency, although B12 appears as "normal", it is on the very low end of normal -Hemoglobin about the same as yesterday, remains at 5.4 this morning. -will not transfuse as per whishes -Received IV iron x 4, folic acid, B12 I'm x 5, status post EPO.  Hematology has been consulted, will administer more IV iron and had repeat Aranesp injection yesterday. -Appreciate G input as well   Active problems History of PUD, melena- placed on BID PPI.  Gastroenterology consulted, patient underwent a capsule study yesterday however it appears that the camera got stuck in the stomach and is not advancing.  Repeat imaging this morning shows same location for the capsule. ? GOO --defer to gastroenterology  PAF - in sinus now, continue home medications except anticoagulation   HLD -continue Home meds   Hypokalemia -potassium was replaced.  Minimize blood draws but would recheck tomorrow since it has been 4 days since last checked   Hyponatremia - mild, possbly dehydrated. Avoid IVF as it may dilute the Hb more, encouraged po intake   Hypothyroidism - resume synthroid     Scheduled  Meds:  cyanocobalamin  1,000 mcg Intramuscular Daily   diltiazem  240 mg Oral Daily   dronedarone  400 mg Oral BID WC   DULoxetine  60 mg Oral BID   ezetimibe  10 mg Oral Daily   folic acid  1 mg Oral Daily   gabapentin  800 mg Oral TID   levothyroxine  75 mcg Oral Q0600   magnesium oxide  200 mg Oral BID   oxybutynin  10 mg Oral BID   pantoprazole  40 mg Oral BID AC   polyethylene glycol  17 g Oral Daily   pravastatin  40 mg Oral QHS   senna-docusate  1 tablet Oral BID   Continuous Infusions:  ferric gluconate (FERRLECIT) IVPB     PRN Meds:.acetaminophen **OR** acetaminophen, ALPRAZolam, bisacodyl, diphenhydrAMINE, ondansetron **OR** ondansetron (ZOFRAN) IV, polyvinyl alcohol  Current Outpatient Medications  Medication Instructions   ACCU-CHEK GUIDE test strip TEST 1 - 2 TIMES DAILY   Accu-Chek Softclix Lancets lancets Use as instructed   ALPRAZolam (XANAX) 0.25 mg, Oral, 2 times daily PRN   apixaban (ELIQUIS) 5 mg, Oral, 2 times daily   Artificial Tears ophthalmic solution 1 drop, Both Eyes, 3 times daily PRN   Benadryl Allergy 25 mg, Oral, Every 6 hours PRN   Blood Glucose Monitoring Suppl (ACCU-CHEK GUIDE ME) w/Device KIT Test 1-2 times daily   diltiazem (CARDIZEM) 30 MG tablet TAKE 1 TABLET BY MOUTH EVERY 4 HOURS AS NEEDED FOR AFIB HEART RATE >100 AS LONG AS TOP BP >100  diltiazem (DILT-XR) 240 MG 24 hr capsule TAKE 1 CAPSULE BY MOUTH DAILY   dronedarone (MULTAQ) 400 mg, Oral, 2 times daily with meals   DULoxetine (CYMBALTA) 60 mg, Oral, 2 times daily   ezetimibe (ZETIA) 10 MG tablet TAKE 1 TABLET BY MOUTH DAILY   furosemide (LASIX) 20 MG tablet TAKE 0.5 TABLET BY MOUTH DAILY AS NEEDED SWELLING OR WEIGHT GAIN   gabapentin (NEURONTIN) 800 mg, Oral, 3 times daily   levothyroxine (SYNTHROID) 75 MCG tablet TAKE 1 TABLET BY MOUTH DAILY  BEFORE BREAKFAST   Mounjaro 10 mg, Subcutaneous, Every Wed   nitroGLYCERIN (NITROSTAT) 0.4 MG SL tablet DISSOLVE 1 TABLET UNDER THE  TONGUE  EVERY 5 MINUTES AS NEEDED FOR CHEST PAIN. MAX OF 3 TABLETS IN 15 MINUTES. CALL 911 IF PAIN  PERSISTS.   omeprazole (PRILOSEC) 20 MG capsule TAKE 1 CAPSULE BY MOUTH DAILY   oxybutynin (DITROPAN-XL) 10 MG 24 hr tablet TAKE 1 TABLET BY MOUTH TWICE  DAILY   pravastatin (PRAVACHOL) 40 mg, Oral, Every evening   TYLENOL 500-1,000 mg, Oral, Every 6 hours PRN    Diet Orders (From admission, onward)     Start     Ordered   07/25/22 1400  Diet regular Room service appropriate? Yes; Fluid consistency: Thin  Diet effective 1400       Question Answer Comment  Room service appropriate? Yes   Fluid consistency: Thin      07/25/22 1324            DVT prophylaxis: SCDs Start: 07/23/22 1430   Lab Results  Component Value Date   PLT 324 07/27/2022      Code Status: Full Code  Family Communication: no family at bedside   Status is: Inpatient   Level of care: Telemetry  Consultants:  GI  Objective: Vitals:   07/26/22 0628 07/26/22 1325 07/26/22 2108 07/27/22 0516  BP: 135/67 (!) 149/67 107/62 114/75  Pulse: 76 83 80 77  Resp: 17 18 14 14   Temp: 98.1 F (36.7 C) 97.9 F (36.6 C) 98.6 F (37 C) 98.6 F (37 C)  TempSrc: Oral Oral Oral Oral  SpO2: 97% 98% 97% 93%  Weight:      Height:        Intake/Output Summary (Last 24 hours) at 07/27/2022 0954 Last data filed at 07/27/2022 0919 Gross per 24 hour  Intake 360 ml  Output --  Net 360 ml   Wt Readings from Last 3 Encounters:  07/25/22 69.1 kg  07/22/22 69.1 kg  04/29/22 73.3 kg    Examination:  Constitutional: NAD Eyes: lids and conjunctivae normal, no scleral icterus ENMT: mmm Neck: normal, supple Respiratory: clear to auscultation bilaterally, no wheezing, no crackles. Normal respiratory effort.  Cardiovascular: Regular rate and rhythm, no murmurs / rubs / gallops. No LE edema. Abdomen: soft, no distention, no tenderness. Bowel sounds positive.  Skin: no rashes  Data Reviewed: I have independently reviewed  following labs and imaging studies   CBC Recent Labs  Lab 07/23/22 1018 07/24/22 0603 07/25/22 0611 07/26/22 0602 07/27/22 0627  WBC 5.6 5.5 6.3 6.4 7.1  HGB 5.8* 5.3* 5.6* 5.4* 5.4*  HCT 22.0* 20.3* 21.6* 20.5* 21.0*  PLT 401* 337 361 361 324  MCV 66.3* 66.6* 67.5* 67.9* 70.5*  MCH 17.5* 17.4* 17.5* 17.9* 18.1*  MCHC 26.4* 26.1* 25.9* 26.3* 25.7*  RDW 18.9* 19.0* 19.3* 19.7* 19.9*  LYMPHSABS  --  1.0  --   --   --   MONOABS  --  0.5  --   --   --   EOSABS  --  0.2  --   --   --   BASOSABS  --  0.0  --   --   --     Recent Labs  Lab 07/22/22 0905 07/23/22 1018 07/24/22 0603  NA 137 133* 135  K 4.1 3.2* 4.5  CL 100 101 105  CO2 23 25 25   GLUCOSE 94 107* 91  BUN 13 15 11   CREATININE 0.80 0.76 0.62  CALCIUM 9.6 8.6* 8.3*  AST  --  24  --   ALT  --  18  --   ALKPHOS  --  78  --   BILITOT  --  0.4  --   ALBUMIN  --  3.5  --     ------------------------------------------------------------------------------------------------------------------ No results for input(s): "CHOL", "HDL", "LDLCALC", "TRIG", "CHOLHDL", "LDLDIRECT" in the last 72 hours.  Lab Results  Component Value Date   HGBA1C 6.3 (A) 04/28/2022   ------------------------------------------------------------------------------------------------------------------ No results for input(s): "TSH", "T4TOTAL", "T3FREE", "THYROIDAB" in the last 72 hours.  Invalid input(s): "FREET3"  Cardiac Enzymes No results for input(s): "CKMB", "TROPONINI", "MYOGLOBIN" in the last 168 hours.  Invalid input(s): "CK" ------------------------------------------------------------------------------------------------------------------    Component Value Date/Time   BNP 82.4 11/12/2020 1005    CBG: Recent Labs  Lab 07/23/22 0910  GLUCAP 149*    No results found for this or any previous visit (from the past 240 hour(s)).   Radiology Studies: DG Abd 1 View  Result Date: 07/26/2022 CLINICAL DATA:  [id number] Retained  foreign body [id number] EXAM: ABDOMEN - 1 VIEW COMPARISON:  CT 07/24/2022 FINDINGS: Endoscopic capsule projects within the gastric body. The bowel gas pattern is normal. No radio-opaque calculi or other significant radiographic abnormality are seen. IMPRESSION: Endoscopic capsule projects within the gastric body. Electronically Signed   By: Duanne Guess D.O.   On: 07/26/2022 16:46     Pamella Pert, MD, PhD [facility]  Between 7 am - 7 pm I am available, please contact me via Amion (for emergencies) or Securechat (non urgent messages)  Between 7 pm - 7 am I am not available, please contact night coverage MD/APP via Amion

## 2022-07-27 NOTE — Progress Notes (Signed)
Subjective: No complaints. No abdominal pain or hematemesis or melena or hematochezia.  Objective: Vital signs in last 24 hours: Temp:  [97.9 F (36.6 C)-98.6 F (37 C)] 98.6 F (37 C) (07/23 0516) Pulse Rate:  [77-83] 77 (07/23 0516) Resp:  [14-18] 14 (07/23 0516) BP: (107-149)/(62-75) 114/75 (07/23 0516) SpO2:  [93 %-98 %] 93 % (07/23 0516) Weight change:  Last BM Date : 07/26/22  PE: GEN:  NAD ABD: Soft, protuberant, non-tender  Lab Results: CBC    Component Value Date/Time   WBC 7.1 07/27/2022 0627   RBC 2.98 (L) 07/27/2022 0627   HGB 5.4 (LL) 07/27/2022 0627   HGB 6.4 (LL) 07/22/2022 0905   HCT 21.0 (L) 07/27/2022 0627   HCT 24.9 (L) 07/22/2022 0905   PLT 324 07/27/2022 0627   PLT 491 (H) 07/22/2022 0905   MCV 70.5 (L) 07/27/2022 0627   MCV 65 (L) 07/22/2022 0905   MCH 18.1 (L) 07/27/2022 0627   MCHC 25.7 (L) 07/27/2022 0627   RDW 19.9 (H) 07/27/2022 0627   RDW 18.1 (H) 07/22/2022 0905   LYMPHSABS 1.0 07/24/2022 0603   LYMPHSABS 1.6 08/26/2020 1130   MONOABS 0.5 07/24/2022 0603   EOSABS 0.2 07/24/2022 0603   EOSABS 0.1 08/26/2020 1130   BASOSABS 0.0 07/24/2022 0603   BASOSABS 0.1 08/26/2020 1130  CMP     Component Value Date/Time   NA 135 07/24/2022 0603   NA 137 07/22/2022 0905   K 4.5 07/24/2022 0603   CL 105 07/24/2022 0603   CO2 25 07/24/2022 0603   GLUCOSE 91 07/24/2022 0603   BUN 11 07/24/2022 0603   BUN 13 07/22/2022 0905   CREATININE 0.62 07/24/2022 0603   CALCIUM 8.3 (L) 07/24/2022 0603   PROT 6.5 07/23/2022 1018   PROT 5.6 (L) 07/02/2021 0843   ALBUMIN 3.5 07/23/2022 1018   ALBUMIN 3.8 07/02/2021 0843   AST 24 07/23/2022 1018   ALT 18 07/23/2022 1018   ALKPHOS 78 07/23/2022 1018   BILITOT 0.4 07/23/2022 1018   BILITOT 0.3 07/02/2021 0843   EGFR 77 07/22/2022 0905   GFRNONAA >60 07/24/2022 0603   Assessment:   Anemia with iron deficiency.  Unrevealing endoscopy and colonoscopy January 2023. Jehovah's witness; will not accept  blood products. Atrial fibrillation. Anticoagulation, apixaban, on hold.  Plan:   Capsule is either in stomach or proximal small bowel, based on my interpretation of her xray. I do not think she has gastric outlet obstruction, as she is tolerating diet and has no vomiting and has no significant abdominal distention.  I suspect she has generalized GI tract dysmotility (including gastroparesis) likely at least in part from her diabetes. Medical therapy for her anemia, under guidance of hematology input. Would not do any type of endoscopic procedure, unless an emergency, in patient with Hgb 5.4 who will not accept blood products. Plan for discharge once Hgb >/= 7. Would repeat abdominal xray in 1-2 weeks to make sure capsule has passed.  Patient reports bowel movement every one week or so, thus delayed transit of capsule not unexpected. [facility] will follow along at a distance.  Please call back if needed in the interim.   Freddy Jaksch 07/27/2022, 10:49 AM   Cell [phone] If no answer or after 5 PM call [phone]

## 2022-07-28 ENCOUNTER — Encounter (HOSPITAL_COMMUNITY): Payer: Self-pay

## 2022-07-28 ENCOUNTER — Ambulatory Visit (HOSPITAL_COMMUNITY): Payer: Medicare Other | Attending: Pulmonary Disease

## 2022-07-28 DIAGNOSIS — D509 Iron deficiency anemia, unspecified: Secondary | ICD-10-CM | POA: Diagnosis not present

## 2022-07-28 DIAGNOSIS — E1143 Type 2 diabetes mellitus with diabetic autonomic (poly)neuropathy: Secondary | ICD-10-CM | POA: Diagnosis not present

## 2022-07-28 DIAGNOSIS — K921 Melena: Secondary | ICD-10-CM | POA: Diagnosis not present

## 2022-07-28 DIAGNOSIS — D649 Anemia, unspecified: Secondary | ICD-10-CM | POA: Diagnosis not present

## 2022-07-28 DIAGNOSIS — E871 Hypo-osmolality and hyponatremia: Secondary | ICD-10-CM | POA: Diagnosis not present

## 2022-07-28 LAB — CBC
HCT: 26 % — ABNORMAL LOW (ref 36.0–46.0)
Hemoglobin: 6.9 g/dL — CL (ref 12.0–15.0)
MCH: 18.9 pg — ABNORMAL LOW (ref 26.0–34.0)
MCHC: 26.5 g/dL — ABNORMAL LOW (ref 30.0–36.0)
MCV: 71.2 fL — ABNORMAL LOW (ref 80.0–100.0)
Platelets: 351 10*3/uL (ref 150–400)
RBC: 3.65 MIL/uL — ABNORMAL LOW (ref 3.87–5.11)
RDW: 22.9 % — ABNORMAL HIGH (ref 11.5–15.5)
WBC: 9.4 10*3/uL (ref 4.0–10.5)
nRBC: 2.1 % — ABNORMAL HIGH (ref 0.0–0.2)

## 2022-07-28 LAB — COMPREHENSIVE METABOLIC PANEL
ALT: 15 U/L (ref 0–44)
AST: 26 U/L (ref 15–41)
Albumin: 3.4 g/dL — ABNORMAL LOW (ref 3.5–5.0)
Alkaline Phosphatase: 84 U/L (ref 38–126)
Anion gap: 10 (ref 5–15)
BUN: 7 mg/dL — ABNORMAL LOW (ref 8–23)
CO2: 22 mmol/L (ref 22–32)
Calcium: 8.9 mg/dL (ref 8.9–10.3)
Chloride: 103 mmol/L (ref 98–111)
Creatinine, Ser: 0.71 mg/dL (ref 0.44–1.00)
GFR, Estimated: >60 mL/min (ref >60)
Glucose, Bld: 86 mg/dL (ref 70–99)
Potassium: 4.1 mmol/L (ref 3.5–5.1)
Sodium: 135 mmol/L (ref 135–145)
Total Bilirubin: 0.5 mg/dL (ref 0.3–1.2)
Total Protein: 6.2 g/dL — ABNORMAL LOW (ref 6.5–8.1)

## 2022-07-28 MED ORDER — SODIUM CHLORIDE 0.9% IV SOLUTION: Freq: Once | INTRAVENOUS | Status: DC

## 2022-07-28 MED ORDER — APIXABAN 5 MG PO TABS: 5.0000 mg | ORAL_TABLET | Freq: Two times a day (BID) | ORAL | Status: DC

## 2022-07-28 NOTE — Discharge Summary (Addendum)
Physician Discharge Summary  Sandra Ruiz OZD:[id number] DOB: February 04, 1945 DOA: 07/23/2022  PCP: Jac Canavan, PA-C  Admit date: 07/23/2022 Discharge date: 07/28/2022 30 Day Unplanned Readmission Risk Score    Flowsheet Row ED to Hosp-Admission (Current) from 07/23/2022 in [facility]  30 Day Unplanned Readmission Risk Score (%) 14.1 Filed at 07/28/2022 0801       This score is the patient's risk of an unplanned readmission within 30 days of being discharged (0 -100%). The score is based on dignosis, age, lab data, medications, orders, and past utilization.   Low:  0-14.9   Medium: 15-21.9   High: 22-29.9   Extreme: 30 and above          Admitted From: Home Disposition:  Home  Recommendations for Outpatient Follow-up:  Follow up with PCP in 1-2 weeks Please obtain BMP/CBC in one week Follow-up with oncology in 1 week Please follow up with your PCP on the following pending results: Unresulted Labs (From admission, onward)    None         Home Health: None Equipment/Devices: None  Discharge Condition: Stable CODE STATUS: Full code Diet recommendation: Cardiac  Subjective: Seen and went.  No complaints.  Weakness is improving.  She feels better today.  She is excited to go home today.  Brief/Interim Summary: 77 year old female with PAF on Eliquis, DM2, hypothyroidism, prior TIA comes into the hospital with generalized weakness for the past few months.  She also reports intermittent melena about 1 episode per month.  She was found to have a hemoglobin of 5.8 with no clear source of blood loss currently.  She was admitted to the hospital.  Of note, she is a TE[employer] teacher [employer] and does not want blood products.  Details below.  Symptomatic anemia/history of PUD presented with melena-she has both iron and B12 deficiency, although B12 appears as "normal", it is on the very low end of normal.  She was seen by GI.  Patient has history of getting unrevealing EGD  and colonoscopy in January 2023 and thus GI decided to do capsule endoscopy.  She swallowed the capsule 2 days ago but due to her history of slow bowel and gastric motility, possibly gastroparesis, she has not passed the capsule liaised.  She received several doses of IV iron and EPO.  Her hemoglobin has now improved to 6.9.  She was also seen by oncology.  They recommended bone marrow biopsy if her hemoglobin does not improve in 1 month.  They also recommended that she can be discharged if her hemoglobin improves over 6.5.  All of this was discussed with the patient and she is happy that she is going to be discharged home today.  Her Eliquis was held during this hospitalization.  Discussed with Dr. Dulce Sellar of [facility] today who recommended holding her Eliquis for at least 1 week and repeating her CBC at PCPs office or oncology office and if hemoglobin does not fall further, may consider resuming Eliquis.  GI will follow-up with her as outpatient with results of the capsule endoscopy.   PAF - in sinus now, continue home medications except anticoagulation   HLD -continue Home meds   Hypokalemia -resolved   Hyponatremia -resolved   Hypothyroidism - resume synthroid  Discharge plan was discussed with patient and/or family member and they verbalized understanding and agreed with it.  Discharge Diagnoses:  Principal Problem:   Symptomatic anemia Active Problems:   Diabetes mellitus (HCC)   Hyperlipidemia  Essential hypertension, benign   OSA (obstructive sleep apnea)   Hypothyroidism    Discharge Instructions   Allergies as of 07/28/2022       Reactions   Atorvastatin Other (See Comments)   Extreme myalgias   Baclofen Swelling   Crestor [rosuvastatin] Other (See Comments)   Extreme myalgias   Metformin And Related Nausea Only   Verapamil Swelling   Silicone Itching, Rash   Wellbutrin [bupropion] Anxiety, Other (See Comments)   Hallucinations also        Medication List      TAKE these medications    Accu-Chek Guide Me w/Device Kit Test 1-2 times daily   Accu-Chek Guide test strip Generic drug: glucose blood TEST 1 - 2 TIMES DAILY   Accu-Chek Softclix Lancets lancets Use as instructed   ALPRAZolam 0.25 MG tablet Commonly known as: XANAX Take 1 tablet (0.25 mg total) by mouth 2 (two) times daily as needed for anxiety.   apixaban 5 MG Tabs tablet Commonly known as: Eliquis Take 1 tablet (5 mg total) by mouth 2 (two) times daily. Start taking on: August 04, 2022 What changed: These instructions start on August 04, 2022. If you are unsure what to do until then, ask your doctor or other care provider.   Artificial Tears ophthalmic solution Place 1 drop into both eyes 3 (three) times daily as needed (for dryness).   Benadryl Allergy 25 MG tablet Generic drug: diphenhydrAMINE Take 25 mg by mouth every 6 (six) hours as needed for itching.   diltiazem 240 MG 24 hr capsule Commonly known as: Dilt-XR TAKE 1 CAPSULE BY MOUTH DAILY What changed:  how much to take how to take this when to take this additional instructions   diltiazem 30 MG tablet Commonly known as: CARDIZEM TAKE 1 TABLET BY MOUTH EVERY 4 HOURS AS NEEDED FOR AFIB HEART RATE >100 AS LONG AS TOP BP >100 What changed:  how much to take how to take this when to take this reasons to take this additional instructions   dronedarone 400 MG tablet Commonly known as: MULTAQ Take 1 tablet (400 mg total) by mouth 2 (two) times daily with a meal.   DULoxetine 60 MG capsule Commonly known as: CYMBALTA TAKE 1 CAPSULE BY MOUTH TWICE  DAILY   ezetimibe 10 MG tablet Commonly known as: ZETIA TAKE 1 TABLET BY MOUTH DAILY   furosemide 20 MG tablet Commonly known as: LASIX TAKE 0.5 TABLET BY MOUTH DAILY AS NEEDED SWELLING OR WEIGHT GAIN What changed: See the new instructions.   gabapentin 800 MG tablet Commonly known as: NEURONTIN TAKE 1 TABLET BY MOUTH 3 TIMES  DAILY   levothyroxine 75  MCG tablet Commonly known as: SYNTHROID TAKE 1 TABLET BY MOUTH DAILY  BEFORE BREAKFAST   Mounjaro 10 MG/0.5ML Pen Generic drug: tirzepatide Inject 10 mg into the skin every Wednesday.   nitroGLYCERIN 0.4 MG SL tablet Commonly known as: NITROSTAT DISSOLVE 1 TABLET UNDER THE  TONGUE EVERY 5 MINUTES AS NEEDED FOR CHEST PAIN. MAX OF 3 TABLETS IN 15 MINUTES. CALL 911 IF PAIN  PERSISTS. What changed: See the new instructions.   omeprazole 20 MG capsule Commonly known as: PRILOSEC TAKE 1 CAPSULE BY MOUTH DAILY What changed: when to take this   oxybutynin 10 MG 24 hr tablet Commonly known as: DITROPAN-XL TAKE 1 TABLET BY MOUTH TWICE  DAILY What changed: when to take this   pravastatin 40 MG tablet Commonly known as: PRAVACHOL TAKE 1 TABLET BY MOUTH IN THE  EVENING What changed: when to take this   TYLENOL 500 MG tablet Generic drug: acetaminophen Take 500-1,000 mg by mouth every 6 (six) hours as needed for mild pain or headache.        Follow-up Information     Tysinger, Kermit Balo, PA-C Follow up in 1 week(s).   Specialty: Family Medicine Contact information: [address] [place] [address] [phone]         Serena Croissant, MD Follow up in 1 week(s).   Specialty: Hematology and Oncology Contact information: [address] [place] [address] [phone]                Allergies  Allergen Reactions   Atorvastatin Other (See Comments)    Extreme myalgias   Baclofen Swelling   Crestor [Rosuvastatin] Other (See Comments)    Extreme myalgias   Metformin And Related Nausea Only   Verapamil Swelling   Silicone Itching and Rash   Wellbutrin [Bupropion] Anxiety and Other (See Comments)    Hallucinations also    Consultations: GI and oncology   Procedures/Studies: DG Abd Portable 1V  Result Date: 07/27/2022 CLINICAL DATA:  Nausea EXAM: PORTABLE ABDOMEN - 1 VIEW COMPARISON:  KUB 1 day prior FINDINGS: The endoscopic capsule now  projects over the midabdomen to the left of midline, possibly in the small bowel. There is no evidence of bowel obstruction. There is no definite free intraperitoneal air. There is no abnormal soft tissue calcification The imaged lung bases are clear. There is no acute osseous abnormality. IMPRESSION: Endoscopic capsule now projects over the midabdomen to the left of midline, possibly in the small bowel. Electronically Signed   By: Lesia Hausen M.D.   On: 07/27/2022 11:44   DG Abd 1 View  Result Date: 07/26/2022 CLINICAL DATA:  [id number] Retained foreign body [id number] EXAM: ABDOMEN - 1 VIEW COMPARISON:  CT 07/24/2022 FINDINGS: Endoscopic capsule projects within the gastric body. The bowel gas pattern is normal. No radio-opaque calculi or other significant radiographic abnormality are seen. IMPRESSION: Endoscopic capsule projects within the gastric body. Electronically Signed   By: Duanne Guess D.O.   On: 07/26/2022 16:46   CT ABDOMEN PELVIS W CONTRAST  Result Date: 07/24/2022 CLINICAL DATA:  Acute abdominal pain and dropping hemoglobin, initial encounter EXAM: CT ABDOMEN AND PELVIS WITH CONTRAST TECHNIQUE: Multidetector CT imaging of the abdomen and pelvis was performed using the standard protocol following bolus administration of intravenous contrast. RADIATION DOSE REDUCTION: This exam was performed according to the departmental dose-optimization program which includes automated exposure control, adjustment of the mA and/or kV according to patient size and/or use of iterative reconstruction technique. CONTRAST:  OMNIPAQUE IOHEXOL 300 MG/ML  SOLN COMPARISON:  None Available. FINDINGS: Lower chest: No acute abnormality. Hepatobiliary: No focal liver abnormality is seen. No gallstones, gallbladder wall thickening, or biliary dilatation. Pancreas: Unremarkable. No pancreatic ductal dilatation or surrounding inflammatory changes. Spleen: Normal in size without focal abnormality. Adrenals/Urinary Tract:  Adrenal glands are within normal limits. Kidneys demonstrate a normal enhancement pattern bilaterally. No renal calculi or obstructive changes are noted. Normal excretion is noted on delayed images. A bladder is well distended. Stomach/Bowel: Scattered diverticular change of the colon is noted without evidence of diverticulitis. The appendix is within normal limits. Stomach and small bowel are unremarkable. Vascular/Lymphatic: Aortic atherosclerosis. No enlarged abdominal or pelvic lymph nodes. Reproductive: 4.5 cm exophytic uterine fibroid is noted on the right. No adnexal mass lesion is noted. Other: Small fat containing inguinal hernia is noted  on the right. Musculoskeletal: No acute or significant osseous findings. IMPRESSION: Diverticulosis without evidence of diverticulitis. Uterine fibroid on the right. No acute abnormality is noted to correspond given history. Electronically Signed   By: Alcide Clever M.D.   On: 07/24/2022 18:37   CUP PACEART REMOTE DEVICE CHECK  Result Date: 06/29/2022 ILR summary report received. Battery status OK. Normal device function. No new symptom, brady, or pause episodes. AT/AF episodes per trends, hx of PAF, burden 0%, Eliquis per PA report. Monthly summary reports and ROV/PRN 2 tachy events, slightly irregular, 21-36sec in duration, HR's 133-150 LA, CVRS    Discharge Exam: Vitals:   07/27/22 2100 07/28/22 0608  BP: 125/68 122/85  Pulse: 74 77  Resp: 20 18  Temp: 98.2 F (36.8 C) 98.2 F (36.8 C)  SpO2: 96% 96%   Vitals:   07/27/22 0516 07/27/22 1341 07/27/22 2100 07/28/22 0608  BP: 114/75 (!) 126/59 125/68 122/85  Pulse: 77 79 74 77  Resp: 14 18 20 18   Temp: 98.6 F (37 C) 98.6 F (37 C) 98.2 F (36.8 C) 98.2 F (36.8 C)  TempSrc: Oral Oral Oral Oral  SpO2: 93% 94% 96% 96%  Weight:      Height:        General: Pt is alert, awake, not in acute distress Cardiovascular: RRR, S1/S2 +, no rubs, no gallops Respiratory: CTA bilaterally, no wheezing, no  rhonchi Abdominal: Soft, NT, ND, bowel sounds + Extremities: no edema, no cyanosis    The results of significant diagnostics from this hospitalization (including imaging, microbiology, ancillary and laboratory) are listed below for reference.     Microbiology: No results found for this or any previous visit (from the past 240 hour(s)).   Labs: BNP (last 3 results) No results for input(s): "BNP" in the last 8760 hours. Basic Metabolic Panel: Recent Labs  Lab 07/22/22 0905 07/23/22 1018 07/24/22 0603 07/28/22 0606  NA 137 133* 135 135  K 4.1 3.2* 4.5 4.1  CL 100 101 105 103  CO2 23 25 25 22   GLUCOSE 94 107* 91 86  BUN 13 15 11  7*  CREATININE 0.80 0.76 0.62 0.71  CALCIUM 9.6 8.6* 8.3* 8.9   Liver Function Tests: Recent Labs  Lab 07/23/22 1018 07/28/22 0606  AST 24 26  ALT 18 15  ALKPHOS 78 84  BILITOT 0.4 0.5  PROT 6.5 6.2*  ALBUMIN 3.5 3.4*   No results for input(s): "LIPASE", "AMYLASE" in the last 168 hours. No results for input(s): "AMMONIA" in the last 168 hours. CBC: Recent Labs  Lab 07/24/22 0603 07/25/22 0611 07/26/22 0602 07/27/22 0627 07/28/22 0606  WBC 5.5 6.3 6.4 7.1 9.4  NEUTROABS 3.8  --   --   --   --   HGB 5.3* 5.6* 5.4* 5.4* 6.9*  HCT 20.3* 21.6* 20.5* 21.0* 26.0*  MCV 66.6* 67.5* 67.9* 70.5* 71.2*  PLT 337 361 361 324 351   Cardiac Enzymes: No results for input(s): "CKTOTAL", "CKMB", "CKMBINDEX", "TROPONINI" in the last 168 hours. BNP: Invalid input(s): "POCBNP" CBG: Recent Labs  Lab 07/23/22 0910  GLUCAP 149*   D-Dimer No results for input(s): "DDIMER" in the last 72 hours. Hgb A1c No results for input(s): "HGBA1C" in the last 72 hours. Lipid Profile No results for input(s): "CHOL", "HDL", "LDLCALC", "TRIG", "CHOLHDL", "LDLDIRECT" in the last 72 hours. Thyroid function studies No results for input(s): "TSH", "T4TOTAL", "T3FREE", "THYROIDAB" in the last 72 hours.  Invalid input(s): "FREET3" Anemia work up No results for  input(s): "VITAMINB12", "FOLATE", "FERRITIN", "TIBC", "IRON", "RETICCTPCT" in the last 72 hours. Urinalysis    Component Value Date/Time   COLORURINE YELLOW 07/23/2022 2120   APPEARANCEUR CLEAR 07/23/2022 2120   LABSPEC 1.013 07/23/2022 2120   PHURINE 6.0 07/23/2022 2120   GLUCOSEU NEGATIVE 07/23/2022 2120   HGBUR NEGATIVE 07/23/2022 2120   BILIRUBINUR NEGATIVE 07/23/2022 2120   KETONESUR NEGATIVE 07/23/2022 2120   PROTEINUR NEGATIVE 07/23/2022 2120   NITRITE NEGATIVE 07/23/2022 2120   LEUKOCYTESUR NEGATIVE 07/23/2022 2120   Sepsis Labs Recent Labs  Lab 07/25/22 0611 07/26/22 0602 07/27/22 0627 07/28/22 0606  WBC 6.3 6.4 7.1 9.4   Microbiology No results found for this or any previous visit (from the past 240 hour(s)).  FURTHER DISCHARGE INSTRUCTIONS:   Get Medicines reviewed and adjusted: Please take all your medications with you for your next visit with your Primary MD   Laboratory/radiological data: Please request your Primary MD to go over all hospital tests and procedure/radiological results at the follow up, please ask your Primary MD to get all Hospital records sent to his/her office.   In some cases, they will be blood work, cultures and biopsy results pending at the time of your discharge. Please request that your primary care M.D. goes through all the records of your hospital data and follows up on these results.   Also Note the following: If you experience worsening of your admission symptoms, develop shortness of breath, life threatening emergency, suicidal or homicidal thoughts you must seek medical attention immediately by calling 911 or calling your MD immediately  if symptoms less severe.   You must read complete instructions/literature along with all the possible adverse reactions/side effects for all the Medicines you take and that have been prescribed to you. Take any new Medicines after you have completely understood and accpet all the possible adverse  reactions/side effects.    Do not drive when taking Pain medications or sleeping medications (Benzodaizepines)   Do not take more than prescribed Pain, Sleep and Anxiety Medications. It is not advisable to combine anxiety,sleep and pain medications without talking with your primary care practitioner   Special Instructions: If you have smoked or chewed Tobacco  in the last 2 yrs please stop smoking, stop any regular Alcohol  and or any Recreational drug use.   Wear Seat belts while driving.   Please note: You were cared for by a hospitalist during your hospital stay. Once you are discharged, your primary care physician will handle any further medical issues. Please note that NO REFILLS for any discharge medications will be authorized once you are discharged, as it is imperative that you return to your primary care physician (or establish a relationship with a primary care physician if you do not have one) for your post hospital discharge needs so that they can reassess your need for medications and monitor your lab values  Time coordinating discharge: Over 30 minutes  SIGNED:   Hughie Closs, MD  [facility] 07/28/2022, 9:47 AM *Please note that this is a verbal dictation therefore any spelling or grammatical errors are due to the "[employer]agon [employer] One" system interpretation. If 7PM-7AM, please contact night-coverage [email or web address]

## 2022-07-28 NOTE — Progress Notes (Signed)
Patient has discharge orders. MD wants iron infusion before discharge.  Flushed the current IV and it is swollen and red.  New IV started.  Iron infusion started at 1100 and will run over two hours.

## 2022-07-29 ENCOUNTER — Telehealth: Payer: Self-pay

## 2022-07-29 NOTE — Transitions of Care (Post Inpatient/ED Visit) (Signed)
   07/29/2022  Name: Sandra Ruiz MRN: [id number] DOB: [birth year]  Today's TOC FU Call Status: Today's TOC FU Call Status:: Unsuccessul Call (1st Attempt) Unsuccessful Call (1st Attempt) Date: 07/29/22  Attempted to reach the patient regarding the most recent Inpatient/ED visit.  Follow Up Plan: Additional outreach attempts will be made to reach the patient to complete the Transitions of Care (Post Inpatient/ED visit) call.   Jodelle Gross, RN, BSN, CCM Care Management Coordinator [facility] Phone: [phone]/Fax: [phone]

## 2022-07-30 ENCOUNTER — Telehealth: Payer: Self-pay | Admitting: Medical

## 2022-07-30 ENCOUNTER — Telehealth: Payer: Self-pay

## 2022-07-30 NOTE — Telephone Encounter (Signed)
Pt called states she needs referral to Dr. Trudee Kuster ( hematology) Pt states she was referred to them while in the hospital but when she called to set up appt was told she needs referral  She has hospital follow up with you 1/31

## 2022-07-30 NOTE — Transitions of Care (Post Inpatient/ED Visit) (Signed)
07/30/2022  Name: Sandra Ruiz MRN: [id number] DOB: [birth year]  Today's TOC FU Call Status: Today's TOC FU Call Status:: Successful TOC FU Call Competed TOC FU Call Complete Date: 07/30/22  Transition Care Management Follow-up Telephone Call Date of Discharge: 07/28/22 Discharge Facility: [facility]) How have you been since you were released from the hospital?: Same (Patient has a headache) Any questions or concerns?: Yes Patient Questions/Concerns:: Patient tried to call Dr. Earmon Phoenix office and she was told she needs a referral.  Dr. Pamelia Hoit saw patient in the hospital and she needs an appointment in one week; Patient Questions/Concerns Addressed: Notified Provider of Patient Questions/Concerns  Items Reviewed: Did you receive and understand the discharge instructions provided?: Yes Medications obtained,verified, and reconciled?: Yes (Medications Reviewed) Any new allergies since your discharge?: No Dietary orders reviewed?: No Do you have support at home?: Yes People in Home: spouse Name of Support/Comfort Primary Source: Angelo  Medications Reviewed Today: Medications Reviewed Today     Reviewed by Jodelle Gross, RN (Case Manager) on 07/30/22 at 1458  Med List Status: <None>   Medication Order Taking? Sig Documenting Provider Last Dose Status Informant  ACCU-CHEK GUIDE test strip [id number]  TEST 1 - 2 TIMES DAILY Tysinger, Kermit Balo, PA-C  Active   Accu-Chek Softclix Lancets lancets [id number]  Use as instructed Tysinger, Kermit Balo, PA-C  Active   ALPRAZolam Prudy Feeler) 0.25 MG tablet [id number] Yes Take 1 tablet (0.25 mg total) by mouth 2 (two) times daily as needed for anxiety. Tysinger, Kermit Balo, PA-C Taking Active Self           Med Note Henreitta Leber, JACQUELINE L   Thu Jul 22, 2022  8:20 AM)    apixaban (ELIQUIS) 5 MG TABS tablet [id number] No Take 1 tablet (5 mg total) by mouth 2 (two) times daily.  Patient not taking: Reported on 07/30/2022   Hughie Closs, MD Not Taking  Active            Med Note Electa Sniff, [facility]   Fri Jul 30, 2022  2:57 PM) Eliquis on hold until 08/03/21  Artificial Tears ophthalmic solution [id number] Yes Place 1 drop into both eyes 3 (three) times daily as needed (for dryness). [provider] Taking Active Self  BENADRYL ALLERGY 25 MG tablet [id number]  Take 25 mg by mouth every 6 (six) hours as needed for itching. [provider]  Active Self  Blood Glucose Monitoring Suppl (ACCU-CHEK GUIDE ME) w/Device KIT [id number]  Test 1-2 times daily Genia Del  Active   diltiazem (CARDIZEM) 30 MG tablet [id number] Yes TAKE 1 TABLET BY MOUTH EVERY 4 HOURS AS NEEDED FOR AFIB HEART RATE >100 AS LONG AS TOP BP >100  Patient taking differently: Take 30 mg by mouth every 4 (four) hours as needed (FOR AFIB HEART RATE >100 AS LONG AS TOP BP >100).   Fenton, Clint R, PA Taking Active Self  diltiazem (DILT-XR) 240 MG 24 hr capsule [id number] Yes TAKE 1 CAPSULE BY MOUTH DAILY  Patient taking differently: Take 240 mg by mouth daily.   Sheilah Pigeon, PA-C Taking Active Self  dronedarone (MULTAQ) 400 MG tablet [id number] Yes Take 1 tablet (400 mg total) by mouth 2 (two) times daily with a meal. Sheilah Pigeon, PA-C Taking Active Self  DULoxetine (CYMBALTA) 60 MG capsule [id number] Yes TAKE 1 CAPSULE BY MOUTH TWICE  DAILY Tysinger, Kermit Balo, PA-C Taking Active Self  ezetimibe (ZETIA) 10 MG tablet [id number] Yes TAKE 1 TABLET BY MOUTH  DAILY  Patient taking differently: Take 10 mg by mouth daily.   Tysinger, Kermit Balo, PA-C Taking Active Self  furosemide (LASIX) 20 MG tablet [id number]  TAKE 0.5 TABLET BY MOUTH DAILY AS NEEDED SWELLING OR WEIGHT GAIN  Patient taking differently: Take 10 mg by mouth daily as needed ("for swelling or weight gain").   Tysinger, Kermit Balo, PA-C  Active Self  gabapentin (NEURONTIN) 800 MG tablet [id number] Yes TAKE 1 TABLET BY MOUTH 3 TIMES  DAILY Tysinger, Kermit Balo, PA-C Taking Active   levothyroxine (SYNTHROID) 75  MCG tablet [id number] Yes TAKE 1 TABLET BY MOUTH DAILY  BEFORE BREAKFAST  Patient taking differently: Take 75 mcg by mouth daily before breakfast.   Jac Canavan, PA-C Taking Active Self  MOUNJARO 10 MG/0.5ML Pen [id number] Yes Inject 10 mg into the skin every Wednesday. [provider] Taking Active Self  nitroGLYCERIN (NITROSTAT) 0.4 MG SL tablet [id number]  DISSOLVE 1 TABLET UNDER THE  TONGUE EVERY 5 MINUTES AS NEEDED FOR CHEST PAIN. MAX OF 3 TABLETS IN 15 MINUTES. CALL 911 IF PAIN  PERSISTS.  Patient taking differently: Place 0.4 mg under the tongue every 5 (five) minutes x 3 doses as needed for chest pain (CALL 9-1-1 IF PAIN PERSISTS).   Lewayne Bunting, MD  Active Self  omeprazole (PRILOSEC) 20 MG capsule [id number]  TAKE 1 CAPSULE BY MOUTH DAILY  Patient taking differently: Take 20 mg by mouth daily before breakfast.   Tysinger, Kermit Balo, PA-C  Active Self  oxybutynin (DITROPAN-XL) 10 MG 24 hr tablet [id number] Yes TAKE 1 TABLET BY MOUTH TWICE  DAILY  Patient taking differently: Take 10 mg by mouth in the morning and at bedtime.   Tysinger, Kermit Balo, PA-C Taking Active Self  pravastatin (PRAVACHOL) 40 MG tablet [id number] Yes TAKE 1 TABLET BY MOUTH IN THE  EVENING  Patient taking differently: Take 40 mg by mouth at bedtime.   Tysinger, Kermit Balo, PA-C Taking Active Self  TYLENOL 500 MG tablet [id number]  Take 500-1,000 mg by mouth every 6 (six) hours as needed for mild pain or headache. [provider]  Active Self           Home Care and Equipment/Supplies: Were Home Health Services Ordered?: No Any new equipment or medical supplies ordered?: No  Functional Questionnaire: Do you need assistance with bathing/showering or dressing?: No Do you need assistance with meal preparation?: No Do you need assistance with eating?: No Do you have difficulty maintaining continence: No Do you need assistance with getting out of bed/getting out of a chair/moving?: No Do you  have difficulty managing or taking your medications?: No  Follow up appointments reviewed: PCP Follow-up appointment confirmed?: Yes Date of PCP follow-up appointment?: 08/04/22 Follow-up Provider: Crosby Oyster, [facility] Follow-up appointment confirmed?: No Reason Specialist Follow-Up Not Confirmed: [facility] Calling Clinician Notified Provider Practice of Needed Appointment Do you need transportation to your follow-up appointment?: No Do you understand care options if your condition(s) worsen?: Yes-patient verbalized understanding  SDOH Interventions Today    Flowsheet Row Most Recent Value  SDOH Interventions   Food Insecurity Interventions Intervention Not Indicated  Housing Interventions Intervention Not Indicated  Transportation Interventions Intervention Not Indicated      TOC Interventions Today    Flowsheet Row Most Recent Value  TOC Interventions   TOC Interventions Discussed/Reviewed TOC Interventions Discussed, TOC Interventions Reviewed, Contacted provider for patient needs       Jodelle Gross, RN, BSN, CCM Care Management Coordinator [facility] Phone: [phone]/Fax: [phone]

## 2022-07-30 NOTE — Telephone Encounter (Signed)
Received call from patient care coordinator, Gavin Pound regarding pt's need for MD hospital f/u. Message sent to our schedulers to have pt scheduled for lab and MD visit week of 7/29. LVM for Gavin Pound advising this.

## 2022-08-02 ENCOUNTER — Encounter (HOSPITAL_COMMUNITY): Payer: Self-pay | Admitting: Pulmonary Disease

## 2022-08-02 ENCOUNTER — Emergency Department (HOSPITAL_COMMUNITY): Payer: Medicare Other

## 2022-08-02 ENCOUNTER — Encounter (HOSPITAL_COMMUNITY): Payer: Self-pay | Admitting: Emergency Medicine

## 2022-08-02 ENCOUNTER — Telehealth: Payer: Self-pay | Admitting: Hematology and Oncology

## 2022-08-02 ENCOUNTER — Telehealth: Payer: Self-pay | Admitting: Cardiology

## 2022-08-02 ENCOUNTER — Other Ambulatory Visit: Payer: Self-pay | Admitting: Medical

## 2022-08-02 ENCOUNTER — Ambulatory Visit (INDEPENDENT_AMBULATORY_CARE_PROVIDER_SITE_OTHER): Payer: Medicare Other

## 2022-08-02 ENCOUNTER — Telehealth: Payer: Self-pay

## 2022-08-02 ENCOUNTER — Emergency Department (HOSPITAL_COMMUNITY)
Admission: EM | Admit: 2022-08-02 | Discharge: 2022-08-02 | Disposition: A | Discharge status: Home / Self Care | Payer: Medicare Other | Attending: Emergency Medicine | Admitting: Emergency Medicine

## 2022-08-02 DIAGNOSIS — I480 Paroxysmal atrial fibrillation: Secondary | ICD-10-CM | POA: Diagnosis not present

## 2022-08-02 DIAGNOSIS — E119 Type 2 diabetes mellitus without complications: Secondary | ICD-10-CM | POA: Insufficient documentation

## 2022-08-02 DIAGNOSIS — I10 Essential (primary) hypertension: Secondary | ICD-10-CM | POA: Diagnosis not present

## 2022-08-02 DIAGNOSIS — E039 Hypothyroidism, unspecified: Secondary | ICD-10-CM | POA: Diagnosis not present

## 2022-08-02 DIAGNOSIS — D649 Anemia, unspecified: Secondary | ICD-10-CM

## 2022-08-02 DIAGNOSIS — R002 Palpitations: Secondary | ICD-10-CM | POA: Diagnosis not present

## 2022-08-02 DIAGNOSIS — Z743 Need for continuous supervision: Secondary | ICD-10-CM | POA: Diagnosis not present

## 2022-08-02 DIAGNOSIS — I4891 Unspecified atrial fibrillation: Secondary | ICD-10-CM | POA: Insufficient documentation

## 2022-08-02 DIAGNOSIS — I499 Cardiac arrhythmia, unspecified: Secondary | ICD-10-CM | POA: Diagnosis not present

## 2022-08-02 DIAGNOSIS — Z7901 Long term (current) use of anticoagulants: Secondary | ICD-10-CM | POA: Insufficient documentation

## 2022-08-02 DIAGNOSIS — R079 Chest pain, unspecified: Secondary | ICD-10-CM | POA: Insufficient documentation

## 2022-08-02 LAB — CBC
HCT: 28.4 % — ABNORMAL LOW (ref 36.0–46.0)
Hemoglobin: 7.8 g/dL — ABNORMAL LOW (ref 12.0–15.0)
MCH: 20.7 pg — ABNORMAL LOW (ref 26.0–34.0)
MCHC: 27.5 g/dL — ABNORMAL LOW (ref 30.0–36.0)
MCV: 75.5 fL — ABNORMAL LOW (ref 80.0–100.0)
Platelets: 253 10*3/uL (ref 150–400)
RBC: 3.76 MIL/uL — ABNORMAL LOW (ref 3.87–5.11)
WBC: 5.9 10*3/uL (ref 4.0–10.5)
nRBC: 0 % (ref 0.0–0.2)

## 2022-08-02 LAB — CUP PACEART REMOTE DEVICE CHECK
Date Time Interrogation Session: 20240727230840
Implantable Pulse Generator Implant Date: 20231103

## 2022-08-02 LAB — BASIC METABOLIC PANEL
Anion gap: 14 (ref 5–15)
BUN: 11 mg/dL (ref 8–23)
CO2: 22 mmol/L (ref 22–32)
Calcium: 8.8 mg/dL — ABNORMAL LOW (ref 8.9–10.3)
Chloride: 100 mmol/L (ref 98–111)
Creatinine, Ser: 0.93 mg/dL (ref 0.44–1.00)
GFR, Estimated: >60 mL/min (ref >60)
Glucose, Bld: 88 mg/dL (ref 70–99)
Potassium: 3.6 mmol/L (ref 3.5–5.1)
Sodium: 136 mmol/L (ref 135–145)

## 2022-08-02 LAB — MAGNESIUM: Magnesium: 1.9 mg/dL (ref 1.7–2.4)

## 2022-08-02 LAB — TSH: TSH: 3.232 u[IU]/mL (ref 0.350–4.500)

## 2022-08-02 LAB — TROPONIN I (HIGH SENSITIVITY): Troponin I (High Sensitivity): 5 ng/L (ref <18)

## 2022-08-02 MED ORDER — POTASSIUM CHLORIDE CRYS ER 20 MEQ PO TBCR
40.0000 meq | EXTENDED_RELEASE_TABLET | Freq: Once | ORAL | Status: AC
  Administered 2022-08-02: 40 meq via ORAL
  Filled 2022-08-02: qty 2

## 2022-08-02 MED ORDER — DILTIAZEM HCL ER COATED BEADS 300 MG PO CP24: 300.0000 mg | ORAL_CAPSULE | Freq: Every day | ORAL | 0 refills | Status: DC

## 2022-08-02 MED ORDER — POTASSIUM CHLORIDE CRYS ER 20 MEQ PO TBCR: 40.0000 meq | EXTENDED_RELEASE_TABLET | Freq: Two times a day (BID) | ORAL | Status: DC

## 2022-08-02 NOTE — Patient Outreach (Signed)
  Care Coordination   Follow Up Visit Note   08/02/2022 Name: Sandra Ruiz MRN: [id number] DOB: [birth year]  Sandra Ruiz is a 77 y.o. year old female who sees Tysinger, Kermit Balo, PA-C for primary care. I spoke with  Sandra Ruiz by phone today.  What matters to the patients health and wellness today?  Getting in to see Sandra Ruiz.    Goals Addressed               This Visit's Progress     COMPLETED: I need to get an appointment with Sandra Ruiz and they say I need a referral (pt-stated)        .Care Coordination Interventions: Collaborated with Sandra Ruiz at Sandra Ruiz office regarding patient needing a hospital follow up. Per Sandra Ruiz, she has contacted her schedulers and they will get her into the office this week to see Sandra Ruiz and have labs drawn.  Discussed plans with patient for ongoing care management follow up and provided patient with direct contact information for care management team Advised patient to discuss if she should be taking Iron supplements with provider Assessed social determinant of health barriers         SDOH assessments and interventions completed:  No     Care Coordination Interventions:  Yes, provided   Follow up plan: Referral made to nursing    Encounter Outcome:  Pt. Visit Completed

## 2022-08-02 NOTE — Discharge Instructions (Addendum)
Increase your diltiazem dose to 300 mg.

## 2022-08-02 NOTE — Telephone Encounter (Signed)
Returned pt call. She wanted to make sure that Dr. Jens Som new that her Eliquis has been on hold due to severe Anemia and it starts back up on the 31st. She is having some heaviness in her chest and wanted some advice on that. Please advise.

## 2022-08-02 NOTE — ED Provider Notes (Signed)
[place] EMERGENCY DEPARTMENT AT [facility] Provider Note   CSN: [id number] Arrival date & time: 08/02/22  1715     History  Chief Complaint  Patient presents with   Chest Pain    Sandra Ruiz is a 77 y.o. female.  Pt is a 77 yo female with pmhx significant for htn, tia, hld, dm, hypothyroidism, gerd, afib on eliquis.  Pt was admitted from 7/17-24 for symptomatic anemia with a hgb down to 5.8.  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  She was treated with EPO and iron infusions.  Pt has seen [facility], but source of bleeding has not yet been found.  She had a Givens capsule study done on 7/21, but said she has not yet passed the capsule.  EGD and colonoscopy was nl.  She was told to f/u with heme-onc for a possible bone marrow biopsy, but that has not yet been done yet.  Eliquis has been held.  Pt is here today because she had palpitations and cp yesterday. She was called by cards and was told that her implantable loop recorder showed that HR was in the [phone].  She is feeling better now.        Home Medications Prior to Admission medications   Medication Sig Start Date End Date Taking? Authorizing Provider  diltiazem (CARDIZEM CD) 300 MG 24 hr capsule Take 1 capsule (300 mg total) by mouth daily. 08/02/22  Yes Jacalyn Lefevre, MD  ACCU-CHEK GUIDE test strip TEST 1 - 2 TIMES DAILY 05/14/22   Tysinger, Kermit Balo, PA-C  Accu-Chek Softclix Lancets lancets Use as instructed 07/21/22   Tysinger, Kermit Balo, PA-C  ALPRAZolam Prudy Feeler) 0.25 MG tablet Take 1 tablet (0.25 mg total) by mouth 2 (two) times daily as needed for anxiety. 06/11/21   Tysinger, Kermit Balo, PA-C  apixaban (ELIQUIS) 5 MG TABS tablet Take 1 tablet (5 mg total) by mouth 2 (two) times daily. Patient not taking: Reported on 07/30/2022 08/04/22   Hughie Closs, MD  Artificial Tears ophthalmic solution Place 1 drop into both eyes 3 (three) times daily as needed (for dryness).    [provider]  BENADRYL  ALLERGY 25 MG tablet Take 25 mg by mouth every 6 (six) hours as needed for itching.    [provider]  Blood Glucose Monitoring Suppl (ACCU-CHEK GUIDE ME) w/Device KIT Test 1-2 times daily 05/14/22   Tysinger, Kermit Balo, PA-C  diltiazem (CARDIZEM) 30 MG tablet TAKE 1 TABLET BY MOUTH EVERY 4 HOURS AS NEEDED FOR AFIB HEART RATE >100 AS LONG AS TOP BP >100 Patient taking differently: Take 30 mg by mouth every 4 (four) hours as needed (FOR AFIB HEART RATE >100 AS LONG AS TOP BP >100). 04/02/22   Fenton, Clint R, PA  dronedarone (MULTAQ) 400 MG tablet Take 1 tablet (400 mg total) by mouth 2 (two) times daily with a meal. 04/29/22   Sheilah Pigeon, PA-C  DULoxetine (CYMBALTA) 60 MG capsule TAKE 1 CAPSULE BY MOUTH TWICE  DAILY 04/15/22   Tysinger, Kermit Balo, PA-C  ezetimibe (ZETIA) 10 MG tablet TAKE 1 TABLET BY MOUTH DAILY Patient taking differently: Take 10 mg by mouth daily. 11/17/21   Tysinger, Kermit Balo, PA-C  furosemide (LASIX) 20 MG tablet TAKE 0.5 TABLET BY MOUTH DAILY AS NEEDED SWELLING OR WEIGHT GAIN Patient taking differently: Take 10 mg by mouth daily as needed ("for swelling or weight gain"). 09/14/21   Tysinger, Kermit Balo, PA-C  gabapentin (  NEURONTIN) 800 MG tablet TAKE 1 TABLET BY MOUTH 3 TIMES  DAILY 07/27/22   Tysinger, Kermit Balo, PA-C  levothyroxine (SYNTHROID) 75 MCG tablet TAKE 1 TABLET BY MOUTH DAILY  BEFORE BREAKFAST Patient taking differently: Take 75 mcg by mouth daily before breakfast. 05/11/22   Tysinger, Kermit Balo, PA-C  MOUNJARO 10 MG/0.5ML Pen Inject 10 mg into the skin every Wednesday. 06/17/22   [provider]  nitroGLYCERIN (NITROSTAT) 0.4 MG SL tablet DISSOLVE 1 TABLET UNDER THE  TONGUE EVERY 5 MINUTES AS NEEDED FOR CHEST PAIN. MAX OF 3 TABLETS IN 15 MINUTES. CALL 911 IF PAIN  PERSISTS. Patient taking differently: Place 0.4 mg under the tongue every 5 (five) minutes x 3 doses as needed for chest pain (CALL 9-1-1 IF PAIN PERSISTS). 05/11/22   Lewayne Bunting, MD  omeprazole  (PRILOSEC) 20 MG capsule TAKE 1 CAPSULE BY MOUTH DAILY Patient taking differently: Take 20 mg by mouth daily before breakfast. 05/11/22   Tysinger, Kermit Balo, PA-C  oxybutynin (DITROPAN-XL) 10 MG 24 hr tablet TAKE 1 TABLET BY MOUTH TWICE  DAILY Patient taking differently: Take 10 mg by mouth in the morning and at bedtime. 11/17/21   Tysinger, Kermit Balo, PA-C  pravastatin (PRAVACHOL) 40 MG tablet TAKE 1 TABLET BY MOUTH IN THE  EVENING Patient taking differently: Take 40 mg by mouth at bedtime. 05/11/22   Tysinger, Kermit Balo, PA-C  TYLENOL 500 MG tablet Take 500-1,000 mg by mouth every 6 (six) hours as needed for mild pain or headache.    [provider]      Allergies    Atorvastatin, Baclofen, Crestor [rosuvastatin], Metformin and related, Verapamil, Silicone, and Wellbutrin [bupropion]    Review of Systems   Review of Systems  Cardiovascular:  Positive for palpitations.  All other systems reviewed and are negative.   Physical Exam Updated Vital Signs BP 127/81   Pulse 94   Temp 98.6 F (37 C) (Oral)   Resp 16   Ht 5\' 1"  (1.549 m)   Wt 69 kg   SpO2 100%   BMI 28.74 kg/m  Physical Exam Vitals and nursing note reviewed.  Constitutional:      Appearance: Normal appearance.  HENT:     Head: Normocephalic and atraumatic.     Right Ear: External ear normal.     Left Ear: External ear normal.     Nose: Nose normal.     Mouth/Throat:     Mouth: Mucous membranes are moist.     Pharynx: Oropharynx is clear.  Eyes:     Extraocular Movements: Extraocular movements intact.     Conjunctiva/sclera: Conjunctivae normal.     Pupils: Pupils are equal, round, and reactive to light.  Cardiovascular:     Rate and Rhythm: Normal rate and regular rhythm.     Pulses: Normal pulses.     Heart sounds: Normal heart sounds.  Pulmonary:     Effort: Pulmonary effort is normal.     Breath sounds: Normal breath sounds.  Abdominal:     General: Abdomen is flat. Bowel sounds are normal.      Palpations: Abdomen is soft.  Musculoskeletal:        General: Normal range of motion.     Cervical back: Normal range of motion and neck supple.  Skin:    General: Skin is warm.     Capillary Refill: Capillary refill takes less than 2 seconds.  Neurological:     General: No focal deficit present.  Mental Status: She is alert and oriented to person, place, and time.  Psychiatric:        Mood and Affect: Mood normal.        Behavior: Behavior normal.     ED Results / Procedures / Treatments   Labs (all labs ordered are listed, but only abnormal results are displayed) Labs Reviewed  BASIC METABOLIC PANEL - Abnormal; Notable for the following components:      Result Value   Calcium 8.8 (*)    All other components within normal limits  CBC - Abnormal; Notable for the following components:   RBC 3.76 (*)    Hemoglobin 7.8 (*)    HCT 28.4 (*)    MCV 75.5 (*)    MCH 20.7 (*)    MCHC 27.5 (*)    All other components within normal limits  TSH  MAGNESIUM  TROPONIN I (HIGH SENSITIVITY)  TROPONIN I (HIGH SENSITIVITY)    EKG None  Radiology DG Chest Port 1 View  Result Date: 08/02/2022 CLINICAL DATA:  Palpitations. EXAM: PORTABLE CHEST 1 VIEW COMPARISON:  None Available. FINDINGS: The heart size and mediastinal contours are within normal limits. A radiopaque loop recorder device is seen. There is marked severity calcification of the aortic arch. The visualized skeletal structures are unremarkable. IMPRESSION: No active cardiopulmonary disease. Electronically Signed   By: Aram Candela M.D.   On: 08/02/2022 19:04   CUP PACEART REMOTE DEVICE CHECK  Result Date: 08/02/2022 ILR summary report received. Battery status OK. Normal device function. No new symptom, brady, or pause episodes. No new AF episodes. AF burden is 0% of the time.  There were 6 previously viewed and reviewed tachy episodes.   Monthly summary reports and ROV/PRN Hassell Halim, RN, CCDS, [employer]    Procedures Procedures    Medications Ordered in ED Medications  potassium chloride SA (KLOR-CON M) CR tablet 40 mEq (40 mEq Oral Given 08/02/22 1939)    ED Course/ Medical Decision Making/ A&P                             Medical Decision Making Amount and/or Complexity of Data Reviewed Labs: ordered. Radiology: ordered.  Risk Prescription drug management.   This patient presents to the ED for concern of palpitations, this involves an extensive number of treatment options, and is a complaint that carries with it a high risk of complications and morbidity.  The differential diagnosis includes svt, afib, anemia, electrolyte abn, dehydration   Co morbidities that complicate the patient evaluation  htn, tia, hld, dm, hypothyroidism, gerd, afib on eliquis   Additional history obtained:  Additional history obtained from epic chart review External records from outside source obtained and reviewed including husband   Lab Tests:  I Ordered, and personally interpreted labs.  The pertinent results include:  cbc with hgb 7.8 (improved) and bmp nl, mg nl, trop nl   Imaging Studies ordered:  I ordered imaging studies including cxr  I independently visualized and interpreted imaging which showed No active cardiopulmonary disease.  I agree with the radiologist interpretation   Cardiac Monitoring:  The patient was maintained on a cardiac monitor.  I personally viewed and interpreted the cardiac monitored which showed an underlying rhythm of: nsr   Medicines ordered and prescription drug management:   I have reviewed the patients home medicines and have made adjustments as needed    Consultations Obtained:  I requested consultation  with the cardiologist (Dr. Wyline Mood),  and discussed lab and imaging findings as well as pertinent plan -he recommended increasing cardizem to 300 mg.  He also ordered some kdur 40 meq.  Pt can go home and f/u as an outpatient.   Problem List  / ED Course:  Palpitations:  pt has been stable here.  No cp since yesterday and that was associated with the palpitations.  Plan is to increase dilt.   Anemia:  chronic and improving.  Outpatient w/u ongoing.   Reevaluation:  After the interventions noted above, I reevaluated the patient and found that they have :improved   Social Determinants of Health:  Lives at home   Dispostion:  After consideration of the diagnostic results and the patients response to treatment, I feel that the patent would benefit from discharge with outpatient f/u.          Final Clinical Impression(s) / ED Diagnoses Final diagnoses:  Palpitations    Rx / DC Orders ED Discharge Orders          Ordered    diltiazem (CARDIZEM CD) 300 MG 24 hr capsule  Daily        08/02/22 1940              Jacalyn Lefevre, MD 08/02/22 1944

## 2022-08-02 NOTE — ED Triage Notes (Signed)
Pt arrives via EMS from home with reports of her loop recorder alerting her that her HR went above 140 today. Pt states she has been having indigestion today. Pt did have chest pressure yesterday when up and moving that resolved after rest. EMS gave 324 ASA and 1 nitroglycerin.  Pt reports recent admission at [facility] for anemia and is holding eliquis til 7/31.

## 2022-08-02 NOTE — Telephone Encounter (Signed)
Noted patient c/o chest pain per recent phone note. Attempted to contact patient to discuss further. No answer, LMTCB.  [employer] alert received for 4 tachy event, longest duration 42sec, mean HR's 140-154. SVT/ST, this has been seen in the past, Diltiazem. There is an increase in HR's per trends.

## 2022-08-02 NOTE — Telephone Encounter (Signed)
Patient reports yesterday during tachycardia she was heating food in kitchen and experienced sudden onset of "chest heaviness" that lasted for 5-10 minutes. Patient sat down, heaviness subsided. Reports today palpitations and shortness of breath. Unable to send manual transmission (ILR 2 and does not have symptom activator) advised patient to check BP which will advise HR as well. BP: 166/86 & HR: 146 bpm. Patient advised to call 911 and go to [facility] ER for evaluation. Patient agreeable to plan. Husband is currently with patient.

## 2022-08-02 NOTE — Telephone Encounter (Signed)
Scheduled appointment per staff message. Patient is aware of the made appointment.

## 2022-08-02 NOTE — Telephone Encounter (Signed)
Pt c/o medication issue:  1. Name of Medication: apixaban (ELIQUIS) 5 MG TABS tablet   2. How are you currently taking this medication (dosage and times per day)?    3. Are you having a reaction (difficulty breathing--STAT)? no  4. What is your medication issue? Patient calling to see if the dr was aware, that she has been off mediation till 7/31. Please advise

## 2022-08-03 ENCOUNTER — Telehealth: Payer: Self-pay | Admitting: *Deleted

## 2022-08-03 NOTE — Progress Notes (Signed)
  Care Coordination   Note   08/03/2022 Name: NANI DUNCAN MRN: [id number] DOB: 1945-12-23  JAIYLAH KNIBBS is a 77 y.o. year old female who sees Tysinger, Kermit Balo, PA-C for primary care. I reached out to Ander Gaster by phone today to offer care coordination services.  Ms. Jakob was given information about Care Coordination services today including:   The Care Coordination services include support from the care team which includes your Nurse Coordinator, Clinical Social Worker, or Pharmacist.  The Care Coordination team is here to help remove barriers to the health concerns and goals most important to you. Care Coordination services are voluntary, and the patient may decline or stop services at any time by request to their care team member.   Care Coordination Consent Status: Patient agreed to services and verbal consent obtained.   Follow up plan:  Telephone appointment with care coordination team member scheduled for:  08/05/2022  Encounter Outcome:  Pt. Scheduled from referral   Burman Nieves, [facility] Care Coordination Care Guide Direct Dial: [phone]

## 2022-08-03 NOTE — Telephone Encounter (Signed)
Patient was seen in the ER

## 2022-08-04 ENCOUNTER — Ambulatory Visit (INDEPENDENT_AMBULATORY_CARE_PROVIDER_SITE_OTHER): Payer: Medicare Other | Admitting: Medical

## 2022-08-04 VITALS — BP 120/78 | HR 50 | Wt 149.0 lb

## 2022-08-04 DIAGNOSIS — Z79899 Other long term (current) drug therapy: Secondary | ICD-10-CM | POA: Diagnosis not present

## 2022-08-04 DIAGNOSIS — E1169 Type 2 diabetes mellitus with other specified complication: Secondary | ICD-10-CM | POA: Diagnosis not present

## 2022-08-04 DIAGNOSIS — Z531 Procedure and treatment not carried out because of patient's decision for reasons of belief and group pressure: Secondary | ICD-10-CM

## 2022-08-04 DIAGNOSIS — D6869 Other thrombophilia: Secondary | ICD-10-CM | POA: Diagnosis not present

## 2022-08-04 DIAGNOSIS — I10 Essential (primary) hypertension: Secondary | ICD-10-CM | POA: Diagnosis not present

## 2022-08-04 DIAGNOSIS — D649 Anemia, unspecified: Secondary | ICD-10-CM

## 2022-08-04 DIAGNOSIS — I4819 Other persistent atrial fibrillation: Secondary | ICD-10-CM | POA: Diagnosis not present

## 2022-08-04 DIAGNOSIS — E1159 Type 2 diabetes mellitus with other circulatory complications: Secondary | ICD-10-CM

## 2022-08-04 DIAGNOSIS — I700 Atherosclerosis of aorta: Secondary | ICD-10-CM | POA: Diagnosis not present

## 2022-08-04 DIAGNOSIS — I152 Hypertension secondary to endocrine disorders: Secondary | ICD-10-CM | POA: Diagnosis not present

## 2022-08-04 LAB — BASIC METABOLIC PANEL
Chloride: 103 mmol/L (ref 96–106)
Potassium: 4.8 mmol/L (ref 3.5–5.2)
Sodium: 139 mmol/L (ref 134–144)

## 2022-08-04 LAB — MICROALBUMIN / CREATININE URINE RATIO

## 2022-08-04 LAB — HEMOGLOBIN A1C

## 2022-08-04 LAB — CBC
Hematocrit: 31.4 % — ABNORMAL LOW (ref 34.0–46.6)
Hemoglobin: 9 g/dL — ABNORMAL LOW (ref 11.1–15.9)
MCH: 21.7 pg — ABNORMAL LOW (ref 26.6–33.0)
MCHC: 28.7 g/dL — ABNORMAL LOW (ref 31.5–35.7)
MCV: 76 fL — ABNORMAL LOW (ref 79–97)
Platelets: 262 10*3/uL (ref 150–450)
RBC: 4.15 x10E6/uL (ref 3.77–5.28)
RDW: 28.6 % — ABNORMAL HIGH (ref 11.7–15.4)
WBC: 5.5 10*3/uL (ref 3.4–10.8)

## 2022-08-04 LAB — LIPID PANEL

## 2022-08-04 MED ORDER — VITAMIN B-12 1000 MCG PO TABS: 1000.0000 ug | ORAL_TABLET | Freq: Every day | ORAL | 0 refills | Status: DC

## 2022-08-04 MED ORDER — FERROUS GLUCONATE 324 (38 FE) MG PO TABS: 324.0000 mg | ORAL_TABLET | Freq: Two times a day (BID) | ORAL | 2 refills | Status: DC

## 2022-08-04 NOTE — Progress Notes (Signed)
Subjective: Chief Complaint  Patient presents with   Medical Management of Chronic Issues    Diabetes follow-up and hospital follow-up   Here for hospitalization follow-up.  Admit date: 07/23/2022 Discharge date: 07/28/2022  Discharge Diagnoses:  Principal Problem:   Symptomatic anemia Active Problems:   Diabetes mellitus (HCC)   Hyperlipidemia   Essential hypertension, benign   OSA (obstructive sleep apnea)   Hypothyroidism   She was recently hospitalized for symptomatic anemia.  She came to the hospital for concerns of weakness.  Hemoglobin was 5.8 upon admission.  Because of Jehovah's Witness background she did not want blood products.  She has history of vitamin B12 deficiency.  She had a consult with gastroenterology.  She had EGD and colonoscopy in January 2023.  GI did a capsule endoscopy but given some potential gastroparesis the capsule has not passed.  She received several doses of IV iron and EPO in the hospital.  She was also consulted by oncology and hematology who recommended that if her hemoglobin is improving next Monday we will recommend a bone marrow biopsy.  She was on Eliquis but that was recommended to hold for 1 week to repeat CBCs.  Can possibly resume Eliquis if hemoglobin is stable.  History of paroxysmal atrial fibrillation but in sinus rhythm per hospitalization.  She had some low potassium and sodium but that resolved.  She is checking sugars.  Good numbers at home.  She wants to continue mounjaro as it has worked well for her.   No new issues.  Past Medical History:  Diagnosis Date   Chronic pain    Depression    Diabetes mellitus without complication (HCC)    GERD (gastroesophageal reflux disease)    History of TIA (transient ischemic attack)    Hyperlipidemia    Hyperopia of both eyes with astigmatism and presbyopia 12/21/2019   Hypertension    Lyme disease    Paroxysmal atrial fibrillation (HCC)    Sleep apnea    Thyroid disease    Current  Outpatient Medications on File Prior to Visit  Medication Sig Dispense Refill   ACCU-CHEK GUIDE test strip TEST 1 - 2 TIMES DAILY 200 strip 0   Accu-Chek Softclix Lancets lancets Use as instructed 90 each 3   ALPRAZolam (XANAX) 0.25 MG tablet Take 1 tablet (0.25 mg total) by mouth 2 (two) times daily as needed for anxiety. 20 tablet 0   Artificial Tears ophthalmic solution Place 1 drop into both eyes 3 (three) times daily as needed (for dryness).     BENADRYL ALLERGY 25 MG tablet Take 25 mg by mouth every 6 (six) hours as needed for itching.     Blood Glucose Monitoring Suppl (ACCU-CHEK GUIDE ME) w/Device KIT Test 1-2 times daily 1 kit 0   diltiazem (CARDIZEM CD) 300 MG 24 hr capsule Take 1 capsule (300 mg total) by mouth daily. 30 capsule 0   dronedarone (MULTAQ) 400 MG tablet Take 1 tablet (400 mg total) by mouth 2 (two) times daily with a meal. 180 tablet 2   DULoxetine (CYMBALTA) 60 MG capsule TAKE 1 CAPSULE BY MOUTH TWICE  DAILY 200 capsule 1   ezetimibe (ZETIA) 10 MG tablet TAKE 1 TABLET BY MOUTH DAILY (Patient taking differently: Take 10 mg by mouth daily.) 100 tablet 2   gabapentin (NEURONTIN) 800 MG tablet TAKE 1 TABLET BY MOUTH 3 TIMES  DAILY 300 tablet 0   levothyroxine (SYNTHROID) 75 MCG tablet TAKE 1 TABLET BY MOUTH DAILY  BEFORE BREAKFAST (Patient  taking differently: Take 75 mcg by mouth daily before breakfast.) 100 tablet 0   MOUNJARO 10 MG/0.5ML Pen Inject 10 mg into the skin every Wednesday.     nitroGLYCERIN (NITROSTAT) 0.4 MG SL tablet DISSOLVE 1 TABLET UNDER THE  TONGUE EVERY 5 MINUTES AS NEEDED FOR CHEST PAIN. MAX OF 3 TABLETS IN 15 MINUTES. CALL 911 IF PAIN  PERSISTS. (Patient taking differently: Place 0.4 mg under the tongue every 5 (five) minutes x 3 doses as needed for chest pain (CALL 9-1-1 IF PAIN PERSISTS).) 25 tablet 3   omeprazole (PRILOSEC) 20 MG capsule TAKE 1 CAPSULE BY MOUTH DAILY (Patient taking differently: Take 20 mg by mouth daily before breakfast.) 100 capsule 0    oxybutynin (DITROPAN-XL) 10 MG 24 hr tablet TAKE 1 TABLET BY MOUTH TWICE  DAILY (Patient taking differently: Take 10 mg by mouth in the morning and at bedtime.) 200 tablet 2   pravastatin (PRAVACHOL) 40 MG tablet TAKE 1 TABLET BY MOUTH IN THE  EVENING (Patient taking differently: Take 40 mg by mouth at bedtime.) 100 tablet 0   TYLENOL 500 MG tablet Take 500-1,000 mg by mouth every 6 (six) hours as needed for mild pain or headache.     apixaban (ELIQUIS) 5 MG TABS tablet Take 1 tablet (5 mg total) by mouth 2 (two) times daily. (Patient not taking: Reported on 07/30/2022)     diltiazem (CARDIZEM) 30 MG tablet TAKE 1 TABLET BY MOUTH EVERY 4 HOURS AS NEEDED FOR AFIB HEART RATE >100 AS LONG AS TOP BP >100 (Patient not taking: Reported on 08/04/2022) 90 tablet 1   furosemide (LASIX) 20 MG tablet TAKE 0.5 TABLET BY MOUTH DAILY AS NEEDED SWELLING OR WEIGHT GAIN (Patient not taking: Reported on 08/04/2022) 90 tablet 0   No current facility-administered medications on file prior to visit.   ROS as in subjective   Objective: BP 120/78   Pulse (!) 50   Wt 149 lb (67.6 kg)   BMI 28.15 kg/m   Gen: wd, wn ,nad Heart rrr, normal s1, s2, no murmurs Lungs clear Ext: no edema Pulses WNL    Assessment: Encounter Diagnoses  Name Primary?   Symptomatic anemia Yes   Hypercoagulable state due to persistent atrial fibrillation (HCC)    Essential hypertension, benign    High risk medication use    Type 2 diabetes mellitus with other specified complication, without long-term current use of insulin (HCC)    Aortic atherosclerosis (HCC)    Hypertension associated with diabetes (HCC)    Blood transfusion declined because patient is Jehovah's Witness       Plan: I reviewed her hospitalization discharge summary.  Medicines were reconciled.  Eliquis was held until repeat CBC today.  Her other medicines were continued.  No new medication changes otherwise.  Symptomatic anemia requiring multiple iron  infusions this hospitalization as well as GI and hematology consult.  Follow-up with hematology for recent referral.  Follow-up with gastroenterology given concerns for possible gastroparesis and anemia. She has outpatient followup with hematology next week.  Diabetes type 2-most recent hemoglobin A1c April 24 of 6.3%.  Updated hemoglobin A1c today.  Currently on Mounjaro 10 mg weekly.  We discussed her recent potential gastroparesis given the capsule endoscopy findings. She doesn't want to change mounjaro dose currently.  Hyperlipidemia-elevated labs today.  Continue Pravachol 40 mg daily and Zetia 10 mg daily  Hypothyroidism-recent TSH normal.  Continue levothyroxine 75 mcg daily  Paroxysmal A-fib-on dronedarone and diltiazem per cardiology.  Continue routine  follow-up with cardiology  Chronic anticoagulation due to A-fib-Eliquis was held given the anemia.  She was advised that if CBC is stable after hospital follow-up.  She is using Eliquis.  Updated CBC today.   Sandra Ruiz was seen today for medical management of chronic issues.  Diagnoses and all orders for this visit:  Symptomatic anemia -     CBC  Hypercoagulable state due to persistent atrial fibrillation (HCC)  Essential hypertension, benign -     Basic metabolic panel  High risk medication use  Type 2 diabetes mellitus with other specified complication, without long-term current use of insulin (HCC) -     Basic metabolic panel -     Hemoglobin A1c -     Microalbumin/Creatinine Ratio, Urine  Aortic atherosclerosis (HCC) -     Lipid panel  Hypertension associated with diabetes (HCC)  Blood transfusion declined because patient is Jehovah's Witness  Other orders -     ferrous gluconate (FERGON) 324 MG tablet; Take 1 tablet (324 mg total) by mouth 2 (two) times daily with a meal. -     cyanocobalamin (VITAMIN B12) 1000 MCG tablet; Take 1 tablet (1,000 mcg total) by mouth daily.    F/u pending labs

## 2022-08-05 ENCOUNTER — Other Ambulatory Visit: Payer: Self-pay | Admitting: Medical

## 2022-08-05 ENCOUNTER — Ambulatory Visit: Payer: Self-pay | Admitting: Licensed Clinical Social Worker

## 2022-08-05 MED ORDER — MOUNJARO 7.5 MG/0.5ML ~~LOC~~ SOAJ: 7.5000 mg | SUBCUTANEOUS | 1 refills | Status: DC

## 2022-08-05 NOTE — Progress Notes (Signed)
Results sent through MyChart

## 2022-08-06 ENCOUNTER — Telehealth: Payer: Self-pay | Admitting: Hematology and Oncology

## 2022-08-06 ENCOUNTER — Telehealth: Payer: Self-pay

## 2022-08-06 ENCOUNTER — Other Ambulatory Visit: Payer: Self-pay

## 2022-08-06 DIAGNOSIS — D649 Anemia, unspecified: Secondary | ICD-10-CM

## 2022-08-06 NOTE — Patient Outreach (Signed)
  Care Coordination   Initial Visit Note   08/05/2022 Name: Sandra Ruiz MRN: [id number] DOB: October 26, 1945  Sandra Ruiz is a 77 y.o. year old female who sees Tysinger, Kermit Balo, PA-C for primary care. I spoke with  Ander Gaster by phone today.  What matters to the patients health and wellness today?  Financial Strain, Aid Assistance    Goals Addressed             This Visit's Progress    Obtain Supportive Resources-Financial   On track    Activities and task to complete in order to accomplish goals.   Keep all upcoming appointments discussed today Continue with compliance of taking medication prescribed by Doctor Implement healthy coping skills discussed to assist with management of symptoms         SDOH assessments and interventions completed:  No     Care Coordination Interventions:  Yes, provided  Interventions Today    Flowsheet Row Most Recent Value  Chronic Disease   Chronic disease during today's visit Hypertension (HTN), Diabetes, Atrial Fibrillation (AFib), Congestive Heart Failure (CHF), Other  [Depression, Anxiety, Chronic Pain]  General Interventions   General Interventions Discussed/Reviewed General Interventions Discussed, Community Resources, Doctor Visits, Level of Care  Doctor Visits Discussed/Reviewed Doctor Visits Discussed  Level of Care Personal Care Services  Mental Health Interventions   Mental Health Discussed/Reviewed Mental Health Discussed, Coping Strategies, Anxiety, Depression  Nutrition Interventions   Nutrition Discussed/Reviewed Nutrition Discussed  Pharmacy Interventions   Pharmacy Dicussed/Reviewed Pharmacy Topics Discussed, Medication Adherence  Safety Interventions   Safety Discussed/Reviewed Safety Discussed       Follow up plan: Follow up call scheduled for 2-4 weeks    Encounter Outcome:  Pt. Visit Completed   Jenel Lucks, MSW, LCSW [facility] Care Management [facility]  [facility] [email or web address] Phone [phone] 6:14 AM

## 2022-08-06 NOTE — Telephone Encounter (Signed)
Transition Care Management Follow-up Telephone Call Date of discharge and from where: 08/02/2022 [facility] How have you been since you were released from the hospital? Patient stated she is feeling very weak and has no energy, also her stomach is upset. Any questions or concerns? No  Items Reviewed: Did the pt receive and understand the discharge instructions provided? Yes  Medications obtained and verified? Yes  Other? No  Any new allergies since your discharge? No  Dietary orders reviewed? Yes Do you have support at home? Yes   Follow up appointments reviewed:  PCP Hospital f/u appt confirmed? Yes  Scheduled to see Kermit Balo. Tysinger, PA-C on 08/04/2022 @ [facility] Medicine. Specialist Hospital f/u appt confirmed? Yes  Scheduled to see Serena Croissant, MD on 08/09/2022 @ [facility]. Are transportation arrangements needed? No  If their condition worsens, is the pt aware to call PCP or go to the Emergency Dept.? Yes Was the patient provided with contact information for the PCP's office or ED? Yes Was to pt encouraged to call back with questions or concerns? Yes  Coreon Simkins [facility]  [facility] Population Health Community Resource Care Guide   [email or web address]  ?? [phone]   Website: [email or web address]  [email or web address]

## 2022-08-06 NOTE — Telephone Encounter (Signed)
Scheduled appointment per staff message. Patient is aware of the made appointments.

## 2022-08-06 NOTE — Patient Instructions (Signed)
Visit Information  Thank you for taking time to visit with me today. Please don't hesitate to contact me if I can be of assistance to you.   Following are the goals we discussed today:   Goals Addressed             This Visit's Progress    Obtain Supportive Resources-Financial   On track    Activities and task to complete in order to accomplish goals.   Keep all upcoming appointments discussed today Continue with compliance of taking medication prescribed by Doctor Implement healthy coping skills discussed to assist with management of symptoms         Our next appointment is by telephone on 08/29 at 10 AM  Please call the care guide team at [phone] if you need to cancel or reschedule your appointment.   If you are experiencing a Mental Health or Behavioral Health Crisis or need someone to talk to, please call the Suicide and Crisis Lifeline: 988 call 911   Patient verbalizes understanding of instructions and care plan provided today and agrees to view in MyChart. Active MyChart status and patient understanding of how to access instructions and care plan via MyChart confirmed with patient.     Jenel Lucks, MSW, LCSW [facility] Care Management [place]  [facility] [email or web address] Phone [phone] 6:15 AM

## 2022-08-09 ENCOUNTER — Ambulatory Visit: Payer: Medicare Other

## 2022-08-09 ENCOUNTER — Inpatient Hospital Stay: Payer: Medicare Other

## 2022-08-09 ENCOUNTER — Inpatient Hospital Stay: Payer: Medicare Other | Attending: Hematology and Oncology | Admitting: Hematology and Oncology

## 2022-08-09 ENCOUNTER — Other Ambulatory Visit: Payer: Self-pay

## 2022-08-09 VITALS — BP 131/54 | HR 88 | Temp 97.3°F | Resp 18 | Ht 61.0 in | Wt 150.9 lb

## 2022-08-09 DIAGNOSIS — D649 Anemia, unspecified: Secondary | ICD-10-CM | POA: Diagnosis not present

## 2022-08-09 DIAGNOSIS — D509 Iron deficiency anemia, unspecified: Secondary | ICD-10-CM | POA: Diagnosis not present

## 2022-08-09 LAB — CBC WITH DIFFERENTIAL (CANCER CENTER ONLY)
Abs Immature Granulocytes: 0.02 10*3/uL (ref 0.00–0.07)
Basophils Absolute: 0 10*3/uL (ref 0.0–0.1)
Basophils Relative: 1 %
Eosinophils Absolute: 0.1 10*3/uL (ref 0.0–0.5)
Eosinophils Relative: 2 %
HCT: 32.3 % — ABNORMAL LOW (ref 36.0–46.0)
Hemoglobin: 9.5 g/dL — ABNORMAL LOW (ref 12.0–15.0)
Immature Granulocytes: 0 %
Lymphocytes Relative: 25 %
Lymphs Abs: 1.2 10*3/uL (ref 0.7–4.0)
MCH: 22.7 pg — ABNORMAL LOW (ref 26.0–34.0)
MCHC: 29.4 g/dL — ABNORMAL LOW (ref 30.0–36.0)
MCV: 77.3 fL — ABNORMAL LOW (ref 80.0–100.0)
Monocytes Absolute: 0.3 10*3/uL (ref 0.1–1.0)
Monocytes Relative: 6 %
Neutro Abs: 3.1 10*3/uL (ref 1.7–7.7)
Neutrophils Relative %: 66 %
Platelet Count: 321 10*3/uL (ref 150–400)
RBC: 4.18 MIL/uL (ref 3.87–5.11)
Smear Review: NORMAL
WBC Count: 4.8 10*3/uL (ref 4.0–10.5)
nRBC: 0 % (ref 0.0–0.2)

## 2022-08-09 LAB — IRON AND IRON BINDING CAPACITY (CC-WL,HP ONLY)
Iron: 61 ug/dL (ref 28–170)
Saturation Ratios: 20 % (ref 10.4–31.8)
TIBC: 308 ug/dL (ref 250–450)
UIBC: 247 ug/dL (ref 148–442)

## 2022-08-09 LAB — CMP (CANCER CENTER ONLY)
ALT: 16 U/L (ref 0–44)
AST: 32 U/L (ref 15–41)
Albumin: 3.9 g/dL (ref 3.5–5.0)
Alkaline Phosphatase: 88 U/L (ref 38–126)
Anion gap: 6 (ref 5–15)
BUN: 10 mg/dL (ref 8–23)
CO2: 28 mmol/L (ref 22–32)
Calcium: 9 mg/dL (ref 8.9–10.3)
Chloride: 102 mmol/L (ref 98–111)
Creatinine: 0.74 mg/dL (ref 0.44–1.00)
GFR, Estimated: >60 mL/min (ref >60)
Glucose, Bld: 96 mg/dL (ref 70–99)
Potassium: 4.1 mmol/L (ref 3.5–5.1)
Sodium: 136 mmol/L (ref 135–145)
Total Bilirubin: 0.4 mg/dL (ref 0.3–1.2)
Total Protein: 5.8 g/dL — ABNORMAL LOW (ref 6.5–8.1)

## 2022-08-09 LAB — FERRITIN: Ferritin: 312 ng/mL — ABNORMAL HIGH (ref 11–307)

## 2022-08-09 NOTE — Assessment & Plan Note (Signed)
Microcytic iron deficiency anemia:  Hospitalization 07/23/2022-07/28/2022: Admitted with hemoglobin of 5.8, after 6 doses of IV iron and 1 dose of Aranesp, hemoglobin improved to 9 g on 08/04/2022  Recommendation:  Recheck CBC and iron studies Administer more IV iron if necessary Will consider Aranesp once again if needed  Prior history of EGD and colonoscopy negative, capsule endoscopy was also performed. Eliquis was held

## 2022-08-09 NOTE — Progress Notes (Signed)
Patient Care Team: Ruiz, Sandra Balo, PA-C as PCP - General (Family Medicine) Sandra Som Madolyn Frieze, MD as PCP - Cardiology (Cardiology) Sandra, Roberts Gaudy, MD as PCP - Electrophysiology (Cardiology) Sandra Ruiz, [facility] (Inactive) as Pharmacist (Pharmacist)  DIAGNOSIS:  Encounter Diagnosis  Name Primary?   Symptomatic anemia Yes     CHIEF COMPLIANT: Follow-up anemia (Jehovah's Witness).  INTERVAL HISTORY: ALLEANE Ruiz is a 77 y.o female reports to the clinic today for a follow-up after recent hospitalization for severe anemia with a hemoglobin of 5.2.  She received IV iron therapy along with Aranesp injection.  Her hemoglobin a few days ago was 9 g.  Patient reports that she feels somewhat better. She states that she has been getting cramps and headaches on the right side.     ALLERGIES:  is allergic to atorvastatin, baclofen, crestor [rosuvastatin], metformin and related, verapamil, silicone, and wellbutrin [bupropion].  MEDICATIONS:  Current Outpatient Medications  Medication Sig Dispense Refill   tirzepatide (MOUNJARO) 7.5 MG/0.5ML Pen Inject 7.5 mg into the skin once a week. 2 mL 1   ACCU-CHEK GUIDE test strip TEST 1 - 2 TIMES DAILY 200 strip 0   Accu-Chek Softclix Lancets lancets Use as instructed 90 each 3   ALPRAZolam (XANAX) 0.25 MG tablet Take 1 tablet (0.25 mg total) by mouth 2 (two) times daily as needed for anxiety. 20 tablet 0   apixaban (ELIQUIS) 5 MG TABS tablet Take 1 tablet (5 mg total) by mouth 2 (two) times daily. (Patient not taking: Reported on 07/30/2022)     Artificial Tears ophthalmic solution Place 1 drop into both eyes 3 (three) times daily as needed (for dryness).     BENADRYL ALLERGY 25 MG tablet Take 25 mg by mouth every 6 (six) hours as needed for itching.     Blood Glucose Monitoring Suppl (ACCU-CHEK GUIDE ME) w/Device KIT Test 1-2 times daily 1 kit 0   cyanocobalamin (VITAMIN B12) 1000 MCG tablet Take 1 tablet (1,000 mcg total) by mouth daily. 90  tablet 0   diltiazem (CARDIZEM CD) 300 MG 24 hr capsule Take 1 capsule (300 mg total) by mouth daily. 30 capsule 0   diltiazem (CARDIZEM) 30 MG tablet TAKE 1 TABLET BY MOUTH EVERY 4 HOURS AS NEEDED FOR AFIB HEART RATE >100 AS LONG AS TOP BP >100 (Patient not taking: Reported on 08/04/2022) 90 tablet 1   dronedarone (MULTAQ) 400 MG tablet Take 1 tablet (400 mg total) by mouth 2 (two) times daily with a meal. 180 tablet 2   DULoxetine (CYMBALTA) 60 MG capsule TAKE 1 CAPSULE BY MOUTH TWICE  DAILY 200 capsule 1   ezetimibe (ZETIA) 10 MG tablet TAKE 1 TABLET BY MOUTH DAILY (Patient taking differently: Take 10 mg by mouth daily.) 100 tablet 2   ferrous gluconate (FERGON) 324 MG tablet Take 1 tablet (324 mg total) by mouth 2 (two) times daily with a meal. 60 tablet 2   furosemide (LASIX) 20 MG tablet TAKE 0.5 TABLET BY MOUTH DAILY AS NEEDED SWELLING OR WEIGHT GAIN (Patient not taking: Reported on 08/04/2022) 90 tablet 0   gabapentin (NEURONTIN) 800 MG tablet TAKE 1 TABLET BY MOUTH 3 TIMES  DAILY 300 tablet 0   levothyroxine (SYNTHROID) 75 MCG tablet TAKE 1 TABLET BY MOUTH DAILY  BEFORE BREAKFAST (Patient taking differently: Take 75 mcg by mouth daily before breakfast.) 100 tablet 0   nitroGLYCERIN (NITROSTAT) 0.4 MG SL tablet DISSOLVE 1 TABLET UNDER THE  TONGUE EVERY 5 MINUTES AS NEEDED  FOR CHEST PAIN. MAX OF 3 TABLETS IN 15 MINUTES. CALL 911 IF PAIN  PERSISTS. (Patient taking differently: Place 0.4 mg under the tongue every 5 (five) minutes x 3 doses as needed for chest pain (CALL 9-1-1 IF PAIN PERSISTS).) 25 tablet 3   omeprazole (PRILOSEC) 20 MG capsule TAKE 1 CAPSULE BY MOUTH DAILY (Patient taking differently: Take 20 mg by mouth daily before breakfast.) 100 capsule 0   oxybutynin (DITROPAN-XL) 10 MG 24 hr tablet TAKE 1 TABLET BY MOUTH TWICE  DAILY (Patient taking differently: Take 10 mg by mouth in the morning and at bedtime.) 200 tablet 2   pravastatin (PRAVACHOL) 40 MG tablet TAKE 1 TABLET BY MOUTH IN THE   EVENING (Patient taking differently: Take 40 mg by mouth at bedtime.) 100 tablet 0   TYLENOL 500 MG tablet Take 500-1,000 mg by mouth every 6 (six) hours as needed for mild pain or headache.     No current facility-administered medications for this visit.    PHYSICAL EXAMINATION: ECOG PERFORMANCE STATUS: 1 - Symptomatic but completely ambulatory  Vitals:   08/09/22 1301  BP: (!) 131/54  Pulse: 88  Resp: 18  Temp: (!) 97.3 F (36.3 C)  SpO2: 100%   Filed Weights   08/09/22 1301  Weight: 150 lb 14.4 oz (68.4 kg)     LABORATORY DATA:  I have reviewed the data as listed    Latest Ref Rng & Units 08/04/2022    9:57 AM 08/02/2022    6:20 PM 07/28/2022    6:06 AM  CMP  Glucose 70 - 99 mg/dL 79  88  86   BUN 8 - 27 mg/dL 9  11  7    Creatinine 0.57 - 1.00 mg/dL 4.16  6.06  3.01   Sodium 134 - 144 mmol/L 139  136  135   Potassium 3.5 - 5.2 mmol/L 4.8  3.6  4.1   Chloride 96 - 106 mmol/L 103  100  103   CO2 20 - 29 mmol/L 21  22  22    Calcium 8.7 - 10.3 mg/dL 9.2  8.8  8.9   Total Protein 6.5 - 8.1 g/dL   6.2   Total Bilirubin 0.3 - 1.2 mg/dL   0.5   Alkaline Phos 38 - 126 U/L   84   AST 15 - 41 U/L   26   ALT 0 - 44 U/L   15     Lab Results  Component Value Date   WBC 5.5 08/04/2022   HGB 9.0 (L) 08/04/2022   HCT 31.4 (L) 08/04/2022   MCV 76 (L) 08/04/2022   PLT 262 08/04/2022   NEUTROABS 3.8 07/24/2022    ASSESSMENT & PLAN:  Symptomatic anemia Microcytic iron deficiency anemia: Jehovah's Witness Hospitalization 07/23/2022-07/28/2022: Admitted with hemoglobin of 5.8, after 6 doses of IV iron and 1 dose of Aranesp, hemoglobin improved to 9 g on 08/04/2022 08/09/2022: Hemoglobin 9.5 g (patient baseline hemoglobin is between 10 and 11)  Recommendation:  Recheck CBC and iron studies in 1 month Administer more IV iron if necessary Will consider Aranesp once again if needed  Prior history of EGD and colonoscopy negative, capsule endoscopy was also performed. Eliquis has  been restarted.  Patient will discuss with her cardiologist if Eliquis can be stopped.  Return to clinic in 1 month with labs.   No orders of the defined types were placed in this encounter.  The patient has a good understanding of the overall plan. she agrees with  it. she will call with any problems that may develop before the next visit here. Total time spent: 30 mins including face to face time and time spent for planning, charting and co-ordination of care   Sherlyn Lick, CMA 08/09/22    sacroiliac

## 2022-08-11 ENCOUNTER — Ambulatory Visit
Admission: RE | Admit: 2022-08-11 | Discharge: 2022-08-11 | Disposition: A | Discharge status: Home / Self Care | Payer: Medicare Other | Source: Ambulatory Visit | Attending: Gastroenterology | Admitting: Gastroenterology

## 2022-08-11 ENCOUNTER — Other Ambulatory Visit: Payer: Self-pay | Admitting: Gastroenterology

## 2022-08-11 ENCOUNTER — Other Ambulatory Visit: Payer: Self-pay | Admitting: Medical

## 2022-08-11 DIAGNOSIS — Z862 Personal history of diseases of the blood and blood-forming organs and certain disorders involving the immune mechanism: Secondary | ICD-10-CM

## 2022-08-11 DIAGNOSIS — D509 Iron deficiency anemia, unspecified: Secondary | ICD-10-CM | POA: Diagnosis not present

## 2022-08-13 ENCOUNTER — Ambulatory Visit: Payer: Self-pay

## 2022-08-13 NOTE — Patient Instructions (Signed)
Visit Information  Thank you for taking time to visit with me today. Please don't hesitate to contact me if I can be of assistance to you.   Following are the goals we discussed today:   Goals Addressed             This Visit's Progress    Improved medication management for Polypharmacy       Care Coordination Interventions: Patient interviewed about adult health maintenance status including  medication management  Determined patient finds it difficult to manage her medications since being discharged home from the hospital Determined patient completed medication reconciliation with her PCP following her most recent IP event, however she often gets confused due to having several mediation changes and reports having extra medication bottles lying around her home Discussed with patient she would like a SNV to review her medications with her in the setting of her home Sent in basket message to [employer] PA-C advising of patient's concerns and request for a skilled nursing visit         Our next appointment is by telephone on 08/23/22 at 09:30 AM  Please call the care guide team at [phone] if you need to cancel or reschedule your appointment.   If you are experiencing a Mental Health or Behavioral Health Crisis or need someone to talk to, please call 1-800-273-TALK (toll free, 24 hour hotline)  Patient verbalizes understanding of instructions and care plan provided today and agrees to view in MyChart. Active MyChart status and patient understanding of how to access instructions and care plan via MyChart confirmed with patient.     Delsa Sale, RN, BSN, CCM Care Management Coordinator [facility] Care Management Direct Phone: [phone]

## 2022-08-13 NOTE — Patient Outreach (Signed)
  Care Coordination   Initial Visit Note   08/13/2022 Name: Sandra Ruiz MRN: [id number] DOB: 12/14/1945  Sandra Ruiz is a 77 y.o. year old female who sees Tysinger, Kermit Balo, PA-C for primary care. I spoke with  Ander Gaster by phone today.  What matters to the patients health and wellness today?  Patient would like a nurse visit to review her medications.     Goals Addressed             This Visit's Progress    Improved medication management for Polypharmacy       Care Coordination Interventions: Patient interviewed about adult health maintenance status including  medication management  Determined patient finds it difficult to manage her medications since being discharged home from the hospital Determined patient completed medication reconciliation with her PCP following her most recent IP event, however she often gets confused due to having several mediation changes and reports having extra medication bottles lying around her home Discussed with patient she would like a SNV to review her medications with her in the setting of her home Sent in basket message to [employer] PA-C advising of patient's concerns and request for a skilled nursing visit     Interventions Today    Flowsheet Row Most Recent Value  Chronic Disease   Chronic disease during today's visit Atrial Fibrillation (AFib), Diabetes, Other  [Symptomatic Anemia]  General Interventions   General Interventions Discussed/Reviewed General Interventions Discussed, General Interventions Reviewed, Labs, Doctor Visits, Communication with  Doctor Visits Discussed/Reviewed Doctor Visits Reviewed, Doctor Visits Discussed, Specialist, PCP  Communication with PCP/Specialists  Kristian Covey PA-C]  Education Interventions   Education Provided Provided Education  Provided Verbal Education On Labs, When to see the doctor, Medication  Pharmacy Interventions   Pharmacy Dicussed/Reviewed Pharmacy Topics Discussed,  Pharmacy Topics Reviewed, Medication Adherence, Medications and their functions          SDOH assessments and interventions completed:  No     Care Coordination Interventions:  Yes, provided   Follow up plan: Follow up call scheduled for 08/23/22 @09 :30 AM    Encounter Outcome:  Pt. Visit Completed

## 2022-08-16 ENCOUNTER — Telehealth: Payer: Self-pay | Admitting: Internal Medicine

## 2022-08-16 ENCOUNTER — Other Ambulatory Visit: Payer: Self-pay | Admitting: Medical

## 2022-08-16 DIAGNOSIS — E1169 Type 2 diabetes mellitus with other specified complication: Secondary | ICD-10-CM

## 2022-08-16 DIAGNOSIS — Z79899 Other long term (current) drug therapy: Secondary | ICD-10-CM

## 2022-08-16 DIAGNOSIS — I10 Essential (primary) hypertension: Secondary | ICD-10-CM

## 2022-08-16 NOTE — Telephone Encounter (Signed)
Referred to Home health

## 2022-08-16 NOTE — Telephone Encounter (Signed)
Referral placed.

## 2022-08-16 NOTE — Addendum Note (Signed)
Addended by: Herminio Commons A on: 08/16/2022 12:15 PM   Modules accepted: Orders

## 2022-08-16 NOTE — Telephone Encounter (Signed)
-----   Message from Kristian Covey sent at 08/15/2022  9:50 AM EDT ----- Regarding: FW: patient requesting SNV for medication management Please put in referral as requested below ----- Message ----- From: Riley Churches, RN Sent: 08/13/2022   1:31 PM EDT To: Jac Canavan, PA-C Subject: patient requesting SNV for medication manage#  Loretta Plume,   I contacted Mrs. Franey today for an initial nurse care coordination follow up. She reported to me she is getting confused about how to take her medications due to having recent changes following her IP and ED events. She is requesting a SNV for medication management. She also has extra medication on hand. She feels if a nurse can home into her home to see what system she is using and help her determine what meds she can dispose of, it will be helpful and may help prevent her from taking too much or too little of one of her meds. Can you please place an order for this service?   Please let me know if you have additional questions or concerns!  Thanks, Delsa Sale, RN, BSN, CCM Care Management Coordinator [facility] Care Management  Direct Phone: [phone]

## 2022-08-19 ENCOUNTER — Telehealth: Payer: Self-pay | Admitting: *Deleted

## 2022-08-19 DIAGNOSIS — M5412 Radiculopathy, cervical region: Secondary | ICD-10-CM | POA: Diagnosis not present

## 2022-08-19 NOTE — Telephone Encounter (Signed)
   Pre-operative Risk Assessment    Patient Name: Sandra Ruiz  DOB: 1945/03/27 MRN: [id number]      Request for Surgical Clearance    Procedure:   CERVICAL EPIDURAL C6-C7   Date of Surgery:  Clearance TBD                                 Surgeon:  DR. DAVE [facility] Surgeon's Group or Practice Name:  [facility] Phone number:  [phone] Fax number:  [phone]   Type of Clearance Requested:   - Medical  - Pharmacy:  Hold Apixaban (Eliquis) x 3 DAYS PRIOR AND RESUME AFTER PROCEDURE   Type of Anesthesia:  Not Indicated   Additional requests/questions:    Elpidio Anis   08/19/2022, 5:44 PM

## 2022-08-19 NOTE — Progress Notes (Signed)
[facility] Summary Report / Loop Recorder 

## 2022-08-20 NOTE — Telephone Encounter (Signed)
Patient with diagnosis of atrial fibrillation on Eliquis for anticoagulation.    Procedure:   CERVICAL EPIDURAL C6-C7    Date of Surgery:  Clearance TBD   CHA2DS2-VASc Score = 8   This indicates a 10.8% annual risk of stroke. The patient's score is based upon: CHF History: 1 HTN History: 1 Diabetes History: 1 Stroke History: 2 Vascular Disease History: 1 Age Score: 1 Gender Score: 1   Chart notes that patient reports history of TIA prior to moving to [place] in 2020.    CrCl 71 Platelet count 321  Per office protocol, patient can hold Eliquis for 3 days prior to procedure.   Patient will not need bridging with Lovenox (enoxaparin) around procedure.  **This guidance is not considered finalized until pre-operative APP has relayed final recommendations.**

## 2022-08-21 ENCOUNTER — Other Ambulatory Visit: Payer: Self-pay | Admitting: Medical

## 2022-08-21 DIAGNOSIS — E7800 Pure hypercholesterolemia, unspecified: Secondary | ICD-10-CM

## 2022-08-23 ENCOUNTER — Ambulatory Visit: Payer: Self-pay

## 2022-08-23 NOTE — Patient Instructions (Signed)
Visit Information  Thank you for taking time to visit with me today. Please don't hesitate to contact me if I can be of assistance to you.   Following are the goals we discussed today:   Goals Addressed             This Visit's Progress    Improved medication management for Polypharmacy       Care Coordination Interventions: Patient interviewed about adult health maintenance status including  medication management  Determined PCP provider placed an order for a home health skilled nursing visit as requested Reviewed and discussed with patient the referral status is pending determination of a preferred in network provider who can service this patient Placed outbound call to [facility] who confirmed they have a [place] branch and availability to provide [facility] for this patient, the order can be faxed to [phone] Placed outbound call to referral coordinator for [facility], left detailed voice message for Clydie Braun requesting she fax the order to [facility] and provided the appropriate fax number, provided the contact number for this RN requesting a call back to confirm the order has been faxed         Our next appointment is by telephone on 09/02/22 at 10:00 AM  Please call the care guide team at [phone] if you need to cancel or reschedule your appointment.   If you are experiencing a Mental Health or Behavioral Health Crisis or need someone to talk to, please call 1-800-273-TALK (toll free, 24 hour hotline)  Patient verbalizes understanding of instructions and care plan provided today and agrees to view in MyChart. Active MyChart status and patient understanding of how to access instructions and care plan via MyChart confirmed with patient.     Delsa Sale, RN, BSN, CCM Care Management Coordinator [facility] Care Management  Direct Phone: [phone]

## 2022-08-23 NOTE — Patient Outreach (Signed)
  Care Coordination   Follow Up Visit Note   08/23/2022 Name: Sandra Ruiz MRN: [id number] DOB: June 14, 1945  Sandra Ruiz is a 77 y.o. year old female who sees Tysinger, Kermit Balo, PA-C for primary care. I spoke with  Ander Gaster by phone today.  What matters to the patients health and wellness today?  Patient would like to have a skilled nurse visit for review of medication management.     Goals Addressed             This Visit's Progress    Improved medication management for Polypharmacy       Care Coordination Interventions: Patient interviewed about adult health maintenance status including  medication management  Determined PCP provider placed an order for a home health skilled nursing visit as requested Reviewed and discussed with patient the referral status is pending determination of a preferred in network provider who can service this patient Placed outbound call to [facility] who confirmed they have a [place] branch and availability to provide [facility] for this patient, the order can be faxed to [phone] Placed outbound call to referral coordinator for [facility], left detailed voice message for Clydie Braun requesting she fax the order to [facility] and provided the appropriate fax number, provided the contact number for this RN requesting a call back to confirm the order has been faxed     Interventions Today    Flowsheet Row Most Recent Value  Chronic Disease   Chronic disease during today's visit Other  [cervical spinal pain]  General Interventions   General Interventions Discussed/Reviewed General Interventions Discussed, General Interventions Reviewed, Doctor Visits, Communication with  Doctor Visits Discussed/Reviewed Doctor Visits Discussed, Doctor Visits Reviewed, PCP, Specialist  PCP/Specialist Visits Contact provider for referral to  [[facility]]  Contacted provider for referral to PCP  [facility]]  Education Interventions    Education Provided Provided Education  Provided Verbal Education On When to see the doctor, Medication  Pharmacy Interventions   Pharmacy Dicussed/Reviewed Pharmacy Topics Discussed, Pharmacy Topics Reviewed, Medications and their functions          SDOH assessments and interventions completed:  No     Care Coordination Interventions:  Yes, provided   Follow up plan: Follow up call scheduled for 09/02/22 @10 :00 AM    Encounter Outcome:  Pt. Visit Completed

## 2022-08-24 ENCOUNTER — Telehealth: Payer: Self-pay

## 2022-08-24 NOTE — Telephone Encounter (Signed)
Attempted to contact patient to see if she would like to reschedule office visit on 08/26/22 with [facility] - still waiting on myoview lexiscan (had to cancel first appt for myoview)

## 2022-08-26 ENCOUNTER — Encounter (HOSPITAL_COMMUNITY): Payer: Medicare Other

## 2022-08-26 ENCOUNTER — Ambulatory Visit: Payer: Medicare Other | Admitting: Cardiovascular Disease

## 2022-08-31 ENCOUNTER — Telehealth: Payer: Self-pay | Admitting: *Deleted

## 2022-08-31 ENCOUNTER — Ambulatory Visit: Payer: Medicare Other | Attending: Cardiovascular Disease | Admitting: Cardiovascular Disease

## 2022-08-31 DIAGNOSIS — R112 Nausea with vomiting, unspecified: Secondary | ICD-10-CM | POA: Diagnosis not present

## 2022-08-31 DIAGNOSIS — D509 Iron deficiency anemia, unspecified: Secondary | ICD-10-CM | POA: Diagnosis not present

## 2022-08-31 DIAGNOSIS — R1013 Epigastric pain: Secondary | ICD-10-CM | POA: Diagnosis not present

## 2022-08-31 NOTE — Telephone Encounter (Signed)
Patient has an ILR - Linq 2 Loop Recorder device.  No concerns with this device - does not require clearance.   Forwarding back to general cardiology for clearance review.

## 2022-08-31 NOTE — Telephone Encounter (Signed)
   Pre-operative Risk Assessment    Patient Name: Sandra Ruiz  DOB: [birth year] MRN: [id number]      Request for Surgical Clearance    Procedure:   Colonoscopy/endoscopy  Date of Surgery:  Clearance 10/06/22                                 Surgeon:  Dr. Charlott Rakes Surgeon's Group or Practice Name:  [facility] Phone number:  [phone] Fax number:  [phone]   Type of Clearance Requested:   - Medical  - Pharmacy:  Hold Apixaban (Eliquis) Not Indicated   Type of Anesthesia:  Propofol   Additional requests/questions:  Patient has upcoming appointment with Dr. Nelly Laurence 08/31/2022.  Pre op added to appointment notes.   Signed, Emmit Pomfret   08/31/2022, 3:09 PM

## 2022-09-01 ENCOUNTER — Encounter: Payer: Self-pay | Admitting: Cardiovascular Disease

## 2022-09-01 NOTE — Telephone Encounter (Signed)
Patient with diagnosis of atrial fibrillation on Eliquis for anticoagulation.    Procedure:   Colonoscopy/endoscopy   Date of Surgery:  Clearance 10/06/22   CHA2DS2-VASc Score = 9   This indicates a 12.2% annual risk of stroke. The patient's score is based upon: CHF History: 1 HTN History: 1 Diabetes History: 1 Stroke History: 2 Vascular Disease History: 1 Age Score: 2 Gender Score: 1   Chart notes that TIA was prior to moving to [place] in 2021  CrCl 71 Platelet count 321  Per office protocol, patient can hold Eliquis for 1 days prior to procedure.   Patient will not need bridging with Lovenox (enoxaparin) around procedure.  **This guidance is not considered finalized until pre-operative APP has relayed final recommendations.**

## 2022-09-01 NOTE — Telephone Encounter (Signed)
 Please advise holding Eliquis prior to colonoscopy/EGD.  Thank you!  DW

## 2022-09-01 NOTE — Telephone Encounter (Signed)
 Tried to call the pt to set up tele pre op appt, but no answer.

## 2022-09-01 NOTE — Telephone Encounter (Signed)
   Name: Sandra Ruiz  DOB: 01-20-1945  MRN: [id number]  Primary Cardiologist: Olga Millers, MD   Preoperative team, please contact this patient and set up a phone call appointment for further preoperative risk assessment. Please obtain consent and complete medication review. Thank you for your help.  I confirm that guidance regarding antiplatelet and oral anticoagulation therapy has been completed and, if necessary, noted below.  Per Pharm D, patient may hold Eliquis for 1 days prior to procedure. She will not need bridging with Lovenox.    Carlos Levering, NP 09/01/2022, 1:32 PM [facility]

## 2022-09-01 NOTE — Telephone Encounter (Signed)
Merry Proud,  You saw this patient on 07/22/2022. I saw that you canceled her Lexiscan, as you felt her symptoms were related to anemia. Will you please comment on medical clearance for colonoscopy/EGD?  Please route your response to P CV DIV Preop. I will communicate with requesting office once you have given recommendations.   Thank you!  Carlos Levering, NP

## 2022-09-02 ENCOUNTER — Ambulatory Visit: Payer: Self-pay | Admitting: Licensed Clinical Social Worker

## 2022-09-02 NOTE — Patient Instructions (Signed)
Visit Information  Thank you for taking time to visit with me today. Please don't hesitate to contact me if I can be of assistance to you.   Following are the goals we discussed today:   Goals Addressed             This Visit's Progress    Obtain Supportive Resources-Financial   On track    Activities and task to complete in order to accomplish goals.   Keep all upcoming appointments discussed today Continue with compliance of taking medication prescribed by Doctor Implement healthy coping skills discussed to assist with management of symptoms Contact Care Credit with any questions/concerns         Our next appointment is by telephone on 09/26 at 10 AM  Please call the care guide team at [phone] if you need to cancel or reschedule your appointment.   If you are experiencing a Mental Health or Behavioral Health Crisis or need someone to talk to, please call the Suicide and Crisis Lifeline: 988 call 911   Patient verbalizes understanding of instructions and care plan provided today and agrees to view in MyChart. Active MyChart status and patient understanding of how to access instructions and care plan via MyChart confirmed with patient.     Jenel Lucks, MSW, LCSW [facility] Care Management [place]  [email or web address] Phone [phone] 12:54 PM

## 2022-09-02 NOTE — Patient Outreach (Signed)
  Care Coordination   Follow Up Visit Note   09/02/2022 Name: Sandra Ruiz MRN: [id number] DOB: Nov 30, 1945  Sandra Ruiz is a 77 y.o. year old female who sees Tysinger, Kermit Balo, PA-C for primary care. I spoke with  Ander Gaster by phone today.  What matters to the patients health and wellness today?  Symptom Management    Goals Addressed             This Visit's Progress    Obtain Supportive Resources-Financial   On track    Activities and task to complete in order to accomplish goals.   Keep all upcoming appointments discussed today Continue with compliance of taking medication prescribed by Doctor Implement healthy coping skills discussed to assist with management of symptoms Contact Care Credit with any questions/concerns         SDOH assessments and interventions completed:  No     Care Coordination Interventions:  Yes, provided  Interventions Today    Flowsheet Row Most Recent Value  Chronic Disease   Chronic disease during today's visit Hypertension (HTN), Diabetes, Atrial Fibrillation (AFib), Congestive Heart Failure (CHF), Other  [Chronic pain syndrome, depression, and anxiety]  General Interventions   General Interventions Discussed/Reviewed General Interventions Reviewed, Doctor Visits, Community Resources  Doctor Visits Discussed/Reviewed Doctor Visits Reviewed  Mental Health Interventions   Mental Health Discussed/Reviewed Mental Health Reviewed, Coping Strategies, Anxiety, Depression  Nutrition Interventions   Nutrition Discussed/Reviewed Nutrition Reviewed  Pharmacy Interventions   Pharmacy Dicussed/Reviewed Pharmacy Topics Reviewed, Medication Adherence  Safety Interventions   Safety Discussed/Reviewed Safety Reviewed       Follow up plan: Follow up call scheduled for 4-6 weeks    Encounter Outcome:  Pt. Visit Completed   Jenel Lucks, MSW, LCSW [facility] Care Management [facility]  [facility] [email or web address] Phone [phone] 10:30 AM

## 2022-09-03 ENCOUNTER — Telehealth: Payer: Self-pay | Admitting: *Deleted

## 2022-09-03 NOTE — Telephone Encounter (Signed)
Set pt up for telephone clearance.  Med rec and consent done. Phone # confirmed. Will remove from pre op pool.    Patient Consent for Virtual Visit         Sandra Ruiz has provided verbal consent on 09/03/2022 for a virtual visit (video or telephone).   CONSENT FOR VIRTUAL VISIT FOR:  Sandra Ruiz  By participating in this virtual visit I agree to the following:  I hereby voluntarily request, consent and authorize [facility] and its employed or contracted physicians, physician assistants, nurse practitioners or other licensed health care professionals (the Practitioner), to provide me with telemedicine health care services (the "Services") as deemed necessary by the treating Practitioner. I acknowledge and consent to receive the Services by the Practitioner via telemedicine. I understand that the telemedicine visit will involve communicating with the Practitioner through live audiovisual communication technology and the disclosure of certain medical information by electronic transmission. I acknowledge that I have been given the opportunity to request an in-person assessment or other available alternative prior to the telemedicine visit and am voluntarily participating in the telemedicine visit.  I understand that I have the right to withhold or withdraw my consent to the use of telemedicine in the course of my care at any time, without affecting my right to future care or treatment, and that the Practitioner or I may terminate the telemedicine visit at any time. I understand that I have the right to inspect all information obtained and/or recorded in the course of the telemedicine visit and may receive copies of available information for a reasonable fee.  I understand that some of the potential risks of receiving the Services via telemedicine include:  Delay or interruption in medical evaluation due to technological equipment failure or disruption; Information transmitted may not  be sufficient (e.g. poor resolution of images) to allow for appropriate medical decision making by the Practitioner; and/or  In rare instances, security protocols could fail, causing a breach of personal health information.  Furthermore, I acknowledge that it is my responsibility to provide information about my medical history, conditions and care that is complete and accurate to the best of my ability. I acknowledge that Practitioner's advice, recommendations, and/or decision may be based on factors not within their control, such as incomplete or inaccurate data provided by me or distortions of diagnostic images or specimens that may result from electronic transmissions. I understand that the practice of medicine is not an exact science and that Practitioner makes no warranties or guarantees regarding treatment outcomes. I acknowledge that a copy of this consent can be made available to me via my patient portal [facility] MyChart), or I can request a printed copy by calling the office of [facility].    I understand that my insurance will be billed for this visit.   I have read or had this consent read to me. I understand the contents of this consent, which adequately explains the benefits and risks of the Services being provided via telemedicine.  I have been provided ample opportunity to ask questions regarding this consent and the Services and have had my questions answered to my satisfaction. I give my informed consent for the services to be provided through the use of telemedicine in my medical care

## 2022-09-03 NOTE — Telephone Encounter (Signed)
Error

## 2022-09-05 LAB — CUP PACEART REMOTE DEVICE CHECK
Date Time Interrogation Session: 20240829231127
Implantable Pulse Generator Implant Date: 20231103

## 2022-09-07 ENCOUNTER — Ambulatory Visit (INDEPENDENT_AMBULATORY_CARE_PROVIDER_SITE_OTHER): Payer: Medicare Other

## 2022-09-07 DIAGNOSIS — I480 Paroxysmal atrial fibrillation: Secondary | ICD-10-CM | POA: Diagnosis not present

## 2022-09-13 ENCOUNTER — Ambulatory Visit: Payer: Medicare Other

## 2022-09-16 ENCOUNTER — Ambulatory Visit (HOSPITAL_COMMUNITY): Payer: Medicare Other | Admitting: Physician Assistant

## 2022-09-16 NOTE — Progress Notes (Signed)
[facility] Summary Report / Loop Recorder 

## 2022-09-22 NOTE — Assessment & Plan Note (Signed)
Microcytic iron deficiency anemia: [employer] Hospitalization 07/23/2022-07/28/2022: Admitted with hemoglobin of 5.8, after 6 doses of IV iron and 1 dose of Aranesp, hemoglobin improved to 9 g on 08/04/2022 08/09/2022: Hemoglobin 9.5 g (patient baseline hemoglobin is between 10 and 11)   Recommendation:  Recheck CBC and iron studies in 1 month Administer more IV iron if necessary Will consider Aranesp once again if needed   Prior history of EGD and colonoscopy negative, capsule endoscopy was also performed. Eliquis has been restarted.  Patient will discuss with her cardiologist if Eliquis can be stopped.   Return to clinic in 1 month with labs.

## 2022-09-23 ENCOUNTER — Ambulatory Visit (HOSPITAL_COMMUNITY)
Admission: RE | Admit: 2022-09-23 | Discharge: 2022-09-23 | Disposition: A | Discharge status: Home / Self Care | Payer: Medicare Other | Source: Ambulatory Visit | Attending: Physician Assistant | Admitting: Physician Assistant

## 2022-09-23 ENCOUNTER — Inpatient Hospital Stay: Payer: Medicare Other | Attending: Hematology and Oncology

## 2022-09-23 ENCOUNTER — Encounter (HOSPITAL_COMMUNITY): Payer: Self-pay | Admitting: Physician Assistant

## 2022-09-23 ENCOUNTER — Other Ambulatory Visit: Payer: Self-pay | Admitting: Medical

## 2022-09-23 ENCOUNTER — Inpatient Hospital Stay: Payer: Medicare Other | Admitting: Hematology and Oncology

## 2022-09-23 VITALS — BP 136/80 | HR 83 | Ht 61.0 in | Wt 152.6 lb

## 2022-09-23 VITALS — BP 148/84 | HR 85 | Temp 97.3°F | Resp 18 | Ht 61.0 in | Wt 152.7 lb

## 2022-09-23 DIAGNOSIS — Z7985 Long-term (current) use of injectable non-insulin antidiabetic drugs: Secondary | ICD-10-CM | POA: Insufficient documentation

## 2022-09-23 DIAGNOSIS — D649 Anemia, unspecified: Secondary | ICD-10-CM

## 2022-09-23 DIAGNOSIS — I110 Hypertensive heart disease with heart failure: Secondary | ICD-10-CM | POA: Insufficient documentation

## 2022-09-23 DIAGNOSIS — D509 Iron deficiency anemia, unspecified: Secondary | ICD-10-CM | POA: Diagnosis not present

## 2022-09-23 DIAGNOSIS — D6869 Other thrombophilia: Secondary | ICD-10-CM

## 2022-09-23 DIAGNOSIS — I4819 Other persistent atrial fibrillation: Secondary | ICD-10-CM | POA: Insufficient documentation

## 2022-09-23 DIAGNOSIS — E785 Hyperlipidemia, unspecified: Secondary | ICD-10-CM | POA: Insufficient documentation

## 2022-09-23 DIAGNOSIS — G4733 Obstructive sleep apnea (adult) (pediatric): Secondary | ICD-10-CM | POA: Insufficient documentation

## 2022-09-23 DIAGNOSIS — E119 Type 2 diabetes mellitus without complications: Secondary | ICD-10-CM | POA: Insufficient documentation

## 2022-09-23 DIAGNOSIS — I5032 Chronic diastolic (congestive) heart failure: Secondary | ICD-10-CM | POA: Insufficient documentation

## 2022-09-23 DIAGNOSIS — Z5181 Encounter for therapeutic drug level monitoring: Secondary | ICD-10-CM

## 2022-09-23 DIAGNOSIS — Z7901 Long term (current) use of anticoagulants: Secondary | ICD-10-CM | POA: Insufficient documentation

## 2022-09-23 DIAGNOSIS — Z79899 Other long term (current) drug therapy: Secondary | ICD-10-CM | POA: Insufficient documentation

## 2022-09-23 DIAGNOSIS — I2510 Atherosclerotic heart disease of native coronary artery without angina pectoris: Secondary | ICD-10-CM | POA: Insufficient documentation

## 2022-09-23 DIAGNOSIS — E538 Deficiency of other specified B group vitamins: Secondary | ICD-10-CM | POA: Diagnosis not present

## 2022-09-23 LAB — CBC WITH DIFFERENTIAL (CANCER CENTER ONLY)
Abs Immature Granulocytes: 0.01 10*3/uL (ref 0.00–0.07)
Basophils Absolute: 0.1 10*3/uL (ref 0.0–0.1)
Basophils Relative: 1 %
Eosinophils Absolute: 0.1 10*3/uL (ref 0.0–0.5)
Eosinophils Relative: 2 %
HCT: 36.5 % (ref 36.0–46.0)
Hemoglobin: 12.2 g/dL (ref 12.0–15.0)
Immature Granulocytes: 0 %
Lymphocytes Relative: 21 %
Lymphs Abs: 1.1 10*3/uL (ref 0.7–4.0)
MCH: 28.1 pg (ref 26.0–34.0)
MCHC: 33.4 g/dL (ref 30.0–36.0)
MCV: 84.1 fL (ref 80.0–100.0)
Monocytes Absolute: 0.4 10*3/uL (ref 0.1–1.0)
Monocytes Relative: 7 %
Neutro Abs: 3.8 10*3/uL (ref 1.7–7.7)
Neutrophils Relative %: 69 %
Platelet Count: 256 10*3/uL (ref 150–400)
RBC: 4.34 MIL/uL (ref 3.87–5.11)
RDW: 18.9 % — ABNORMAL HIGH (ref 11.5–15.5)
WBC Count: 5.4 10*3/uL (ref 4.0–10.5)
nRBC: 0 % (ref 0.0–0.2)

## 2022-09-23 LAB — IRON AND IRON BINDING CAPACITY (CC-WL,HP ONLY)
Iron: 85 ug/dL (ref 28–170)
Saturation Ratios: 29 % (ref 10.4–31.8)
TIBC: 297 ug/dL (ref 250–450)
UIBC: 212 ug/dL (ref 148–442)

## 2022-09-23 MED ORDER — DILTIAZEM HCL ER COATED BEADS 300 MG PO CP24: 300.0000 mg | ORAL_CAPSULE | Freq: Every day | ORAL | 2 refills | Status: DC

## 2022-09-23 NOTE — Progress Notes (Signed)
Patient Care Team: Tysinger, Kermit Balo, PA-C as PCP - General (Family Medicine) Jens Som Madolyn Frieze, MD as PCP - Cardiology (Cardiology) Mealor, Roberts Gaudy, MD as PCP - Electrophysiology (Cardiology) Verner Chol, [facility] (Inactive) as Pharmacist (Pharmacist)  DIAGNOSIS:  Encounter Diagnosis  Name Primary?   Symptomatic anemia Yes      CHIEF COMPLIANT: Follow-up of anemia of iron and B12 deficiencies  Discussed the use of AI scribe software for clinical note transcription with the patient, who gave verbal consent to proceed.  History of Present Illness   The patient, with a history of iron deficiency anemia and vitamin B12 deficiency, reports a return of fatigue and new onset leg pain. She had been feeling well after receiving six iron infusions and B12 supplementation in July. However, she has been feeling 'run down' again recently. She also reports pain in her upper legs. She has been taking oral iron and B12 supplements. She denies any side effects from the oral iron and B12 supplements.         ALLERGIES:  is allergic to atorvastatin, baclofen, crestor [rosuvastatin], metformin and related, verapamil, silicone, and wellbutrin [bupropion].  MEDICATIONS:  Current Outpatient Medications  Medication Sig Dispense Refill   ACCU-CHEK GUIDE test strip USE 1 TEST STRIP 1 TO 2 TIMES  DAILY 200 strip 2   Accu-Chek Softclix Lancets lancets Use as instructed 90 each 3   ALPRAZolam (XANAX) 0.25 MG tablet Take 1 tablet (0.25 mg total) by mouth 2 (two) times daily as needed for anxiety. 20 tablet 0   apixaban (ELIQUIS) 5 MG TABS tablet Take 1 tablet (5 mg total) by mouth 2 (two) times daily.     Artificial Tears ophthalmic solution Place 1 drop into both eyes 3 (three) times daily as needed (for dryness).     BENADRYL ALLERGY 25 MG tablet Take 25 mg by mouth every 6 (six) hours as needed for itching.     Blood Glucose Monitoring Suppl (ACCU-CHEK GUIDE ME) w/Device KIT Test 1-2 times daily 1  kit 0   cyanocobalamin (VITAMIN B12) 1000 MCG tablet Take 1 tablet (1,000 mcg total) by mouth daily. 90 tablet 0   diltiazem (CARDIZEM CD) 300 MG 24 hr capsule Take 1 capsule (300 mg total) by mouth daily. 30 capsule 0   diltiazem (CARDIZEM) 30 MG tablet TAKE 1 TABLET BY MOUTH EVERY 4 HOURS AS NEEDED FOR AFIB HEART RATE >100 AS LONG AS TOP BP >100 90 tablet 1   dronedarone (MULTAQ) 400 MG tablet Take 1 tablet (400 mg total) by mouth 2 (two) times daily with a meal. 180 tablet 2   DULoxetine (CYMBALTA) 60 MG capsule TAKE 1 CAPSULE BY MOUTH TWICE  DAILY 200 capsule 1   ezetimibe (ZETIA) 10 MG tablet TAKE 1 TABLET BY MOUTH DAILY (Patient taking differently: Take 10 mg by mouth daily.) 100 tablet 2   ferrous gluconate (FERGON) 324 MG tablet Take 1 tablet (324 mg total) by mouth 2 (two) times daily with a meal. 60 tablet 2   furosemide (LASIX) 20 MG tablet TAKE 0.5 TABLET BY MOUTH DAILY AS NEEDED SWELLING OR WEIGHT GAIN 90 tablet 0   gabapentin (NEURONTIN) 800 MG tablet TAKE 1 TABLET BY MOUTH 3 TIMES  DAILY 300 tablet 0   levothyroxine (SYNTHROID) 75 MCG tablet TAKE 1 TABLET BY MOUTH DAILY  BEFORE BREAKFAST 100 tablet 0   nitroGLYCERIN (NITROSTAT) 0.4 MG SL tablet DISSOLVE 1 TABLET UNDER THE  TONGUE EVERY 5 MINUTES AS NEEDED FOR CHEST PAIN.  MAX OF 3 TABLETS IN 15 MINUTES. CALL 911 IF PAIN  PERSISTS. (Patient taking differently: Place 0.4 mg under the tongue every 5 (five) minutes x 3 doses as needed for chest pain (CALL 9-1-1 IF PAIN PERSISTS).) 25 tablet 3   omeprazole (PRILOSEC) 40 MG capsule Take 40 mg by mouth daily.     oxybutynin (DITROPAN-XL) 10 MG 24 hr tablet TAKE 1 TABLET BY MOUTH TWICE  DAILY (Patient taking differently: Take 10 mg by mouth in the morning and at bedtime.) 200 tablet 2   pravastatin (PRAVACHOL) 40 MG tablet TAKE 1 TABLET BY MOUTH IN THE  EVENING 100 tablet 1   tirzepatide (MOUNJARO) 7.5 MG/0.5ML Pen Inject 7.5 mg into the skin once a week. 2 mL 1   TYLENOL 500 MG tablet Take  500-1,000 mg by mouth every 6 (six) hours as needed for mild pain or headache.     No current facility-administered medications for this visit.    PHYSICAL EXAMINATION: ECOG PERFORMANCE STATUS: 1 - Symptomatic but completely ambulatory  Vitals:   09/23/22 0932  BP: (!) 148/84  Pulse: 85  Resp: 18  Temp: (!) 97.3 F (36.3 C)  SpO2: 96%   Filed Weights   09/23/22 0932  Weight: 152 lb 11.2 oz (69.3 kg)    Physical Exam          (exam performed in the presence of a chaperone)  LABORATORY DATA:  I have reviewed the data as listed    Latest Ref Rng & Units 08/09/2022   12:41 PM 08/04/2022    9:57 AM 08/02/2022    6:20 PM  CMP  Glucose 70 - 99 mg/dL 96  79  88   BUN 8 - 23 mg/dL 10  9  11    Creatinine 0.44 - 1.00 mg/dL 1.19  1.47  8.29   Sodium 135 - 145 mmol/L 136  139  136   Potassium 3.5 - 5.1 mmol/L 4.1  4.8  3.6   Chloride 98 - 111 mmol/L 102  103  100   CO2 22 - 32 mmol/L 28  21  22    Calcium 8.9 - 10.3 mg/dL 9.0  9.2  8.8   Total Protein 6.5 - 8.1 g/dL 5.8     Total Bilirubin 0.3 - 1.2 mg/dL 0.4     Alkaline Phos 38 - 126 U/L 88     AST 15 - 41 U/L 32     ALT 0 - 44 U/L 16       Lab Results  Component Value Date   WBC 5.4 09/23/2022   HGB 12.2 09/23/2022   HCT 36.5 09/23/2022   MCV 84.1 09/23/2022   PLT 256 09/23/2022   NEUTROABS 3.8 09/23/2022    ASSESSMENT & PLAN:  Symptomatic anemia Microcytic iron deficiency anemia: [facility] Hospitalization 07/23/2022-07/28/2022: Admitted with hemoglobin of 5.8, after 6 doses of IV iron and 1 dose of Aranesp, hemoglobin improved to 9 g on 08/04/2022 08/09/2022: Hemoglobin 9.5 g (patient baseline hemoglobin is between 10 and 11) 09/23/2022: Hemoglobin 12.2   Recommendation:  Recheck CBC and iron studies in 1 month Administer more IV iron if necessary Will consider Aranesp once again if needed   Prior history of EGD and colonoscopy negative, capsule endoscopy was also performed. Eliquis has been restarted.     Assessment and Plan    Iron Deficiency Anemia Significant improvement in hemoglobin levels following IV iron therapy. Recent fatigue and leg pain reported, but iron saturation and hemoglobin levels are  currently satisfactory. Ferritin slightly elevated due to recent iron therapy, but not concerning for iron overload. -Discontinue oral iron supplementation due to adequate iron stores. -Repeat labs in three months to monitor hemoglobin and iron levels.  Vitamin B12 Deficiency Currently on oral supplementation, but efficacy is uncertain. -Once current supply is finished, switch to sublingual B12 supplementation for better absorption. -Check B12 levels at next lab visit in three months.          Orders Placed This Encounter  Procedures   CBC with Differential (Cancer Center Only)    Standing Status:   Future    Standing Expiration Date:   09/23/2023   Ferritin    Standing Status:   Future    Standing Expiration Date:   09/23/2023   Iron and Iron Binding Capacity ([facility] only)    Standing Status:   Future    Standing Expiration Date:   09/23/2023   Vitamin B12    Standing Status:   Future    Standing Expiration Date:   09/23/2023   The patient has a good understanding of the overall plan. she agrees with it. she will call with any problems that may develop before the next visit here. Total time spent: 30 mins including face to face time and time spent for planning, charting and co-ordination of care   Tamsen Meek, MD 09/23/22

## 2022-09-23 NOTE — Progress Notes (Addendum)
Primary Care Physician: Jac Canavan, PA-C Primary Cardiologist: Dr Jens Som Primary Electrophysiologist: Dr Nelly Laurence Referring Physician: Dr Kem Parkinson is a 77 y.o. female with a history of atrial fibrillation s/p ablation x 2 in [place], HLD, OSA, HTN, DM who presents for follow up in the [facility]. The patient was initially diagnosed with atrial fibrillation in 2019 and had two ablations that year before moving here from [place]. Patient is on Eliquis for a CHADS2VASC score of 7. DCCV was arranged for 02/14/20 but she presented in [place].  The device clinic received an alert for an ongoing afib episode starting 01/31/21. She had noted more fatigue and SOB at that time. There were no specific triggers that she could identify. Patient is s/p DCCV on 03/05/21.   Patient underwent ILR replacement on 11/06/21.   On follow up today, patient reports that she has done very well from a cardiac standpoint. Her diltiazem has been increased and she was started on Multaq. Her ILR shows 0.7% afib burden. She was admitted 07/2022 with severe anemia, fecal occult negative. She was given IV iron and her Hgb improved. She is pending a colonoscopy with [facility].   Today, she denies symptoms of chest pain, PND, syncope, bleeding, or neurologic sequela. The patient is tolerating medications without difficulties and is otherwise without complaint today.    Atrial Fibrillation Risk Factors:  she does have symptoms or diagnosis of sleep apnea. she does not have a history of rheumatic fever. she does not have a history of alcohol use. The patient does not have a history of early familial atrial fibrillation or other arrhythmias.   Atrial Fibrillation Management history:  Previous antiarrhythmic drugs: none Previous cardioversions: none Previous ablations: 2019 x 2 in [place] Anticoagulation history: Eliquis   Past Medical History:  Diagnosis Date   Chronic  pain    Depression    Diabetes mellitus without complication (HCC)    GERD (gastroesophageal reflux disease)    History of TIA (transient ischemic attack)    Hyperlipidemia    Hyperopia of both eyes with astigmatism and presbyopia 12/21/2019   Hypertension    Lyme disease    Paroxysmal atrial fibrillation (HCC)    Sleep apnea    Thyroid disease     Current Outpatient Medications  Medication Sig Dispense Refill   ACCU-CHEK GUIDE test strip USE 1 TEST STRIP 1 TO 2 TIMES  DAILY 200 strip 2   Accu-Chek Softclix Lancets lancets Use as instructed 90 each 3   ALPRAZolam (XANAX) 0.25 MG tablet Take 1 tablet (0.25 mg total) by mouth 2 (two) times daily as needed for anxiety. 20 tablet 0   apixaban (ELIQUIS) 5 MG TABS tablet Take 1 tablet (5 mg total) by mouth 2 (two) times daily.     Artificial Tears ophthalmic solution Place 1 drop into both eyes 3 (three) times daily as needed (for dryness).     BENADRYL ALLERGY 25 MG tablet Take 25 mg by mouth every 6 (six) hours as needed for itching.     Blood Glucose Monitoring Suppl (ACCU-CHEK GUIDE ME) w/Device KIT Test 1-2 times daily 1 kit 0   cyanocobalamin (VITAMIN B12) 1000 MCG tablet Take 1 tablet (1,000 mcg total) by mouth daily. 90 tablet 0   diltiazem (CARDIZEM CD) 300 MG 24 hr capsule Take 1 capsule (300 mg total) by mouth daily. 30 capsule 0   diltiazem (CARDIZEM) 30 MG tablet TAKE 1  TABLET BY MOUTH EVERY 4 HOURS AS NEEDED FOR AFIB HEART RATE >100 AS LONG AS TOP BP >100 90 tablet 1   dronedarone (MULTAQ) 400 MG tablet Take 1 tablet (400 mg total) by mouth 2 (two) times daily with a meal. 180 tablet 2   DULoxetine (CYMBALTA) 60 MG capsule TAKE 1 CAPSULE BY MOUTH TWICE  DAILY 200 capsule 1   ezetimibe (ZETIA) 10 MG tablet TAKE 1 TABLET BY MOUTH DAILY (Patient taking differently: Take 10 mg by mouth daily.) 100 tablet 2   ferrous gluconate (FERGON) 324 MG tablet Take 1 tablet (324 mg total) by mouth 2 (two) times daily with a meal. 60 tablet 2    furosemide (LASIX) 20 MG tablet TAKE 0.5 TABLET BY MOUTH DAILY AS NEEDED SWELLING OR WEIGHT GAIN 90 tablet 0   gabapentin (NEURONTIN) 800 MG tablet TAKE 1 TABLET BY MOUTH 3 TIMES  DAILY 300 tablet 0   levothyroxine (SYNTHROID) 75 MCG tablet TAKE 1 TABLET BY MOUTH DAILY  BEFORE BREAKFAST 100 tablet 0   nitroGLYCERIN (NITROSTAT) 0.4 MG SL tablet DISSOLVE 1 TABLET UNDER THE  TONGUE EVERY 5 MINUTES AS NEEDED FOR CHEST PAIN. MAX OF 3 TABLETS IN 15 MINUTES. CALL 911 IF PAIN  PERSISTS. (Patient taking differently: Place 0.4 mg under the tongue every 5 (five) minutes x 3 doses as needed for chest pain (CALL 9-1-1 IF PAIN PERSISTS).) 25 tablet 3   omeprazole (PRILOSEC) 40 MG capsule Take 40 mg by mouth daily.     oxybutynin (DITROPAN-XL) 10 MG 24 hr tablet TAKE 1 TABLET BY MOUTH TWICE  DAILY (Patient taking differently: Take 10 mg by mouth in the morning and at bedtime.) 200 tablet 2   pravastatin (PRAVACHOL) 40 MG tablet TAKE 1 TABLET BY MOUTH IN THE  EVENING 100 tablet 1   tirzepatide (MOUNJARO) 7.5 MG/0.5ML Pen Inject 7.5 mg into the skin once a week. 2 mL 1   TYLENOL 500 MG tablet Take 500-1,000 mg by mouth every 6 (six) hours as needed for mild pain or headache.     No current facility-administered medications for this encounter.    ROS- All systems are reviewed and negative except as per the HPI above.  Physical Exam: Vitals:   09/23/22 1051  BP: 136/80  Pulse: 83  Weight: 69.2 kg  Height: 5\' 1"  (1.549 m)    GEN: Well nourished, well developed in no acute distress NECK: No JVD; No carotid bruits CARDIAC: Regular rate and rhythm, no murmurs, rubs, gallops RESPIRATORY:  Clear to auscultation without rales, wheezing or rhonchi  ABDOMEN: Soft, non-tender, non-distended EXTREMITIES:  No edema; No deformity    Wt Readings from Last 3 Encounters:  09/23/22 69.2 kg  09/23/22 69.3 kg  08/09/22 68.4 kg    EKG today demonstrates  SR Vent. rate 83 BPM PR interval 170 ms QRS duration 88  ms QT/QTcB 382/448 ms  Echo 01/27/22 demonstrated  1. Left ventricular ejection fraction by 3D volume is 50 %. The left  ventricle has low normal function. The left ventricle has no regional wall  motion abnormalities. Left ventricular diastolic parameters are  indeterminate.   2. Right ventricular systolic function is normal. The right ventricular  size is normal. There is normal pulmonary artery systolic pressure.   3. The mitral valve is normal in structure. No evidence of mitral valve  regurgitation. No evidence of mitral stenosis. Moderate mitral annular  calcification.   4. The aortic valve is normal in structure. Aortic valve regurgitation is  not visualized. Aortic valve sclerosis/calcification is present, without  any evidence of aortic stenosis.   5. The inferior vena cava is normal in size with greater than 50%  respiratory variability, suggesting right atrial pressure of 3 mmHg.   Epic records are reviewed at length today  CHA2DS2-VASc Score = 9  The patient's score is based upon: CHF History: 1 HTN History: 1 Diabetes History: 1 Stroke History: 2 Vascular Disease History: 1 Age Score: 2 Gender Score: 1        ASSESSMENT AND PLAN: Persistent Atrial Fibrillation (ICD10:  I48.19) The patient's CHA2DS2-VASc score is 9, indicating a 12.2% annual risk of stroke.   S/p afib ablation in [place] x 2 2019. ILR replacement 11/06/21, shows 0.7% afib burden. Continue diltiazem 300 mg daily with 30 mg PRN q 4 hours for heart racing Continue Multaq 400 mg BID Continue Eliquis 5 mg BID She has done very well with lifestyle changes and has lost ~70 lbs.   Secondary Hypercoagulable State (ICD10:  D68.69) The patient is at significant risk for stroke/thromboembolism based upon her CHA2DS2-VASc Score of 9.  Continue Apixaban (Eliquis). If she continues to struggle with symptomatic anemia could consider Watchman.   OSA  Patient is not on oral device or CPAP.  HTN Stable on  current regimen  CAD Calcium score 111 No anginal symptoms  Chronic diastolic CHF Fluid status appears stable today.    Follow up with Dr Nelly Laurence as scheduled.    Jorja Loa PA-C [facility] [address] [place], [place] [address] [phone] 09/23/2022 11:19 AM

## 2022-09-23 NOTE — Progress Notes (Signed)
Virtual Visit via Telephone Note   Because of [name]'s co-morbid illnesses, she is at least at moderate risk for complications without adequate follow up.  This format is felt to be most appropriate for this patient at this time.  The patient did not have access to video technology/had technical difficulties with video requiring transitioning to audio format only (telephone).  All issues noted in this document were discussed and addressed.  No physical exam could be performed with this format.  Please refer to the patient's chart for her consent to telehealth for [facility].  Evaluation Performed:  Preoperative cardiovascular risk assessment _____________   Date:  09/23/2022   Patient ID:  Sandra Ruiz, DOB 22-Oct-1945, MRN [id number] Patient Location:  Home Provider location:   Office  Primary Care Provider:  Jac Canavan, PA-C Primary Cardiologist:  Olga Millers, MD  Chief Complaint / Patient Profile   77 y.o. y/o female with a h/o CAD, PAF on anticoagulation, chronic diastolic heart failure, hypertension, hyperlipidemia, OSA, T2DM, hypothyroidism who is pending colonoscopy/EGD by Dr. Bosie Clos and presents today for telephonic preoperative cardiovascular risk assessment.  History of Present Illness    Sandra Ruiz is a 77 y.o. female who presents via audio/video conferencing for a telehealth visit today.  Pt was last seen in cardiology clinic on 07/22/2022 by Toniann Fail, NP.  At that time Ander Gaster complained of episodes of palpitations, shortness of breath and chest discomfort with exertion. She was initially scheduled for a Lexiscan for further evaluation. This was later canceled, as it was felt patient's symptoms were driven by anemia. She was seen in the ED and given iron transfusion. Last CBC 09/23/2022 showed hemoglobin 12.2.  The patient is now pending procedure as outlined above. Since her last visit, she is doing well. Patient denies  shortness of breath, dyspnea on exertion, lower extremity edema, orthopnea or PND. No chest pain, pressure, or tightness. No palpitations.  She has started pool therapy 1 day a week and is working up to increase it to days a week. She is independent with ADLs and light household activities.   Past Medical History    Past Medical History:  Diagnosis Date   Chronic pain    Depression    Diabetes mellitus without complication (HCC)    GERD (gastroesophageal reflux disease)    History of TIA (transient ischemic attack)    Hyperlipidemia    Hyperopia of both eyes with astigmatism and presbyopia 12/21/2019   Hypertension    Lyme disease    Paroxysmal atrial fibrillation (HCC)    Sleep apnea    Thyroid disease    Past Surgical History:  Procedure Laterality Date   ATRIAL FIBRILLATION ABLATION     x2 in [place]   CARDIAC CATHETERIZATION     CARDIOVERSION N/A 03/05/2021   Procedure: CARDIOVERSION;  Surgeon: Meriam Sprague, MD;  Location: [facility] ENDOSCOPY;  Service: Cardiovascular;  Laterality: N/A;   CERVICAL SPINE SURGERY     GIVENS CAPSULE STUDY N/A 07/25/2022   Procedure: GIVENS CAPSULE STUDY;  Surgeon: Lynann Bologna, DO;  Location: [facility] ENDOSCOPY;  Service: Gastroenterology;  Laterality: N/A;   implantable loop recorder placement  11/07/2017   MDT LINQ1 implanted in [place] for afib management by Dr Thedore Mins   LOOP RECORDER INSERTION N/A 11/06/2021   Procedure: LOOP RECORDER INSERTION;  Surgeon: Maurice Small, MD;  Location: [facility] INVASIVE CV LAB;  Service: Cardiovascular;  Laterality: N/A;   LOOP RECORDER REMOVAL N/A  11/06/2021   Procedure: LOOP RECORDER REMOVAL;  Surgeon: Maurice Small, MD;  Location: [facility] INVASIVE CV LAB;  Service: Cardiovascular;  Laterality: N/A;   LUMBAR SPINE SURGERY     TONSILLECTOMY AND ADENOIDECTOMY      Allergies  Allergies  Allergen Reactions   Atorvastatin Other (See Comments)    Extreme myalgias   Baclofen Swelling   Crestor [Rosuvastatin] Other (See  Comments)    Extreme myalgias   Metformin And Related Nausea Only   Verapamil Swelling   Silicone Itching and Rash   Wellbutrin [Bupropion] Anxiety and Other (See Comments)    Hallucinations also    Home Medications    Prior to Admission medications   Medication Sig Start Date End Date Taking? Authorizing Provider  ACCU-CHEK GUIDE test strip USE 1 TEST STRIP 1 TO 2 TIMES  DAILY 09/23/22   Tysinger, Kermit Balo, PA-C  Accu-Chek Softclix Lancets lancets Use as instructed 07/21/22   Tysinger, Kermit Balo, PA-C  ALPRAZolam Prudy Feeler) 0.25 MG tablet Take 1 tablet (0.25 mg total) by mouth 2 (two) times daily as needed for anxiety. 06/11/21   Tysinger, Kermit Balo, PA-C  apixaban (ELIQUIS) 5 MG TABS tablet Take 1 tablet (5 mg total) by mouth 2 (two) times daily. 08/04/22   Hughie Closs, MD  Artificial Tears ophthalmic solution Place 1 drop into both eyes 3 (three) times daily as needed (for dryness).    [provider]  BENADRYL ALLERGY 25 MG tablet Take 25 mg by mouth every 6 (six) hours as needed for itching.    [provider]  Blood Glucose Monitoring Suppl (ACCU-CHEK GUIDE ME) w/Device KIT Test 1-2 times daily 05/14/22   Tysinger, Kermit Balo, PA-C  cyanocobalamin (VITAMIN B12) 1000 MCG tablet Take 1 tablet (1,000 mcg total) by mouth daily. 08/04/22   Tysinger, Kermit Balo, PA-C  diltiazem (CARDIZEM CD) 300 MG 24 hr capsule Take 1 capsule (300 mg total) by mouth daily. 09/23/22   Fenton, Clint R, PA  diltiazem (CARDIZEM) 30 MG tablet TAKE 1 TABLET BY MOUTH EVERY 4 HOURS AS NEEDED FOR AFIB HEART RATE >100 AS LONG AS TOP BP >100 04/02/22   Fenton, Clint R, PA  dronedarone (MULTAQ) 400 MG tablet Take 1 tablet (400 mg total) by mouth 2 (two) times daily with a meal. 04/29/22   Sheilah Pigeon, PA-C  DULoxetine (CYMBALTA) 60 MG capsule TAKE 1 CAPSULE BY MOUTH TWICE  DAILY 04/15/22   Tysinger, Kermit Balo, PA-C  ezetimibe (ZETIA) 10 MG tablet TAKE 1 TABLET BY MOUTH DAILY Patient taking differently: Take 10 mg by  mouth daily. 11/17/21   Tysinger, Kermit Balo, PA-C  ferrous gluconate (FERGON) 324 MG tablet Take 1 tablet (324 mg total) by mouth 2 (two) times daily with a meal. 08/04/22   Tysinger, Kermit Balo, PA-C  furosemide (LASIX) 20 MG tablet TAKE 0.5 TABLET BY MOUTH DAILY AS NEEDED SWELLING OR WEIGHT GAIN 09/14/21   Tysinger, Kermit Balo, PA-C  gabapentin (NEURONTIN) 800 MG tablet TAKE 1 TABLET BY MOUTH 3 TIMES  DAILY 07/27/22   Tysinger, Kermit Balo, PA-C  levothyroxine (SYNTHROID) 75 MCG tablet TAKE 1 TABLET BY MOUTH DAILY  BEFORE BREAKFAST 08/12/22   Tysinger, Kermit Balo, PA-C  nitroGLYCERIN (NITROSTAT) 0.4 MG SL tablet DISSOLVE 1 TABLET UNDER THE  TONGUE EVERY 5 MINUTES AS NEEDED FOR CHEST PAIN. MAX OF 3 TABLETS IN 15 MINUTES. CALL 911 IF PAIN  PERSISTS. Patient taking differently: Place 0.4 mg under the tongue every 5 (five) minutes x 3  doses as needed for chest pain (CALL 9-1-1 IF PAIN PERSISTS). 05/11/22   Lewayne Bunting, MD  omeprazole (PRILOSEC) 40 MG capsule Take 40 mg by mouth daily.    [provider]  oxybutynin (DITROPAN-XL) 10 MG 24 hr tablet TAKE 1 TABLET BY MOUTH TWICE  DAILY Patient taking differently: Take 10 mg by mouth in the morning and at bedtime. 11/17/21   Tysinger, Kermit Balo, PA-C  pravastatin (PRAVACHOL) 40 MG tablet TAKE 1 TABLET BY MOUTH IN THE  EVENING 08/23/22   Tysinger, Kermit Balo, PA-C  tirzepatide [facility]) 7.5 MG/0.5ML Pen Inject 7.5 mg into the skin once a week. 08/05/22   Tysinger, Kermit Balo, PA-C  TYLENOL 500 MG tablet Take 500-1,000 mg by mouth every 6 (six) hours as needed for mild pain or headache.    [provider]    Physical Exam    Vital Signs:  DAYAMI BLOMBERG does not have vital signs available for review today.  Given telephonic nature of communication, physical exam is limited. AAOx3. NAD. Normal affect.  Speech and respirations are unlabored.  Accessory Clinical Findings    None  Assessment & Plan    Primary Cardiologist: Olga Millers, MD  Preoperative  cardiovascular risk assessment. Colonoscopy/EGD by Dr. Bosie Clos on 10/06/2022.  Chart reviewed as part of pre-operative protocol coverage. According to the RCRI, patient has a 6.6% risk of MACE. Patient reports activity equivalent to 4.0 METS (doing pool therapy 1 day a week).   Given past medical history and time since last visit, based on ACC/AHA guidelines, BLAINE PASQUARELLI would be at acceptable risk for the planned procedure without further cardiovascular testing.   Patient was advised that if she develops new symptoms prior to surgery to contact our office to arrange a follow-up appointment.  she verbalized understanding.  Per Pharm D, patient may hold Eliquis for 1 days prior to procedure. Patient will not need bridging with Lovenox around procedure.    I will route this recommendation to the requesting party via Epic fax function.  Please call with questions.  Time:   Today, I have spent 5 minutes with the patient with telehealth technology discussing medical history, symptoms, and management plan.     Carlos Levering, NP  09/23/2022, 4:10 PM

## 2022-09-24 ENCOUNTER — Ambulatory Visit: Payer: Medicare Other | Attending: Cardiovascular Disease | Admitting: Student

## 2022-09-24 DIAGNOSIS — Z01810 Encounter for preprocedural cardiovascular examination: Secondary | ICD-10-CM | POA: Diagnosis not present

## 2022-09-29 ENCOUNTER — Ambulatory Visit (HOSPITAL_COMMUNITY): Payer: Medicare Other | Admitting: Physician Assistant

## 2022-09-30 ENCOUNTER — Ambulatory Visit: Payer: Self-pay | Admitting: Licensed Clinical Social Worker

## 2022-09-30 NOTE — Patient Outreach (Signed)
Care Coordination   Follow Up Visit Note   09/30/2022 Name: Sandra Ruiz MRN: [id number] DOB: 03-05-1945  Sandra Ruiz is a 77 y.o. year old female who sees Tysinger, Kermit Balo, PA-C for primary care. I spoke with  Sandra Ruiz by phone today.  What matters to the patients health and wellness today?  Symptom Management    Goals Addressed             This Visit's Progress    Obtain Supportive Resources-Financial   On track    Activities and task to complete in order to accomplish goals.   Keep all upcoming appointments discussed today Continue with compliance of taking medication prescribed by Doctor Implement healthy coping skills discussed to assist with management of symptoms F/up with pain management to discuss concerns         SDOH assessments and interventions completed:  No     Care Coordination Interventions:  Yes, provided   Follow up plan: Follow up call scheduled for 2-4 weeks    Encounter Outcome:  Patient Visit Completed   Sandra Ruiz, MSW, [facility] Ruiz [facility] Care Management [facility]  [facility] [email or web address] Phone [phone] 5:49 PM

## 2022-09-30 NOTE — Patient Instructions (Signed)
Visit Information  Thank you for taking time to visit with me today. Please don't hesitate to contact me if I can be of assistance to you.   Following are the goals we discussed today:   Goals Addressed             This Visit's Progress    Obtain Supportive Resources-Financial   On track    Activities and task to complete in order to accomplish goals.   Keep all upcoming appointments discussed today Continue with compliance of taking medication prescribed by Doctor Implement healthy coping skills discussed to assist with management of symptoms F/up with pain management to discuss concerns         Our next appointment is by telephone on 11/14   Please call the care guide team at [phone] if you need to cancel or reschedule your appointment.   If you are experiencing a Mental Health or Behavioral Health Crisis or need someone to talk to, please call the Suicide and Crisis Lifeline: 988 call 911   Patient verbalizes understanding of instructions and care plan provided today and agrees to view in MyChart. Active MyChart status and patient understanding of how to access instructions and care plan via MyChart confirmed with patient.     Jenel Lucks, MSW, LCSW [facility] Care Management [place]  [facility] [email or web address] Phone [phone] 5:50 PM

## 2022-10-01 ENCOUNTER — Encounter: Payer: Medicare Other | Admitting: Cardiovascular Disease

## 2022-10-02 ENCOUNTER — Other Ambulatory Visit: Payer: Self-pay | Admitting: Medical

## 2022-10-06 DIAGNOSIS — D124 Benign neoplasm of descending colon: Secondary | ICD-10-CM | POA: Diagnosis not present

## 2022-10-06 DIAGNOSIS — Z09 Encounter for follow-up examination after completed treatment for conditions other than malignant neoplasm: Secondary | ICD-10-CM | POA: Diagnosis not present

## 2022-10-06 DIAGNOSIS — Z860100 Personal history of colon polyps, unspecified: Secondary | ICD-10-CM | POA: Diagnosis not present

## 2022-10-06 DIAGNOSIS — R112 Nausea with vomiting, unspecified: Secondary | ICD-10-CM | POA: Diagnosis not present

## 2022-10-06 DIAGNOSIS — K648 Other hemorrhoids: Secondary | ICD-10-CM | POA: Diagnosis not present

## 2022-10-06 DIAGNOSIS — R1013 Epigastric pain: Secondary | ICD-10-CM | POA: Diagnosis not present

## 2022-10-06 DIAGNOSIS — D509 Iron deficiency anemia, unspecified: Secondary | ICD-10-CM | POA: Diagnosis not present

## 2022-10-06 DIAGNOSIS — K5730 Diverticulosis of large intestine without perforation or abscess without bleeding: Secondary | ICD-10-CM | POA: Diagnosis not present

## 2022-10-06 DIAGNOSIS — K2970 Gastritis, unspecified, without bleeding: Secondary | ICD-10-CM | POA: Diagnosis not present

## 2022-10-06 LAB — CUP PACEART REMOTE DEVICE CHECK
Date Time Interrogation Session: 20241001231156
Implantable Pulse Generator Implant Date: 20231103

## 2022-10-06 LAB — HM COLONOSCOPY

## 2022-10-08 ENCOUNTER — Ambulatory Visit: Payer: Medicare Other | Admitting: Internal Medicine

## 2022-10-08 ENCOUNTER — Other Ambulatory Visit: Payer: Self-pay | Admitting: Medical

## 2022-10-08 ENCOUNTER — Encounter: Payer: Self-pay | Admitting: Internal Medicine

## 2022-10-08 VITALS — BP 120/80 | HR 77 | Resp 20 | Ht 61.0 in | Wt 152.6 lb

## 2022-10-08 DIAGNOSIS — E042 Nontoxic multinodular goiter: Secondary | ICD-10-CM

## 2022-10-08 DIAGNOSIS — D124 Benign neoplasm of descending colon: Secondary | ICD-10-CM | POA: Diagnosis not present

## 2022-10-08 DIAGNOSIS — R1310 Dysphagia, unspecified: Secondary | ICD-10-CM | POA: Diagnosis not present

## 2022-10-08 DIAGNOSIS — K2970 Gastritis, unspecified, without bleeding: Secondary | ICD-10-CM | POA: Diagnosis not present

## 2022-10-08 DIAGNOSIS — E039 Hypothyroidism, unspecified: Secondary | ICD-10-CM | POA: Diagnosis not present

## 2022-10-08 LAB — T4, FREE: Free T4: 0.92 ng/dL (ref 0.60–1.60)

## 2022-10-08 LAB — TSH: TSH: 1.06 u[IU]/mL (ref 0.35–5.50)

## 2022-10-08 MED ORDER — LEVOTHYROXINE SODIUM 75 MCG PO TABS: ORAL_TABLET | ORAL | 3 refills | Status: DC

## 2022-10-08 NOTE — Patient Instructions (Addendum)
Please stop at the lab.  Please continue Levothyroxine 75 mcg daily.  Take the thyroid hormone every day, with water, at least 30 minutes before breakfast, separated by at least 4 hours from: - acid reflux medications - calcium - iron - multivitamins  I ordered a Barium swallow test.  You should have an endocrinology follow-up appointment in 1 year.

## 2022-10-08 NOTE — Progress Notes (Addendum)
Patient ID: Sandra Ruiz, female   DOB: 1945/07/02, 77 y.o.   MRN: [id number]  HPI  Sandra Ruiz is a 77 y.o.-year-old female, returning for follow-up for hypothyroidism and thyroid nodules.  She previously saw Dr. Everardo All, but last visit with me 1 year and 3 months ago.  Interim history: Patient returns after long absence.  She had multiple health issues since her last visit including an admission in 07/2022 for significant anemia, with a hemoglobin as low as 5.4.  Most recent hemoglobin is 12.2, however, on 09/23/2022 after taking iron(now off) and B12. She also has paroxysmal A-fib and is trying different treatments for this.  She developed leg swelling from diltiazem.  Pt. has been dx with hypothyroidism in 2002.  Pt is on levothyroxine 75 mcg daily, taken: - in am (4 am) - fasting - at least 2h from b'fast - stopped calcium at night  - stopped iron  - no multivitamins - + PPIs (omeprazole) 2h after LT4 >> moved at least 4 hours later - not on Biotin  I reviewed pt's thyroid tests: Lab Results  Component Value Date   TSH 3.232 08/02/2022   TSH 1.090 03/11/2022   TSH 0.81 09/02/2021   TSH 2.21 01/07/2021   TSH 2.010 02/04/2020   TSH 1.590 08/01/2019   FREET4 0.95 09/02/2021   FREET4 0.95 01/07/2021   T3FREE 3.2 01/07/2021   Antithyroid antibodies: No results found for: "THGAB" No components found for: "TPOAB"  She also has a history of thyroid nodules, dx'ed 2020:  Reviewed available imaging investigation reports: Thyroid U/S (05/14/2019): Solid, hypoechoic, isthmic nodule measuring 1.8 x 0.7 x 1.5 cm - TR4; other subcentimeter thyroid nodules did not meet criteria for biopsy or follow-up.  Bx of this nodule (11/08/2019): Atypia of unknown significance, scant cellularity; Afirma molecular marker: Benign Thyroid U/S (01/14/2021): The previously biopsied right isthmus TR 4 nodule is smaller measuring 1.3 x 0.6 x 0.6 cm, previously 1.8 x 0.7 x 1.5 cm.  Other subcentimeter  thyroid nodules not meeting criteria for biopsy or follow-up. Thyroid U/S (05/04/2021): 1 cm isthmic nodule, which was previously biopsied.  Otherwise, subcentimeter nodules not meeting criteria for biopsy or follow-up Upper endoscopy (01/29/2021): Gastritis, but no esophageal pathology.  Pt describes: - fatigue  Pt denies: - feeling nodules in neck - hoarseness  But she does have: - dysphagia -with both food, liquids, and pills  She has + FH of thyroid disorders: daughter born w/o a thyroid. No FH of thyroid cancer.   Pt. also has a history of paroxysmal atrial fibrillation, on Eliquis, status post ablation, HL, depression, chronic pain, DM2 -on Mounjaro.  Latest HbA1c levels reviewed: Lab Results  Component Value Date   HGBA1C 4.5 (L) 08/04/2022   HGBA1C 6.3 (A) 04/28/2022   HGBA1C 6.6 (A) 01/01/2022   HGBA1C 6.3 (A) 10/01/2021   HGBA1C 6.9 (H) 02/26/2021   ROS: + see HPI  Past Medical History:  Diagnosis Date   Chronic pain    Depression    Diabetes mellitus without complication (HCC)    GERD (gastroesophageal reflux disease)    History of TIA (transient ischemic attack)    Hyperlipidemia    Hyperopia of both eyes with astigmatism and presbyopia 12/21/2019   Hypertension    Lyme disease    Paroxysmal atrial fibrillation (HCC)    Sleep apnea    Thyroid disease    Past Surgical History:  Procedure Laterality Date   ATRIAL FIBRILLATION ABLATION     x2 in  [place]   CARDIAC CATHETERIZATION     CARDIOVERSION N/A 03/05/2021   Procedure: CARDIOVERSION;  Surgeon: Meriam Sprague, MD;  Location: [facility] ENDOSCOPY;  Service: Cardiovascular;  Laterality: N/A;   CERVICAL SPINE SURGERY     GIVENS CAPSULE STUDY N/A 07/25/2022   Procedure: GIVENS CAPSULE STUDY;  Surgeon: Lynann Bologna, DO;  Location: [facility] ENDOSCOPY;  Service: Gastroenterology;  Laterality: N/A;   implantable loop recorder placement  11/07/2017   MDT LINQ1 implanted in [place] for afib management by Dr Thedore Mins   LOOP  RECORDER INSERTION N/A 11/06/2021   Procedure: LOOP RECORDER INSERTION;  Surgeon: Maurice Small, MD;  Location: [facility] INVASIVE CV LAB;  Service: Cardiovascular;  Laterality: N/A;   LOOP RECORDER REMOVAL N/A 11/06/2021   Procedure: LOOP RECORDER REMOVAL;  Surgeon: Maurice Small, MD;  Location: [facility] INVASIVE CV LAB;  Service: Cardiovascular;  Laterality: N/A;   LUMBAR SPINE SURGERY     TONSILLECTOMY AND ADENOIDECTOMY     Social History   Socioeconomic History   Marital status: Married    Spouse name: Not on file   Number of children: 5   Years of education: Not on file   Highest education level: Some college, no degree  Occupational History   Not on file  Tobacco Use   Smoking status: Former   Smokeless tobacco: Never   Tobacco comments:    Former smoker 11/12/2020  Vaping Use   Vaping status: Never Used  Substance and Sexual Activity   Alcohol use: Yes    Alcohol/week: 2.0 standard drinks of alcohol    Types: 2 Standard drinks or equivalent per week    Comment: mix drinks occ.   Drug use: Never   Sexual activity: Not on file  Other Topics Concern   Not on file  Social History Narrative   From [address], married, [place] Witness, new from [place] to [place]  07/2019   Right handed   Some caffeine use    Lives with husband, daughter, and son-in-law   Social Determinants of Health   Financial Resource Strain: Low Risk  (10/30/2021)   Overall Financial Resource Strain (CARDIA)    Difficulty of Paying Living Expenses: Not hard at all  Food Insecurity: No Food Insecurity (07/30/2022)   Hunger Vital Sign    Worried About Running Out of Food in the Last Year: Never true    Ran Out of Food in the Last Year: Never true  Transportation Needs: No Transportation Needs (07/30/2022)   PRAPARE - Administrator, Civil Service (Medical): No    Lack of Transportation (Non-Medical): No  Physical Activity: Inactive (10/30/2021)   Exercise Vital Sign     Days of Exercise per Week: 0 days    Minutes of Exercise per Session: 0 min  Stress: Stress Concern Present (10/30/2021)   [employer] of Occupational Health - Occupational Stress Questionnaire    Feeling of Stress : To some extent  Social Connections: Not on file  Intimate Partner Violence: Not At Risk (07/23/2022)   Humiliation, Afraid, Rape, and Kick questionnaire    Fear of Current or Ex-Partner: No    Emotionally Abused: No    Physically Abused: No    Sexually Abused: No   Current Outpatient Medications on File Prior to Visit  Medication Sig Dispense Refill   ACCU-CHEK GUIDE test strip USE 1 TEST STRIP 1 TO 2 TIMES  DAILY 200 strip 2   Accu-Chek Softclix Lancets lancets Use as instructed  90 each 3   ALPRAZolam (XANAX) 0.25 MG tablet Take 1 tablet (0.25 mg total) by mouth 2 (two) times daily as needed for anxiety. 20 tablet 0   apixaban (ELIQUIS) 5 MG TABS tablet Take 1 tablet (5 mg total) by mouth 2 (two) times daily.     Artificial Tears ophthalmic solution Place 1 drop into both eyes 3 (three) times daily as needed (for dryness).     BENADRYL ALLERGY 25 MG tablet Take 25 mg by mouth every 6 (six) hours as needed for itching.     Blood Glucose Monitoring Suppl (ACCU-CHEK GUIDE ME) w/Device KIT Test 1-2 times daily 1 kit 0   cyanocobalamin (VITAMIN B12) 1000 MCG tablet Take 1 tablet (1,000 mcg total) by mouth daily. 90 tablet 0   diltiazem (CARDIZEM CD) 300 MG 24 hr capsule Take 1 capsule (300 mg total) by mouth daily. 90 capsule 2   diltiazem (CARDIZEM) 30 MG tablet TAKE 1 TABLET BY MOUTH EVERY 4 HOURS AS NEEDED FOR AFIB HEART RATE >100 AS LONG AS TOP BP >100 90 tablet 1   dronedarone (MULTAQ) 400 MG tablet Take 1 tablet (400 mg total) by mouth 2 (two) times daily with a meal. 180 tablet 2   DULoxetine (CYMBALTA) 60 MG capsule TAKE 1 CAPSULE BY MOUTH TWICE  DAILY 200 capsule 1   ezetimibe (ZETIA) 10 MG tablet TAKE 1 TABLET BY MOUTH DAILY (Patient taking differently: Take 10 mg  by mouth daily.) 100 tablet 2   ferrous gluconate (FERGON) 324 MG tablet Take 1 tablet (324 mg total) by mouth 2 (two) times daily with a meal. 60 tablet 2   furosemide (LASIX) 20 MG tablet TAKE 0.5 TABLET BY MOUTH DAILY AS NEEDED SWELLING OR WEIGHT GAIN 90 tablet 0   gabapentin (NEURONTIN) 800 MG tablet TAKE 1 TABLET BY MOUTH 3 TIMES  DAILY 300 tablet 0   levothyroxine (SYNTHROID) 75 MCG tablet TAKE 1 TABLET BY MOUTH DAILY  BEFORE BREAKFAST 100 tablet 0   nitroGLYCERIN (NITROSTAT) 0.4 MG SL tablet DISSOLVE 1 TABLET UNDER THE  TONGUE EVERY 5 MINUTES AS NEEDED FOR CHEST PAIN. MAX OF 3 TABLETS IN 15 MINUTES. CALL 911 IF PAIN  PERSISTS. (Patient taking differently: Place 0.4 mg under the tongue every 5 (five) minutes x 3 doses as needed for chest pain (CALL 9-1-1 IF PAIN PERSISTS).) 25 tablet 3   omeprazole (PRILOSEC) 40 MG capsule Take 40 mg by mouth daily.     oxybutynin (DITROPAN-XL) 10 MG 24 hr tablet TAKE 1 TABLET BY MOUTH TWICE  DAILY (Patient taking differently: Take 10 mg by mouth in the morning and at bedtime.) 200 tablet 2   pravastatin (PRAVACHOL) 40 MG tablet TAKE 1 TABLET BY MOUTH IN THE  EVENING 100 tablet 1   tirzepatide (MOUNJARO) 7.5 MG/0.5ML Pen Inject 7.5 mg into the skin once a week. 2 mL 1   TYLENOL 500 MG tablet Take 500-1,000 mg by mouth every 6 (six) hours as needed for mild pain or headache.     No current facility-administered medications on file prior to visit.   Allergies  Allergen Reactions   Atorvastatin Other (See Comments)    Extreme myalgias   Baclofen Swelling   Crestor [Rosuvastatin] Other (See Comments)    Extreme myalgias   Metformin And Related Nausea Only   Verapamil Swelling   Silicone Itching and Rash   Wellbutrin [Bupropion] Anxiety and Other (See Comments)    Hallucinations also   Family History  Problem Relation Age  of Onset   CAD Mother    Thyroid disease Mother    CAD Father    Thyroid disease Daughter    Hypothyroidism Daughter    PE: BP  120/80 (BP Location: Left Arm, Patient Position: Sitting, Cuff Size: Normal)   Pulse 77   Resp 20   Ht 5\' 1"  (1.549 m)   Wt 152 lb 9.6 oz (69.2 kg)   SpO2 97%   BMI 28.83 kg/m  Wt Readings from Last 25 Encounters:  10/08/22 152 lb 9.6 oz (69.2 kg)  09/23/22 152 lb 9.6 oz (69.2 kg)  09/23/22 152 lb 11.2 oz (69.3 kg)  08/09/22 150 lb 14.4 oz (68.4 kg)  08/04/22 149 lb (67.6 kg)  08/02/22 152 lb 1.9 oz (69 kg)  07/25/22 152 lb 5.4 oz (69.1 kg)  07/22/22 152 lb 6.4 oz (69.1 kg)  04/29/22 161 lb 9.6 oz (73.3 kg)  04/28/22 161 lb 6.4 oz (73.2 kg)  04/21/22 158 lb 3.2 oz (71.8 kg)  04/12/22 158 lb 0.2 oz (71.7 kg)  03/17/22 162 lb 12.8 oz (73.8 kg)  03/11/22 164 lb 9.6 oz (74.7 kg)  02/24/22 167 lb 6.4 oz (75.9 kg)  02/17/22 171 lb (77.6 kg)  01/01/22 174 lb 6.4 oz (79.1 kg)  12/24/21 178 lb 6.4 oz (80.9 kg)  11/20/21 184 lb 6.4 oz (83.6 kg)  11/06/21 186 lb (84.4 kg)  10/30/21 186 lb (84.4 kg)  10/26/21 187 lb (84.8 kg)  10/01/21 193 lb 3.2 oz (87.6 kg)  09/10/21 197 lb 9.6 oz (89.6 kg)  07/17/21 204 lb (92.5 kg)   Constitutional: normal weight, in NAD Eyes:  EOMI, no exophthalmos ENT: no neck masses, no cervical lymphadenopathy Cardiovascular: RRR, No MRG, + L>R LE edema Respiratory: CTA B Musculoskeletal: no deformities Skin:no rashes Neurological: no tremor with outstretched hands  ASSESSMENT: 1. Hypothyroidism  2.  Thyroid nodules  PLAN:  1. Patient with longstanding hypothyroidism on levothyroxine.   - latest thyroid labs reviewed with pt. >> normal: Lab Results  Component Value Date   TSH 3.232 08/02/2022  - she continues on LT4 75 mcg daily - pt feels good on this dose.  Since last visit, she lost 52 pounds - on [place]. - we discussed about taking the thyroid hormone every day, with water, >30 minutes before breakfast, separated by >4 hours from acid reflux medications, calcium, iron, multivitamins. Pt. is taking it correctly now.  At last visit she was  taking PPIs approximately 2 hours after LT4 and I advised her to move the omeprazole at lunchtime or later - will check thyroid tests today: TSH and fT4 - If labs are abnormal, she will need to return for repeat TFTs in 1.5 months -OTW, I will see her back in 1 year  2.  Thyroid nodules -Reviewing her thyroid ultrasound reports, the dominant nodule was medium sized initially (1.8 cm), solid/mostly solid, hypoechoic, and without other high risk features for cancer including microcalcifications, internal blood flow, taller than wide distribution and irregular contours.  She also does not have a family history of thyroid cancer or personal history of radiation therapy to head or neck, favoring benignity. -The dominant nodule was biopsied in 2021 initially with an indeterminant result, but the Afirma molecular marker returned benign.  We discussed that this confers a very low risk for cancer.  Moreover, the nodule significantly decreased in size since the initial investigation, from 1.8 to 1.0 cm. -We again discussed that no further investigation is needed for this  nodule.  She has several other subcentimeter nodules, for which no intervention or follow-up is needed -At last visit she mentions some dysphagia but this did not appear to be related to the thyroid.  An endoscopy did not show esophageal pathology, only upper stomach gastritis.   -At today's visit, she again mentions dysphagia with occasional vomiting.  She had an EGD last week (by Dr. Bosie Clos) that reportedly showed no pathology.  We discussed about the possibility of esophageal compression or esophageal dysmotility and decided to get a barium swallow to evaluate for this.  Orders Placed This Encounter  Procedures   DG ESOPHAGUS W SINGLE CM (SOL OR THIN BA)   TSH   T4, free   Needs refills.  Component     Latest Ref Rng 10/08/2022  TSH     0.35 - 5.50 uIU/mL 1.06   T4,Free(Direct)     0.60 - 1.60 ng/dL 8.75   TFTs normal - will  refill same LT4 dose.  Barium swallow (11/02/2022): TECHNIQUE: Combined double contrast and single contrast examination performed using effervescent crystals, thick barium liquid, and thin barium liquid. The patient was observed with fluoroscopy swallowing a 13 mm barium sulphate tablet.   FLUOROSCOPY: Radiation Exposure Index (as provided by the fluoroscopic device): 12.8 mGy Kerma   COMPARISON:  Chest CTA 09/06/2020.  Abdominopelvic CT 07/24/2022.   FINDINGS: The patient swallowed the barium without difficulty. Rapid sequence imaging of the pharynx in the AP and lateral projections demonstrates no laryngeal penetration or mucosal abnormalities. Previous C5-6 ACDF noted.   There is moderate esophageal dysmotility with a decreased primary stripping wave moderate tertiary contractions. There is no evidence of esophageal mass, stricture or ulceration. No significant hiatal hernia. Moderate gastroesophageal reflux was elicited with the water siphon test (to the level of the aortic arch).   At the conclusion of the study, a 13 mm barium tablet was administered. This passed without delay into the stomach.   IMPRESSION: 1. Moderate esophageal dysmotility. 2. Moderate gastroesophageal reflux. 3. No evidence of esophageal mass, stricture or ulceration.   No evidence of thyroid compression on the esophagus.  She does have moderate GERD and esophageal dysmotility.  Further management per GI.  Carlus Pavlov, MD PhD [facility] Endocrinology

## 2022-10-10 ENCOUNTER — Other Ambulatory Visit: Payer: Self-pay | Admitting: Medical

## 2022-10-11 ENCOUNTER — Ambulatory Visit (INDEPENDENT_AMBULATORY_CARE_PROVIDER_SITE_OTHER): Payer: Medicare Other

## 2022-10-11 ENCOUNTER — Ambulatory Visit: Payer: Medicare Other

## 2022-10-11 DIAGNOSIS — I4819 Other persistent atrial fibrillation: Secondary | ICD-10-CM

## 2022-10-12 ENCOUNTER — Encounter: Payer: Self-pay | Admitting: Internal Medicine

## 2022-10-12 ENCOUNTER — Other Ambulatory Visit: Payer: Self-pay | Admitting: Physician Assistant

## 2022-10-12 ENCOUNTER — Ambulatory Visit: Payer: Medicare Other

## 2022-10-12 ENCOUNTER — Telehealth: Payer: Self-pay | Admitting: *Deleted

## 2022-10-12 DIAGNOSIS — Z0000 Encounter for general adult medical examination without abnormal findings: Secondary | ICD-10-CM

## 2022-10-12 NOTE — Patient Instructions (Addendum)
Sandra Ruiz , Thank you for taking time to come for your Medicare Wellness Visit. I appreciate your ongoing commitment to your health goals. Please review the following plan we discussed and let me know if I can assist you in the future.   Referrals/Orders/Follow-Ups/Clinician Recommendations: none  This is a list of the screening recommended for you and due dates:  Health Maintenance  Topic Date Due   DTaP/Tdap/Td vaccine (1 - Tdap) Never done   Zoster (Shingles) Vaccine (1 of 2) Never done   Pneumonia Vaccine (2 of 2 - PPSV23 or PCV20) 08/26/2021   Eye exam for diabetics  03/14/2022   Flu Shot  08/05/2022   COVID-19 Vaccine (3 - 2023-24 season) 09/05/2022   Hemoglobin A1C  02/04/2023   Complete foot exam   04/28/2023   Yearly kidney health urinalysis for diabetes  08/04/2023   Yearly kidney function blood test for diabetes  08/09/2023   Medicare Annual Wellness Visit  10/12/2023   Colon Cancer Screening  01/30/2031   DEXA scan (bone density measurement)  Completed   Hepatitis C Screening  Completed   HPV Vaccine  Aged Out    Advanced directives: (In Chart) A copy of your advanced directives are scanned into your chart should your provider ever need it.  Next Medicare Annual Wellness Visit scheduled for next year: Yes  Insert Preventive Care attachment Insert FALL PREVENTION attachment if needed

## 2022-10-12 NOTE — Progress Notes (Unsigned)
  Care Coordination  Outreach Note  10/12/2022 Name: Sandra Ruiz MRN: [id number] DOB: 1945-03-01   Care Coordination Outreach Attempts: An unsuccessful telephone outreach was attempted today to offer the patient information about available care coordination services.  Follow Up Plan:  Additional outreach attempts will be made to offer the patient care coordination information and services.   Encounter Outcome:  No Answer Gwenevere Ghazi  Care Coordination Care Guide  Direct Dial: [phone]

## 2022-10-12 NOTE — Progress Notes (Signed)
Subjective:   Sandra Ruiz is a 77 y.o. female who presents for Medicare Annual (Subsequent) preventive examination.  Visit Complete: Virtual I connected with  Ander Gaster on 10/12/22 by a audio enabled telemedicine application and verified that I am speaking with the correct person using two identifiers.  Patient Location: Home  Provider Location: Office/Clinic  I discussed the limitations of evaluation and management by telemedicine. The patient expressed understanding and agreed to proceed.  Vital Signs: Because this visit was a virtual/telehealth visit, some criteria may be missing or patient reported. Any vitals not documented were not able to be obtained and vitals that have been documented are patient reported.    Cardiac Risk Factors include: advanced age (>47men, >56 women);diabetes mellitus;dyslipidemia;hypertension     Objective:    Today's Vitals   10/12/22 0856  PainSc: 6    There is no height or weight on file to calculate BMI.     10/12/2022    9:11 AM 09/23/2022    9:38 AM 08/09/2022    1:19 PM 08/02/2022    5:22 PM 07/23/2022   10:00 PM 11/06/2021    9:59 AM 10/30/2021    3:07 PM  Advanced Directives  Does Patient Have a Medical Advance Directive? Yes No No No No Yes Yes  Type of Estate agent of [place];Living will Living will Living will   Living will;Healthcare Power of [employer] Power of [place];Living will  Does patient want to make changes to medical advance directive?   No - Patient declined   No - Patient declined   Copy of Healthcare Power of Attorney in Chart? Yes - validated most recent copy scanned in chart (See row information)     No - copy requested Yes - validated most recent copy scanned in chart (See row information)  Would patient like information on creating a medical advance directive?  No - Patient declined No - Patient declined  No - Patient declined      Current Medications (verified) Outpatient  Encounter Medications as of 10/12/2022  Medication Sig   ACCU-CHEK GUIDE test strip USE 1 TEST STRIP 1 TO 2 TIMES  DAILY   Accu-Chek Softclix Lancets lancets Use as instructed   ALPRAZolam (XANAX) 0.25 MG tablet Take 1 tablet (0.25 mg total) by mouth 2 (two) times daily as needed for anxiety.   Artificial Tears ophthalmic solution Place 1 drop into both eyes 3 (three) times daily as needed (for dryness).   BENADRYL ALLERGY 25 MG tablet Take 25 mg by mouth every 6 (six) hours as needed for itching.   Blood Glucose Monitoring Suppl (ACCU-CHEK GUIDE ME) w/Device KIT Test 1-2 times daily   cyanocobalamin (VITAMIN B12) 1000 MCG tablet Take 1 tablet (1,000 mcg total) by mouth daily.   diltiazem (CARDIZEM CD) 300 MG 24 hr capsule Take 1 capsule (300 mg total) by mouth daily.   diltiazem (CARDIZEM) 30 MG tablet TAKE 1 TABLET BY MOUTH EVERY 4 HOURS AS NEEDED FOR AFIB HEART RATE >100 AS LONG AS TOP BP >100   dronedarone (MULTAQ) 400 MG tablet Take 1 tablet (400 mg total) by mouth 2 (two) times daily with a meal.   DULoxetine (CYMBALTA) 60 MG capsule TAKE 1 CAPSULE BY MOUTH TWICE  DAILY   ELIQUIS 5 MG TABS tablet TAKE 1 TABLET BY MOUTH TWICE  DAILY   ezetimibe (ZETIA) 10 MG tablet Take 1 tablet (10 mg total) by mouth daily.   furosemide (LASIX) 20 MG tablet TAKE  0.5 TABLET BY MOUTH DAILY AS NEEDED SWELLING OR WEIGHT GAIN   gabapentin (NEURONTIN) 800 MG tablet TAKE 1 TABLET BY MOUTH 3 TIMES  DAILY   levothyroxine (SYNTHROID) 75 MCG tablet TAKE 1 TABLET BY MOUTH DAILY  BEFORE BREAKFAST   nitroGLYCERIN (NITROSTAT) 0.4 MG SL tablet DISSOLVE 1 TABLET UNDER THE  TONGUE EVERY 5 MINUTES AS NEEDED FOR CHEST PAIN. MAX OF 3 TABLETS IN 15 MINUTES. CALL 911 IF PAIN  PERSISTS. (Patient taking differently: Place 0.4 mg under the tongue every 5 (five) minutes x 3 doses as needed for chest pain (CALL 9-1-1 IF PAIN PERSISTS).)   omeprazole (PRILOSEC) 40 MG capsule Take 40 mg by mouth daily.   oxybutynin (DITROPAN-XL) 10 MG 24  hr tablet TAKE 1 TABLET BY MOUTH TWICE  DAILY   pravastatin (PRAVACHOL) 40 MG tablet TAKE 1 TABLET BY MOUTH IN THE  EVENING   tirzepatide (MOUNJARO) 7.5 MG/0.5ML Pen Inject 7.5 mg into the skin once a week.   TYLENOL 500 MG tablet Take 500-1,000 mg by mouth every 6 (six) hours as needed for mild pain or headache.   ferrous gluconate (FERGON) 324 MG tablet Take 1 tablet (324 mg total) by mouth 2 (two) times daily with a meal. (Patient not taking: Reported on 10/12/2022)   No facility-administered encounter medications on file as of 10/12/2022.    Allergies (verified) Atorvastatin, Baclofen, Crestor [rosuvastatin], Metformin and related, Verapamil, Silicone, and Wellbutrin [bupropion]   History: Past Medical History:  Diagnosis Date   Chronic pain    Depression    Diabetes mellitus without complication (HCC)    GERD (gastroesophageal reflux disease)    History of TIA (transient ischemic attack)    Hyperlipidemia    Hyperopia of both eyes with astigmatism and presbyopia 12/21/2019   Hypertension    Lyme disease    Paroxysmal atrial fibrillation (HCC)    Sleep apnea    Thyroid disease    Past Surgical History:  Procedure Laterality Date   ATRIAL FIBRILLATION ABLATION     x2 in [place]   CARDIAC CATHETERIZATION     CARDIOVERSION N/A 03/05/2021   Procedure: CARDIOVERSION;  Surgeon: Meriam Sprague, MD;  Location[facility];  Service: Cardiovascular;  Laterality: N/A;   CERVICAL SPINE SURGERY     GIVENS CAPSULE STUDY N/A 07/25/2022   Procedure: GIVENS CAPSULE STUDY;  Surgeon: Lynann Bologna, DO;  Location: [facility] ENDOSCOPY;  Service: Gastroenterology;  Laterality: N/A;   implantable loop recorder placement  11/07/2017   MDT LINQ1 implanted in [place] for afib management by Dr Thedore Mins   LOOP RECORDER INSERTION N/A 11/06/2021   Procedure: LOOP RECORDER INSERTION;  Surgeon: Maurice Small, MD;  Location: [facility] INVASIVE CV LAB;  Service: Cardiovascular;  Laterality: N/A;   LOOP RECORDER REMOVAL  N/A 11/06/2021   Procedure: LOOP RECORDER REMOVAL;  Surgeon: Maurice Small, MD;  Location: [facility] INVASIVE CV LAB;  Service: Cardiovascular;  Laterality: N/A;   LUMBAR SPINE SURGERY     TONSILLECTOMY AND ADENOIDECTOMY     Family History  Problem Relation Age of Onset   CAD Mother    Thyroid disease Mother    CAD Father    Thyroid disease Daughter    Hypothyroidism Daughter    Social History   Socioeconomic History   Marital status: Married    Spouse name: Not on file   Number of children: 5   Years of education: Not on file   Highest education level: Some college, no degree  Occupational History  Not on file  Tobacco Use   Smoking status: Former   Smokeless tobacco: Never   Tobacco comments:    Former smoker 11/12/2020  Vaping Use   Vaping status: Never Used  Substance and Sexual Activity   Alcohol use: Yes    Alcohol/week: 2.0 standard drinks of alcohol    Types: 2 Standard drinks or equivalent per week    Comment: mix drinks occ.   Drug use: Never   Sexual activity: Not on file  Other Topics Concern   Not on file  Social History Narrative   From [address], married, [place] Witness, new from [place] to [place]  07/2019   Right handed   Some caffeine use    Lives with husband, daughter, and son-in-law   Social Determinants of Health   Financial Resource Strain: Low Risk  (10/12/2022)   Overall Financial Resource Strain (CARDIA)    Difficulty of Paying Living Expenses: Not hard at all  Food Insecurity: Food Insecurity Present (10/12/2022)   Hunger Vital Sign    Worried About Running Out of Food in the Last Year: Sometimes true    Ran Out of Food in the Last Year: Sometimes true  Transportation Needs: No Transportation Needs (10/12/2022)   PRAPARE - Administrator, Civil Service (Medical): No    Lack of Transportation (Non-Medical): No  Physical Activity: Sufficiently Active (10/12/2022)   Exercise Vital Sign    Days of Exercise per  Week: 2 days    Minutes of Exercise per Session: 120 min  Stress: Stress Concern Present (10/12/2022)   [employer] of Occupational Health - Occupational Stress Questionnaire    Feeling of Stress : To some extent  Social Connections: Moderately Isolated (10/12/2022)   Social Connection and Isolation Panel [NHANES]    Frequency of Communication with Friends and Family: Once a week    Frequency of Social Gatherings with Friends and Family: Never    Attends Religious Services: More than 4 times per year    Active Member of [employer] or Organizations: No    Attends Banker Meetings: Never    Marital Status: Married    Tobacco Counseling Counseling given: Not Answered Tobacco comments: Former smoker 11/12/2020   Clinical Intake:  Pre-visit preparation completed: Yes  Pain : 0-10 Pain Score: 6  Pain Type: Chronic pain Pain Location: Back Pain Orientation: Lower Pain Descriptors / Indicators: Aching Pain Onset: More than a month ago Pain Frequency: Constant     Nutritional Risks: Nausea/ vomitting/ diarrhea (nausea due to monjauro) Diabetes: Yes CBG done?: No Did pt. bring in CBG monitor from home?: No  How often do you need to have someone help you when you read instructions, pamphlets, or other written materials from your doctor or pharmacy?: 1 - Never  Interpreter Needed?: No  Information entered by :: NAllen LPN   Activities of Daily Living    10/12/2022    8:59 AM 07/23/2022   10:00 PM  In your present state of health, do you have any difficulty performing the following activities:  Hearing? 0   Vision? 1   Comment blurry vision at times   Difficulty concentrating or making decisions? 1   Comment due to stress   Walking or climbing stairs? 1   Dressing or bathing? 0   Doing errands, shopping? 0 1  Preparing Food and eating ? N   Using the Toilet? N   In the past six months, have you accidently leaked  urine? Y   Comment on oxybutnin   Do you  have problems with loss of bowel control? N   Managing your Medications? N   Managing your Finances? N   Housekeeping or managing your Housekeeping? N     Patient Care Team: Tysinger, Kermit Balo, PA-C as PCP - General (Family Medicine) Jens Som Madolyn Frieze, MD as PCP - Cardiology (Cardiology) Mealor, Roberts Gaudy, MD as PCP - Electrophysiology (Cardiology) Verner Chol, [facility] (Inactive) as Pharmacist (Pharmacist)  Indicate any recent Medical Services you may have received from other than [facility] providers in the past year (date may be approximate).     Assessment:   This is a routine wellness examination for [place].  Hearing/Vision screen Hearing Screening - Comments:: Denies hearing issues Vision Screening - Comments:: Regular eye exams, [facility]   Goals Addressed             This Visit's Progress    Patient Stated       10/12/2022, wants to lose more weight and start driving again       Depression Screen    10/12/2022    9:14 AM 10/30/2021    3:11 PM 10/01/2021   11:29 AM 04/08/2021    3:36 PM 11/26/2020   10:34 AM 08/26/2020   10:25 AM 05/01/2020    9:42 AM  PHQ 2/9 Scores  PHQ - 2 Score 1 0 1 2 2 2 6   PHQ- 9 Score 5 2 2 13 13 14 21     Fall Risk    10/12/2022    9:13 AM 10/30/2021    3:08 PM 10/01/2021   11:29 AM 04/08/2021    3:35 PM 11/26/2020   10:34 AM  Fall Risk   Falls in the past year? 1 1 1 1 1   Comment fell out of bed fell out of bed     Number falls in past yr: 0 0 0 0 1  Injury with Fall? 0 0 0 0 0  Risk for fall due to : Medication side effect;History of fall(s) Impaired balance/gait;Impaired mobility;Medication side effect Impaired balance/gait Impaired balance/gait Impaired balance/gait  Follow up Falls prevention discussed;Falls evaluation completed Falls evaluation completed;Education provided;Falls prevention discussed Falls evaluation completed Falls evaluation completed Falls evaluation completed    MEDICARE RISK AT  HOME: Medicare Risk at Home Any stairs in or around the home?: No If so, are there any without handrails?: No Home free of loose throw rugs in walkways, pet beds, electrical cords, etc?: Yes Adequate lighting in your home to reduce risk of falls?: Yes Life alert?: No Use of a cane, walker or w/c?: No Grab bars in the bathroom?: Yes Shower chair or bench in shower?: Yes Elevated toilet seat or a handicapped toilet?: No  TIMED UP AND GO:  Was the test performed?  No    Cognitive Function:        10/12/2022    9:19 AM 10/30/2021    3:15 PM  6CIT Screen  What Year? 0 points 0 points  What month? 0 points 0 points  What time? 0 points 3 points  Count back from 20 0 points 0 points  Months in reverse 0 points 0 points  Repeat phrase 2 points 2 points  Total Score 2 points 5 points    Immunizations Immunization History  Administered Date(s) Administered   Fluad Quad(high Dose 65+) 11/01/2019, 09/18/2020, 10/01/2021   Janssen (J&J) SARS-COV-2 Vaccination 03/18/2019   Moderna Sars-Covid-2 Vaccination 11/01/2019  Pneumococcal Conjugate-13 08/26/2020    TDAP status: Due, Education has been provided regarding the importance of this vaccine. Advised may receive this vaccine at local pharmacy or Health Dept. Aware to provide a copy of the vaccination record if obtained from local pharmacy or Health Dept. Verbalized acceptance and understanding.  Flu Vaccine status: Due, Education has been provided regarding the importance of this vaccine. Advised may receive this vaccine at local pharmacy or Health Dept. Aware to provide a copy of the vaccination record if obtained from local pharmacy or Health Dept. Verbalized acceptance and understanding.  Pneumococcal vaccine status: Due, Education has been provided regarding the importance of this vaccine. Advised may receive this vaccine at local pharmacy or Health Dept. Aware to provide a copy of the vaccination record if obtained from local  pharmacy or Health Dept. Verbalized acceptance and understanding.  Covid-19 vaccine status: Information provided on how to obtain vaccines.   Qualifies for Shingles Vaccine? Yes   Zostavax completed No   Shingrix Completed?: No.    Education has been provided regarding the importance of this vaccine. Patient has been advised to call insurance company to determine out of pocket expense if they have not yet received this vaccine. Advised may also receive vaccine at local pharmacy or Health Dept. Verbalized acceptance and understanding.  Screening Tests Health Maintenance  Topic Date Due   DTaP/Tdap/Td (1 - Tdap) Never done   Zoster Vaccines- Shingrix (1 of 2) Never done   Pneumonia Vaccine 42+ Years old (2 of 2 - PPSV23 or PCV20) 08/26/2021   OPHTHALMOLOGY EXAM  03/14/2022   INFLUENZA VACCINE  08/05/2022   COVID-19 Vaccine (3 - 2023-24 season) 09/05/2022   HEMOGLOBIN A1C  02/04/2023   FOOT EXAM  04/28/2023   Diabetic kidney evaluation - Urine ACR  08/04/2023   Diabetic kidney evaluation - eGFR measurement  08/09/2023   Medicare Annual Wellness (AWV)  10/12/2023   Colonoscopy  01/30/2031   DEXA SCAN  Completed   Hepatitis C Screening  Completed   HPV VACCINES  Aged Out    Health Maintenance  Health Maintenance Due  Topic Date Due   DTaP/Tdap/Td (1 - Tdap) Never done   Zoster Vaccines- Shingrix (1 of 2) Never done   Pneumonia Vaccine 61+ Years old (2 of 2 - PPSV23 or PCV20) 08/26/2021   OPHTHALMOLOGY EXAM  03/14/2022   INFLUENZA VACCINE  08/05/2022   COVID-19 Vaccine (3 - 2023-24 season) 09/05/2022    Colorectal cancer screening: Type of screening: Colonoscopy. Completed 01/29/2021. Repeat every 10 years  Mammogram status: Completed 06/17/2021. Repeat every year  Bone Density status: Completed 02/06/2020.   Lung Cancer Screening: (Low Dose CT Chest recommended if Age 81-80 years, 20 pack-year currently smoking OR have quit w/in 15years.) does not qualify.   Lung Cancer  Screening Referral: no  Additional Screening:  Hepatitis C Screening: does qualify; Completed 11/01/2019  Vision Screening: Recommended annual ophthalmology exams for early detection of glaucoma and other disorders of the eye. Is the patient up to date with their annual eye exam?  No  Who is the provider or what is the name of the office in which the patient attends annual eye exams? [facility] If pt is not established with a provider, would they like to be referred to a provider to establish care? No .   Dental Screening: Recommended annual dental exams for proper oral hygiene  Diabetic Foot Exam: Diabetic Foot Exam: Completed 04/28/2022  Community Resource Referral / Chronic Care Management:  CRR required this visit?  No   CCM required this visit?  No     Plan:     I have personally reviewed and noted the following in the patient's chart:   Medical and social history Use of alcohol, tobacco or illicit drugs  Current medications and supplements including opioid prescriptions. Patient is not currently taking opioid prescriptions. Functional ability and status Nutritional status Physical activity Advanced directives List of other physicians Hospitalizations, surgeries, and ER visits in previous 12 months Vitals Screenings to include cognitive, depression, and falls Referrals and appointments  In addition, I have reviewed and discussed with patient certain preventive protocols, quality metrics, and best practice recommendations. A written personalized care plan for preventive services as well as general preventive health recommendations were provided to patient.     Barb Merino, LPN   40/09/8117   After Visit Summary: (Pick Up) Due to this being a telephonic visit, with patients personalized plan was offered to patient and patient has requested to Pick up at office.  Nurse Notes: none

## 2022-10-13 ENCOUNTER — Other Ambulatory Visit: Payer: Self-pay | Admitting: Medical

## 2022-10-13 MED ORDER — ALPRAZOLAM 0.25 MG PO TABS: 0.2500 mg | ORAL_TABLET | Freq: Two times a day (BID) | ORAL | 0 refills | Status: DC | PRN

## 2022-10-13 MED ORDER — FUROSEMIDE 20 MG PO TABS: 20.0000 mg | ORAL_TABLET | Freq: Every day | ORAL | 0 refills | Status: DC

## 2022-10-13 NOTE — Progress Notes (Signed)
  Care Coordination Note  10/13/2022 Name: Sandra Ruiz MRN: [id number] DOB: May 10, 1945  Sandra Ruiz is a 77 y.o. year old female who is a primary care patient of Aleen Campi, Cleda Mccreedy and is actively engaged with the care management team. I reached out to Ander Gaster by phone today to assist with scheduling an initial visit with the BSW  Follow up plan: Telephone appointment with care management team member scheduled for:10/17  [facility] Coordination Care Guide  Direct Dial: [phone]

## 2022-10-15 ENCOUNTER — Telehealth: Payer: Self-pay

## 2022-10-15 NOTE — Telephone Encounter (Signed)
ILR alert for tachy, event occurred 10/10 @ 16:02, duration 11sec, HR 154 Not a new finding, Diltiazem scheduled and prn, [place]aq Increase tachy alert to 158, 48 beats to decrease non-actionable alerts - route to triage for awareness LA, CVRS  Have added to review with provider at patient next appointment and CV to add request to next scheduled remote for request to adjust.

## 2022-10-18 ENCOUNTER — Ambulatory Visit: Payer: Medicare Other

## 2022-10-19 NOTE — Progress Notes (Unsigned)
  Cardiology Office Note   Date:  10/20/2022  ID:  Sandra Ruiz, DOB December 11, 1945, MRN [id number] PCP:  [facility] Cardiologist: Olga Millers, MD  Reason for visit: 1-month follow-up  History of Present Illness    Sandra Ruiz is a 77 y.o. female with a hx of paroxysmal atrial fibrillation status post ablation, cardioversion in March 2023, history of loop recorder, minimal CAD on CTA in 2022, PAD.  After starting Ozempic/Mounjaro in June 2023 and has gone down from 220 pounds to 160 pounds.  I last saw her in April 2024.  Her blood pressure medications have been reduced following weight loss.  Her lightheadedness had improved.  Today, she fills me in that she was admitted to the hospital for symptomatic anemia in July 2024.  Patient complained of palpitations, shortness of breath and chest pain with exertion at her July 18 appointment with EP.  CBC showed hemoglobin 6.4.  Since hospital discharge, she has been followed by hematology with last hemoglobin 12.2 on December 19.    From a cardiac standpoint, she has been doing well.  She denies significant palpitations.  No exertional chest pain, shortness of breath, PND, orthopnea.  She currently takes Lasix 20 mg daily.  She has no lower extremity edema.  She had 1 episode of brief lightheadedness.  No syncope.  She is back on Eliquis 5 mg twice daily without bleeding issues.  Objective / Physical Exam   Vital signs:  BP 110/72 (BP Location: Left Arm, Patient Position: Sitting, Cuff Size: Normal)   Pulse 73   Ht 5\' 1"  (1.549 m)   Wt 153 lb 12.8 oz (69.8 kg)   SpO2 97%   BMI 29.06 kg/m     GEN: No acute distress NECK: No carotid bruits CARDIAC: RRR, no murmurs RESPIRATORY:  Clear to auscultation without rales, wheezing or rhonchi  EXTREMITIES: No edema  Assessment and Plan   History of syncope -Prev thought overmedicated following weight loss.  Blood pressure meds reduced. -Loop recorder in  place  -With her 1 episode of brief lightheadedness, I advised patient she can skip a day of Lasix if she has recurrent lightheadedness and monitor for swelling.  Stay hydrated.   Coronary artery disease, no angina -CTA done in 2022 following complaints of chest pain.  CTA showed minimal disease. -Continue statin therapy.  No aspirin given need for Eliquis.   Persistent atrial fibrillation, denies significant palpitations -Status post A-fib ablation in [place] x 2 in 2019 -ILR replacement 11/06/21 --> interrogation 10/06/22: AF burden is 0.5%  -Continue Eliquis for stroke prevention.  If recurrent anemia, EP is considering Watchman placement. -Followed by Dr. Nelly Laurence -- appt in 12/2022  Chronic diastolic heart failure, euvolemic -Continue Lasix 20mg  daily.  Monitor for lightheadedness.  Patient had normal kidney function in August 2024.   Hypertension, well-controlled -Continue current medications.   Hyperlipidemia -Did not tolerate Crestor or Lipitor -Lipids July 2024 with LDL 64.   History of small secundum ASD-prior echocardiogram did not show RV dilatation  -Monitor   Disposition - Follow-up in 6 months.   Signed, Cannon Kettle, PA-C  10/20/2022 [facility]

## 2022-10-20 ENCOUNTER — Encounter: Payer: Self-pay | Admitting: Physician Assistant

## 2022-10-20 ENCOUNTER — Other Ambulatory Visit: Payer: Self-pay | Admitting: Medical

## 2022-10-20 ENCOUNTER — Ambulatory Visit: Payer: Medicare Other | Attending: Physician Assistant | Admitting: Physician Assistant

## 2022-10-20 VITALS — BP 110/72 | HR 73 | Ht 61.0 in | Wt 153.8 lb

## 2022-10-20 DIAGNOSIS — I10 Essential (primary) hypertension: Secondary | ICD-10-CM | POA: Diagnosis not present

## 2022-10-20 DIAGNOSIS — E7800 Pure hypercholesterolemia, unspecified: Secondary | ICD-10-CM

## 2022-10-20 DIAGNOSIS — I4819 Other persistent atrial fibrillation: Secondary | ICD-10-CM | POA: Diagnosis not present

## 2022-10-20 DIAGNOSIS — I5032 Chronic diastolic (congestive) heart failure: Secondary | ICD-10-CM | POA: Diagnosis not present

## 2022-10-20 DIAGNOSIS — I2510 Atherosclerotic heart disease of native coronary artery without angina pectoris: Secondary | ICD-10-CM | POA: Diagnosis not present

## 2022-10-20 NOTE — Patient Instructions (Signed)
Medication Instructions:  No changes *If you need a refill on your cardiac medications before your next appointment, please call your pharmacy*   Lab Work: No Labs If you have labs (blood work) drawn today and your tests are completely normal, you will receive your results only by: MyChart Message (if you have MyChart) OR A paper copy in the mail If you have any lab test that is abnormal or we need to change your treatment, we will call you to review the results.   Testing/Procedures: No Testing   Follow-Up: At [facility], you and your health needs are our priority.  As part of our continuing mission to provide you with exceptional heart care, we have created designated Provider Care Teams.  These Care Teams include your primary Cardiologist (physician) and Advanced Practice Providers (APPs -  Physician Assistants and Nurse Practitioners) who all work together to provide you with the care you need, when you need it.  We recommend signing up for the patient portal called "MyChart".  Sign up information is provided on this After Visit Summary.  MyChart is used to connect with patients for Virtual Visits (Telemedicine).  Patients are able to view lab/test results, encounter notes, upcoming appointments, etc.  Non-urgent messages can be sent to your provider as well.   To learn more about what you can do with MyChart, go to [email or web address].    Your next appointment:   6 month(s)  Provider:   Olga Millers, MD

## 2022-10-21 ENCOUNTER — Ambulatory Visit: Payer: Self-pay

## 2022-10-21 NOTE — Patient Outreach (Signed)
  Care Coordination   Follow Up Visit Note   10/21/2022 Name: Sandra Ruiz MRN: [id number] DOB: Apr 23, 1945  Sandra Ruiz is a 77 y.o. year old female who sees Tysinger, Kermit Balo, PA-C for primary care. I spoke with  Sandra Ruiz by phone today.  What matters to the patients health and wellness today?  Identify resources to assist with food insecurity    Goals Addressed             This Visit's Progress    COMPLETED: Care Coordination Activities       Care Coordination Interventions: Discussed the patient is in need of food resources due to rising food costs, medical bills and being on a fixed income Determined the patient has applied for food and nutrition support in the past but was denied due to income Discussed the patient does not have concerns with medication copays, no options for cost savings to readjust budget at this time. Reviewed with the patient the opportunity to access a food pantry to supplement food in the home Determined the patient has visited a food pantry in the past but was concerned about receiving expired products Discussed plan for SW to mail the patient a list of food pantries in [facility] for her to review should she wish to visit a food pantry in the future Education provided on Congregational meal sites advising the patient she could attend a site to obtain a free lunch a few days a week to assist with food insecurity Contacted Burman Riis with [facility] who indicates there are openings at the [employer] site Advised the patient of how to access this site should she choose to move forward Collaboration with LCSW Jenel Lucks to advise of interventions and plan          SDOH assessments and interventions completed:  Yes  SDOH Interventions Today    Flowsheet Row Most Recent Value  SDOH Interventions   Food Insecurity Interventions Other (Comment)  [Provided the patient with a list of food pantry locations,   provided patient with information to congregational meals. Site in [place] has opening if patient would like to attend]  Housing Interventions Intervention Not Indicated  Transportation Interventions Intervention Not Indicated  Utilities Interventions Intervention Not Indicated        Care Coordination Interventions:  Yes, provided   Interventions Today    Flowsheet Row Most Recent Value  Chronic Disease   Chronic disease during today's visit Congestive Heart Failure (CHF), Atrial Fibrillation (AFib), Hypertension (HTN)  General Interventions   General Interventions Discussed/Reviewed General Interventions Discussed, [employer]  [Provided the patient with information on congregational food sites as well as food pantries in her area]  Nutrition Interventions   Nutrition Discussed/Reviewed Nutrition Discussed  [Patient reports she is over the income for FNS. Patient provided with education on food pantry locations as well as the [place] congregate meal site]        Follow up plan: No further intervention required. The patient will remain engaged with LCSW Jenel Lucks.    Encounter Outcome:  Patient Visit Completed   Bevelyn Ngo, BSW, CDP [facility] Health  [facility], [facility] Social Worker Direct Dial: [phone]  Fax: [phone]

## 2022-10-21 NOTE — Patient Instructions (Signed)
Visit Information  Thank you for taking time to visit with me today. Please don't hesitate to contact me if I can be of assistance to you.   Following are the goals we discussed today:   Goals Addressed             This Visit's Progress    COMPLETED: Care Coordination Activities       Care Coordination Interventions: Discussed the patient is in need of food resources due to rising food costs, medical bills and being on a fixed income Determined the patient has applied for food and nutrition support in the past but was denied due to income Discussed the patient does not have concerns with medication copays, no options for cost savings to readjust budget at this time. Reviewed with the patient the opportunity to access a food pantry to supplement food in the home Determined the patient has visited a food pantry in the past but was concerned about receiving expired products Discussed plan for SW to mail the patient a list of food pantries in [facility] for her to review should she wish to visit a food pantry in the future Education provided on [employer] sites advising the patient she could attend a site to obtain a free lunch a few days a week to assist with food insecurity Contacted Burman Riis with [facility] who indicates there are openings at the [employer] site Advised the patient of how to access this site should she choose to move forward Collaboration with LCSW Jenel Lucks to advise of interventions and plan          Your next appointment is by telephone on 11/14 at 10:00 with Jenel Lucks, LCSW.  Please call the care guide team at [phone] if you need to cancel or reschedule your appointment.   If you are experiencing a Mental Health or Behavioral Health Crisis or need someone to talk to, please call 1-800-273-TALK (toll free, 24 hour hotline) call 911  Patient verbalizes understanding of instructions and care plan provided  today and agrees to view in MyChart. Active MyChart status and patient understanding of how to access instructions and care plan via MyChart confirmed with patient.     No further follow up required: Please contact me as needed.  Bevelyn Ngo, BSW, CDP [facility]  [facility], [facility] Social Worker Direct Dial: [phone]  Fax: [phone]

## 2022-10-26 ENCOUNTER — Telehealth: Payer: Self-pay | Admitting: Medical

## 2022-10-26 NOTE — Telephone Encounter (Signed)
Pt left message that she needs refill [facility]

## 2022-10-26 NOTE — Progress Notes (Signed)
[facility] Summary Report / Loop Recorder 

## 2022-10-27 ENCOUNTER — Other Ambulatory Visit: Payer: Self-pay | Admitting: Medical

## 2022-10-27 MED ORDER — MOUNJARO 7.5 MG/0.5ML ~~LOC~~ SOAJ: 7.5000 mg | SUBCUTANEOUS | 5 refills | Status: DC

## 2022-10-28 ENCOUNTER — Telehealth: Payer: Self-pay | Admitting: Medical

## 2022-10-28 NOTE — Telephone Encounter (Signed)
Pt left message that needs refill Mounjaro, this message says she needs it at [facility], please switch pharmacy

## 2022-10-29 ENCOUNTER — Other Ambulatory Visit: Payer: Self-pay | Admitting: Medical

## 2022-10-29 MED ORDER — MOUNJARO 7.5 MG/0.5ML ~~LOC~~ SOAJ: 7.5000 mg | SUBCUTANEOUS | 3 refills | Status: DC

## 2022-10-29 NOTE — Telephone Encounter (Signed)
done

## 2022-11-02 ENCOUNTER — Ambulatory Visit
Admission: RE | Admit: 2022-11-02 | Discharge: 2022-11-02 | Disposition: A | Discharge status: Home / Self Care | Payer: Medicare Other | Source: Ambulatory Visit | Attending: Internal Medicine | Admitting: Internal Medicine

## 2022-11-02 DIAGNOSIS — K219 Gastro-esophageal reflux disease without esophagitis: Secondary | ICD-10-CM | POA: Diagnosis not present

## 2022-11-02 DIAGNOSIS — R1310 Dysphagia, unspecified: Secondary | ICD-10-CM

## 2022-11-02 DIAGNOSIS — K224 Dyskinesia of esophagus: Secondary | ICD-10-CM | POA: Diagnosis not present

## 2022-11-04 ENCOUNTER — Ambulatory Visit: Payer: Medicare Other | Admitting: Medical

## 2022-11-04 VITALS — BP 120/68 | HR 74 | Wt 153.0 lb

## 2022-11-04 DIAGNOSIS — F419 Anxiety disorder, unspecified: Secondary | ICD-10-CM | POA: Insufficient documentation

## 2022-11-04 DIAGNOSIS — Z23 Encounter for immunization: Secondary | ICD-10-CM

## 2022-11-04 DIAGNOSIS — D649 Anemia, unspecified: Secondary | ICD-10-CM | POA: Diagnosis not present

## 2022-11-04 DIAGNOSIS — I25118 Atherosclerotic heart disease of native coronary artery with other forms of angina pectoris: Secondary | ICD-10-CM

## 2022-11-04 DIAGNOSIS — E1159 Type 2 diabetes mellitus with other circulatory complications: Secondary | ICD-10-CM

## 2022-11-04 DIAGNOSIS — E1169 Type 2 diabetes mellitus with other specified complication: Secondary | ICD-10-CM | POA: Diagnosis not present

## 2022-11-04 DIAGNOSIS — Z7185 Encounter for immunization safety counseling: Secondary | ICD-10-CM

## 2022-11-04 DIAGNOSIS — N3281 Overactive bladder: Secondary | ICD-10-CM | POA: Insufficient documentation

## 2022-11-04 DIAGNOSIS — I4819 Other persistent atrial fibrillation: Secondary | ICD-10-CM | POA: Diagnosis not present

## 2022-11-04 DIAGNOSIS — Z8673 Personal history of transient ischemic attack (TIA), and cerebral infarction without residual deficits: Secondary | ICD-10-CM | POA: Diagnosis not present

## 2022-11-04 DIAGNOSIS — G629 Polyneuropathy, unspecified: Secondary | ICD-10-CM

## 2022-11-04 DIAGNOSIS — I700 Atherosclerosis of aorta: Secondary | ICD-10-CM | POA: Diagnosis not present

## 2022-11-04 DIAGNOSIS — E039 Hypothyroidism, unspecified: Secondary | ICD-10-CM | POA: Diagnosis not present

## 2022-11-04 DIAGNOSIS — M549 Dorsalgia, unspecified: Secondary | ICD-10-CM | POA: Diagnosis not present

## 2022-11-04 DIAGNOSIS — D6869 Other thrombophilia: Secondary | ICD-10-CM

## 2022-11-04 DIAGNOSIS — G8929 Other chronic pain: Secondary | ICD-10-CM

## 2022-11-04 DIAGNOSIS — E785 Hyperlipidemia, unspecified: Secondary | ICD-10-CM

## 2022-11-04 DIAGNOSIS — Z7901 Long term (current) use of anticoagulants: Secondary | ICD-10-CM

## 2022-11-04 MED ORDER — VITAMIN B-12 1000 MCG PO TABS: 1000.0000 ug | ORAL_TABLET | Freq: Every day | ORAL | 3 refills | Status: DC

## 2022-11-04 NOTE — Progress Notes (Signed)
Subjective:  Sandra Ruiz is a 77 y.o. female who presents for Chief Complaint  Patient presents with   Medical Management of Chronic Issues    Diabetes follow-up, had eye exam with [facility] in [place] on 11/15     Primary Care Provider Tahjay Binion, Kermit Balo, PA-C here for primary care  Current Health Care Team: Dentist, Dr. Stanford [place] doctor, Dr. Valentino Hue but can't remember name Dr. Janey Genta, pain management/neurosurgery Dr. Olga Millers, Juanda Crumble, PA, cardiology Dr. Carlus Pavlov, endocrinology Dr. Serena Croissant, hematology Dr. Arlyss Repress, opthalmology Dr. Charlott Rakes, GI [facility] dermatology   Concerns: Here for med check.  Overall doing okay.  She does have some back pain and stiffness.  She is going to the gym in [place] doing water aerobics.  She is compliant with her medicines to help with pain.  She sees pain management.  Diabetes-doing fine on Mounjaro.  She tolerates this well and is happy with her weight  She just recently had barium swallow study.  She continues on omeprazole.  Sees GI.  Sometimes she feels tired, sometimes she feels energetic.  No new problems  She has an eye doctor appointment coming up next month in [place], [place]  No other aggravating or relieving factors.    No other c/o.  Past Medical History:  Diagnosis Date   Chronic pain    Depression    Diabetes mellitus without complication (HCC)    GERD (gastroesophageal reflux disease)    History of TIA (transient ischemic attack)    Hyperlipidemia    Hyperopia of both eyes with astigmatism and presbyopia 12/21/2019   Hypertension    Lyme disease    Paroxysmal atrial fibrillation (HCC)    Sleep apnea    Thyroid disease    Current Outpatient Medications on File Prior to Visit  Medication Sig Dispense Refill   ALPRAZolam (XANAX) 0.25 MG tablet Take 1 tablet (0.25 mg total) by mouth 2 (two) times daily as needed for anxiety. 20 tablet 0   Artificial Tears  ophthalmic solution Place 1 drop into both eyes 3 (three) times daily as needed (for dryness).     BENADRYL ALLERGY 25 MG tablet Take 25 mg by mouth every 6 (six) hours as needed for itching.     diltiazem (CARDIZEM CD) 300 MG 24 hr capsule Take 1 capsule (300 mg total) by mouth daily. 90 capsule 2   diltiazem (CARDIZEM) 30 MG tablet TAKE 1 TABLET BY MOUTH EVERY 4 HOURS AS NEEDED FOR AFIB HEART RATE >100 AS LONG AS TOP BP >100 90 tablet 1   DULoxetine (CYMBALTA) 60 MG capsule TAKE 1 CAPSULE BY MOUTH TWICE  DAILY 200 capsule 1   ELIQUIS 5 MG TABS tablet TAKE 1 TABLET BY MOUTH TWICE  DAILY 200 tablet 2   ezetimibe (ZETIA) 10 MG tablet Take 1 tablet (10 mg total) by mouth daily. 100 tablet 1   furosemide (LASIX) 20 MG tablet Take 1 tablet (20 mg total) by mouth daily. 90 tablet 0   gabapentin (NEURONTIN) 800 MG tablet TAKE 1 TABLET BY MOUTH 3 TIMES  DAILY 300 tablet 0   levothyroxine (SYNTHROID) 75 MCG tablet TAKE 1 TABLET BY MOUTH DAILY  BEFORE BREAKFAST 90 tablet 3   MULTAQ 400 MG tablet TAKE 1 TABLET BY MOUTH TWICE  DAILY WITH A MEAL 200 tablet 2   nitroGLYCERIN (NITROSTAT) 0.4 MG SL tablet DISSOLVE 1 TABLET UNDER THE  TONGUE EVERY 5 MINUTES AS NEEDED FOR CHEST PAIN. MAX  OF 3 TABLETS IN 15 MINUTES. CALL 911 IF PAIN  PERSISTS. (Patient taking differently: Place 0.4 mg under the tongue every 5 (five) minutes x 3 doses as needed for chest pain (CALL 9-1-1 IF PAIN PERSISTS).) 25 tablet 3   omeprazole (PRILOSEC) 40 MG capsule Take 40 mg by mouth daily.     oxybutynin (DITROPAN-XL) 10 MG 24 hr tablet TAKE 1 TABLET BY MOUTH TWICE  DAILY 200 tablet 0   pravastatin (PRAVACHOL) 40 MG tablet TAKE 1 TABLET BY MOUTH IN THE  EVENING 100 tablet 1   tirzepatide (MOUNJARO) 7.5 MG/0.5ML Pen Inject 7.5 mg into the skin once a week. 6 mL 3   TYLENOL 500 MG tablet Take 500-1,000 mg by mouth every 6 (six) hours as needed for mild pain or headache.     ACCU-CHEK GUIDE test strip USE 1 TEST STRIP 1 TO 2 TIMES  DAILY 200  strip 2   Accu-Chek Softclix Lancets lancets Use as instructed 90 each 3   Blood Glucose Monitoring Suppl (ACCU-CHEK GUIDE ME) w/Device KIT Test 1-2 times daily 1 kit 0   No current facility-administered medications on file prior to visit.     The following portions of the patient's history were reviewed and updated as appropriate: allergies, current medications, past family history, past medical history, past social history, past surgical history and problem list.  ROS Otherwise as in subjective above  Objective: BP 120/68   Pulse 74   Wt 153 lb (69.4 kg)   BMI 28.91 kg/m   General appearance: alert, no distress, well developed, well nourished    Assessment: Encounter Diagnoses  Name Primary?   Type 2 diabetes mellitus with other specified complication, without long-term current use of insulin (HCC) Yes   COVID-19 vaccine administered    Needs flu shot    Aortic atherosclerosis (HCC)    Vaccine counseling    Persistent atrial fibrillation (HCC)    Hypothyroidism, unspecified type    Hypertension associated with diabetes (HCC)    Hyperlipidemia, unspecified hyperlipidemia type    Hypercoagulable state due to persistent atrial fibrillation (HCC)    History of TIA (transient ischemic attack)    Coronary artery disease of native artery of native heart with stable angina pectoris (HCC)    Chronic anemia      Plan: Hypothyroidism-sees endocrinology, compliant with levothyroxine 75 mcg daily, labs stable from 10/08/22   Aortic atherosclerosis, hyperlipidemia-intolerant to atorvastatin but does well on pravastatin 40 mg and Zetia 10 mg daily.  Continue current medications.  Lipid labs from July 2024 look good  Chronic anemia I reviewed over her 08/2022 hematology notes.    Microcytic iron deficiency anemia: [facility] Hospitalization 07/23/2022-07/28/2022: Admitted with hemoglobin of 5.8, after 6 doses of IV iron and 1 dose of Aranesp, hemoglobin improved to 9 g on  08/04/2022 08/09/2022: Hemoglobin 9.5 g (patient baseline hemoglobin is between 10 and 11)   Plan is monitor CBC and iron studies, next labs in 12/2022.  Administer more IV iron if necessary.  Will consider Aranesp once again if needed   Prior history of EGD and colonoscopy negative, capsule endoscopy was also performed. Eliquis has been restarted.  Patient will discuss with her cardiologist if Eliquis can be stopped.    Diabetes Last hemoglobin A1c a few months ago was less than 5%.  She is doing well on Mounjaro to both control sugars and to keep her weight stable.  Continue Mounjaro as long as gastroenterology does not like she needs to  change this given her recent swallow study findings   I reviewed over her cardiology visit from 10/20/2022.  Their plan notes are as follows History of syncope -Prev thought overmedicated following weight loss.  Blood pressure meds reduced. -Loop recorder in place  -With her 1 episode of brief lightheadedness, I advised patient she can skip a day of Lasix if she has recurrent lightheadedness and monitor for swelling.  Stay hydrated.   Coronary artery disease, no angina -CTA done in 2022 following complaints of chest pain.  CTA showed minimal disease. -Continue statin therapy.  No aspirin given need for Eliquis.   Persistent atrial fibrillation, denies significant palpitations -Status post A-fib ablation in [place] x 2 in 2019 -ILR replacement 11/06/21 --> interrogation 10/06/22: AF burden is 0.5%  -Continue Eliquis for stroke prevention.  If recurrent anemia, EP is considering Watchman placement. -Followed by Dr. Nelly Laurence -- appt in 12/2022   Chronic diastolic heart failure, euvolemic -Continue Lasix 20mg  daily.  Monitor for lightheadedness.  Patient had normal kidney function in August 2024.   Hypertension, well-controlled -Continue current medications.   Hyperlipidemia -Did not tolerate Crestor or Lipitor -Lipids July 2024 with LDL 64.   History  of small secundum ASD-prior echocardiogram did not show RV dilatation  -Monitor   Cardiology Disposition - Follow-up in 6 months.   History of depression, chronic pain, chronic back pain, chronic neck pain Continue Cymbalta 60 mg twice daily Continue gabapentin 800 mg 3 times a day   Anxiety and depression Continue Cymbalta 60mg  BID   OAB Continue Oxybutylin XL 10mg  daily   Vaccine counseling: Advised in a few weeks to possibly update Prenvar 20 and Tdap.   We will have her sign for records today to see if we get a copy of prior vaccines if she is not at home send sure when they were last done  Counseled on the influenza virus vaccine.  Vaccine information sheet given.  Influenza vaccine given after consent obtained.  Counseled on the Covid virus vaccine.  Vaccine information sheet given.  Covid vaccine given after consent obtained.  Sandra Ruiz was seen today for medical management of chronic issues.  Diagnoses and all orders for this visit:  Type 2 diabetes mellitus with other specified complication, without long-term current use of insulin (HCC)  COVID-19 vaccine administered -     Pfizer Comirnaty Covid -19 Vaccine 69yrs and older  Needs flu shot -     Flu Vaccine Trivalent High Dose (Fluad)  Aortic atherosclerosis (HCC)  Vaccine counseling  Persistent atrial fibrillation (HCC)  Hypothyroidism, unspecified type  Hypertension associated with diabetes (HCC)  Hyperlipidemia, unspecified hyperlipidemia type  Hypercoagulable state due to persistent atrial fibrillation (HCC)  History of TIA (transient ischemic attack)  Coronary artery disease of native artery of native heart with stable angina pectoris (HCC)  Chronic anemia  Other orders -     cyanocobalamin (VITAMIN B12) 1000 MCG tablet; Take 1 tablet (1,000 mcg total) by mouth daily.    Follow up: 47mo

## 2022-11-10 LAB — CUP PACEART REMOTE DEVICE CHECK
Date Time Interrogation Session: 20241103231554
Implantable Pulse Generator Implant Date: 20231103

## 2022-11-15 ENCOUNTER — Ambulatory Visit: Payer: Medicare Other

## 2022-11-15 ENCOUNTER — Ambulatory Visit (INDEPENDENT_AMBULATORY_CARE_PROVIDER_SITE_OTHER): Payer: Medicare Other

## 2022-11-15 DIAGNOSIS — I4819 Other persistent atrial fibrillation: Secondary | ICD-10-CM | POA: Diagnosis not present

## 2022-11-17 DIAGNOSIS — H0100B Unspecified blepharitis left eye, upper and lower eyelids: Secondary | ICD-10-CM | POA: Diagnosis not present

## 2022-11-17 DIAGNOSIS — H0100A Unspecified blepharitis right eye, upper and lower eyelids: Secondary | ICD-10-CM | POA: Diagnosis not present

## 2022-11-17 DIAGNOSIS — H04123 Dry eye syndrome of bilateral lacrimal glands: Secondary | ICD-10-CM | POA: Diagnosis not present

## 2022-11-17 DIAGNOSIS — H40023 Open angle with borderline findings, high risk, bilateral: Secondary | ICD-10-CM | POA: Diagnosis not present

## 2022-11-17 LAB — HM DIABETES EYE EXAM

## 2022-11-18 ENCOUNTER — Telehealth: Payer: Self-pay | Admitting: Licensed Clinical Social Worker

## 2022-11-18 ENCOUNTER — Encounter: Payer: Self-pay | Admitting: Licensed Clinical Social Worker

## 2022-11-18 NOTE — Patient Outreach (Signed)
  Care Coordination   11/18/2022 Name: Sandra Ruiz MRN: [id number] DOB: 09-17-1945   Care Coordination Outreach Attempts:  An unsuccessful telephone outreach was attempted for a scheduled appointment today.  Follow Up Plan:  Additional outreach attempts will be made to offer the patient care coordination information and services.   Encounter Outcome:  No Answer   Care Coordination Interventions:  No, not indicated    Jenel Lucks, MSW, LCSW [facility] Care Management [place]  [facility] [email or web address] Phone [phone] 12:26 PM

## 2022-11-19 ENCOUNTER — Telehealth: Payer: Self-pay | Admitting: Licensed Clinical Social Worker

## 2022-11-19 DIAGNOSIS — I152 Hypertension secondary to endocrine disorders: Secondary | ICD-10-CM

## 2022-11-19 NOTE — Patient Outreach (Addendum)
  Care Coordination   Follow Up Visit Note   11/19/2022 Name: Sandra Ruiz MRN: [id number] DOB: 12-30-1945  Sandra Ruiz is a 77 y.o. year old female who sees Tysinger, Kermit Balo, PA-C for primary care. I spoke with  Ander Gaster by phone today.  What matters to the patients health and wellness today?  Food insecurity, Symptom Management, Stress Management    Goals Addressed             This Visit's Progress    Obtain Supportive Resources-Financial   On track    Activities and task to complete in order to accomplish goals.   Keep all upcoming appointments discussed today Continue with compliance of taking medication prescribed by Doctor Implement healthy coping skills discussed to assist with management of symptoms F/up with pain management to discuss concerns Complete SNAP application through DSS          SDOH assessments and interventions completed:  Yes  SDOH Interventions Today    Flowsheet Row Most Recent Value  SDOH Interventions   Food Insecurity Interventions AMB Referral, Assist with SNAP Application  [LCSW offered to mail SNAP application to residence. Pt prefers to complete online, aware of website address. LCSW completed referral to care guide to discuss additional food resources]        Care Coordination Interventions:  Yes, provided  Interventions Today    Flowsheet Row Most Recent Value  Chronic Disease   Chronic disease during today's visit Diabetes, Hypertension (HTN), Atrial Fibrillation (AFib), Other  [Anxiety and Depression]  General Interventions   General Interventions Discussed/Reviewed General Interventions Reviewed, [facility], Doctor Visits  [LCSW completed [facility] referral for care guide to assist with ongoing food insecurity. Pt agreed to apply for SNAP benefits electronically]  Doctor Visits Discussed/Reviewed Doctor Visits Reviewed, Specialist, PCP  [Patient reports recent PCP appt went well. Discussed pain management,  noting increase in headaches, will contact PCP if they persist. Pt has an appt with hematologist in December for labwork (Iron/B12)]  PCP/Specialist Visits Compliance with follow-up visit  Exercise Interventions   Exercise Discussed/Reviewed Exercise Reviewed, Physical Activity  Physical Activity Discussed/Reviewed Gym  [Pt's membership to gym is covered through [facility],  however, recently learned she is responsibile for sliding scale fee for exercise classes. Patient often attends with friends, for no cost]  Mental Health Interventions   Mental Health Discussed/Reviewed Mental Health Reviewed, Coping Strategies, Anxiety, Depression  [Symptoms are managed well currently. Discussed strategies to assist with stress management due to financial strain]  Nutrition Interventions   Nutrition Discussed/Reviewed Nutrition Reviewed  [LCSW completed referral to assist with food insecurity]  Pharmacy Interventions   Pharmacy Dicussed/Reviewed Pharmacy Topics Reviewed, Medication Adherence  Safety Interventions   Safety Discussed/Reviewed Safety Reviewed       Follow up plan: Follow up call scheduled for 01/14/23   Encounter Outcome:  Patient Visit Completed   Jenel Lucks, MSW, LCSW [facility] Care Management [facility]  [facility] [email or web address] Phone [phone] 11:09 AM

## 2022-11-19 NOTE — Patient Instructions (Signed)
Visit Information  Thank you for taking time to visit with me today. Please don't hesitate to contact me if I can be of assistance to you.   Following are the goals we discussed today:   Goals Addressed             This Visit's Progress    Obtain Supportive Resources-Financial   On track    Activities and task to complete in order to accomplish goals.   Keep all upcoming appointments discussed today Continue with compliance of taking medication prescribed by Doctor Implement healthy coping skills discussed to assist with management of symptoms F/up with pain management to discuss concerns Complete SNAP application through DSS          Our next appointment is by telephone on 01/10 at 10 AM  Please call the care guide team at [phone] if you need to cancel or reschedule your appointment.   If you are experiencing a Mental Health or Behavioral Health Crisis or need someone to talk to, please call the Suicide and Crisis Lifeline: 988 call 911   Patient verbalizes understanding of instructions and care plan provided today and agrees to view in MyChart. Active MyChart status and patient understanding of how to access instructions and care plan via MyChart confirmed with patient.     Jenel Lucks, MSW, LCSW [facility] Care Management [place]  [facility] [email or web address] Phone [phone] 11:10 AM

## 2022-11-22 ENCOUNTER — Ambulatory Visit: Payer: Medicare Other

## 2022-11-23 ENCOUNTER — Telehealth: Payer: Self-pay | Admitting: *Deleted

## 2022-11-23 NOTE — Telephone Encounter (Signed)
   Telephone encounter was:  Unsuccessful.  11/23/2022 Name: KIMALA MCCLEESE MRN: [id number] DOB: [birth year]  Unsuccessful outbound call made today to assist with:  Food Insecurity  Outreach Attempt:  1st Attempt  A HIPAA compliant voice message was left requesting a return call.  Instructed patient to call back at [phone]. Dione Booze [facility]  Population Health Careguide  Direct Dial: [phone] Website: [email or web address]

## 2022-11-23 NOTE — Telephone Encounter (Signed)
   Telephone encounter was:  Successful.  11/23/2022 Name: SAOIRSE MELLEY MRN: [id number] DOB: 1945/08/09  MADHU CROTTEAU is a 77 y.o. year old female who is a primary care patient of Genia Del . The community resource team was consulted for assistance with Food Insecurity  Care guide performed the following interventions: Patient provided with information about care guide support team and interviewed to confirm resource needs. Patient provided information on food resources in her area and various other possible assitance agencies emailed more information  Follow Up Plan:  No further follow up planned at this time. The patient has been provided with needed resources.  Dione Booze [facility]  Population Health Careguide  Direct Dial: [phone] Website: [email or web address]

## 2022-11-29 ENCOUNTER — Encounter: Payer: Medicare Other | Admitting: Cardiovascular Disease

## 2022-12-08 ENCOUNTER — Inpatient Hospital Stay: Payer: Medicare Other | Attending: Hematology and Oncology

## 2022-12-08 DIAGNOSIS — E538 Deficiency of other specified B group vitamins: Secondary | ICD-10-CM | POA: Diagnosis not present

## 2022-12-08 DIAGNOSIS — D649 Anemia, unspecified: Secondary | ICD-10-CM

## 2022-12-08 DIAGNOSIS — D509 Iron deficiency anemia, unspecified: Secondary | ICD-10-CM | POA: Diagnosis not present

## 2022-12-08 LAB — CBC WITH DIFFERENTIAL (CANCER CENTER ONLY)
Abs Immature Granulocytes: 0 10*3/uL (ref 0.00–0.07)
Basophils Absolute: 0.1 10*3/uL (ref 0.0–0.1)
Basophils Relative: 1 %
Eosinophils Absolute: 0.2 10*3/uL (ref 0.0–0.5)
Eosinophils Relative: 4 %
HCT: 32 % — ABNORMAL LOW (ref 36.0–46.0)
Hemoglobin: 11.2 g/dL — ABNORMAL LOW (ref 12.0–15.0)
Immature Granulocytes: 0 %
Lymphocytes Relative: 28 %
Lymphs Abs: 1.2 10*3/uL (ref 0.7–4.0)
MCH: 31 pg (ref 26.0–34.0)
MCHC: 35 g/dL (ref 30.0–36.0)
MCV: 88.6 fL (ref 80.0–100.0)
Monocytes Absolute: 0.3 10*3/uL (ref 0.1–1.0)
Monocytes Relative: 8 %
Neutro Abs: 2.4 10*3/uL (ref 1.7–7.7)
Neutrophils Relative %: 59 %
Platelet Count: 273 10*3/uL (ref 150–400)
RBC: 3.61 MIL/uL — ABNORMAL LOW (ref 3.87–5.11)
RDW: 12.2 % (ref 11.5–15.5)
WBC Count: 4.1 10*3/uL (ref 4.0–10.5)
nRBC: 0 % (ref 0.0–0.2)

## 2022-12-08 LAB — IRON AND IRON BINDING CAPACITY (CC-WL,HP ONLY)
Iron: 68 ug/dL (ref 28–170)
Saturation Ratios: 22 % (ref 10.4–31.8)
TIBC: 309 ug/dL (ref 250–450)
UIBC: 241 ug/dL (ref 148–442)

## 2022-12-08 LAB — VITAMIN B12: Vitamin B-12: 778 pg/mL (ref 180–914)

## 2022-12-08 LAB — FERRITIN: Ferritin: 35 ng/mL (ref 11–307)

## 2022-12-09 DIAGNOSIS — Z7189 Other specified counseling: Secondary | ICD-10-CM | POA: Diagnosis not present

## 2022-12-09 DIAGNOSIS — L814 Other melanin hyperpigmentation: Secondary | ICD-10-CM | POA: Diagnosis not present

## 2022-12-09 DIAGNOSIS — L821 Other seborrheic keratosis: Secondary | ICD-10-CM | POA: Diagnosis not present

## 2022-12-09 DIAGNOSIS — D225 Melanocytic nevi of trunk: Secondary | ICD-10-CM | POA: Diagnosis not present

## 2022-12-09 DIAGNOSIS — Z08 Encounter for follow-up examination after completed treatment for malignant neoplasm: Secondary | ICD-10-CM | POA: Diagnosis not present

## 2022-12-09 DIAGNOSIS — Z85828 Personal history of other malignant neoplasm of skin: Secondary | ICD-10-CM | POA: Diagnosis not present

## 2022-12-10 NOTE — Progress Notes (Signed)
[facility] Summary Report / Loop Recorder 

## 2022-12-11 ENCOUNTER — Other Ambulatory Visit: Payer: Self-pay | Admitting: Medical

## 2022-12-13 ENCOUNTER — Ambulatory Visit: Payer: Medicare Other

## 2022-12-13 ENCOUNTER — Inpatient Hospital Stay (HOSPITAL_BASED_OUTPATIENT_CLINIC_OR_DEPARTMENT_OTHER): Payer: Medicare Other | Admitting: Hematology and Oncology

## 2022-12-13 DIAGNOSIS — D649 Anemia, unspecified: Secondary | ICD-10-CM | POA: Diagnosis not present

## 2022-12-13 DIAGNOSIS — I4819 Other persistent atrial fibrillation: Secondary | ICD-10-CM | POA: Diagnosis not present

## 2022-12-13 NOTE — Assessment & Plan Note (Signed)
Microcytic iron deficiency anemia: [facility] Hospitalization 07/23/2022-07/28/2022: Admitted with hemoglobin of 5.8, after 6 doses of IV iron and 1 dose of Aranesp, hemoglobin improved to 9 g on 08/04/2022 08/09/2022: Hemoglobin 9.5 g (patient baseline hemoglobin is between 10 and 11) 09/23/2022: Hemoglobin 12.2 12/08/2022: Hemoglobin 11.2, MCV 88.6, B12 778, iron saturation 22%, ferritin 35   Recommendation:  Recheck CBC and iron studies in 3 months Administer more IV iron if necessary Will consider Aranesp if needed   Prior history of EGD and colonoscopy negative, capsule endoscopy was also performed. Takes Eliquis

## 2022-12-13 NOTE — Progress Notes (Signed)
HEMATOLOGY-ONCOLOGY TELEPHONE VISIT PROGRESS NOTE  I connected with our patient on 12/13/22 at  8:30 AM EST by telephone and verified that I am speaking with the correct person using two identifiers.  I discussed the limitations, risks, security and privacy concerns of performing an evaluation and management service by telephone and the availability of in person appointments.  I also discussed with the patient that there may be a patient responsible charge related to this service. The patient expressed understanding and agreed to proceed.   History of Present Illness:   History of Present Illness   The patient, with a history of anemia and vitamin B12 deficiency, presents for a routine follow-up. She reports feeling well and has no new complaints. Her hemoglobin has slightly decreased from 12 to 11.2 since her last visit in September, but remains within an acceptable range. Her iron levels have also decreased since September, but are still adequate with a ferritin level of 35. The patient's vitamin B12 level is 778, which is also within a good range. She has not received any iron or B12 injections since July and her levels have remained stable.        Oncology History   No history exists.    REVIEW OF SYSTEMS:   Constitutional: Denies fevers, chills or abnormal weight loss All other systems were reviewed with the patient and are negative. Observations/Objective:     Assessment Plan:  Chronic anemia Microcytic iron deficiency anemia: [employer] IV iron: July 2024  Hospitalization 07/23/2022-07/28/2022: Admitted with hemoglobin of 5.8, after 6 doses of IV iron and 1 dose of Aranesp, hemoglobin improved to 9 g on 08/04/2022 08/09/2022: Hemoglobin 9.5 g (patient baseline hemoglobin is between 10 and 11) 09/23/2022: Hemoglobin 12.2 12/08/2022: Hemoglobin 11.2, MCV 88.6, B12 778, iron saturation 22%, ferritin 35   Recommendation:  Recheck CBC and iron studies in 3 months  Will consider  Aranesp if needed   Prior history of EGD and colonoscopy negative, capsule endoscopy was also performed. Takes Eliquis   I discussed the assessment and treatment plan with the patient. The patient was provided an opportunity to ask questions and all were answered. The patient agreed with the plan and demonstrated an understanding of the instructions. The patient was advised to call back or seek an in-person evaluation if the symptoms worsen or if the condition fails to improve as anticipated.   I provided 12 minutes of non-face-to-face time during this encounter.  This includes time for charting and coordination of care   Tamsen Meek, MD

## 2022-12-15 DIAGNOSIS — M48062 Spinal stenosis, lumbar region with neurogenic claudication: Secondary | ICD-10-CM | POA: Diagnosis not present

## 2022-12-15 LAB — CUP PACEART REMOTE DEVICE CHECK
Date Time Interrogation Session: 20241206230721
Implantable Pulse Generator Implant Date: 20231103

## 2022-12-16 ENCOUNTER — Ambulatory Visit: Payer: Medicare Other | Admitting: Cardiovascular Disease

## 2022-12-16 NOTE — Progress Notes (Signed)
  Electrophysiology Office Note:   Date:  12/17/2022  ID:  DAVISHA GALLEGO, DOB July 27, 1945, MRN [id number]  Primary Cardiologist: Olga Millers, MD Electrophysiologist: Maurice Small, MD      History of Present Illness:   Sandra Ruiz is a 77 y.o. female with h/o GERD, depression, TIA, OSA, HTN, and AF seen today for routine electrophysiology followup.   Since last being seen in our clinic the patient reports doing well. She had a pain in her R shoulder last night that she states is 5-6 out of 10. Didn't take a NTG, and didn't bother her enough to get out of bed to get one. Did get an epidural for back pain earlier this week. Otherwise,  she denies chest pain, palpitations, dyspnea, PND, orthopnea, nausea, vomiting, dizziness, syncope, edema, weight gain, or early satiety.   Review of systems complete and found to be negative unless listed in HPI.   EP Information / Studies Reviewed:    EKG is not ordered today. EKG from 09/23/2022 reviewed which showed NSR at 83 bpm       Studies:  Stress Myoview 06/2019 > LVEF 65%, nuclear stress EF 72%, no ST segment deviation, normal study / low risk  CT Coronary 09/2020 > 3 vessel coronary calcium 67th percentile, normal aortic root, CAD RADS 1 non-obstructive CAD, no PFO  ECHO 01/2022 > LVEF 50%, LV low normal function, RV systolic normal    Arrhythmia / AAD Hx  AF diagnosed 02/2017  2 Prior AFib ablation in [place] MDT loop implanted 11/06/21 for AF surveillance  Multaq initiated 04/2022   Physical Exam:   VS:  BP 138/76   Pulse 61   Ht 5\' 1"  (1.549 m)   Wt 152 lb (68.9 kg)   SpO2 99%   BMI 28.72 kg/m    Wt Readings from Last 3 Encounters:  12/17/22 152 lb (68.9 kg)  11/04/22 153 lb (69.4 kg)  10/20/22 153 lb 12.8 oz (69.8 kg)     GEN: Well nourished, well developed in no acute distress NECK: No JVD; No carotid bruits CARDIAC: Regular rate and rhythm, no murmurs, rubs, gallops RESPIRATORY:  Clear to auscultation without rales,  wheezing or rhonchi  ABDOMEN: Soft, non-tender, non-distended EXTREMITIES:  No edema; No deformity   ASSESSMENT AND PLAN:    Assessment & Plan Persistent atrial fibrillation (HCC) Continue eliquis 5 mg BID for CHA2DS2VASc of at least 5  SVT (supraventricular tachycardia) (HCC) Low burden by device 11s episode in October Continue multaq 400 mg BId Continue diltiazem 300 mg daily Continue diltiazem 30 mg prn  Hypercoagulable state due to persistent atrial fibrillation (HCC) Pt on Eliquis as above  Precordial pain Not likely cardiac. Prior Cor CT with 67-69th percentile and no obvious obstructive disease.       Follow up with Dr. Nelly Laurence in 6 months  Signed, Graciella Freer, PA-C

## 2022-12-16 NOTE — Assessment & Plan Note (Signed)
Continue eliquis 5 mg BID for CHA2DS2VASc of at least 5

## 2022-12-16 NOTE — Assessment & Plan Note (Signed)
Pt on Eliquis as above

## 2022-12-17 ENCOUNTER — Ambulatory Visit: Payer: Medicare Other | Attending: Student | Admitting: Student

## 2022-12-17 ENCOUNTER — Encounter: Payer: Self-pay | Admitting: Student

## 2022-12-17 VITALS — BP 138/76 | HR 61 | Ht 61.0 in | Wt 152.0 lb

## 2022-12-17 DIAGNOSIS — I4819 Other persistent atrial fibrillation: Secondary | ICD-10-CM

## 2022-12-17 DIAGNOSIS — D6869 Other thrombophilia: Secondary | ICD-10-CM

## 2022-12-17 DIAGNOSIS — R072 Precordial pain: Secondary | ICD-10-CM | POA: Diagnosis not present

## 2022-12-17 DIAGNOSIS — I471 Supraventricular tachycardia, unspecified: Secondary | ICD-10-CM | POA: Diagnosis not present

## 2022-12-17 NOTE — Patient Instructions (Signed)
Medication Instructions:  Your physician recommends that you continue on your current medications as directed. Please refer to the Current Medication list given to you today.  *If you need a refill on your cardiac medications before your next appointment, please call your pharmacy*  Lab Work: None ordered If you have labs (blood work) drawn today and your tests are completely normal, you will receive your results only by: MyChart Message (if you have MyChart) OR A paper copy in the mail If you have any lab test that is abnormal or we need to change your treatment, we will call you to review the results.  Follow-Up: At [facility], you and your health needs are our priority.  As part of our continuing mission to provide you with exceptional heart care, we have created designated Provider Care Teams.  These Care Teams include your primary Cardiologist (physician) and Advanced Practice Providers (APPs -  Physician Assistants and Nurse Practitioners) who all work together to provide you with the care you need, when you need it.  Your next appointment:   6 month(s)  Provider:   Augustus Mealor, MD  

## 2022-12-19 ENCOUNTER — Other Ambulatory Visit: Payer: Self-pay | Admitting: Medical

## 2022-12-20 ENCOUNTER — Ambulatory Visit: Payer: Medicare Other

## 2022-12-21 ENCOUNTER — Telehealth: Payer: Self-pay | Admitting: Cardiology

## 2022-12-21 NOTE — Telephone Encounter (Signed)
   Pre-operative Risk Assessment  Last visit: 12/17/2022 Next visit: none Patient Name: Sandra Ruiz  DOB: [birth year] MRN: [id number]      Request for Surgical Clearance    Procedure:   cervical epidural C6-C7  Date of Surgery:  Clearance TBD                                 Surgeon:  Dr. Aileen Fass Surgeon's Group or Practice Name:  [facility] Phone number:  [phone] Fax number:  [phone]   Type of Clearance Requested:   - Pharmacy:  Hold Apixaban (Eliquis) hold 3 days prior and continue after procedure   Type of Anesthesia:  None    Additional requests/questions:    Sharen Hones   12/21/2022, 11:42 AM

## 2022-12-23 NOTE — Telephone Encounter (Signed)
Patient with diagnosis of afib on Eliquis for anticoagulation.    Procedure: cervical epidural C6-C7  Date of procedure: TBD   CHA2DS2-VASc Score = 9   This indicates a 12.2% annual risk of stroke. The patient's score is based upon: CHF History: 1 HTN History: 1 Diabetes History: 1 Stroke History: 2 Vascular Disease History: 1 Age Score: 2 Gender Score: 1      CrCl 58 ml/min Platelet count 273  Patient previously cleared to hold Eliquis 3 days.  Per office protocol, patient can hold Eliquis for 3 days prior to procedure.    **This guidance is not considered finalized until pre-operative APP has relayed final recommendations.**

## 2022-12-23 NOTE — Telephone Encounter (Signed)
   Patient Name: Sandra Ruiz  DOB: 08-13-1945 MRN: [id number]  Primary Cardiologist: Olga Millers, MD  Clinical pharmacists have reviewed the patient's past medical history, labs, and current medications as part of preoperative protocol coverage. The following recommendations have been made:   Patient previously cleared to hold Eliquis 3 days. Per office protocol, patient can hold Eliquis for 3 days prior to procedure.   I will route this recommendation to the requesting party via Epic fax function and remove from pre-op pool.  Please call with questions.  Napoleon Form, Leodis Rains, NP 12/23/2022, 2:16 PM

## 2022-12-27 ENCOUNTER — Ambulatory Visit: Payer: Medicare Other

## 2023-01-10 ENCOUNTER — Other Ambulatory Visit: Payer: Self-pay | Admitting: Medical

## 2023-01-12 DIAGNOSIS — M48062 Spinal stenosis, lumbar region with neurogenic claudication: Secondary | ICD-10-CM | POA: Diagnosis not present

## 2023-01-12 DIAGNOSIS — M5412 Radiculopathy, cervical region: Secondary | ICD-10-CM | POA: Diagnosis not present

## 2023-01-14 ENCOUNTER — Ambulatory Visit: Payer: Self-pay | Admitting: Licensed Clinical Social Worker

## 2023-01-14 NOTE — Patient Instructions (Signed)
 Visit Information  Thank you for taking time to visit with me today. Please don't hesitate to contact me if I can be of assistance to you.   Following are the goals we discussed today:   Goals Addressed             This Visit's Progress    Obtain Supportive Resources-Financial   On track    Activities and task to complete in order to accomplish goals.   Keep all upcoming appointments discussed today Continue with compliance of taking medication prescribed by Doctor Implement healthy coping skills discussed to assist with management of symptoms Schedule PCP appt to discuss med concerns Follow up with Social Security regarding 2.5 percent increase         Our next appointment is by telephone on 02/7 at 10:30 AM  Please call the care guide team at [phone] if you need to cancel or reschedule your appointment.   If you are experiencing a Mental Health or Behavioral Health Crisis or need someone to talk to, please call the Suicide and Crisis Lifeline: 988 call 911   Patient verbalizes understanding of instructions and care plan provided today and agrees to view in MyChart. Active MyChart status and patient understanding of how to access instructions and care plan via MyChart confirmed with patient.     Jing Howatt, MSW, LCSW [facility] Care Management [place]  [facility] [email or web address] Phone [phone] 10:31 AM

## 2023-01-14 NOTE — Patient Outreach (Signed)
  Care Coordination   Follow Up Visit Note   01/14/2023 Name: Sandra Ruiz MRN: [id number] DOB: Sep 22, 1945  Sandra Ruiz is a 78 y.o. year old female who sees Tysinger, Alm RAMAN, PA-C for primary care. I spoke with  Sandra Ruiz by phone today.  What matters to the patients health and wellness today?  Symptom Management, SDOH    Goals Addressed             This Visit's Progress    Obtain Supportive Resources-Financial   On track    Activities and task to complete in order to accomplish goals.   Keep all upcoming appointments discussed today Continue with compliance of taking medication prescribed by Doctor Implement healthy coping skills discussed to assist with management of symptoms Schedule PCP appt to discuss med concerns Follow up with Social Security regarding 2.5 percent increase         SDOH assessments and interventions completed:  Yes  SDOH Interventions Today    Flowsheet Row Most Recent Value  SDOH Interventions   Food Insecurity Interventions Other (Comment)  [Pt reports she and spouse are expecting an increase in funds this year to assist with needs]  Housing Interventions Intervention Not Indicated  Transportation Interventions Intervention Not Indicated  Utilities Interventions Intervention Not Indicated        Care Coordination Interventions:  Yes, provided  Interventions Today    Flowsheet Row Most Recent Value  Chronic Disease   Chronic disease during today's visit Atrial Fibrillation (AFib), Hypertension (HTN), Diabetes, Other  [Anxiety and Depression]  General Interventions   General Interventions Discussed/Reviewed General Interventions Reviewed, Doctor Visits, Community Resources  Doctor Visits Discussed/Reviewed Doctor Visits Reviewed  Exercise Interventions   Exercise Discussed/Reviewed Exercise Reviewed, Physical Activity  Physical Activity Discussed/Reviewed Physical Activity Reviewed, Types of exercise  Mental Health  Interventions   Mental Health Discussed/Reviewed Mental Health Reviewed, Coping Strategies  Nutrition Interventions   Nutrition Discussed/Reviewed Nutrition Reviewed  Pharmacy Interventions   Pharmacy Dicussed/Reviewed Pharmacy Topics Reviewed, Medication Adherence  Safety Interventions   Safety Discussed/Reviewed Safety Reviewed       Follow up plan: Follow up call scheduled for 4-6 weeks    Encounter Outcome:  Patient Visit Completed   Sandra Ruiz, MSW, LCSW [facility] Care Management [facility]  [facility] [email or web address] Phone [phone] 10:30 AM

## 2023-01-17 ENCOUNTER — Ambulatory Visit (INDEPENDENT_AMBULATORY_CARE_PROVIDER_SITE_OTHER): Payer: Medicare Other

## 2023-01-17 DIAGNOSIS — I4819 Other persistent atrial fibrillation: Secondary | ICD-10-CM | POA: Diagnosis not present

## 2023-01-17 LAB — CUP PACEART REMOTE DEVICE CHECK
Date Time Interrogation Session: 20250112231809
Implantable Pulse Generator Implant Date: 20231103

## 2023-01-20 NOTE — Addendum Note (Signed)
Addended by: Geralyn Flash D on: 01/20/2023 01:00 PM   Modules accepted: Orders

## 2023-01-20 NOTE — Progress Notes (Signed)
[facility] Summary Report / Loop Recorder 

## 2023-01-24 ENCOUNTER — Ambulatory Visit: Payer: Medicare Other

## 2023-01-29 ENCOUNTER — Encounter: Payer: Self-pay | Admitting: Cardiovascular Disease

## 2023-01-29 ENCOUNTER — Other Ambulatory Visit: Payer: Self-pay | Admitting: Medical

## 2023-01-29 DIAGNOSIS — E7800 Pure hypercholesterolemia, unspecified: Secondary | ICD-10-CM

## 2023-01-31 ENCOUNTER — Ambulatory Visit: Payer: Medicare Other

## 2023-02-07 ENCOUNTER — Telehealth: Payer: Self-pay

## 2023-02-07 NOTE — Telephone Encounter (Signed)
Pt called and asked if she can have MD appt moved up sooner d/t not feeling well. She reports she is always tired, depressed and only wants to sleep. She is concerned her hgb has dropped. She denies dizziness, SHOB, chest pain, abd pain, bleeding. She has been r/s to havs labs 2/21 and MD visit 2/25 telephone. Pt educated on emergency sx and was advised to go to ED if sx worsen before appt.

## 2023-02-11 ENCOUNTER — Encounter: Payer: Medicare Other | Admitting: Licensed Clinical Social Worker

## 2023-02-11 ENCOUNTER — Telehealth: Payer: Self-pay | Admitting: Medical

## 2023-02-11 ENCOUNTER — Encounter: Payer: Self-pay | Admitting: Licensed Clinical Social Worker

## 2023-02-11 NOTE — Telephone Encounter (Signed)
 Pt called said not doing well, said stopped Gabapentin  3 weeks ago due to increased risk of amputation, said she has been having a hard time since, she was depressed & not feeling good.  I asked if she needed to go to the hospital, she said she couldn't afford that,  said her heart was racing.  Pt said child psychotherapist had called her this morning but she missed the call.  Told her I would send Ludie message & call social worker for her.  Advised if she got any worse to go to ER.  We did not have any appts left for today.  Called Jasmine social worker & she will call patient later today.

## 2023-02-11 NOTE — Telephone Encounter (Signed)
 Called and spoke to patient. She states she is not going to take gabapentin  as it causes A-fib, anemia, amputations, vitamin deficiency. She said she stopped this medication 3 weeks ago. She said she should have listened to her cardiologist back in [place]  about this medication. She will come in next week to discuss what is going on. Advised her if she starts having palpitations and chest pain again then she needs to go to ER or UC

## 2023-02-13 NOTE — Telephone Encounter (Signed)
 done

## 2023-02-14 NOTE — Patient Outreach (Signed)
  Care Coordination   Follow Up Visit Note   02/11/2023 Name: Sandra Ruiz MRN: [id number] DOB: 08/29/1945  Sandra Ruiz is a 78 y.o. year old female who sees Tysinger, Alm RAMAN, PA-C for primary care. I  engaged with PCP office   What matters to the patients health and wellness today?  LCSW engaged with office staff about pt symptoms. LCSW left message for pt to return LCSW's call   SDOH assessments and interventions completed:  No     Care Coordination Interventions:  Yes, provided  Interventions Today    Flowsheet Row Most Recent Value  Chronic Disease   Chronic disease during today's visit Other  [Anxiety and depression]  General Interventions   General Interventions Discussed/Reviewed Communication with  Communication with PCP/Specialists  [LCSW spoke with PCP office staff. States pt called office after receiving missed call from LCSW in the morning. States pt is feeling down and requested a callback]  Mental Health Interventions   Mental Health Discussed/Reviewed Mental Health Reviewed       Follow up plan:  LCSW will make additional attempts to speak with Pt    Encounter Outcome:  Patient Visit Completed   Rolin Kerns, LCSW [facility]  [facility], [facility] Clinical Social Worker Direct Dial: [phone]  Fax: [phone] Website: [email or web address] 4:17 PM

## 2023-02-15 ENCOUNTER — Ambulatory Visit (INDEPENDENT_AMBULATORY_CARE_PROVIDER_SITE_OTHER): Payer: Medicare Other | Admitting: Medical

## 2023-02-15 VITALS — BP 122/80 | HR 95 | Wt 141.8 lb

## 2023-02-15 DIAGNOSIS — D649 Anemia, unspecified: Secondary | ICD-10-CM | POA: Diagnosis not present

## 2023-02-15 DIAGNOSIS — E039 Hypothyroidism, unspecified: Secondary | ICD-10-CM

## 2023-02-15 DIAGNOSIS — E785 Hyperlipidemia, unspecified: Secondary | ICD-10-CM

## 2023-02-15 DIAGNOSIS — Z79899 Other long term (current) drug therapy: Secondary | ICD-10-CM

## 2023-02-15 DIAGNOSIS — L299 Pruritus, unspecified: Secondary | ICD-10-CM

## 2023-02-15 DIAGNOSIS — K760 Fatty (change of) liver, not elsewhere classified: Secondary | ICD-10-CM | POA: Diagnosis not present

## 2023-02-15 DIAGNOSIS — F32A Depression, unspecified: Secondary | ICD-10-CM

## 2023-02-15 DIAGNOSIS — F419 Anxiety disorder, unspecified: Secondary | ICD-10-CM

## 2023-02-15 NOTE — Progress Notes (Signed)
Subjective:  Sandra Ruiz is a 78 y.o. female who presents for Chief Complaint  Patient presents with   Consult    Discuss gabapentin. Took herself off Gabapentin, as she read side effects about A-Fib, ampuations, other issues and so she stopped it. She is having some anixety and didn't know she had to taper off this. Been without gabapentin for 4 weeks     Here for concerns about medication.  A few weeks ago she took herself off the gabapentin abruptly.  Since then she had been having anxious feelings, nervousness and itching.  She started reading about side effects and feels like the gabapentin may have contributed to some issues she has had in the past year including A-fib and anemia.  She has been on gabapentin for several years, started by pain management doctor in [place].  She was on other stronger pain medicine before getting to gabapentin.  She does not want to be on gabapentin anymore.  She feels like her Cymbalta is working okay.  She is okay with the rest of her medications  Her main concern today is to discuss the gabapentin and the fact that she has been itchy in her skin for the past month.  Feels itchy inside and out  She has been taking a little bit of Benadryl here and there  She feels like she has had withdrawals to being off the gabapentin and stopping it cold [place]  No other aggravating or relieving factors.    No other c/o.  Past Medical History:  Diagnosis Date   Chronic pain    Depression    Diabetes mellitus without complication (HCC)    GERD (gastroesophageal reflux disease)    History of TIA (transient ischemic attack)    Hyperlipidemia    Hyperopia of both eyes with astigmatism and presbyopia 12/21/2019   Hypertension    Lyme disease    Paroxysmal atrial fibrillation (HCC)    Sleep apnea    Thyroid disease    Current Outpatient Medications on File Prior to Visit  Medication Sig Dispense Refill   ALPRAZolam (XANAX) 0.25 MG tablet Take 1 tablet  (0.25 mg total) by mouth 2 (two) times daily as needed for anxiety. 20 tablet 0   Artificial Tears ophthalmic solution Place 1 drop into both eyes 3 (three) times daily as needed (for dryness).     diltiazem (CARDIZEM CD) 300 MG 24 hr capsule Take 1 capsule (300 mg total) by mouth daily. 90 capsule 2   diltiazem (CARDIZEM) 30 MG tablet TAKE 1 TABLET BY MOUTH EVERY 4 HOURS AS NEEDED FOR AFIB HEART RATE >100 AS LONG AS TOP BP >100 90 tablet 1   DULoxetine (CYMBALTA) 60 MG capsule TAKE 1 CAPSULE BY MOUTH TWICE  DAILY 200 capsule 1   ELIQUIS 5 MG TABS tablet TAKE 1 TABLET BY MOUTH TWICE  DAILY 200 tablet 2   ezetimibe (ZETIA) 10 MG tablet Take 1 tablet (10 mg total) by mouth daily. 100 tablet 1   furosemide (LASIX) 20 MG tablet TAKE 1 TABLET BY MOUTH EVERY DAY 90 tablet 1   levothyroxine (SYNTHROID) 75 MCG tablet TAKE 1 TABLET BY MOUTH DAILY  BEFORE BREAKFAST 90 tablet 3   MULTAQ 400 MG tablet TAKE 1 TABLET BY MOUTH TWICE  DAILY WITH A MEAL 200 tablet 2   nitroGLYCERIN (NITROSTAT) 0.4 MG SL tablet DISSOLVE 1 TABLET UNDER THE  TONGUE EVERY 5 MINUTES AS NEEDED FOR CHEST PAIN. MAX OF 3 TABLETS IN 15 MINUTES.  CALL 911 IF PAIN  PERSISTS. (Patient taking differently: Place 0.4 mg under the tongue every 5 (five) minutes x 3 doses as needed for chest pain (CALL 9-1-1 IF PAIN PERSISTS).) 25 tablet 3   omeprazole (PRILOSEC) 40 MG capsule Take 40 mg by mouth daily.     oxybutynin (DITROPAN-XL) 10 MG 24 hr tablet TAKE 1 TABLET BY MOUTH TWICE  DAILY 200 tablet 1   pravastatin (PRAVACHOL) 40 MG tablet TAKE 1 TABLET BY MOUTH IN THE  EVENING 100 tablet 1   tirzepatide (MOUNJARO) 7.5 MG/0.5ML Pen Inject 7.5 mg into the skin once a week. 6 mL 3   TYLENOL 500 MG tablet Take 500-1,000 mg by mouth every 6 (six) hours as needed for mild pain or headache.     ACCU-CHEK GUIDE test strip USE 1 TEST STRIP 1 TO 2 TIMES  DAILY 200 strip 2   Accu-Chek Softclix Lancets lancets Use as instructed 90 each 3   BENADRYL ALLERGY 25 MG  tablet Take 25 mg by mouth every 6 (six) hours as needed for itching.     Blood Glucose Monitoring Suppl (ACCU-CHEK GUIDE ME) w/Device KIT Test 1-2 times daily 1 kit 0   No current facility-administered medications on file prior to visit.     The following portions of the patient's history were reviewed and updated as appropriate: allergies, current medications, past family history, past medical history, past social history, past surgical history and problem list.  ROS Otherwise as in subjective above  Objective: BP 122/80   Pulse 95   Wt 141 lb 12.8 oz (64.3 kg)   BMI 26.79 kg/m   Wt Readings from Last 3 Encounters:  02/15/23 141 lb 12.8 oz (64.3 kg)  12/17/22 152 lb (68.9 kg)  11/04/22 153 lb (69.4 kg)   General appearance: alert, no distress, well developed, well nourished Skin: unremarkable, no rash, no excoriations Neck: supple, no lymphadenopathy, no thyromegaly, no masses Pulses: 2+ radial pulses, 2+ pedal pulses, normal cap refill Ext: no edema   Assessment: Encounter Diagnoses  Name Primary?   Pruritic condition Yes   Hypothyroidism, unspecified type    Hyperlipidemia, unspecified hyperlipidemia type    High risk medication use    Fatty liver    Chronic anemia    Anxiety and depression      Plan: We discussed her concerns.  She has already discontinued gabapentin several weeks ago so no need to do any titration at this point.  I did advise she go ahead and take Benadryl either 12.5 mg or 25 mg once or twice a day for least the next week given her ongoing anxiety and itching  Labs as below today.  We discussed each of her other medicines today and she is comfortable staying on her current regimen..  We discussed pros and cons of each medication.  We discussed her overall medical conditions.  I did encourage her to get back into water aerobics in the gym regularly and try to get her husband to go with her to exercise most days per week  Follow-up next week  with hematology as planned regarding anemia and recheck on iron and blood counts  Sandra Ruiz was seen today for consult.  Diagnoses and all orders for this visit:  Pruritic condition -     Comprehensive metabolic panel  Hypothyroidism, unspecified type  Hyperlipidemia, unspecified hyperlipidemia type  High risk medication use  Fatty liver  Chronic anemia  Anxiety and depression    Follow up: 3-35mo

## 2023-02-16 ENCOUNTER — Telehealth: Payer: Self-pay

## 2023-02-16 LAB — COMPREHENSIVE METABOLIC PANEL
ALT: 14 [IU]/L (ref 0–32)
AST: 24 [IU]/L (ref 0–40)
Albumin: 4.3 g/dL (ref 3.8–4.8)
Alkaline Phosphatase: 91 [IU]/L (ref 44–121)
BUN/Creatinine Ratio: 9 — ABNORMAL LOW (ref 12–28)
BUN: 7 mg/dL — ABNORMAL LOW (ref 8–27)
Bilirubin Total: 0.4 mg/dL (ref 0.0–1.2)
CO2: 20 mmol/L (ref 20–29)
Calcium: 9.3 mg/dL (ref 8.7–10.3)
Chloride: 99 mmol/L (ref 96–106)
Creatinine, Ser: 0.75 mg/dL (ref 0.57–1.00)
Globulin, Total: 2.1 g/dL (ref 1.5–4.5)
Glucose: 83 mg/dL (ref 70–99)
Potassium: 4.1 mmol/L (ref 3.5–5.2)
Sodium: 137 mmol/L (ref 134–144)
Total Protein: 6.4 g/dL (ref 6.0–8.5)
eGFR: 83 mL/min/{1.73_m2} (ref >59)

## 2023-02-16 NOTE — Progress Notes (Signed)
Results sent through MyChart

## 2023-02-16 NOTE — Telephone Encounter (Signed)
Alert remote transmission: tachy-recurrent alert 1 tachy event x 11 min 10 sec with V rates 176 bpm.  Activity sensor is active.   Likely atrial driven as in the past.   As in past, intermittent over and undersensing.    Detection is at 158 bpm at 48 beats.   CV solutions:  Recommends increasing the detection rate to 182 bpm and shortening the duration to 24 beats to monitor for actionable findings.   Sending for review.   Sending to Dr. Nelly Laurence for approval request.

## 2023-02-17 NOTE — Telephone Encounter (Signed)
Noted, changes made in Carelink. 182bpm for 48 beats (42 not option) for Tachy alert.

## 2023-02-18 ENCOUNTER — Telehealth: Payer: Self-pay | Admitting: Licensed Clinical Social Worker

## 2023-02-18 NOTE — Patient Outreach (Signed)
  Care Coordination   02/18/2023 Name: Sandra Ruiz MRN: [id number] DOB: 06-03-1945   Care Coordination Outreach Attempts:  An unsuccessful telephone outreach was attempted today to offer the patient information about available complex care management services.  Follow Up Plan:  Additional outreach attempts will be made to offer the patient complex care management information and services.   Encounter Outcome:  No Answer   Care Coordination Interventions:  No, not indicated    Jenel Lucks, LCSW [place]  [facility], [facility] Clinical Social Worker Direct Dial: [phone]  Fax: [phone] Website: [email or web address] 5:12 PM

## 2023-02-19 ENCOUNTER — Other Ambulatory Visit: Payer: Self-pay | Admitting: Medical

## 2023-02-21 ENCOUNTER — Ambulatory Visit (INDEPENDENT_AMBULATORY_CARE_PROVIDER_SITE_OTHER): Payer: Medicare Other

## 2023-02-21 DIAGNOSIS — I4819 Other persistent atrial fibrillation: Secondary | ICD-10-CM | POA: Diagnosis not present

## 2023-02-22 LAB — CUP PACEART REMOTE DEVICE CHECK
Date Time Interrogation Session: 20250216231354
Implantable Pulse Generator Implant Date: 20231103

## 2023-02-25 ENCOUNTER — Inpatient Hospital Stay: Payer: Medicare Other | Attending: Hematology and Oncology

## 2023-02-25 DIAGNOSIS — D649 Anemia, unspecified: Secondary | ICD-10-CM

## 2023-02-25 DIAGNOSIS — D509 Iron deficiency anemia, unspecified: Secondary | ICD-10-CM | POA: Diagnosis not present

## 2023-02-25 LAB — CBC WITH DIFFERENTIAL (CANCER CENTER ONLY)
Abs Immature Granulocytes: 0.02 10*3/uL (ref 0.00–0.07)
Basophils Absolute: 0.1 10*3/uL (ref 0.0–0.1)
Basophils Relative: 1 %
Eosinophils Absolute: 0.1 10*3/uL (ref 0.0–0.5)
Eosinophils Relative: 1 %
HCT: 39.1 % (ref 36.0–46.0)
Hemoglobin: 13.5 g/dL (ref 12.0–15.0)
Immature Granulocytes: 0 %
Lymphocytes Relative: 24 %
Lymphs Abs: 1.4 10*3/uL (ref 0.7–4.0)
MCH: 29.8 pg (ref 26.0–34.0)
MCHC: 34.5 g/dL (ref 30.0–36.0)
MCV: 86.3 fL (ref 80.0–100.0)
Monocytes Absolute: 0.4 10*3/uL (ref 0.1–1.0)
Monocytes Relative: 7 %
Neutro Abs: 3.7 10*3/uL (ref 1.7–7.7)
Neutrophils Relative %: 67 %
Platelet Count: 236 10*3/uL (ref 150–400)
RBC: 4.53 MIL/uL (ref 3.87–5.11)
RDW: 11.9 % (ref 11.5–15.5)
WBC Count: 5.6 10*3/uL (ref 4.0–10.5)
nRBC: 0 % (ref 0.0–0.2)

## 2023-02-25 LAB — IRON AND IRON BINDING CAPACITY (CC-WL,HP ONLY)
Iron: 103 ug/dL (ref 28–170)
Saturation Ratios: 33 % — ABNORMAL HIGH (ref 10.4–31.8)
TIBC: 314 ug/dL (ref 250–450)
UIBC: 211 ug/dL (ref 148–442)

## 2023-02-25 LAB — FERRITIN: Ferritin: 51 ng/mL (ref 11–307)

## 2023-02-28 ENCOUNTER — Ambulatory Visit: Payer: Medicare Other

## 2023-03-01 ENCOUNTER — Inpatient Hospital Stay (HOSPITAL_BASED_OUTPATIENT_CLINIC_OR_DEPARTMENT_OTHER): Payer: Medicare Other | Admitting: Hematology and Oncology

## 2023-03-01 DIAGNOSIS — D649 Anemia, unspecified: Secondary | ICD-10-CM | POA: Diagnosis not present

## 2023-03-01 NOTE — Progress Notes (Signed)
 Carelink Summary Report / Loop Recorder

## 2023-03-01 NOTE — Progress Notes (Signed)
 Patient Care Team: Tysinger, Kermit Balo, PA-C as PCP - General (Family Medicine) Jens Som Madolyn Frieze, MD as PCP - Cardiology (Cardiology) Mealor, Roberts Gaudy, MD as PCP - Electrophysiology (Cardiology) Verner Chol, [facility] (Inactive) as Pharmacist (Pharmacist)  DIAGNOSIS:  Encounter Diagnosis  Name Primary?   Chronic anemia Yes    SUMMARY OF ONCOLOGIC HISTORY: Oncology History   No history exists.    CHIEF COMPLIANT:   HISTORY OF PRESENT ILLNESS: Discussed the use of AI scribe software for clinical note transcription with the patient, who gave verbal consent to proceed.  History of Present Illness The patient, with a history of chronic pain managed with Gabapentin for thirteen years, presents after self-discontinuation of the medication. She reports experiencing withdrawal symptoms, but is now doing well without the medication. She also reports an increase in pain, particularly in her back and legs, but is not interested in resuming Gabapentin.  The patient had been experiencing anemia, with low iron and B12 levels, which she attributes to the long-term use of Gabapentin. Recent blood work shows that her hemoglobin, iron studies, and ferritin levels have returned to normal. She expresses satisfaction with the improvement in her blood work and is interested in reducing her overall medication load.     ALLERGIES:  is allergic to atorvastatin, baclofen, crestor [rosuvastatin], metformin and related, verapamil, silicone, and wellbutrin [bupropion].  MEDICATIONS:  Current Outpatient Medications  Medication Sig Dispense Refill   ACCU-CHEK GUIDE test strip USE 1 TEST STRIP 1 TO 2 TIMES  DAILY 200 strip 2   Accu-Chek Softclix Lancets lancets Use as instructed 90 each 3   ALPRAZolam (XANAX) 0.25 MG tablet Take 1 tablet (0.25 mg total) by mouth 2 (two) times daily as needed for anxiety. 20 tablet 0   Artificial Tears ophthalmic solution Place 1 drop into both eyes 3 (three) times daily as  needed (for dryness).     BENADRYL ALLERGY 25 MG tablet Take 25 mg by mouth every 6 (six) hours as needed for itching.     Blood Glucose Monitoring Suppl (ACCU-CHEK GUIDE ME) w/Device KIT Test 1-2 times daily 1 kit 0   diltiazem (CARDIZEM CD) 300 MG 24 hr capsule Take 1 capsule (300 mg total) by mouth daily. 90 capsule 2   diltiazem (CARDIZEM) 30 MG tablet TAKE 1 TABLET BY MOUTH EVERY 4 HOURS AS NEEDED FOR AFIB HEART RATE >100 AS LONG AS TOP BP >100 90 tablet 1   DULoxetine (CYMBALTA) 60 MG capsule TAKE 1 CAPSULE BY MOUTH TWICE  DAILY 200 capsule 1   ELIQUIS 5 MG TABS tablet TAKE 1 TABLET BY MOUTH TWICE  DAILY 200 tablet 2   ezetimibe (ZETIA) 10 MG tablet Take 1 tablet (10 mg total) by mouth daily. 100 tablet 1   furosemide (LASIX) 20 MG tablet TAKE 1 TABLET BY MOUTH EVERY DAY 90 tablet 1   levothyroxine (SYNTHROID) 75 MCG tablet TAKE 1 TABLET BY MOUTH DAILY  BEFORE BREAKFAST 90 tablet 3   MULTAQ 400 MG tablet TAKE 1 TABLET BY MOUTH TWICE  DAILY WITH A MEAL 200 tablet 2   nitroGLYCERIN (NITROSTAT) 0.4 MG SL tablet DISSOLVE 1 TABLET UNDER THE  TONGUE EVERY 5 MINUTES AS NEEDED FOR CHEST PAIN. MAX OF 3 TABLETS IN 15 MINUTES. CALL 911 IF PAIN  PERSISTS. (Patient taking differently: Place 0.4 mg under the tongue every 5 (five) minutes x 3 doses as needed for chest pain (CALL 9-1-1 IF PAIN PERSISTS).) 25 tablet 3   omeprazole (PRILOSEC) 40 MG  capsule Take 40 mg by mouth daily.     oxybutynin (DITROPAN-XL) 10 MG 24 hr tablet TAKE 1 TABLET BY MOUTH TWICE  DAILY 200 tablet 1   pravastatin (PRAVACHOL) 40 MG tablet TAKE 1 TABLET BY MOUTH IN THE  EVENING 100 tablet 1   tirzepatide (MOUNJARO) 7.5 MG/0.5ML Pen Inject 7.5 mg into the skin once a week. 6 mL 3   TYLENOL 500 MG tablet Take 500-1,000 mg by mouth every 6 (six) hours as needed for mild pain or headache.     No current facility-administered medications for this visit.    PHYSICAL EXAMINATION: ECOG PERFORMANCE STATUS: 1 - Symptomatic but completely  ambulatory  There were no vitals filed for this visit. There were no vitals filed for this visit.  Physical Exam   (exam performed in the presence of a chaperone)  LABORATORY DATA:  I have reviewed the data as listed    Latest Ref Rng & Units 02/15/2023   12:38 PM 08/09/2022   12:41 PM 08/04/2022    9:57 AM  CMP  Glucose 70 - 99 mg/dL 83  96  79   BUN 8 - 27 mg/dL 7  10  9    Creatinine 0.57 - 1.00 mg/dL 1.61  0.96  0.45   Sodium 134 - 144 mmol/L 137  136  139   Potassium 3.5 - 5.2 mmol/L 4.1  4.1  4.8   Chloride 96 - 106 mmol/L 99  102  103   CO2 20 - 29 mmol/L 20  28  21    Calcium 8.7 - 10.3 mg/dL 9.3  9.0  9.2   Total Protein 6.0 - 8.5 g/dL 6.4  5.8    Total Bilirubin 0.0 - 1.2 mg/dL 0.4  0.4    Alkaline Phos 44 - 121 IU/L 91  88    AST 0 - 40 IU/L 24  32    ALT 0 - 32 IU/L 14  16      Lab Results  Component Value Date   WBC 5.6 02/25/2023   HGB 13.5 02/25/2023   HCT 39.1 02/25/2023   MCV 86.3 02/25/2023   PLT 236 02/25/2023   NEUTROABS 3.7 02/25/2023    ASSESSMENT & PLAN:  Chronic anemia Microcytic iron deficiency anemia: Jehovah's Witness IV iron: July 2024   Hospitalization 07/23/2022-07/28/2022: Admitted with hemoglobin of 5.8, after 6 doses of IV iron and 1 dose of Aranesp, hemoglobin improved to 9 g on 08/04/2022 08/09/2022: Hemoglobin 9.5 g (patient baseline hemoglobin is between 10 and 11) 09/23/2022: Hemoglobin 12.2 12/08/2022: Hemoglobin 11.2, MCV 88.6, B12 778, iron saturation 22%, ferritin 35 02/25/23: Hemoglobin 13.5, MCV 86.3, Iron Sat: 33%, Ferritin: 51    Recommendation:   (Patient believes that gabapentin may be the reason for iron deficiency as she has read and reports that gabapentin can deplete iron)  Prior history of EGD and colonoscopy negative, capsule endoscopy was also performed.  Takes Eliquis   Recheck CBC and iron studies in 6 months  No orders of the defined types were placed in this encounter.  The patient has a good understanding  of the overall plan. she agrees with it. she will call with any problems that may develop before the next visit here. Total time spent: 30 mins including face to face time and time spent for planning, charting and co-ordination of care   Tamsen Meek, MD 03/01/23

## 2023-03-01 NOTE — Assessment & Plan Note (Signed)
 Microcytic iron deficiency anemia: Jehovah's Witness IV iron: July 2024   Hospitalization 07/23/2022-07/28/2022: Admitted with hemoglobin of 5.8, after 6 doses of IV iron and 1 dose of Aranesp, hemoglobin improved to 9 g on 08/04/2022 08/09/2022: Hemoglobin 9.5 g (patient baseline hemoglobin is between 10 and 11) 09/23/2022: Hemoglobin 12.2 12/08/2022: Hemoglobin 11.2, MCV 88.6, B12 778, iron saturation 22%, ferritin 35   Recommendation:  Recheck CBC and iron studies in 3 months   Will consider Aranesp if needed   Prior history of EGD and colonoscopy negative, capsule endoscopy was also performed.  Takes Eliquis

## 2023-03-02 ENCOUNTER — Telehealth: Payer: Self-pay | Admitting: Hematology and Oncology

## 2023-03-02 NOTE — Telephone Encounter (Signed)
 Scheduled appointments per 2/25 los. Left VM with appointment details.

## 2023-03-06 ENCOUNTER — Encounter: Payer: Self-pay | Admitting: Cardiovascular Disease

## 2023-03-14 ENCOUNTER — Other Ambulatory Visit: Payer: Medicare Other

## 2023-03-16 DIAGNOSIS — M48062 Spinal stenosis, lumbar region with neurogenic claudication: Secondary | ICD-10-CM | POA: Diagnosis not present

## 2023-03-17 ENCOUNTER — Telehealth: Payer: Medicare Other | Admitting: Hematology and Oncology

## 2023-03-28 ENCOUNTER — Ambulatory Visit (INDEPENDENT_AMBULATORY_CARE_PROVIDER_SITE_OTHER): Payer: Medicare Other

## 2023-03-28 DIAGNOSIS — I4819 Other persistent atrial fibrillation: Secondary | ICD-10-CM

## 2023-03-28 LAB — CUP PACEART REMOTE DEVICE CHECK
Date Time Interrogation Session: 20250323231310
Implantable Pulse Generator Implant Date: 20231103

## 2023-03-30 ENCOUNTER — Emergency Department (HOSPITAL_COMMUNITY)
Admission: EM | Admit: 2023-03-30 | Discharge: 2023-03-30 | Disposition: A | Discharge status: Against Medical Advice | Attending: Emergency Medicine | Admitting: Emergency Medicine

## 2023-03-30 ENCOUNTER — Telehealth (HOSPITAL_COMMUNITY): Payer: Self-pay

## 2023-03-30 ENCOUNTER — Emergency Department (HOSPITAL_COMMUNITY)

## 2023-03-30 ENCOUNTER — Encounter: Payer: Self-pay | Admitting: Internal Medicine

## 2023-03-30 ENCOUNTER — Other Ambulatory Visit: Payer: Self-pay

## 2023-03-30 ENCOUNTER — Encounter (HOSPITAL_COMMUNITY): Payer: Self-pay

## 2023-03-30 DIAGNOSIS — I4891 Unspecified atrial fibrillation: Secondary | ICD-10-CM | POA: Insufficient documentation

## 2023-03-30 DIAGNOSIS — R079 Chest pain, unspecified: Secondary | ICD-10-CM | POA: Diagnosis not present

## 2023-03-30 DIAGNOSIS — Z8616 Personal history of COVID-19: Secondary | ICD-10-CM | POA: Diagnosis not present

## 2023-03-30 DIAGNOSIS — I959 Hypotension, unspecified: Secondary | ICD-10-CM | POA: Diagnosis not present

## 2023-03-30 DIAGNOSIS — Z8673 Personal history of transient ischemic attack (TIA), and cerebral infarction without residual deficits: Secondary | ICD-10-CM | POA: Insufficient documentation

## 2023-03-30 DIAGNOSIS — Z7901 Long term (current) use of anticoagulants: Secondary | ICD-10-CM | POA: Diagnosis not present

## 2023-03-30 DIAGNOSIS — I509 Heart failure, unspecified: Secondary | ICD-10-CM | POA: Insufficient documentation

## 2023-03-30 DIAGNOSIS — R531 Weakness: Secondary | ICD-10-CM | POA: Diagnosis not present

## 2023-03-30 DIAGNOSIS — Z794 Long term (current) use of insulin: Secondary | ICD-10-CM | POA: Insufficient documentation

## 2023-03-30 LAB — CBC
HCT: 36.8 % (ref 36.0–46.0)
Hemoglobin: 12.6 g/dL (ref 12.0–15.0)
MCH: 30.4 pg (ref 26.0–34.0)
MCHC: 34.2 g/dL (ref 30.0–36.0)
MCV: 88.7 fL (ref 80.0–100.0)
Platelets: 255 10*3/uL (ref 150–400)
RBC: 4.15 MIL/uL (ref 3.87–5.11)
RDW: 12.5 % (ref 11.5–15.5)
WBC: 9.8 10*3/uL (ref 4.0–10.5)
nRBC: 0 % (ref 0.0–0.2)

## 2023-03-30 LAB — BASIC METABOLIC PANEL
Anion gap: 9 (ref 5–15)
BUN: 12 mg/dL (ref 8–23)
CO2: 26 mmol/L (ref 22–32)
Calcium: 9.1 mg/dL (ref 8.9–10.3)
Chloride: 100 mmol/L (ref 98–111)
Creatinine, Ser: 1.28 mg/dL — ABNORMAL HIGH (ref 0.44–1.00)
GFR, Estimated: 44 mL/min — ABNORMAL LOW (ref >60)
Glucose, Bld: 87 mg/dL (ref 70–99)
Potassium: 4.4 mmol/L (ref 3.5–5.1)
Sodium: 135 mmol/L (ref 135–145)

## 2023-03-30 LAB — RESP PANEL BY RT-PCR (RSV, FLU A&B, COVID)  RVPGX2
Influenza A by PCR: NEGATIVE
Influenza B by PCR: NEGATIVE
Resp Syncytial Virus by PCR: NEGATIVE
SARS Coronavirus 2 by RT PCR: NEGATIVE

## 2023-03-30 LAB — TROPONIN I (HIGH SENSITIVITY)
Troponin I (High Sensitivity): 5 ng/L (ref <18)
Troponin I (High Sensitivity): 4 ng/L (ref <18)

## 2023-03-30 LAB — I-STAT CG4 LACTIC ACID, ED: Lactic Acid, Venous: 0.5 mmol/L (ref 0.5–1.9)

## 2023-03-30 MED ORDER — SODIUM CHLORIDE 0.9 % IV BOLUS: 500.0000 mL | Freq: Once | INTRAVENOUS | Status: DC

## 2023-03-30 MED ORDER — LORAZEPAM 2 MG/ML IJ SOLN: 0.5000 mg | Freq: Once | INTRAMUSCULAR | Status: DC

## 2023-03-30 NOTE — Telephone Encounter (Signed)
 Patient called because she is not feeling well. She is dizzy and lightheaded. She checked her blood pressure and HR. Her blood pressure was 68/40 and HR 82. Discussed with Clint Fenton-PA he checked her device and she is not currently in A-fib. Patient was told to go to the ER or call 911 to be evaluated. Consulted with patient and she verbalized understanding.

## 2023-03-30 NOTE — ED Provider Notes (Signed)
  EMERGENCY DEPARTMENT AT [facility] Provider Note   CSN: [id number] Arrival date & time: 03/30/23  1657     History  Chief Complaint  Patient presents with   Weakness    Sandra Ruiz is a 78 y.o. female history of TIA, A-fib on Eliquis, Jehovah witness, CHF presented for weakness that began at 3:00 today.  [place] when she felt generally weak and had to sit down for symptoms to resolve.  Patient states last week she did have chest pain that got better with nitro but states she also had COVID last week.  Patient has any chest pain or shortness of breath here along with vision changes or syncope but is unsure as to why she felt so weak.  When patient got home her blood pressure was 65 systolic and so that is what prompted her to go to the ER.  Patient denies any melena or hematochezia.  Patient is currently asymptomatic.  Patient and husband do note that the patient 2 hours worth of slurred speech.    Home Medications Prior to Admission medications   Medication Sig Start Date End Date Taking? Authorizing Provider  ACCU-CHEK GUIDE test strip USE 1 TEST STRIP 1 TO 2 TIMES  DAILY 09/23/22   Tysinger, Kermit Balo, PA-C  Accu-Chek Softclix Lancets lancets Use as instructed 07/21/22   Tysinger, Kermit Balo, PA-C  ALPRAZolam Prudy Feeler) 0.25 MG tablet Take 1 tablet (0.25 mg total) by mouth 2 (two) times daily as needed for anxiety. 10/13/22   Tysinger, Kermit Balo, PA-C  Artificial Tears ophthalmic solution Place 1 drop into both eyes 3 (three) times daily as needed (for dryness).    [provider]  BENADRYL ALLERGY 25 MG tablet Take 25 mg by mouth every 6 (six) hours as needed for itching.    [provider]  Blood Glucose Monitoring Suppl (ACCU-CHEK GUIDE ME) w/Device KIT Test 1-2 times daily 05/14/22   Tysinger, Kermit Balo, PA-C  diltiazem (CARDIZEM CD) 300 MG 24 hr capsule Take 1 capsule (300 mg total) by mouth daily. 09/23/22   Fenton, Clint  R, PA  diltiazem (CARDIZEM) 30 MG tablet TAKE 1 TABLET BY MOUTH EVERY 4 HOURS AS NEEDED FOR AFIB HEART RATE >100 AS LONG AS TOP BP >100 04/02/22   Fenton, Clint R, PA  DULoxetine (CYMBALTA) 60 MG capsule TAKE 1 CAPSULE BY MOUTH TWICE  DAILY 10/21/22   Tysinger, Kermit Balo, PA-C  ELIQUIS 5 MG TABS tablet TAKE 1 TABLET BY MOUTH TWICE  DAILY 10/11/22   Tysinger, Kermit Balo, PA-C  ezetimibe (ZETIA) 10 MG tablet Take 1 tablet (10 mg total) by mouth daily. 10/11/22   Tysinger, Kermit Balo, PA-C  furosemide (LASIX) 20 MG tablet TAKE 1 TABLET BY MOUTH EVERY DAY 01/10/23   Tysinger, Kermit Balo, PA-C  levothyroxine (SYNTHROID) 75 MCG tablet TAKE 1 TABLET BY MOUTH DAILY  BEFORE BREAKFAST 10/08/22   Carlus Pavlov, MD  MULTAQ 400 MG tablet TAKE 1 TABLET BY MOUTH TWICE  DAILY WITH A MEAL 10/12/22   Lewayne Bunting, MD  nitroGLYCERIN (NITROSTAT) 0.4 MG SL tablet DISSOLVE 1 TABLET UNDER THE  TONGUE EVERY 5 MINUTES AS NEEDED FOR CHEST PAIN. MAX OF 3 TABLETS IN 15 MINUTES. CALL 911 IF PAIN  PERSISTS. Patient taking differently: Place 0.4 mg under the tongue every 5 (five) minutes x 3 doses as needed for chest pain (CALL 9-1-1 IF PAIN PERSISTS). 05/11/22   Lewayne Bunting, MD  omeprazole (PRILOSEC) 40 MG capsule Take 40 mg by mouth daily.    [provider]  oxybutynin (DITROPAN-XL) 10 MG 24 hr tablet TAKE 1 TABLET BY MOUTH TWICE  DAILY 12/20/22   Tysinger, Kermit Balo, PA-C  pravastatin (PRAVACHOL) 40 MG tablet TAKE 1 TABLET BY MOUTH IN THE  EVENING 01/31/23   Tysinger, Kermit Balo, PA-C  tirzepatide [facility]) 7.5 MG/0.5ML Pen Inject 7.5 mg into the skin once a week. 10/29/22   Tysinger, Kermit Balo, PA-C  TYLENOL 500 MG tablet Take 500-1,000 mg by mouth every 6 (six) hours as needed for mild pain or headache.    [provider]      Allergies    Atorvastatin, Baclofen, Crestor [rosuvastatin], Metformin and related, Verapamil, Silicone, and Wellbutrin [bupropion]    Review of Systems   Review of Systems  Neurological:   Positive for weakness.    Physical Exam Updated Vital Signs BP 123/70   Pulse 72   Temp 98.3 F (36.8 C) (Oral)   Resp 18   Ht 5\' 1"  (1.549 m)   Wt 64 kg   SpO2 99%   BMI 26.64 kg/m  Physical Exam Vitals reviewed.  Constitutional:      General: She is not in acute distress. HENT:     Head: Normocephalic and atraumatic.  Eyes:     Extraocular Movements: Extraocular movements intact.     Conjunctiva/sclera: Conjunctivae normal.     Pupils: Pupils are equal, round, and reactive to light.  Cardiovascular:     Rate and Rhythm: Normal rate and regular rhythm.     Pulses: Normal pulses.     Heart sounds: Normal heart sounds.     Comments: 2+ bilateral radial/dorsalis pedis pulses with regular rate Pulmonary:     Effort: Pulmonary effort is normal. No respiratory distress.     Breath sounds: Normal breath sounds.  Abdominal:     Palpations: Abdomen is soft.     Tenderness: There is no abdominal tenderness. There is no guarding or rebound.  Musculoskeletal:        General: Normal range of motion.     Cervical back: Normal range of motion and neck supple.     Comments: 5 out of 5 bilateral grip/leg extension strength  Skin:    General: Skin is warm and dry.     Capillary Refill: Capillary refill takes less than 2 seconds.  Neurological:     General: No focal deficit present.     Mental Status: She is alert and oriented to person, place, and time.     Sensory: Sensation is intact.     Motor: Motor function is intact.     Coordination: Coordination is intact.     Gait: Gait is intact.     Comments: Sensation intact in all 4 limbs Cranial nerves III through XII intact Vision grossly intact  Psychiatric:        Mood and Affect: Mood normal.     ED Results / Procedures / Treatments   Labs (all labs ordered are listed, but only abnormal results are displayed) Labs Reviewed  BASIC METABOLIC PANEL - Abnormal; Notable for the following components:      Result Value    Creatinine, Ser 1.28 (*)    GFR, Estimated 44 (*)    All other components within normal limits  RESP PANEL BY RT-PCR (RSV, FLU A&B, COVID)  RVPGX2  CBC  URINALYSIS, ROUTINE W REFLEX MICROSCOPIC  CBG MONITORING, ED  I-STAT CG4 LACTIC ACID,  ED  TROPONIN I (HIGH SENSITIVITY)  TROPONIN I (HIGH SENSITIVITY)    EKG EKG Interpretation Date/Time:  Wednesday March 30 2023 17:26:27 EDT Ventricular Rate:  83 PR Interval:  156 QRS Duration:  92 QT Interval:  402 QTC Calculation: 472 R Axis:   74  Text Interpretation: Sinus rhythm with Premature atrial complexes Otherwise normal ECG Confirmed by Vonita Moss [phone]) on 03/30/2023 9:01:34 PM  Radiology DG Chest Port 1 View Result Date: 03/30/2023 CLINICAL DATA:  Chest pain and weakness EXAM: PORTABLE CHEST 1 VIEW COMPARISON:  08/02/2022 FINDINGS: Cardiac shadow is within normal limits. Lungs are well aerated bilaterally. No focal infiltrate or effusion is seen. No bony abnormality is noted. IMPRESSION: No active disease. Electronically Signed   By: Alcide Clever M.D.   On: 03/30/2023 21:00    Procedures Procedures    Medications Ordered in ED Medications - No data to display  ED Course/ Medical Decision Making/ A&P Clinical Course as of 03/30/23 2253  Wed Mar 30, 2023  2218 Creatinine(!): 1.28 Baseline of 0.7 [RP]    Clinical Course User Index [RP] Rondel Baton, MD                                 Medical Decision Making Amount and/or Complexity of Data Reviewed Labs: ordered. Decision-making details documented in ED Course. Radiology: ordered.   Ander Gaster 78 y.o. presented today for weakness.  Working DDx that I considered at this time includes, but not limited to, anemia, electrolyte abnormalities, viral illness, pneumonia, ACS, CVA/TIA, spinal cord pathology, GBS, rhabdomyolysis, alcohol induced/drug-induced, sepsis, hypothyroidism, severe dehydration  R/o DDx: Cannot fully assess patient wants to leave  AMA  Review of prior external notes: 03/01/2023 office visit  Unique Tests and My Independent Interpretation:  EKG: Sinus 83 bpm, no ST elevations or depressions CBC: Unremarkable CMP: Unremarkable Chest x-ray: No acute findings Troponin: 5, 4 Lactic acid: Negative  Social Determinants of Health: none  Discussion with Independent Historian:  Husband  Discussion of Management of Tests: None  Risk: Low: based on diagnostic testing/clinical impression and treatment plan  Risk Stratification Score: None  Staffed with Eloise Harman, MD  Plan: On exam patient was in no acute distress with stable vitals.  Patient's blood pressure initially was hypotensive here in the 90s however did respond without fluids.  Patient's neuroexam was reassuring along with physical exam however patient and husband are endorsing slurred speech for 2 hours and patient does have history of TIA and so concerned that patient may have had another TIA and so we will order MRI to further evaluate.  Patient has been taking her medications as prescribed has not missed any doses including her blood thinners.  Labs in triage are ultimately reassuring however there is a slight bump in her creatinine and so do's suspect there could have been hypoperfusion to the brain causing her symptoms as well.  Attending went to go evaluate the patient patient states that she would like to leave and does not want further imaging or fluids.  Attending spoke to the patient about signs symptoms of stroke as they do not want MRI and would like to be discharged.  Delta troponin is negative so at this time patient safe to be discharged.  Patient wants to leave against medical advice. Patient understands that his/her actions will lead to inadequate medical workup, and that he/she is at risk of complications of missed diagnosis, which  includes morbidity and mortality.  Alternative options discussed  Opportunity to change mind given  Patient is  demonstrating good capacity to make decision. Patient understands that he/she needs to return to the ER immediately if his/her symptoms get worse.  Patient was given return precautions. Patient stable for discharge at this time.  Patient verbalized understanding of plan.  This chart was dictated using voice recognition software.  Despite best efforts to proofread,  errors can occur which can change the documentation meaning.        Final Clinical Impression(s) / ED Diagnoses Final diagnoses:  Weakness    Rx / DC Orders ED Discharge Orders     None         Remi Deter 03/30/23 2253    Rondel Baton, MD 04/05/23 1407

## 2023-03-30 NOTE — ED Triage Notes (Addendum)
 Pt sudden onset of whole body weakness today when she was walking. She took her BP when she got home and it was 68/40 and was told to come to the ER. She A&Ox4, skin warm and dry. Denies any acute pain. This happened in July and was told her hgb was low, She refuses blood products so she received an iron infusion and B12 injections instead. She currently denies any active bleeding.

## 2023-03-30 NOTE — Discharge Instructions (Addendum)
 Please follow-up with your primary care provider regards [facility] ER visit.  Today your labs and imaging are reassuring however at this time you are asking to be discharged AGAINST MEDICAL ADVICE.  We did offer an MRI however you do not want to stay for this.  We have discussed with you signs and symptoms of a stroke and if you do begin to have the symptoms please return to the ER immediately.

## 2023-03-31 ENCOUNTER — Telehealth: Payer: Self-pay

## 2023-03-31 NOTE — Progress Notes (Signed)
 Carelink Summary Report / Loop Recorder

## 2023-03-31 NOTE — Transitions of Care (Post Inpatient/ED Visit) (Signed)
 03/31/2023  Name: Sandra Ruiz MRN: [id number] DOB: 1945-05-19  Today's TOC FU Call Status: Today's TOC FU Call Status:: Successful TOC FU Call Completed TOC FU Call Complete Date: 03/31/23 Patient's Name and Date of Birth confirmed.  Transition Care Management Follow-up Telephone Call Date of Discharge: 03/30/23 Discharge Facility: [facility] Type of Discharge: Emergency Department Reason for ED Visit: Other: (Weakness) How have you been since you were released from the hospital?: Better Any questions or concerns?: Yes Patient Questions/Concerns:: Patient was not satisfied with care received and that she was cared for mostly by residents per her account  Items Reviewed: Did you receive and understand the discharge instructions provided?: Yes Medications obtained,verified, and reconciled?: Yes (Medications Reviewed) Any new allergies since your discharge?: No Dietary orders reviewed?: NA Do you have support at home?: Yes People in Home: spouse  Medications Reviewed Today: Medications Reviewed Today     Reviewed by Anthoney Harada, LPN (Licensed Practical Nurse) on 03/31/23 at 1203  Med List Status: <None>   Medication Order Taking? Sig Documenting Provider Last Dose Status Informant  ACCU-CHEK GUIDE test strip [id number] Yes USE 1 TEST STRIP 1 TO 2 TIMES  DAILY Tysinger, Kermit Balo, PA-C Taking Active   Accu-Chek Softclix Lancets lancets [id number] Yes Use as instructed Tysinger, Kermit Balo, PA-C Taking Active   ALPRAZolam Prudy Feeler) 0.25 MG tablet [id number] Yes Take 1 tablet (0.25 mg total) by mouth 2 (two) times daily as needed for anxiety. Jac Canavan, PA-C Taking Active   Artificial Tears ophthalmic solution [id number] Yes Place 1 drop into both eyes 3 (three) times daily as needed (for dryness). [provider] Taking Active Self  BENADRYL ALLERGY 25 MG tablet [id number] Yes Take 25 mg by mouth every 6 (six) hours as needed for itching. [provider]  Taking Active Self  Blood Glucose Monitoring Suppl (ACCU-CHEK GUIDE ME) w/Device KIT [id number] Yes Test 1-2 times daily TysingerKermit Balo, PA-C Taking Active   diltiazem (CARDIZEM CD) 300 MG 24 hr capsule [id number] Yes Take 1 capsule (300 mg total) by mouth daily. Fenton, Olar R, [place] Taking Active   diltiazem (CARDIZEM) 30 MG tablet [id number] Yes TAKE 1 TABLET BY MOUTH EVERY 4 HOURS AS NEEDED FOR AFIB HEART RATE >100 AS LONG AS TOP BP >100 Fenton, Clint R, PA Taking Active Self  DULoxetine (CYMBALTA) 60 MG capsule [id number] Yes TAKE 1 CAPSULE BY MOUTH TWICE  DAILY Tysinger, Kermit Balo, PA-C Taking Active   ELIQUIS 5 MG TABS tablet [id number] Yes TAKE 1 TABLET BY MOUTH TWICE  DAILY Tysinger, Kermit Balo, PA-C Taking Active   ezetimibe (ZETIA) 10 MG tablet [id number] Yes Take 1 tablet (10 mg total) by mouth daily. Tysinger, Kermit Balo, PA-C Taking Active   furosemide (LASIX) 20 MG tablet [id number] Yes TAKE 1 TABLET BY MOUTH EVERY DAY Jac Canavan, PA-C Taking Active   levothyroxine (SYNTHROID) 75 MCG tablet [id number] Yes TAKE 1 TABLET BY MOUTH DAILY  BEFORE Bari Mantis, MD Taking Active   MULTAQ 400 MG tablet [id number] Yes TAKE 1 TABLET BY MOUTH TWICE  DAILY WITH A MEAL Crenshaw, Madolyn Frieze, MD Taking Active   nitroGLYCERIN (NITROSTAT) 0.4 MG SL tablet [id number] Yes DISSOLVE 1 TABLET UNDER THE  TONGUE EVERY 5 MINUTES AS NEEDED FOR CHEST PAIN. MAX OF 3 TABLETS IN 15 MINUTES. CALL 911 IF PAIN  PERSISTS.  Patient taking differently: Place 0.4 mg under the tongue every 5 (five) minutes x 3 doses as needed for chest pain (  CALL 9-1-1 IF PAIN PERSISTS).   Lewayne Bunting, MD Taking Active Self  omeprazole (PRILOSEC) 40 MG capsule [id number] Yes Take 40 mg by mouth daily. [provider] Taking Active   oxybutynin (DITROPAN-XL) 10 MG 24 hr tablet [id number] Yes TAKE 1 TABLET BY MOUTH TWICE  DAILY Tysinger, Kermit Balo, PA-C Taking Active   pravastatin (PRAVACHOL) 40 MG tablet [id number] Yes TAKE 1  TABLET BY MOUTH IN THE  EVENING Tysinger, Kermit Balo, PA-C Taking Active   tirzepatide [facility]) 7.5 MG/0.5ML Pen [id number] Yes Inject 7.5 mg into the skin once a week. Tysinger, Kermit Balo, PA-C Taking Active   TYLENOL 500 MG tablet [id number] Yes Take 500-1,000 mg by mouth every 6 (six) hours as needed for mild pain or headache. [provider] Taking Active Self            Home Care and Equipment/Supplies: Were Home Health Services Ordered?: NA Any new equipment or medical supplies ordered?: NA  Functional Questionnaire: Do you need assistance with bathing/showering or dressing?: No Do you need assistance with meal preparation?: No Do you need assistance with eating?: No Do you have difficulty maintaining continence: No Do you need assistance with getting out of bed/getting out of a chair/moving?: No Do you have difficulty managing or taking your medications?: No  Follow up appointments reviewed: PCP Follow-up appointment confirmed?: NA Specialist Hospital Follow-up appointment confirmed?: NA Do you need transportation to your follow-up appointment?: No Do you understand care options if your condition(s) worsen?: Yes-patient verbalized understanding    SIGNATURE Kandis Fantasia, LPN [facility] Health Advisor [place] [facility] You Are. We Are. One [facility] Direct Dial [phone]

## 2023-03-31 NOTE — Addendum Note (Signed)
 Addended by: Geralyn Flash D on: 03/31/2023 04:12 PM   Modules accepted: Orders

## 2023-04-04 ENCOUNTER — Ambulatory Visit: Payer: Medicare Other

## 2023-04-04 ENCOUNTER — Telehealth: Payer: Self-pay | Admitting: Cardiology

## 2023-04-04 NOTE — Progress Notes (Unsigned)
 Cardiology Clinic Note   Patient Name: Sandra Ruiz Date of Encounter: 04/06/2023  Primary Care Provider:  Jac Canavan, PA-C Primary Cardiologist:  Olga Millers, MD  Patient Profile    Sandra Ruiz 78 year old female presents to the clinic today for follow-up evaluation of her hypertension.  Past Medical History    Past Medical History:  Diagnosis Date   Chronic pain    Depression    Diabetes mellitus without complication (HCC)    GERD (gastroesophageal reflux disease)    History of TIA (transient ischemic attack)    Hyperlipidemia    Hyperopia of both eyes with astigmatism and presbyopia 12/21/2019   Hypertension    Lyme disease    Paroxysmal atrial fibrillation (HCC)    Sleep apnea    Thyroid disease    Past Surgical History:  Procedure Laterality Date   ATRIAL FIBRILLATION ABLATION     x2 in [place]   CARDIAC CATHETERIZATION     CARDIOVERSION N/A 03/05/2021   Procedure: CARDIOVERSION;  Surgeon: Meriam Sprague, MD;  Location: [facility] ENDOSCOPY;  Service: Cardiovascular;  Laterality: N/A;   CERVICAL SPINE SURGERY     GIVENS CAPSULE STUDY N/A 07/25/2022   Procedure: GIVENS CAPSULE STUDY;  Surgeon: Lynann Bologna, DO;  Location: [facility] ENDOSCOPY;  Service: Gastroenterology;  Laterality: N/A;   implantable loop recorder placement  11/07/2017   MDT LINQ1 implanted in [place] for afib management by Dr Thedore Mins   LOOP RECORDER INSERTION N/A 11/06/2021   Procedure: LOOP RECORDER INSERTION;  Surgeon: Maurice Small, MD;  Location: [facility] INVASIVE CV LAB;  Service: Cardiovascular;  Laterality: N/A;   LOOP RECORDER REMOVAL N/A 11/06/2021   Procedure: LOOP RECORDER REMOVAL;  Surgeon: Maurice Small, MD;  Location: [facility] INVASIVE CV LAB;  Service: Cardiovascular;  Laterality: N/A;   LUMBAR SPINE SURGERY     TONSILLECTOMY AND ADENOIDECTOMY      Allergies  Allergies  Allergen Reactions   Atorvastatin Other (See Comments)    Extreme myalgias   Baclofen Swelling   Crestor  [Rosuvastatin] Other (See Comments)    Extreme myalgias   Metformin And Related Nausea Only   Verapamil Swelling   Silicone Itching and Rash   Wellbutrin [Bupropion] Anxiety and Other (See Comments)    Hallucinations also    History of Present Illness    Sandra Ruiz has a PMH of TIA, atrial fibrillation on Eliquis, hyperlipidemia, hypothyroidism, hypertension, chronic diastolic CHF, coronary artery disease, anxiety and depression.  She was seen in follow-up by Otilio Saber, PA-C on 12/17/2022.  During that time she was doing well.  She did note some pain in her right shoulder.  She reported this as 5-6 out of 10.  She had not taken any nitroglycerin.  The discomfort did not bother her enough to get out of bed.  She had an epidural injection earlier in the week for back pain.  She denied chest pain, palpitations, dyspnea, lower extremity swelling, and weight gain.  She was seen in the emergency department on 03/30/2023.  Her husband reported that she had a 2-hour period of slurred speech.  She presented to the emergency department with complaint of weakness that began around 3:00 that day.  She [place] the [place].  She felt weak and sat down.  Her symptoms resolved.  She noted that the previous week she had chest pain that resolved with nitroglycerin.  She also reported having a COVID infection the previous week.  She denied  chest pain and shortness of breath.  She checked her blood pressure when she got home and it was noted to be 65 systolic.  This prompted her to go to the emergency department.  Her creatinine was noted to be 1.28.  Her blood pressure responded without fluids.  It was felt that her elevated creatinine and slurred speech were caused by hypoperfusion.  She did not wish to receive IV fluids or have further diagnostic testing.  She left AGAINST MEDICAL ADVICE.  She contacted the nurse triage line 04/04/2023.  She reported dizziness and that her blood pressure was lower  than normal.  She noted a 85 pound weight loss over the course of 12 months.  She was placed on my schedule.  She states she has had episodes like this prior to moving to [place].  We reviewed her emergency department visit.  She reported that she had COVID the week before and was walking in the mall when the episode happened.  We reviewed her most recent device interrogation that she has reassuring.  We reviewed her last echocardiogram.  She expressed understanding.  She did note an episode of chest discomfort after being to the emergency department where she took 2 sublingual nitroglycerin.  She has not had any further episodes of chest discomfort.  We reviewed her medications.  I will continue her current medication regimen.  Her blood pressure today is 120/70.  I will repeat her echocardiogram and plan follow-up in around 3 months.  Today she denies chest pain, shortness of breath, lower extremity edema, fatigue, palpitations, melena, hematuria, hemoptysis, diaphoresis, weakness, presyncope, syncope, orthopnea, and PND.    Home Medications    Prior to Admission medications   Medication Sig Start Date End Date Taking? Authorizing Provider  ACCU-CHEK GUIDE test strip USE 1 TEST STRIP 1 TO 2 TIMES  DAILY 09/23/22   Tysinger, Kermit Balo, PA-C  Accu-Chek Softclix Lancets lancets Use as instructed 07/21/22   Tysinger, Kermit Balo, PA-C  ALPRAZolam Prudy Feeler) 0.25 MG tablet Take 1 tablet (0.25 mg total) by mouth 2 (two) times daily as needed for anxiety. 10/13/22   Tysinger, Kermit Balo, PA-C  Artificial Tears ophthalmic solution Place 1 drop into both eyes 3 (three) times daily as needed (for dryness).    [provider]  BENADRYL ALLERGY 25 MG tablet Take 25 mg by mouth every 6 (six) hours as needed for itching.    [provider]  Blood Glucose Monitoring Suppl (ACCU-CHEK GUIDE ME) w/Device KIT Test 1-2 times daily 05/14/22   Tysinger, Kermit Balo, PA-C  diltiazem (CARDIZEM CD) 300 MG 24 hr capsule  Take 1 capsule (300 mg total) by mouth daily. 09/23/22   Fenton, Clint R, PA  diltiazem (CARDIZEM) 30 MG tablet TAKE 1 TABLET BY MOUTH EVERY 4 HOURS AS NEEDED FOR AFIB HEART RATE >100 AS LONG AS TOP BP >100 04/02/22   Fenton, Clint R, PA  DULoxetine (CYMBALTA) 60 MG capsule TAKE 1 CAPSULE BY MOUTH TWICE  DAILY 10/21/22   Tysinger, Kermit Balo, PA-C  ELIQUIS 5 MG TABS tablet TAKE 1 TABLET BY MOUTH TWICE  DAILY 10/11/22   Tysinger, Kermit Balo, PA-C  ezetimibe (ZETIA) 10 MG tablet Take 1 tablet (10 mg total) by mouth daily. 10/11/22   Tysinger, Kermit Balo, PA-C  furosemide (LASIX) 20 MG tablet TAKE 1 TABLET BY MOUTH EVERY DAY 01/10/23   Tysinger, Kermit Balo, PA-C  levothyroxine (SYNTHROID) 75 MCG tablet TAKE 1 TABLET BY MOUTH DAILY  BEFORE BREAKFAST 10/08/22  Carlus Pavlov, MD  MULTAQ 400 MG tablet TAKE 1 TABLET BY MOUTH TWICE  DAILY WITH A MEAL 10/12/22   Lewayne Bunting, MD  nitroGLYCERIN (NITROSTAT) 0.4 MG SL tablet DISSOLVE 1 TABLET UNDER THE  TONGUE EVERY 5 MINUTES AS NEEDED FOR CHEST PAIN. MAX OF 3 TABLETS IN 15 MINUTES. CALL 911 IF PAIN  PERSISTS. Patient taking differently: Place 0.4 mg under the tongue every 5 (five) minutes x 3 doses as needed for chest pain (CALL 9-1-1 IF PAIN PERSISTS). 05/11/22   Lewayne Bunting, MD  omeprazole (PRILOSEC) 40 MG capsule Take 40 mg by mouth daily.    [provider]  oxybutynin (DITROPAN-XL) 10 MG 24 hr tablet TAKE 1 TABLET BY MOUTH TWICE  DAILY 12/20/22   Tysinger, Kermit Balo, PA-C  pravastatin (PRAVACHOL) 40 MG tablet TAKE 1 TABLET BY MOUTH IN THE  EVENING 01/31/23   Tysinger, Kermit Balo, PA-C  tirzepatide [facility]) 7.5 MG/0.5ML Pen Inject 7.5 mg into the skin once a week. 10/29/22   Tysinger, Kermit Balo, PA-C  TYLENOL 500 MG tablet Take 500-1,000 mg by mouth every 6 (six) hours as needed for mild pain or headache.    [provider]    Family History    Family History  Problem Relation Age of Onset   CAD Mother    Thyroid disease Mother    CAD Father     Thyroid disease Daughter    Hypothyroidism Daughter    She indicated that her mother is deceased. She indicated that her father is deceased. She indicated that the status of her daughter is unknown.  Social History    Social History   Socioeconomic History   Marital status: Married    Spouse name: Not on file   Number of children: 5   Years of education: Not on file   Highest education level: Some college, no degree  Occupational History   Not on file  Tobacco Use   Smoking status: Former   Smokeless tobacco: Never   Tobacco comments:    Former smoker 11/12/2020  Vaping Use   Vaping status: Never Used  Substance and Sexual Activity   Alcohol use: Yes    Alcohol/week: 2.0 standard drinks of alcohol    Types: 2 Standard drinks or equivalent per week    Comment: mix drinks occ.   Drug use: Never   Sexual activity: Not on file  Other Topics Concern   Not on file  Social History Narrative   From [address], married, [place] [employer], new from [place] to [place]  07/2019   Right handed   Some caffeine use    Lives with husband, daughter, and son-in-law   Social Drivers of Corporate investment banker Strain: Low Risk  (10/12/2022)   Overall Financial Resource Strain (CARDIA)    Difficulty of Paying Living Expenses: Not hard at all  Food Insecurity: Food Insecurity Present (01/14/2023)   Hunger Vital Sign    Worried About Running Out of Food in the Last Year: Sometimes true    Ran Out of Food in the Last Year: Sometimes true  Transportation Needs: No Transportation Needs (01/14/2023)   PRAPARE - Administrator, Civil Service (Medical): No    Lack of Transportation (Non-Medical): No  Physical Activity: Sufficiently Active (10/12/2022)   Exercise Vital Sign    Days of Exercise per Week: 2 days    Minutes of Exercise per Session: 120 min  Stress: Stress Concern Present (  10/12/2022)   [employer] [employer] - Occupational Stress  Questionnaire    Feeling of Stress : To some extent  Social Connections: Moderately Isolated (10/12/2022)   Social Connection and Isolation Panel [NHANES]    Frequency of Communication with Friends and Family: Once a week    Frequency of Social Gatherings with Friends and Family: Never    Attends Religious Services: More than 4 times per year    Active Member of [employer] or Organizations: No    Attends Banker Meetings: Never    Marital Status: Married  Catering manager Violence: Not At Risk (01/14/2023)   Humiliation, Afraid, Rape, and Kick questionnaire    Fear of Current or Ex-Partner: No    Emotionally Abused: No    Physically Abused: No    Sexually Abused: No     Review of Systems    General:  No chills, fever, night sweats or weight changes.  Cardiovascular:  No chest pain, dyspnea on exertion, edema, orthopnea, palpitations, paroxysmal nocturnal dyspnea. Dermatological: No rash, lesions/masses Respiratory: No cough, dyspnea Urologic: No hematuria, dysuria Abdominal:   No nausea, vomiting, diarrhea, bright red blood per rectum, melena, or hematemesis Neurologic:  No visual changes, wkns, changes in mental status. All other systems reviewed and are otherwise negative except as noted above.  Physical Exam    VS:  BP 120/70 (BP Location: Left Arm, Patient Position: Sitting, Cuff Size: Normal)   Pulse 76   Ht 5\' 1"  (1.549 m)   Wt 137 lb (62.1 kg)   SpO2 96%   BMI 25.89 kg/m  , BMI Body mass index is 25.89 kg/m. GEN: Well nourished, well developed, in no acute distress. HEENT: normal. Neck: Supple, no JVD, carotid bruits, or masses. Cardiac: RRR, no murmurs, rubs, or gallops. No clubbing, cyanosis, edema.  Radials/DP/PT 2+ and equal bilaterally.  Respiratory:  Respirations regular and unlabored, clear to auscultation bilaterally. GI: Soft, nontender, nondistended, BS + x 4. MS: no deformity or atrophy. Skin: warm and dry, no rash. Neuro:  Strength and sensation  are intact. Psych: Normal affect.  Accessory Clinical Findings    Recent Labs: 08/02/2022: Magnesium 1.9 10/08/2022: TSH 1.06 02/15/2023: ALT 14 03/30/2023: BUN 12; Creatinine, Ser 1.28; Hemoglobin 12.6; Platelets 255; Potassium 4.4; Sodium 135   Recent Lipid Panel    Component Value Date/Time   CHOL 128 08/04/2022 0957   TRIG 99 08/04/2022 0957   HDL 45 08/04/2022 0957   CHOLHDL 2.8 08/04/2022 0957   LDLCALC 64 08/04/2022 0957         ECG personally reviewed by me today-none today.  Device interrogation 03/27/2023  Battery status okay device function normal. No new symptom, tachy, brady, or pause episodes. 2 new AF episodes, longest 32 min, 1 EGM available and c/w ST with PAC ectopy, cannot exclude AF; hx of AF and on Eliquis per Epic.          Assessment & Plan   1.  Hypertension-contacted nurse triage line 04/04/2023 with concerns about low blood pressure.  Has lost about 85 pounds in the course of a year.  Blood pressure today 120/70. Heart healthy low-sodium diet Maintain blood pressure log Continue diltiazem Order echocardiogram  Persistent atrial fibrillation-heart rate today 76 bpm.  Echocardiogram 1/24 showed LVEF 50%, and normal RV systolic function.  She was diagnosed with atrial fibrillation 2/19.  She did undergo 2 prior A-fib ablations in [place].  She had loop recorder implanted 11/06/2021 for atrial fibrillation surveillance.  She  was initiated on Multaq 4/24. Avoid triggers caffeine, chocolate, EtOH, dehydration etc. Decrease diltiazem to 240 mg daily Continue diltiazem 30 mg short acting tablet every 4 hours as needed for heart rate greater than 100.  Precordial pain-no chest pain today.  Coronary CTA 9/22 showed a coronary calcium score placing her in the 67 percentile and CAD RADS 1 with nonobstructive CAD and no PFO.  Did have an episode of chest discomfort after leaving the emergency department.  She took 2 sublingual nitroglycerin.  Has not had any  further episodes of chest discomfort. No plans for ischemic evaluation   Disposition: Follow-up with Dr. Jens Som or me in 3-4 months.   Sandra Ripple. Kaylinn Dedic NP-C     04/06/2023, 9:55 AM [facility] [address] Office [phone] Fax [phone]    I spent 14 minutes examining this patient, reviewing medications, and using patient centered shared decision making involving their cardiac care.   I spent  20 minutes reviewing past medical history,  medications, and prior cardiac tests.

## 2023-04-04 NOTE — Telephone Encounter (Signed)
 Pt c/o BP issue: STAT if pt c/o blurred vision, one-sided weakness or slurred speech.  STAT if BP is GREATER than 180/120 TODAY.  STAT if BP is LESS than 90/60 and SYMPTOMATIC TODAY  1. What is your BP concern?   Patient is concerned that her BP has been trending low  2. Have you taken any BP medication today?  Yes, but did not take any last night  3. What are your last 5 BP readings?  128/33 (Wed, 3/26)  Patient stated she has not taken it since  4. Are you having any other symptoms (ex. Dizziness, headache, blurred vision, passed out)?   Dizziness  Patient is concerned that her BP has been trending low.  Patient noted she has lost 85 lbs in a year and my need her medication changed.  Patient has appointment scheduled on 4/2 with J. Molli Hazard

## 2023-04-04 NOTE — Telephone Encounter (Signed)
 She ended up going to the ER 03/30/23 due to severe weakness- felt like she was going to pass out. Her BP was 60's/30's before going to the ER  Last BP she checked was probably on Friday? She does not check it often; she just does not go out of the house much because she is scared that her BP will go down and she will get weak  Pt reports that she was feeling weak yesterday and today- 1-2 hours after taking diltiazem and Multaq she starts feeling weak.   Her BP monitor is not working right now- she is unable to accurately get a BP reading. She needs some batteries.   Looking at her medication bottles- she is taking 300 mg of diltiazem AND 240mg  of Diltiazem Every morning= THEREFORE, SHE HAS BEEN TAKING 540 MG OF DILTIAZEM EVERY MORNING. (Then feels weak after)  Instructed her to get rid of her EXTRA medication bottles of Diltiazem- she has 180 mg and 240 mg. She said she is getting rid of them.  Instructed her to (get her bp monitor working asap) check her BP daily 1-2 hours after taking her morning meds and also check it if/when she starts feeling weak. Told her to keep a log of it and bring to her appointment. Call us with any concerns.  She verbalized understanding.

## 2023-04-06 ENCOUNTER — Ambulatory Visit: Attending: General Practice | Admitting: General Practice

## 2023-04-06 ENCOUNTER — Encounter: Payer: Self-pay | Admitting: General Practice

## 2023-04-06 VITALS — BP 120/70 | HR 76 | Ht 61.0 in | Wt 137.0 lb

## 2023-04-06 DIAGNOSIS — R072 Precordial pain: Secondary | ICD-10-CM | POA: Diagnosis not present

## 2023-04-06 DIAGNOSIS — I10 Essential (primary) hypertension: Secondary | ICD-10-CM | POA: Diagnosis not present

## 2023-04-06 DIAGNOSIS — I4819 Other persistent atrial fibrillation: Secondary | ICD-10-CM

## 2023-04-06 NOTE — Patient Instructions (Signed)
 Medication Instructions:  The current medical regimen is effective;  continue present plan and medications as directed. Please refer to the Current Medication list given to you today.  *If you need a refill on your cardiac medications before your next appointment, please call your pharmacy*  Lab Work: NONE If you have labs (blood work) drawn today and your tests are completely normal, you will receive your results only by:  MyChart Message (if you have MyChart) OR A paper copy in the mail If you have any lab test that is abnormal or we need to change your treatment, we will call you to review the results.  Testing/Procedures: Your physician has requested that you have an echocardiogram. Echocardiography is a painless test that uses sound waves to create images of your heart. It provides your doctor with information about the size and shape of your heart and how well your heart's chambers and valves are working. This procedure takes approximately one hour. There are no restrictions for this procedure. Please do NOT wear cologne, perfume, aftershave, or lotions (deodorant is allowed). Please arrive 15 minutes prior to your appointment time.  Please note: We ask at that you not bring children with you during ultrasound (echo/ vascular) testing. Due to room size and safety concerns, children are not allowed in the ultrasound rooms during exams. Our front office staff cannot provide observation of children in our lobby area while testing is being conducted. An adult accompanying a patient to their appointment will only be allowed in the ultrasound room at the discretion of the ultrasound technician under special circumstances. We apologize for any inconvenience.   Follow-Up: At [facility], you and your health needs are our priority.  As part of our continuing mission to provide you with exceptional heart care, our providers are all part of one team.  This team includes your primary  Cardiologist (physician) and Advanced Practice Providers or APPs (Physician Assistants and Nurse Practitioners) who all work together to provide you with the care you need, when you need it.  Your next appointment:   3-4 month(s)  Provider:   Olga Millers, MD  or Edd Fabian, FNP        Other Instructions MAINTAIN HYDRATION EAT MORE FREQUENT SMALL MEALS      1st Floor: - Lobby - Registration  - Pharmacy  - Lab - Cafe  2nd Floor: - PV Lab - Diagnostic Testing (echo, CT, nuclear med)  3rd Floor: - Vacant  4th Floor: - TCTS (cardiothoracic surgery) - AFib Clinic - Structural Heart Clinic - Vascular Surgery  - Vascular Ultrasound  5th Floor: - HeartCare Cardiology (general and EP) - Clinical Pharmacy for coumadin, hypertension, lipid, weight-loss medications, and med management appointments    Valet parking services will be available as well.

## 2023-04-07 ENCOUNTER — Encounter: Payer: Self-pay | Admitting: Cardiovascular Disease

## 2023-04-09 ENCOUNTER — Other Ambulatory Visit: Payer: Self-pay | Admitting: Medical

## 2023-04-26 ENCOUNTER — Ambulatory Visit (HOSPITAL_BASED_OUTPATIENT_CLINIC_OR_DEPARTMENT_OTHER)
Admission: RE | Admit: 2023-04-26 | Discharge: 2023-04-26 | Disposition: A | Discharge status: Home / Self Care | Source: Ambulatory Visit | Attending: General Practice | Admitting: General Practice

## 2023-04-26 DIAGNOSIS — I4819 Other persistent atrial fibrillation: Secondary | ICD-10-CM | POA: Diagnosis not present

## 2023-04-26 LAB — ECHOCARDIOGRAM COMPLETE
AR max vel: 1.24 cm2
AV Area VTI: 1.25 cm2
AV Area mean vel: 1.28 cm2
AV Mean grad: 6 mmHg
AV Peak grad: 10.4 mmHg
AV Vena cont: 0.1 cm
Ao pk vel: 1.61 m/s
Area-P 1/2: 3.53 cm2
Calc EF: 66.4 %
P 1/2 time: 1668 ms
S' Lateral: 2.4 cm
Single Plane A2C EF: 62 %
Single Plane A4C EF: 69.6 %

## 2023-05-02 ENCOUNTER — Ambulatory Visit (INDEPENDENT_AMBULATORY_CARE_PROVIDER_SITE_OTHER): Payer: Medicare Other

## 2023-05-02 DIAGNOSIS — I4819 Other persistent atrial fibrillation: Secondary | ICD-10-CM

## 2023-05-02 LAB — CUP PACEART REMOTE DEVICE CHECK
Date Time Interrogation Session: 20250427231710
Implantable Pulse Generator Implant Date: 20231103

## 2023-05-04 ENCOUNTER — Other Ambulatory Visit: Payer: Self-pay | Admitting: Medical

## 2023-05-04 ENCOUNTER — Ambulatory Visit: Payer: Medicare Other | Admitting: Medical

## 2023-05-04 VITALS — BP 110/70 | HR 83 | Wt 135.2 lb

## 2023-05-04 DIAGNOSIS — E039 Hypothyroidism, unspecified: Secondary | ICD-10-CM | POA: Diagnosis not present

## 2023-05-04 DIAGNOSIS — Z599 Problem related to housing and economic circumstances, unspecified: Secondary | ICD-10-CM | POA: Diagnosis not present

## 2023-05-04 DIAGNOSIS — Z79899 Other long term (current) drug therapy: Secondary | ICD-10-CM

## 2023-05-04 DIAGNOSIS — D649 Anemia, unspecified: Secondary | ICD-10-CM | POA: Diagnosis not present

## 2023-05-04 DIAGNOSIS — R413 Other amnesia: Secondary | ICD-10-CM | POA: Diagnosis not present

## 2023-05-04 DIAGNOSIS — F32A Depression, unspecified: Secondary | ICD-10-CM

## 2023-05-04 DIAGNOSIS — F419 Anxiety disorder, unspecified: Secondary | ICD-10-CM | POA: Diagnosis not present

## 2023-05-04 DIAGNOSIS — E1169 Type 2 diabetes mellitus with other specified complication: Secondary | ICD-10-CM

## 2023-05-04 DIAGNOSIS — L989 Disorder of the skin and subcutaneous tissue, unspecified: Secondary | ICD-10-CM | POA: Diagnosis not present

## 2023-05-04 NOTE — Progress Notes (Signed)
 Subjective:  Sandra Ruiz is a 78 y.o. female who presents for Chief Complaint  Patient presents with   Medical Management of Chronic Issues    6 month follow-up. Would like to have blood work b12 and iron, discuss face spots. Eye exam- fauci     Here for med check.   Lately having some trouble with coming up with words or some cognitive questions.  Recently she says she messed up the checkbook and gotten into debt.  She has been burdened by financial bills particular medical bills in the last year or 2.  She just had another emergency department visit recently and worried about this bill as well.  She wonders about her memory overall.  Has some spots on face she wants looked at.  Been feeling more fatigued of late.  Wants iron and b12 checked.   She went to emergency department due to A-fib issues, medications were adjusted.  She sees cardiology again in June  Compliant with other medicines as usual  No other aggravating or relieving factors.    No other c/o.    Past Medical History:  Diagnosis Date   Chronic pain    Depression    Diabetes mellitus without complication (HCC)    GERD (gastroesophageal reflux disease)    History of TIA (transient ischemic attack)    Hyperlipidemia    Hyperopia of both eyes with astigmatism and presbyopia 12/21/2019   Hypertension    Lyme disease    Paroxysmal atrial fibrillation (HCC)    Sleep apnea    Thyroid  disease    Current Outpatient Medications on File Prior to Visit  Medication Sig Dispense Refill   ALPRAZolam  (XANAX ) 0.25 MG tablet Take 1 tablet (0.25 mg total) by mouth 2 (two) times daily as needed for anxiety. 20 tablet 0   Artificial Tears ophthalmic solution Place 1 drop into both eyes 3 (three) times daily as needed (for dryness).     BENADRYL  ALLERGY 25 MG tablet Take 25 mg by mouth every 6 (six) hours as needed for itching.     diltiazem  (CARDIZEM  CD) 300 MG 24 hr capsule Take 1 capsule (300 mg total) by mouth daily. 90  capsule 2   diltiazem  (CARDIZEM ) 30 MG tablet TAKE 1 TABLET BY MOUTH EVERY 4 HOURS AS NEEDED FOR AFIB HEART RATE >100 AS LONG AS TOP BP >100 90 tablet 1   DULoxetine  (CYMBALTA ) 60 MG capsule TAKE 1 CAPSULE BY MOUTH TWICE  DAILY 200 capsule 0   ELIQUIS  5 MG TABS tablet TAKE 1 TABLET BY MOUTH TWICE  DAILY 200 tablet 2   ezetimibe  (ZETIA ) 10 MG tablet TAKE 1 TABLET BY MOUTH DAILY 100 tablet 0   furosemide  (LASIX ) 20 MG tablet TAKE 1 TABLET BY MOUTH EVERY DAY 90 tablet 1   levothyroxine  (SYNTHROID ) 75 MCG tablet TAKE 1 TABLET BY MOUTH DAILY  BEFORE BREAKFAST 90 tablet 3   MULTAQ  400 MG tablet TAKE 1 TABLET BY MOUTH TWICE  DAILY WITH A MEAL 200 tablet 2   omeprazole  (PRILOSEC) 40 MG capsule Take 40 mg by mouth daily.     oxybutynin  (DITROPAN -XL) 10 MG 24 hr tablet TAKE 1 TABLET BY MOUTH TWICE  DAILY 200 tablet 1   pravastatin  (PRAVACHOL ) 40 MG tablet TAKE 1 TABLET BY MOUTH IN THE  EVENING 100 tablet 1   tirzepatide  (MOUNJARO ) 7.5 MG/0.5ML Pen Inject 7.5 mg into the skin once a week. 6 mL 3   TYLENOL  500 MG tablet Take 500-1,000 mg by  mouth every 6 (six) hours as needed for mild pain or headache.     ACCU-CHEK GUIDE test strip USE 1 TEST STRIP 1 TO 2 TIMES  DAILY 200 strip 2   Accu-Chek Softclix Lancets lancets Use as instructed 90 each 3   Blood Glucose Monitoring Suppl (ACCU-CHEK GUIDE ME) w/Device KIT Test 1-2 times daily 1 kit 0   nitroGLYCERIN  (NITROSTAT ) 0.4 MG SL tablet DISSOLVE 1 TABLET UNDER THE  TONGUE EVERY 5 MINUTES AS NEEDED FOR CHEST PAIN. MAX OF 3 TABLETS IN 15 MINUTES. CALL 911 IF PAIN  PERSISTS. (Patient taking differently: Place 0.4 mg under the tongue every 5 (five) minutes x 3 doses as needed for chest pain (CALL 9-1-1 IF PAIN PERSISTS).) 25 tablet 3   No current facility-administered medications on file prior to visit.     The following portions of the patient's history were reviewed and updated as appropriate: allergies, current medications, past family history, past medical  history, past social history, past surgical history and problem list.  ROS Otherwise as in subjective above  Objective: BP 110/70   Pulse 83   Wt 135 lb 3.2 oz (61.3 kg)   BMI 25.55 kg/m   BP Readings from Last 3 Encounters:  05/04/23 110/70  04/06/23 120/70  03/30/23 123/70   Wt Readings from Last 3 Encounters:  05/04/23 135 lb 3.2 oz (61.3 kg)  04/06/23 137 lb (62.1 kg)  03/30/23 141 lb (64 kg)    General appearance: alert, no distress, well developed, well nourished HEENT: normocephalic, sclerae anicteric Neck: supple, no lymphadenopathy, no thyromegaly, no masses, no bruits Heart: Irregular, RR, normal S1, S2, no murmurs Lungs: CTA bilaterally, no wheezes, rhonchi, or rales Pulses: 2+ radial pulses, 2+ pedal pulses, normal cap refill Ext: no edema  Diabetic Foot Exam - Simple   Simple Foot Form Diabetic Foot exam was performed with the following findings: Yes 05/04/2023  9:25 AM  Visual Inspection See comments: Yes Sensation Testing See comments: Yes Pulse Check See comments: Yes Comments 1+ pedal pulses, hammertoes bilateral second toe, decreased monofilament sensation throughout      Assessment: Encounter Diagnoses  Name Primary?   Type 2 diabetes mellitus with other specified complication, without long-term current use of insulin  (HCC) Yes   Skin lesion    Memory change    Anxiety and depression    Chronic anemia    Financial difficulties    High risk medication use    Hypothyroidism, unspecified type      Plan: Diabetes  Check your blood sugars at least a few days per week fasting Updated lab today Continue Mounjaro  7.5 mg weekly  Skin lesions of cheeks - follow up with dermatology soon.  She sees [facility]  Memory concerns-we discussed her concerns.  MMSE 29 out of 29 today.  Abnormal PHQ-9 Discussed differential, possible contributing factors.  She wants to stop her pravachol  for 3-4 weeks to see if this is contributing to memory  fog.  Consider neuro consult or trial of Aricept.    MRI brain 07/08/20: IMPRESSION: 1. No acute intracranial abnormality. 2. Moderate parenchymal volume loss, more pronounced in the bilateral frontal lobes.   Anxiety and depression-continue home Cymbalta  60 mg twice daily.  We discussed possibly adding an adjunct to her regimen.  Chronic anemia-she requested updated iron and B12 levels today.  Labs as below  Hypothyroidism-continue levothyroxine  75 mcg daily fasting in the morning  I reviewed the recent 03/30/23 Emergency Department visit.  apparently she left AMA.  They felt similar to her symptoms that they including weakness and slurred speech was related to hypoperfusion in hypotension.  Drink around 100 ounces of water daily.   Ishitha was seen today for medical management of chronic issues.  Diagnoses and all orders for this visit:  Type 2 diabetes mellitus with other specified complication, without long-term current use of insulin  (HCC) -     Hemoglobin A1c  Skin lesion  Memory change  Anxiety and depression  Chronic anemia -     Vitamin B12 -     Iron  Financial difficulties  High risk medication use  Hypothyroidism, unspecified type    Follow up: pending labs

## 2023-05-05 LAB — HEMOGLOBIN A1C
Est. average glucose Bld gHb Est-mCnc: 97 mg/dL
Hgb A1c MFr Bld: 5 % (ref 4.8–5.6)

## 2023-05-05 LAB — VITAMIN B12: Vitamin B-12: 719 pg/mL (ref 232–1245)

## 2023-05-05 LAB — IRON: Iron: 80 ug/dL (ref 27–139)

## 2023-05-05 NOTE — Progress Notes (Signed)
 Your B12 is normal, iron normal, hemoglobin A1c 5.0 at goal.  To help with energy and mood, I recommend you do something social regularly such as attending church, begin involved in a community group at church, or a social activity such as weekly friends meet up, board games, bingo or other regularly.  Consider volunteering somewhere a few times per week.  If desired I can add a medication to your Cymbalta  to see if that helps with mood.  Let me know if you want to try this.  Continue rest of medicaiton as usual.

## 2023-05-05 NOTE — Telephone Encounter (Signed)
 Looks like Danielle G discontinued this as she put in 40mg  instead as historical

## 2023-05-09 ENCOUNTER — Ambulatory Visit: Payer: Medicare Other

## 2023-05-16 ENCOUNTER — Encounter: Payer: Self-pay | Admitting: Cardiovascular Disease

## 2023-05-16 NOTE — Progress Notes (Signed)
 Carelink Summary Report / Loop Recorder

## 2023-05-31 NOTE — Progress Notes (Signed)
 Placed referral

## 2023-05-31 NOTE — Addendum Note (Signed)
 Addended by: Charliene Conte A on: 05/31/2023 01:47 PM   Modules accepted: Orders

## 2023-06-02 ENCOUNTER — Ambulatory Visit

## 2023-06-02 ENCOUNTER — Other Ambulatory Visit: Payer: Self-pay | Admitting: Medical

## 2023-06-02 DIAGNOSIS — R55 Syncope and collapse: Secondary | ICD-10-CM | POA: Diagnosis not present

## 2023-06-02 LAB — CUP PACEART REMOTE DEVICE CHECK
Date Time Interrogation Session: 20250528232329
Implantable Pulse Generator Implant Date: 20231103

## 2023-06-06 ENCOUNTER — Ambulatory Visit: Payer: Medicare Other

## 2023-06-08 ENCOUNTER — Ambulatory Visit: Payer: Self-pay | Admitting: Cardiovascular Disease

## 2023-06-10 ENCOUNTER — Telehealth: Payer: Self-pay

## 2023-06-10 NOTE — Progress Notes (Signed)
 Complex Care Management Note Care Guide Note  06/10/2023 Name: Sandra Ruiz MRN: [id number] DOB: April 12, 1945   Complex Care Management Outreach Attempts: An unsuccessful telephone outreach was attempted today to offer the patient information about available complex care management services.  Follow Up Plan:  Additional outreach attempts will be made to offer the patient complex care management information and services.   Encounter Outcome:  No Answer  Creola Doheny [facility], [facility] Guide  Direct Dial: [phone]  Fax [phone]

## 2023-06-10 NOTE — Progress Notes (Signed)
 HPI: FU atrial fibrillation. She has a history of paroxysmal atrial fibrillation and has had previous ablation in [place]. Carotid Dopplers February 2018 showed no significant stenosis. Transesophageal echocardiogram January 2020 in [place] showed normal LV function, mild mitral regurgitation and small secundum ASD.  Had implantable loop placed previously.  Cardiac CTA September 2022 showed calcium score 111 which was 67 percentile and minimal nonobstructive coronary artery disease. ABIs December 2022 moderate bilaterally.  Last cardioversion March 2023.   Echocardiogram April 2025 showed normal LV function, mild aortic stenosis (mean gradient 6 mmHg aortic valve area 1.25 cm).  Read was seen by Dr. Arlester Ladd as she was having recurrent atrial fibrillation.  He recommended changing from Multaq  to dofetilide.  Since last seen she does note dyspnea.  She has occasional chest pain that is not clearly exertional.  It can last 30 minutes at a time.  She has difficulties with recurrent palpitation/atrial fibrillation described as her heart racing.  Current Outpatient Medications  Medication Sig Dispense Refill   ACCU-CHEK GUIDE test strip USE 1 TEST STRIP 1 TO 2 TIMES  DAILY 200 strip 2   Accu-Chek Softclix Lancets lancets Use as instructed 90 each 3   ALPRAZolam  (XANAX ) 0.25 MG tablet Take 1 tablet (0.25 mg total) by mouth 2 (two) times daily as needed for anxiety. 20 tablet 0   Artificial Tears ophthalmic solution Place 1 drop into both eyes 3 (three) times daily as needed (for dryness).     BENADRYL  ALLERGY 25 MG tablet Take 25 mg by mouth every 6 (six) hours as needed for itching.     Blood Glucose Monitoring Suppl (ACCU-CHEK GUIDE ME) w/Device KIT Test 1-2 times daily 1 kit 0   diltiazem  (CARDIZEM ) 30 MG tablet TAKE 1 TABLET BY MOUTH EVERY 4 HOURS AS NEEDED FOR AFIB HEART RATE >100 AS LONG AS TOP BP >100 90 tablet 1   diltiazem  (CARTIA  XT) 300 MG 24 hr capsule TAKE 1 CAPSULE BY MOUTH DAILY 90  capsule 3   DULoxetine  (CYMBALTA ) 60 MG capsule TAKE 1 CAPSULE BY MOUTH TWICE  DAILY 200 capsule 0   ELIQUIS  5 MG TABS tablet TAKE 1 TABLET BY MOUTH TWICE  DAILY 200 tablet 2   ezetimibe  (ZETIA ) 10 MG tablet TAKE 1 TABLET BY MOUTH DAILY 100 tablet 0   furosemide  (LASIX ) 20 MG tablet TAKE 1 TABLET BY MOUTH EVERY DAY 90 tablet 1   levothyroxine  (SYNTHROID ) 75 MCG tablet TAKE 1 TABLET BY MOUTH DAILY  BEFORE BREAKFAST 100 tablet 2   MULTAQ  400 MG tablet TAKE 1 TABLET BY MOUTH TWICE  DAILY WITH A MEAL 200 tablet 2   nitroGLYCERIN  (NITROSTAT ) 0.4 MG SL tablet DISSOLVE 1 TABLET UNDER THE  TONGUE EVERY 5 MINUTES AS NEEDED FOR CHEST PAIN. MAX OF 3 TABLETS IN 15 MINUTES. CALL 911 IF PAIN  PERSISTS. (Patient taking differently: Place 0.4 mg under the tongue every 5 (five) minutes x 3 doses as needed for chest pain (CALL 9-1-1 IF PAIN PERSISTS).) 25 tablet 3   omeprazole  (PRILOSEC) 40 MG capsule Take 40 mg by mouth daily.     oxybutynin  (DITROPAN -XL) 10 MG 24 hr tablet TAKE 1 TABLET BY MOUTH TWICE  DAILY 200 tablet 0   tirzepatide  (MOUNJARO ) 7.5 MG/0.5ML Pen Inject 7.5 mg into the skin once a week. 6 mL 3   TYLENOL  500 MG tablet Take 500-1,000 mg by mouth every 6 (six) hours as needed for mild pain or headache.  pravastatin  (PRAVACHOL ) 40 MG tablet TAKE 1 TABLET BY MOUTH IN THE  EVENING (Patient not taking: Reported on 06/23/2023) 100 tablet 1   No current facility-administered medications for this visit.     Past Medical History:  Diagnosis Date   Chronic pain    Depression    Diabetes mellitus without complication (HCC)    GERD (gastroesophageal reflux disease)    History of TIA (transient ischemic attack)    Hyperlipidemia    Hyperopia of both eyes with astigmatism and presbyopia 12/21/2019   Hypertension    Lyme disease    Paroxysmal atrial fibrillation (HCC)    Sleep apnea    Thyroid  disease     Past Surgical History:  Procedure Laterality Date   ATRIAL FIBRILLATION ABLATION     x2 in  [place]   CARDIAC CATHETERIZATION     CARDIOVERSION N/A 03/05/2021   Procedure: CARDIOVERSION;  Surgeon: Sonny Dust, MD;  Location: [facility] ENDOSCOPY;  Service: Cardiovascular;  Laterality: N/A;   CERVICAL SPINE SURGERY     GIVENS CAPSULE STUDY N/A 07/25/2022   Procedure: GIVENS CAPSULE STUDY;  Surgeon: Renaye Carp, DO;  Location: [facility] ENDOSCOPY;  Service: Gastroenterology;  Laterality: N/A;   implantable loop recorder placement  11/07/2017   MDT LINQ1 implanted in [place] for afib management by Dr Zelda Hickman   LOOP RECORDER INSERTION N/A 11/06/2021   Procedure: LOOP RECORDER INSERTION;  Surgeon: Efraim Grange, MD;  Location: [facility] INVASIVE CV LAB;  Service: Cardiovascular;  Laterality: N/A;   LOOP RECORDER REMOVAL N/A 11/06/2021   Procedure: LOOP RECORDER REMOVAL;  Surgeon: Efraim Grange, MD;  Location: [facility] INVASIVE CV LAB;  Service: Cardiovascular;  Laterality: N/A;   LUMBAR SPINE SURGERY     TONSILLECTOMY AND ADENOIDECTOMY      Social History   Socioeconomic History   Marital status: Married    Spouse name: Not on file   Number of children: 5   Years of education: Not on file   Highest education level: Some college, no degree  Occupational History   Not on file  Tobacco Use   Smoking status: Former   Smokeless tobacco: Never   Tobacco comments:    Former smoker 11/12/2020  Vaping Use   Vaping status: Never Used  Substance and Sexual Activity   Alcohol  use: Yes    Alcohol /week: 2.0 standard drinks of alcohol     Types: 2 Standard drinks or equivalent per week    Comment: mix drinks occ.   Drug use: Never   Sexual activity: Not on file  Other Topics Concern   Not on file  Social History Narrative   From [place], married, [place], new from [place]  to [place]  07/2019   Right handed   Some caffeine use    Lives with husband, daughter, and son-in-law   Social Drivers of Corporate investment banker Strain: Low Risk  (10/12/2022)   Overall  Financial Resource Strain (CARDIA)    Difficulty of Paying Living Expenses: Not hard at all  Food Insecurity: Food Insecurity Present (01/14/2023)   Hunger Vital Sign    Worried About Running Out of Food in the Last Year: Sometimes true    Ran Out of Food in the Last Year: Sometimes true  Transportation Needs: No Transportation Needs (01/14/2023)   PRAPARE - Administrator, Civil Service (Medical): No    Lack of Transportation (Non-Medical): No  Physical Activity: Sufficiently Active (10/12/2022)   Exercise Vital Sign  Days of Exercise per Week: 2 days    Minutes of Exercise per Session: 120 min  Stress: Stress Concern Present (10/12/2022)   [employer] of Occupational Health - Occupational Stress Questionnaire    Feeling of Stress : To some extent  Social Connections: Moderately Isolated (10/12/2022)   Social Connection and Isolation Panel    Frequency of Communication with Friends and Family: Once a week    Frequency of Social Gatherings with Friends and Family: Never    Attends Religious Services: More than 4 times per year    Active Member of [employer] or Organizations: No    Attends Banker Meetings: Never    Marital Status: Married  Catering manager Violence: Not At Risk (01/14/2023)   Humiliation, Afraid, Rape, and Kick questionnaire    Fear of Current or Ex-Partner: No    Emotionally Abused: No    Physically Abused: No    Sexually Abused: No    Family History  Problem Relation Age of Onset   CAD Mother    Thyroid  disease Mother    CAD Father    Thyroid  disease Daughter    Hypothyroidism Daughter     ROS: no fevers or chills, productive cough, hemoptysis, dysphasia, odynophagia, melena, hematochezia, dysuria, hematuria, rash, seizure activity, orthopnea, PND, pedal edema, claudication. Remaining systems are negative.  Physical Exam: Well-developed well-nourished in no acute distress.  Skin is warm and dry.  HEENT is normal.  Neck is supple.   Chest is clear to auscultation with normal expansion.  Cardiovascular exam is regular rate and rhythm.  Abdominal exam nontender or distended. No masses palpated. Extremities show no edema. neuro grossly intact  ECG-June 16, 2023-sinus rhythm with PACs.  Personally reviewed  EKG Interpretation Date/Time:  Thursday June 23 2023 07:54:13 EDT Ventricular Rate:  83 PR Interval:  174 QRS Duration:  86 QT Interval:  380 QTC Calculation: 446 R Axis:   46  Text Interpretation: Sinus rhythm with Premature supraventricular complexes Confirmed by Alexandria Angel ([id number]) on 06/23/2023 7:54:58 AM    A/P  1 paroxysmal atrial fibrillation-continue Cardizem  for rate control of atrial fibrillation recurrence.  Continue Multaq  and apixaban .  She is having recurrent episodes of atrial fibrillation documented on her loop recorder.  She is symptomatic.  She saw Dr. Arlester Ladd and changing from Multaq  to Tikosyn was recommended.  However she was hesitant due to potential risks of Tikosyn.  She would prefer to be seen at [facility] for a second opinion and she is in the process of arranging this.  2 hypertension-blood pressure controlled.  Continue present medical regimen.  3 hyperlipidemia-continue pravastatin  and Zetia .  She did not tolerate either Lipitor or Crestor previously.  4 nonobstructive coronary artery disease-continue statin.  5 chronic diastolic congestive heart failure-she will take Lasix  as needed.  She is euvolemic today.  6 history of small secundum ASD-no RV dilatation noted on most recent echocardiogram.  Will follow.  7 obstructive sleep apnea-patient uses an oral device for this at home.  8 prior chest pain-previous CTA showed minimal nonobstructive coronary disease; electrocardiogram shows no new ST changes.  Will follow for now.  9 history of syncope-implantable loop monitor in place.  She does have a history of orthostasis which was exacerbated by weight loss.  Alexandria Angel,  MD

## 2023-06-13 ENCOUNTER — Ambulatory Visit: Payer: Medicare Other

## 2023-06-13 NOTE — Progress Notes (Unsigned)
 Complex Care Management Note Care Guide Note  06/13/2023 Name: Sandra Ruiz MRN: [id number] DOB: 02-24-1945   Complex Care Management Outreach Attempts: A second unsuccessful outreach was attempted today to offer the patient with information about available complex care management services.  Follow Up Plan:  Additional outreach attempts will be made to offer the patient complex care management information and services.   Encounter Outcome:  No Answer   Creola Doheny [facility], [facility] Guide  Direct Dial: [phone]  Fax [phone]

## 2023-06-14 NOTE — Progress Notes (Signed)
 Complex Care Management Note  Care Guide Note 06/14/2023 Name: Sandra Ruiz MRN: [id number] DOB: 1945/04/19  [name] is a 78 y.o. year old female who sees Tysinger, Christiane Cowing, PA-C for primary care. I reached out to Verdell Given by phone today to offer complex care management services.  [name] was given information about Complex Care Management services today including:   The Complex Care Management services include support from the care team which includes your Nurse Care Manager, Clinical Social Worker, or Pharmacist.  The Complex Care Management team is here to help remove barriers to the health concerns and goals most important to you. Complex Care Management services are voluntary, and the patient may decline or stop services at any time by request to their care team member.   Complex Care Management Consent Status: Patient agreed to services and verbal consent obtained.   Follow up plan:  Telephone appointment with complex care management team member scheduled for:  07-14-23 and 07-21-23  Encounter Outcome:  Patient Scheduled  Creola Doheny [facility], [facility] Guide  Direct Dial: [phone]  Fax [phone]

## 2023-06-15 ENCOUNTER — Other Ambulatory Visit: Payer: Self-pay | Admitting: Medical

## 2023-06-15 ENCOUNTER — Other Ambulatory Visit (HOSPITAL_COMMUNITY): Payer: Self-pay | Admitting: Physician Assistant

## 2023-06-16 ENCOUNTER — Encounter: Payer: Self-pay | Admitting: Cardiovascular Disease

## 2023-06-16 ENCOUNTER — Ambulatory Visit: Attending: Cardiovascular Disease | Admitting: Cardiovascular Disease

## 2023-06-16 VITALS — BP 150/80 | HR 91 | Ht 61.0 in | Wt 129.8 lb

## 2023-06-16 DIAGNOSIS — I4819 Other persistent atrial fibrillation: Secondary | ICD-10-CM | POA: Diagnosis not present

## 2023-06-16 NOTE — Progress Notes (Signed)
  Cardiology Office Note:    Date:  06/16/2023   ID:  Sandra Ruiz, DOB [birth year], MRN [id number]  PCP:  Garner Jury   [facility] Providers Cardiologist:  None     Referring MD: Claudene Crystal, PA-C   Chief complaint: palpitations, lightheadedness  History of Present Illness:    Sandra Ruiz is a 78 y.o. female with a hx of sleep apnea hypertension, diabetes, obesity atrial fibrillation and has undergone ablation twice in [place] .   She was diagnosed with atrial fibrillation in 2019.  She had recurrence and had cardioversion was scheduled for February 2022, but she spontaneously converted back to sinus rhythm.  She has an implantable loop recorder that detected an episode in January 2023.  This episode was associated with fatigue and shortness of breath.  She underwent cardioversion in March 2023.     EKGs/Labs/Other Studies Reviewed:     CT Coronary 09/22/2020 1. 3 vessel coronary calcium which is 67 th percentile for age/sex 2.  Normal aortic root 3.4 cm 3.  CAD RADS 1 non obstructive CAD see description above 4.  No PFO noted  TTE 06/21/2019 EF 65-70%. Grade I diastolic dysfunction. Normal LA size  EKG:  Last EKG results: Today: sinus rhythm with frequent PACs   Recent Labs: 08/02/2022: Magnesium  1.9 10/08/2022: TSH 1.06 02/15/2023: ALT 14 03/30/2023: BUN 12; Creatinine, Ser 1.28; Hemoglobin 12.6; Platelets 255; Potassium 4.4; Sodium 135      Physical Exam:    VS:  BP (!) 150/80   Pulse 91   Ht 5' 1 (1.549 m)   Wt 129 lb 12.8 oz (58.9 kg)   SpO2 98%   BMI 24.53 kg/m     Wt Readings from Last 3 Encounters:  06/16/23 129 lb 12.8 oz (58.9 kg)  05/04/23 135 lb 3.2 oz (61.3 kg)  04/06/23 137 lb (62.1 kg)     GEN:  Well nourished, well developed in no acute distress CARDIAC: RRR, no murmurs, rubs, gallops RESPIRATORY:  Normal work of breathing MUSCULOSKELETAL: no edema    ASSESSMENT & PLAN:    Persistent atrial  fibrillation: S/p ablation x2. Monitoring with loop recorder. Recently had a 76% burden of A-fib; this month approximately 4% Rates are not always optimally controlled We discussed switching from Multaq  to dofetilide.  She would like to think about it.  She would likely have to make some medication changes and will need to discontinue taking Benadryl .  Secondary hypercoagulable state: CHADS2Vasc is 3 for sex, age, and probable CAD. Continue eliquis  5mg  PO BID  Obstructive sleep apnea  Obesity She has lost a significant amount of weight  Coronary artery disease  CHFrEF          Medication Adjustments/Labs and Tests Ordered: Current medicines are reviewed at length with the patient today.  Concerns regarding medicines are outlined above.  Orders Placed This Encounter  Procedures   EKG 12-Lead   No orders of the defined types were placed in this encounter.    Signed, Efraim Grange, MD  06/16/2023 1:41 PM    [facility]

## 2023-06-16 NOTE — Patient Instructions (Signed)
 Medication Instructions:  Dr Arlester Ladd recommends a Tikosyn load (with stopping Multaq  at that time), this does require hospitalization for start - please reach out to our office if you decide you would like to move forward with this medication change *If you need a refill on your cardiac medications before your next appointment, please call your pharmacy*  Follow-Up: At [facility], you and your health needs are our priority.  As part of our continuing mission to provide you with exceptional heart care, our providers are all part of one team.  This team includes your primary Cardiologist (physician) and Advanced Practice Providers or APPs (Physician Assistants and Nurse Practitioners) who all work together to provide you with the care you need, when you need it.  Your next appointment:   6 month(s)  Provider:   You will see one of the following Advanced Practice Providers on your designated Care Team:   Mertha Abrahams, PA-C Michael Andy Tillery, PA-C Suzann Riddle, NP Creighton Doffing, NP

## 2023-06-22 NOTE — Progress Notes (Signed)
 Carelink Summary Report / Loop Recorder

## 2023-06-23 ENCOUNTER — Ambulatory Visit: Attending: Cardiology | Admitting: Cardiology

## 2023-06-23 ENCOUNTER — Encounter: Payer: Self-pay | Admitting: Cardiology

## 2023-06-23 VITALS — BP 128/76 | HR 79 | Ht 61.0 in | Wt 129.6 lb

## 2023-06-23 DIAGNOSIS — E7800 Pure hypercholesterolemia, unspecified: Secondary | ICD-10-CM | POA: Diagnosis not present

## 2023-06-23 DIAGNOSIS — I10 Essential (primary) hypertension: Secondary | ICD-10-CM

## 2023-06-23 DIAGNOSIS — I2510 Atherosclerotic heart disease of native coronary artery without angina pectoris: Secondary | ICD-10-CM | POA: Diagnosis not present

## 2023-06-23 DIAGNOSIS — R072 Precordial pain: Secondary | ICD-10-CM | POA: Diagnosis not present

## 2023-06-23 DIAGNOSIS — R079 Chest pain, unspecified: Secondary | ICD-10-CM | POA: Diagnosis not present

## 2023-06-23 DIAGNOSIS — I480 Paroxysmal atrial fibrillation: Secondary | ICD-10-CM

## 2023-06-23 NOTE — Patient Instructions (Signed)
 Medication Instructions:  No changes *If you need a refill on your cardiac medications before your next appointment, please call your pharmacy*  Lab Work: None ordered If you have labs (blood work) drawn today and your tests are completely normal, you will receive your results only by: MyChart Message (if you have MyChart) OR A paper copy in the mail If you have any lab test that is abnormal or we need to change your treatment, we will call you to review the results.  Testing/Procedures: None ordered  Follow-Up: At [facility], you and your health needs are our priority.  As part of our continuing mission to provide you with exceptional heart care, our providers are all part of one team.  This team includes your primary Cardiologist (physician) and Advanced Practice Providers or APPs (Physician Assistants and Nurse Practitioners) who all work together to provide you with the care you need, when you need it.  Your next appointment:   4-5 month(s)  Provider:   Dr Audery Blazing  We recommend signing up for the patient portal called MyChart.  Sign up information is provided on this After Visit Summary.  MyChart is used to connect with patients for Virtual Visits (Telemedicine).  Patients are able to view lab/test results, encounter notes, upcoming appointments, etc.  Non-urgent messages can be sent to your provider as well.   To learn more about what you can do with MyChart, go to [email or web address].

## 2023-06-28 DIAGNOSIS — M48062 Spinal stenosis, lumbar region with neurogenic claudication: Secondary | ICD-10-CM | POA: Diagnosis not present

## 2023-06-30 ENCOUNTER — Ambulatory Visit: Payer: Self-pay

## 2023-06-30 ENCOUNTER — Other Ambulatory Visit

## 2023-06-30 NOTE — Telephone Encounter (Signed)
 FYI Only or Action Required?: FYI only for provider.  Patient was last seen in primary care on 05/04/2023 by Bulah Alm RAMAN, PA-C. Called Nurse Triage reporting Depression. Symptoms began about a month ago. Interventions attempted: Prescription medications: depression medications. Symptoms are: stable.  Triage Disposition: See Physician Within 24 Hours  Patient/caregiver understands and will follow disposition?: No  Pt had fraud attack back in May and still affecting her depression. Pt states she is still taking medications but has had so many recent changes she is afraid of trying new meds. Had appt with Pharmacy today but missed call and offered to get her back in touch but pt prefers to wait. She isn't interested in changing meds until she goes to [facility] for 2nd opinion on a-fib med. Offered to schedule appt for depression with PCP but pt prefers to wait as well. Will call back if she feels she needs appt.   Copied from CRM [id number]. Topic: Clinical - Red Word Triage >> Jun 30, 2023 11:07 AM Graeme ORN wrote: Red Word that prompted transfer to Nurse Triage: Deep Depression    ----------------------------------------------------------------------- From previous Reason for Contact - Appt Info/Confirm: Patient/patient representative is calling for information regarding an appointment. Reason for Disposition  Symptoms interfere with work or school  Answer Assessment - Initial Assessment Questions 1. CONCERN: What happened that made you call today?     Had appt for pharmacy but missed each other  2. DEPRESSION SYMPTOM SCREENING: How are you feeling overall? (e.g., decreased energy, increased sleeping or difficulty sleeping, difficulty concentrating, feelings of sadness, guilt, hopelessness, or worthlessness)     Increased depression, decreased energy, sadness and tearful  3. RISK OF HARM - SUICIDAL IDEATION:  Do you ever have thoughts of hurting or killing yourself?  (e.g., yes, no, no  but preoccupation with thoughts about death)   - INTENT:  Do you have thoughts of hurting or killing yourself right NOW? (e.g., yes, no, N/A)   - PLAN: Do you have a specific plan for how you would do this? (e.g., gun, knife, overdose, no plan, N/A)     No 5. FUNCTIONAL IMPAIRMENT: How have things been going for you overall? Have you had more difficulty than usual doing your normal daily activities?  (e.g., better, same, worse; self-care, school, work, interactions)     Affecting daily life  6. SUPPORT: Who is with you now? Who do you live with? Do you have family or friends who you can talk to?      Daughter and other family  8. STRESSORS: Has there been any new stress or recent changes in your life?     Recent fraud in May  Still taking prescribed medications  Protocols used: Depression-A-AH

## 2023-06-30 NOTE — Progress Notes (Deleted)
   06/30/2023  Patient ID: Sandra Ruiz, female   DOB: 1945/05/17, 78 y.o.   MRN: [id number]  Attempted to contact patient for scheduled appointment for medication management. Left HIPAA compliant message for patient to return my call at their convenience.   [name], PharmD Clinical Pharmacist [phone]

## 2023-07-01 NOTE — Telephone Encounter (Signed)
 Left message for pt to call and schedule appointment

## 2023-07-02 ENCOUNTER — Encounter: Payer: Self-pay | Admitting: Cardiovascular Disease

## 2023-07-04 ENCOUNTER — Ambulatory Visit

## 2023-07-04 DIAGNOSIS — I4819 Other persistent atrial fibrillation: Secondary | ICD-10-CM | POA: Diagnosis not present

## 2023-07-04 LAB — CUP PACEART REMOTE DEVICE CHECK
Date Time Interrogation Session: 20250629233927
Implantable Pulse Generator Implant Date: 20231103

## 2023-07-10 ENCOUNTER — Ambulatory Visit: Payer: Self-pay | Admitting: Cardiovascular Disease

## 2023-07-11 ENCOUNTER — Ambulatory Visit: Payer: Medicare Other

## 2023-07-14 ENCOUNTER — Other Ambulatory Visit

## 2023-07-14 DIAGNOSIS — Z79899 Other long term (current) drug therapy: Secondary | ICD-10-CM

## 2023-07-14 NOTE — Progress Notes (Signed)
 07/14/2023 Name: Sandra Ruiz MRN: [id number] DOB: May 25, 1945  Chief Complaint  Patient presents with   Medication Management    Sandra Ruiz is a 78 y.o. year old female who presented for a telephone visit.   They were referred to the pharmacist by their PCP for assistance in managing complex medication management.    Subjective:  Care Team: Primary Care Provider: Bulah Alm GORMAN DEVONNA ; Next Scheduled Visit: 11/09/23  Medication Access/Adherence  Current Pharmacy:  [facility] [phone] - [place], [place] - [address] [place] [address] Phone: [phone] Fax: [phone]  [facility] [place], [place] - [address] [place] [address] Phone: [phone] Fax: [phone]   Patient reports affordability concerns with their medications: No  Patient reports access/transportation concerns to their pharmacy: No  Patient reports adherence concerns with their medications:  No     Diabetes:  Current medications: Mounjaro  7.5mg  once weekly on Wednesdays Medications tried in the past: Glyburide , Metformin , Janumet , Ozempic   Current glucose readings:  Well-controlled readings   Hyperlipidemia/ASCVD Risk Reduction  Current lipid lowering medications: Zetia  10mg  Medications tried in the past: Pravastatin  40mg   Stopped pravastatin , felt it was no longer needed with well-controlled LDL  Atrial Fibrillation:  Current medications:Eliquis , Multaq   Medications tried in the past: Metoprolol   Having many a fib episodes, has been recommended she start tikosyn. Has an appt scheduled with [facility] at end of this month for second opinion  Denies any signs of bleed with at risk medications  Depression/Anxiety :  Current medications: Xanax  0.25mg  prn, Cymbalta  60mg  BID (also being used for nerve pain) Medications tried in the past: None  Behavioral Health support:  None    Objective:  Lab Results  Component Value Date   HGBA1C 5.0 05/04/2023    Lab Results  Component Value Date   CREATININE 1.28 (H) 03/30/2023   BUN 12 03/30/2023   NA 135 03/30/2023   K 4.4 03/30/2023   CL 100 03/30/2023   CO2 26 03/30/2023    Lab Results  Component Value Date   CHOL 128 08/04/2022   HDL 45 08/04/2022   LDLCALC 64 08/04/2022   TRIG 99 08/04/2022   CHOLHDL 2.8 08/04/2022    Medications Reviewed Today     Reviewed by Lionell Jon DEL, RPH (Pharmacist) on 07/14/23 at 1130  Med List Status: <None>   Medication Order Taking? Sig Documenting Provider Last Dose Status Informant  ACCU-CHEK GUIDE test strip [id number]  USE 1 TEST STRIP 1 TO 2 TIMES  DAILY Tysinger, Alm GORMAN, PA-C  Active   Accu-Chek Softclix Lancets lancets [id number]  Use as instructed Bulah Alm GORMAN, PA-C  Active   ALPRAZolam  (XANAX ) 0.25 MG tablet [id number] Yes Take 1 tablet (0.25 mg total) by mouth 2 (two) times daily as needed for anxiety. Bulah Alm GORMAN, PA-C  Active   Artificial Tears ophthalmic solution [id number] Yes Place 1 drop into both eyes 3 (three) times daily as needed (for dryness). [provider]  Active Self  BENADRYL  ALLERGY 25 MG tablet [id number] Yes Take 25 mg by mouth every 6 (six) hours as needed for itching. [provider]  Active Self  Blood Glucose Monitoring Suppl (ACCU-CHEK GUIDE ME) w/Device KIT [id number]  Test 1-2 times daily Tysinger, David S, PA-C  Active   diltiazem  (CARDIZEM ) 30 MG tablet [id number] Yes TAKE 1 TABLET BY MOUTH EVERY 4 HOURS AS NEEDED FOR AFIB HEART  RATE >100 AS LONG AS TOP BP >100 Fenton, Clint R, PA  Active Self  diltiazem  (CARTIA  XT) 300 MG 24 hr capsule [id number] Yes TAKE 1 CAPSULE BY MOUTH DAILY Mealor, Augustus E, MD  Active   DULoxetine  (CYMBALTA ) 60 MG capsule [id number] Yes TAKE 1 CAPSULE BY MOUTH TWICE  DAILY Tysinger, David S, PA-C  Active   ELIQUIS  5 MG TABS tablet [id number] Yes TAKE 1 TABLET BY MOUTH TWICE   DAILY Tysinger, Alm RAMAN, PA-C  Active   ezetimibe  (ZETIA ) 10 MG tablet [id number] Yes TAKE 1 TABLET BY MOUTH DAILY Tysinger, Alm RAMAN, PA-C  Active   furosemide  (LASIX ) 20 MG tablet [id number] Yes TAKE 1 TABLET BY MOUTH EVERY DAY Bulah Alm RAMAN, PA-C  Active   levothyroxine  (SYNTHROID ) 75 MCG tablet [id number] Yes TAKE 1 TABLET BY MOUTH DAILY  BEFORE BREAKFAST Tysinger, David S, PA-C  Active   MULTAQ  400 MG tablet [id number] Yes TAKE 1 TABLET BY MOUTH TWICE  DAILY WITH A MEAL Crenshaw, Redell RAMAN, MD  Active   nitroGLYCERIN  (NITROSTAT ) 0.4 MG SL tablet [id number] Yes DISSOLVE 1 TABLET UNDER THE  TONGUE EVERY 5 MINUTES AS NEEDED FOR CHEST PAIN. MAX OF 3 TABLETS IN 15 MINUTES. CALL 911 IF PAIN  PERSISTS. Pietro Redell RAMAN, MD  Active Self  omeprazole  (PRILOSEC) 40 MG capsule [id number] Yes Take 40 mg by mouth daily. [provider]  Active   oxybutynin  (DITROPAN -XL) 10 MG 24 hr tablet [id number] Yes TAKE 1 TABLET BY MOUTH TWICE  DAILY Bulah Alm RAMAN, PA-C  Active   pravastatin  (PRAVACHOL ) 40 MG tablet [id number]  TAKE 1 TABLET BY MOUTH IN THE  EVENING  Patient not taking: Reported on 07/14/2023   Bulah Alm RAMAN, PA-C  Active   tirzepatide  (MOUNJARO ) 7.5 MG/0.5ML Pen [id number] Yes Inject 7.5 mg into the skin once a week. Tysinger, Alm RAMAN, PA-C  Active   TYLENOL  500 MG tablet [id number] Yes Take 500-1,000 mg by mouth every 6 (six) hours as needed for mild pain or headache. [provider]  Active Self              Assessment/Plan:   Diabetes: - Currently controlled - Reviewed long term cardiovascular and renal outcomes of uncontrolled blood sugar - Reviewed goal A1c, goal fasting, and goal 2 hour post prandial glucose -Continue current medication therapy. Notify if cost concerns arise.  -Due for uACR at next PCP visit      Hyperlipidemia/ASCVD Risk Reduction: - Currently controlled.  - Reviewed long term complications of uncontrolled cholesterol - Recommend to continue  zetia . Obtain updated lipid panel at next PCP visit. If LDL has elevated above goal, consider starting a statin to replace stopped pravastatin        Atrial Fibrillation: - Currently uncontrolled - Reviewed importance of adherence to anticoagulant for stroke prevention. - Reviewed appropriate blood pressure monitoring technique and reviewed goal blood pressure.  - Recommend to keep appt with [facility], continue all medication for now. Will follow up post [facility] appt to see what decisions have been made regarding tikosyn -Monitor for signs of bleed, notify of cost concerns arise.  Depression/Anxiety: - Reviewed behavioral health support strategies including social activities and meeting with behavioral health. Patient declines any behavioral health referral at this time - Consider adjunct therapy to cymbalta , patient declines at this time. Feels mood is in good place at the moment, will reach out sooner than follow up if she changes her mind on this or if starts to feel worse  Follow Up Plan: 08/18/23  [name], PharmD Clinical Pharmacist [phone]

## 2023-07-18 ENCOUNTER — Other Ambulatory Visit: Payer: Self-pay | Admitting: Cardiology

## 2023-07-18 ENCOUNTER — Ambulatory Visit: Payer: Medicare Other

## 2023-07-18 ENCOUNTER — Other Ambulatory Visit: Payer: Self-pay | Admitting: Medical

## 2023-07-18 DIAGNOSIS — E7800 Pure hypercholesterolemia, unspecified: Secondary | ICD-10-CM

## 2023-07-21 ENCOUNTER — Other Ambulatory Visit: Payer: Self-pay

## 2023-07-21 NOTE — Progress Notes (Signed)
 Carelink Summary Report / Loop Recorder

## 2023-07-21 NOTE — Patient Outreach (Signed)
 Complex Care Management   Visit Note  07/21/2023  Name:  Sandra Ruiz MRN: [id number] DOB: 1945-12-18  Situation: Referral received for Complex Care Management related to Heart Failure, Atrial Fibrillation, and Chronic low back pain, Chronic neck pain, weight loss, SDOH needs. I obtained verbal consent from Patient.  Visit completed with patient on the phone.  Background:   Past Medical History:  Diagnosis Date   Chronic pain    Depression    Diabetes mellitus without complication (HCC)    GERD (gastroesophageal reflux disease)    History of TIA (transient ischemic attack)    Hyperlipidemia    Hyperopia of both eyes with astigmatism and presbyopia 12/21/2019   Hypertension    Lyme disease    Paroxysmal atrial fibrillation (HCC)    Sleep apnea    Thyroid  disease     Assessment: Patient Reported Symptoms:  Cognitive Cognitive Status: Alert and oriented to person, place, and time, Struggling with memory recall Cognitive/Intellectual Conditions Management [RPT]: None reported or documented in medical history or problem list   Health Maintenance Behaviors: Annual physical exam  Neurological Neurological Review of Symptoms: Numbness Neurological Management Strategies: Routine screening Neurological Self-Management Outcome: 2 (bad) Neurological Comment: Neuropathy in both feet, hands and legs  HEENT HEENT Symptoms Reported: Not assessed      Cardiovascular Cardiovascular Symptoms Reported: Irregular pulse Does patient have uncontrolled Hypertension?: No Cardiovascular Management Strategies: Medication therapy, Medical device, Routine screening Cardiovascular Self-Management Outcome: 3 (uncertain)  Respiratory Respiratory Symptoms Reported: No symptoms reported    Endocrine Endocrine Symptoms Reported: No symptoms reported Is patient diabetic?: No Endocrine Self-Management Outcome: 4 (good)  Gastrointestinal Gastrointestinal Symptoms Reported: Unintentional weight loss       Genitourinary Genitourinary Symptoms Reported: Not assessed    Integumentary Integumentary Symptoms Reported: Not assessed    Musculoskeletal Musculoskelatal Symptoms Reviewed: Back pain, Unsteady gait, Muscle pain, Joint pain Additional Musculoskeletal Details: chronic neck pain, Chronic low back pain Musculoskeletal Management Strategies: Medication therapy, Routine screening Musculoskeletal Self-Management Outcome: 3 (uncertain)      Psychosocial Psychosocial Symptoms Reported: Depression - if selected complete PHQ 2-9 Behavioral Management Strategies: Medication therapy Behavioral Health Self-Management Outcome: 2 (bad) Major Change/Loss/Stressor/Fears (CP): Medical condition, self, Resources Behaviors When Feeling Stressed/Fearful: Physicist, medical strain Techniques to [employer] with Loss/Stress/Change: Medication Quality of Family Relationships: involved, helpful, supportive Do you feel physically threatened by others?: No      05/04/2023    8:42 AM  Depression screen PHQ 2/9  Decreased Interest 2  Down, Depressed, Hopeless 2  PHQ - 2 Score 4  Altered sleeping 3  Tired, decreased energy 3  Change in appetite 0  Feeling bad or failure about yourself  3  Trouble concentrating 3  Moving slowly or fidgety/restless 3  Suicidal thoughts 0  PHQ-9 Score 19  Difficult doing work/chores Very difficult    There were no vitals filed for this visit.  Medications Reviewed Today   Medications were not reviewed in this encounter     Recommendation:   PCP Follow-up Referral to: local food pantry via FindHelp   Follow Up Plan:   Telephone follow up appointment date/time:  Monday, July 21 at 12:15 PM Referral to Licensed Clinical Social Worker  Clayborne Isabele Lollar RN BSN CCM [employer] [facility]  [facility], [facility] Nurse Care Coordinator  Direct Dial: [phone] Website: [email or web address]

## 2023-07-25 ENCOUNTER — Other Ambulatory Visit: Payer: Self-pay

## 2023-07-25 DIAGNOSIS — I4891 Unspecified atrial fibrillation: Secondary | ICD-10-CM

## 2023-07-25 DIAGNOSIS — I5032 Chronic diastolic (congestive) heart failure: Secondary | ICD-10-CM

## 2023-07-25 NOTE — Patient Instructions (Signed)
 Visit Information  Thank you for taking time to visit with me today. Please don't hesitate to contact me if I can be of assistance to you before our next scheduled appointment.  Your next care management appointment is by telephone on August 19 at 10:30 AM  Please call the care guide team at [phone] if you need to cancel, schedule, or reschedule an appointment.   Please call 1-800-273-TALK (toll free, 24 hour hotline) if you are experiencing a Mental Health or Behavioral Health Crisis or need someone to talk to.  Clayborne Ly RN BSN CCM [place]  [facility], [facility] Health Nurse Care Coordinator  Direct Dial: [phone] Website: [email or web address]

## 2023-07-25 NOTE — Patient Outreach (Signed)
 Complex Care Management   Visit Note  07/25/2023  Name:  Sandra Ruiz MRN: [id number] DOB: 1945/01/31  Situation: Referral received for Complex Care Management related to Heart Failure, Atrial Fibrillation, and Chronic low back pain, Chronic neck pain, weight loss, SDOH needs. I obtained verbal consent from Patient.  Visit completed with patient on the phone.  Background:   Past Medical History:  Diagnosis Date   Chronic pain    Depression    Diabetes mellitus without complication (HCC)    GERD (gastroesophageal reflux disease)    History of TIA (transient ischemic attack)    Hyperlipidemia    Hyperopia of both eyes with astigmatism and presbyopia 12/21/2019   Hypertension    Lyme disease    Paroxysmal atrial fibrillation (HCC)    Sleep apnea    Thyroid  disease     Assessment: Patient Reported Symptoms:  Cognitive Cognitive Status: Alert and oriented to person, place, and time Cognitive/Intellectual Conditions Management [RPT]: None reported or documented in medical history or problem list   Health Maintenance Behaviors: Annual physical exam  Neurological Neurological Review of Symptoms: Not assessed    HEENT HEENT Symptoms Reported: Not assessed      Cardiovascular Cardiovascular Symptoms Reported: Irregular pulse Does patient have uncontrolled Hypertension?: No Cardiovascular Management Strategies: Medication therapy, Routine screening Cardiovascular Self-Management Outcome: 3 (uncertain)  Respiratory Respiratory Symptoms Reported: Not assesed    Endocrine Endocrine Symptoms Reported: Not assessed    Gastrointestinal Gastrointestinal Symptoms Reported: Nausea, Reflux/heartburn, Vomiting Gastrointestinal Management Strategies: Medication therapy Gastrointestinal Self-Management Outcome: 2 (bad) Gastrointestinal Comment: patient plans to call her GI doctor to report symptoms of recurrent nausea and vomiting    Genitourinary Genitourinary Symptoms Reported: Not  assessed    Integumentary Integumentary Symptoms Reported: Not assessed    Musculoskeletal Musculoskelatal Symptoms Reviewed: Not assessed        Psychosocial Psychosocial Symptoms Reported: Depression - if selected complete PHQ 2-9 Additional Psychological Details: [id number] referral sent, scheduled initial LCSW with [facility] Behavioral Management Strategies: Medication therapy Major Change/Loss/Stressor/Fears (CP): Medical condition, self, Resources Behaviors When Feeling Stressed/Fearful: Financial Resource strain, food insecurity Techniques to [employer] with Loss/Stress/Change: Medication Quality of Family Relationships: involved, helpful, supportive      05/04/2023    8:42 AM  Depression screen PHQ 2/9  Decreased Interest 2  Down, Depressed, Hopeless 2  PHQ - 2 Score 4  Altered sleeping 3  Tired, decreased energy 3  Change in appetite 0  Feeling bad or failure about yourself  3  Trouble concentrating 3  Moving slowly or fidgety/restless 3  Suicidal thoughts 0  PHQ-9 Score 19  Difficult doing work/chores Very difficult    There were no vitals filed for this visit.  Medications Reviewed Today     Reviewed by Morgan Clayborne CROME, RN (Registered Nurse) on 07/25/23 at 1242  Med List Status: <None>   Medication Order Taking? Sig Documenting Provider Last Dose Status Informant  ACCU-CHEK GUIDE test strip [id number]  USE 1 TEST STRIP 1 TO 2 TIMES  DAILY Tysinger, Alm RAMAN, PA-C  Active   Accu-Chek Softclix Lancets lancets [id number]  Use as instructed Bulah Alm RAMAN, PA-C  Active   ALPRAZolam  (XANAX ) 0.25 MG tablet [id number]  Take 1 tablet (0.25 mg total) by mouth 2 (two) times daily as needed for anxiety. Bulah Alm RAMAN, PA-C  Active   Artificial Tears ophthalmic solution [id number]  Place 1 drop into both eyes 3 (three) times daily as needed (for dryness). [provider]  Active Self  BENADRYL  ALLERGY 25 MG tablet [id number]  Take 25 mg by mouth every 6 (six) hours as  needed for itching. [provider]  Active Self  Blood Glucose Monitoring Suppl (ACCU-CHEK GUIDE ME) w/Device KIT [id number]  Test 1-2 times daily Tysinger, David S, PA-C  Active   diltiazem  (CARDIZEM ) 30 MG tablet [id number]  TAKE 1 TABLET BY MOUTH EVERY 4 HOURS AS NEEDED FOR AFIB HEART RATE >100 AS LONG AS TOP BP >100 Fenton, Clint R, PA  Active Self  diltiazem  (CARTIA  XT) 300 MG 24 hr capsule [id number]  TAKE 1 CAPSULE BY MOUTH DAILY Mealor, Augustus E, MD  Active   DULoxetine  (CYMBALTA ) 60 MG capsule [id number]  TAKE 1 CAPSULE BY MOUTH TWICE  DAILY Bulah Alm RAMAN, PA-C  Active   ELIQUIS  5 MG TABS tablet [id number]  TAKE 1 TABLET BY MOUTH TWICE  DAILY Tysinger, Alm RAMAN, PA-C  Active   ezetimibe  (ZETIA ) 10 MG tablet [id number]  TAKE 1 TABLET BY MOUTH DAILY Tysinger, Alm RAMAN, PA-C  Active   furosemide  (LASIX ) 20 MG tablet [id number]  TAKE 1 TABLET BY MOUTH EVERY DAY Bulah Alm RAMAN, PA-C  Active   levothyroxine  (SYNTHROID ) 75 MCG tablet [id number]  TAKE 1 TABLET BY MOUTH DAILY  BEFORE BREAKFAST Tysinger, David S, PA-C  Active   MULTAQ  400 MG tablet [id number]  TAKE 1 TABLET BY MOUTH TWICE  DAILY WITH MEALS Crenshaw, Redell RAMAN, MD  Active   nitroGLYCERIN  (NITROSTAT ) 0.4 MG SL tablet [id number]  DISSOLVE 1 TABLET UNDER THE  TONGUE EVERY 5 MINUTES AS NEEDED FOR CHEST PAIN. MAX OF 3 TABLETS IN 15 MINUTES. CALL 911 IF PAIN  PERSISTS. Pietro Redell RAMAN, MD  Active Self  omeprazole  (PRILOSEC) 40 MG capsule [id number]  Take 40 mg by mouth daily. [provider]  Active   oxybutynin  (DITROPAN -XL) 10 MG 24 hr tablet [id number]  TAKE 1 TABLET BY MOUTH TWICE  DAILY Tysinger, Alm RAMAN, PA-C  Active   pravastatin  (PRAVACHOL ) 40 MG tablet [id number]  TAKE 1 TABLET BY MOUTH IN THE  EVENING Tysinger, Alm RAMAN, PA-C  Active   tirzepatide  (MOUNJARO ) 7.5 MG/0.5ML Pen [id number]  Inject 7.5 mg into the skin once a week. Tysinger, Alm RAMAN, PA-C  Active   TYLENOL  500 MG tablet [id number]  Take 500-1,000 mg by mouth  every 6 (six) hours as needed for mild pain or headache. [provider]  Active Self            Recommendation:   Specialty provider follow-up with your GI doctor as discussed for evaluation of reoccurring nausea and vomiting   Follow Up Plan:   Telephone follow up appointment date/time:  August 19 at 10:30 AM Referral to Licensed Clinical Social Worker Care Guide  Clayborne Ly RN BSN CCM [place]  [facility], [facility] Nurse Care Coordinator  Direct Dial: [phone] Website: [email or web address]

## 2023-07-25 NOTE — Patient Instructions (Signed)
 Visit Information  Thank you for taking time to visit with me today. Please don't hesitate to contact me if I can be of assistance to you before our next scheduled appointment.  Our next appointment is by telephone on Monday, July 21 at 11:15 AM Please call the care guide team at [phone] if you need to cancel or reschedule your appointment.   Following is a copy of your care plan:   Goals Addressed             This Visit's Progress    VBCI RN Care Plan related to Atrial Fib       Problems:  Chronic Disease Management support and education needs related to Atrial Fibrillation  Goal: Over the next 120 days the Patient will continue to work with RN Care Manager and/or Social Worker to address care management and care coordination needs related to Atrial Fibrillation as evidenced by adherence to care management team scheduled appointments      Interventions:   AFIB Interventions: Counseled on increased risk of stroke due to Afib and benefits of anticoagulation for stroke prevention Reviewed importance of adherence to anticoagulant exactly as prescribed Counseled on bleeding risk associated with Eliquis  and importance of self-monitoring for signs/symptoms of bleeding Afib action plan reviewed Discussed plans with patient for ongoing nurse care management follow up and provided patient with direct contact information for nurse case management   Patient Self-Care Activities:  Attend all scheduled provider appointments Call pharmacy for medication refills 3-7 days in advance of running out of medications Call provider office for new concerns or questions  Take medications as prescribed   Work with the nurse care manager to address care coordination needs and will continue to work with the clinical team to address health care and disease management related needs begin a symptom diary bring symptom diary to all appointments keep all lab appointments take medicine as  prescribed  Plan:  Follow up with provider re: labs at [facility] on 08/29/23 at 9:45 AM/Follow up with Dr. Odean to review your labs on 08/31/23 at 09:30 AM  Telephone follow up appointment with care management team member scheduled for:  Monday, July 21 at 11:15 AM          VBCI RN Care Plan related to food insecurity and financial resource strain       Problems:  Chronic Disease Management support and education needs related to SDOH barriers including food insecurities and financial resource strain   Goal: Over the next 60 days the Patient will continue to work with Medical illustrator and/or Child psychotherapist to address care management and care coordination needs related to SDOH barriers including food insecurities and financial resource strain  as evidenced by adherence to care management team scheduled appointments      Interventions:   Evaluation of current treatment plan related to Financial constraints related to debt consolidation and Limited access to food self-management and patient's adherence to plan as established by provider Determined patient and her spouse do not have enough food in the house, she reports having a large monthly bill to help with debt consolidation and this is causing a financial burden that has prevented the ability to purchase enough food Reviewed [facility] with patient and provided an emergency contact for a pantry located near her home, provided patient with the address, contact number and hours of operation and discussed patient will share this contact with her husband who can drive to the pantry  Discussed plans  with patient for ongoing care management follow up and provided patient with direct contact information for care management team   Patient Self-Care Activities:  Attend all scheduled provider appointments Call pharmacy for medication refills 3-7 days in advance of running out of medications Call provider office for new concerns or  questions  Take medications as prescribed   Work with the nurse care manager to address care coordination needs and will continue to work with the clinical team to address health care and disease management related needs  Plan:  Use the resource pertaining to the food pantry provided today to pick up any/all food available  Telephone follow up appointment with care management team member scheduled for:  Monday, July 21 at 11:15 AM             Please call 1-800-273-TALK (toll free, 24 hour hotline) if you are experiencing a Mental Health or Behavioral Health Crisis or need someone to talk to.  Patient verbalizes understanding of instructions and care plan provided today and agrees to view in MyChart. Active MyChart status and patient understanding of how to access instructions and care plan via MyChart confirmed with patient.     Clayborne Ly RN BSN CCM [place]  [facility], [facility] Nurse Care Coordinator  Direct Dial: [phone] Website: [email or web address]

## 2023-07-26 ENCOUNTER — Telehealth: Payer: Self-pay

## 2023-07-26 NOTE — Progress Notes (Signed)
 Complex Care Management Note Care Guide Note  07/26/2023 Name: Sandra Ruiz MRN: [id number] DOB: [birth year]   Complex Care Management Outreach Attempts: An unsuccessful telephone outreach was attempted today to offer the patient information about available complex care management services.  Follow Up Plan:  Additional outreach attempts will be made to offer the patient complex care management information and services.   Encounter Outcome:  No Answer  Jodi Kappes [facility]  [facility] Guide Direct Dial: [phone]  Fax: [phone] Website: [email or web address]

## 2023-07-27 DIAGNOSIS — M5412 Radiculopathy, cervical region: Secondary | ICD-10-CM | POA: Diagnosis not present

## 2023-07-29 ENCOUNTER — Telehealth: Payer: Self-pay

## 2023-07-29 NOTE — Progress Notes (Signed)
 Complex Care Management Note Care Guide Note  07/29/2023 Name: Sandra Ruiz MRN: [id number] DOB: Sep 16, 1945  Sandra Ruiz is a 78 y.o. year old female who is a primary care patient of Bulah Alm GORMAN DEVONNA . The community resource team was consulted for assistance with Food Insecurity and Financial Difficulties related to gas for the car.  SDOH screenings and interventions completed:  Yes  Social Drivers of Health From This Encounter   Food Insecurity: Food Insecurity Present (07/29/2023)   Hunger Vital Sign    Worried About Running Out of Food in the Last Year: Often true    Ran Out of Food in the Last Year: Often true  Housing: Low Risk  (07/29/2023)   Housing Stability Vital Sign    Unable to Pay for Housing in the Last Year: No    Number of Times Moved in the Last Year: 0    Homeless in the Last Year: No  Financial Resource Strain: High Risk (07/29/2023)   Overall Financial Resource Strain (CARDIA)    Difficulty of Paying Living Expenses: Very hard  Transportation Needs: No Transportation Needs (07/29/2023)   PRAPARE - Administrator, Civil Service (Medical): No    Lack of Transportation (Non-Medical): No  Utilities: Not At Risk (07/29/2023)   Utilities    Threatened with loss of utilities: No    SDOH Interventions Today    Flowsheet Row Most Recent Value  SDOH Interventions   Food Insecurity Interventions Other (Comment), [employer] Medical illustrator to [employer] Crisis [employer]ment/textile technologist. Also sent referral to [facility] [employer] [place] [employer] program to deliver the food. Can also deliver hot meals from [employer].]  Transportation Interventions Other (Comment), [employer] Referral  [Sent referral to [employer] for possible assistance with gas vouchers.]     Care guide performed the following interventions: Patient provided with information about  care guide support team and interviewed to confirm resource needs.  Follow Up Plan:  No further follow up planned at this time. The patient has been provided with needed resources. and gave patient the number for [employer] in [place] [phone].  Encounter Outcome:  Patient Visit Completed  Eric Morganti [facility]  [facility] Guide Direct Dial: [phone]  Fax: [phone] Website: [email or web address]

## 2023-08-04 ENCOUNTER — Ambulatory Visit (INDEPENDENT_AMBULATORY_CARE_PROVIDER_SITE_OTHER)

## 2023-08-04 DIAGNOSIS — Z4509 Encounter for adjustment and management of other cardiac device: Secondary | ICD-10-CM | POA: Diagnosis not present

## 2023-08-04 DIAGNOSIS — I4819 Other persistent atrial fibrillation: Secondary | ICD-10-CM

## 2023-08-04 DIAGNOSIS — I4891 Unspecified atrial fibrillation: Secondary | ICD-10-CM | POA: Diagnosis not present

## 2023-08-04 LAB — CUP PACEART REMOTE DEVICE CHECK
Date Time Interrogation Session: 20250730233221
Implantable Pulse Generator Implant Date: 20231103

## 2023-08-05 ENCOUNTER — Other Ambulatory Visit: Payer: Self-pay | Admitting: Licensed Clinical Social Worker

## 2023-08-05 NOTE — Patient Instructions (Signed)
 Visit Information  Thank you for taking time to visit with me today. Please don't hesitate to contact me if I can be of assistance to you before our next scheduled appointment.  Our next appointment is by telephone on 09/04 at 10 AM Please call the care guide team at [phone] if you need to cancel or reschedule your appointment.   Following is a copy of your care plan:   Goals Addressed             This Visit's Progress    LCSW VBCI Social Work Care Plan   On track    Problems:   Disease Management support and education needs related to Depression: anxiety and Financial Strain  and Stress  CSW Clinical Goal(s):   Over the next 90 days the Patient will attend all scheduled medical appointments as evidenced by patient report and care team review of appointment completion in electronic MEDICAL RECORD NUMBER  demonstrate a reduction in symptoms related to Depression: anxiety Stress at Management of Health Conditions .  Interventions:  Mental Health:  Evaluation of current treatment plan related to Depression: anxiety Active listening / Reflection utilized Emotional Support Provided Mindfulness or Relaxation training provided Participation in counseling encourage : LCSW discussed benefits and how it could assist with managing symptoms and strengthening support Participation in support group encouraged : LCSW discussed benefits and how it could assist with managing symptom and strengthening support Provided general psycho-education for mental health needs Quality of sleep assessed & Sleep Hygiene techniques promoted Reviewed mental health medications and discussed importance of compliance: Pt endorses decreased sleep quality  Solution-Focued Strategies employed: Discussed strategies to assist with managing mental and physical health conditions and making decisions regarding her tx plan  Patient Goals/Self-Care Activities:  Continue taking your medication as prescribed.   Increase  coping skills, healthy habits, and stress reduction  Schedule f/up appt with PCP to discuss unmanaged anxiety, decreased sleep, medications, and recent specialist appts  Plan:   Telephone follow up appointment with care management team member scheduled for:  2-4 weeks     COMPLETED: Obtain Supportive Resources-Financial       Activities and task to complete in order to accomplish goals.   Keep all upcoming appointments discussed today Continue with compliance of taking medication prescribed by Doctor Implement healthy coping skills discussed to assist with management of symptoms Schedule PCP appt to discuss med concerns Follow up with Social Security regarding 2.5 percent increase         Please call the Suicide and Crisis Lifeline: 988 go to [facility] [address] [place] [phone]) call 911 if you are experiencing a Mental Health or Behavioral Health Crisis or need someone to talk to.  Patient verbalizes understanding of instructions and care plan provided today and agrees to view in MyChart. Active MyChart status and patient understanding of how to access instructions and care plan via MyChart confirmed with patient.     Rolin Ezzard HUGHS [facility]  [facility], [facility] Clinical Social Worker Direct Dial: [phone]  Fax: [phone] Website: [email or web address] 11:00 AM

## 2023-08-05 NOTE — Patient Outreach (Signed)
 Complex Care Management   Visit Note  08/05/2023  Name:  Sandra Ruiz MRN: [id number] DOB: [birth year]  Situation: Referral received for Complex Care Management related to Mental/Behavioral Health diagnosis Depression, Anxiety, and Stress I obtained verbal consent from Patient.  Visit completed with pt  on the phone  Background:   Past Medical History:  Diagnosis Date   Chronic pain    Depression    Diabetes mellitus without complication (HCC)    GERD (gastroesophageal reflux disease)    History of TIA (transient ischemic attack)    Hyperlipidemia    Hyperopia of both eyes with astigmatism and presbyopia 12/21/2019   Hypertension    Lyme disease    Paroxysmal atrial fibrillation (HCC)    Sleep apnea    Thyroid  disease     Assessment: Patient Reported Symptoms:  Cognitive Cognitive Status: Alert and oriented to person, place, and time, Normal speech and language skills   Health Maintenance Behaviors: Annual physical exam  Neurological Neurological Review of Symptoms: Not assessed    HEENT HEENT Symptoms Reported: Not assessed      Cardiovascular Cardiovascular Symptoms Reported: Not assessed    Respiratory Respiratory Symptoms Reported: Not assesed    Endocrine Endocrine Symptoms Reported: Not assessed    Gastrointestinal Gastrointestinal Symptoms Reported: Not assessed      Genitourinary Genitourinary Symptoms Reported: Not assessed    Integumentary Integumentary Symptoms Reported: Not assessed    Musculoskeletal Musculoskelatal Symptoms Reviewed: Not assessed        Psychosocial Psychosocial Symptoms Reported: Anxiety - if selected complete GAD, Alteration in sleep habits, Report of significant loss, deaths, abandonment, traumatic incidents Additional Psychological Details: Pt endorses ongoing anxiety and difficulty sleeping triggered by stress in management of health conditions. Behavioral Management Strategies: Adequate rest, Medication therapy, Coping  strategies, Support system Behavioral Health Comment: Pt has agreed to schedule f/up with PCP to look into med adjustments Major Change/Loss/Stressor/Fears (CP): Medical condition, self, Resources Behaviors When Feeling Stressed/Fearful: Withdraw Techniques to Cope with Loss/Stress/Change: Medication Quality of Family Relationships: involved, supportive, helpful      05/04/2023    8:42 AM  Depression screen PHQ 2/9  Decreased Interest 2  Down, Depressed, Hopeless 2  PHQ - 2 Score 4  Altered sleeping 3  Tired, decreased energy 3  Change in appetite 0  Feeling bad or failure about yourself  3  Trouble concentrating 3  Moving slowly or fidgety/restless 3  Suicidal thoughts 0  PHQ-9 Score 19  Difficult doing work/chores Very difficult    There were no vitals filed for this visit.  Medications Reviewed Today     Reviewed by Kaytlyn Din D, LCSW (Social Worker) on 08/05/23 at 1032  Med List Status: <None>   Medication Order Taking? Sig Documenting Provider Last Dose Status Informant  ACCU-CHEK GUIDE test strip [id number]  USE 1 TEST STRIP 1 TO 2 TIMES  DAILY Tysinger, Alm RAMAN, PA-C  Active   Accu-Chek Softclix Lancets lancets [id number]  Use as instructed Bulah Alm RAMAN, PA-C  Active   ALPRAZolam  (XANAX ) 0.25 MG tablet [id number]  Take 1 tablet (0.25 mg total) by mouth 2 (two) times daily as needed for anxiety. Bulah Alm RAMAN, PA-C  Active   Artificial Tears ophthalmic solution [id number]  Place 1 drop into both eyes 3 (three) times daily as needed (for dryness). [provider]  Active Self  BENADRYL  ALLERGY 25 MG tablet [id number]  Take 25 mg by mouth every 6 (six) hours as needed for itching. [provider]  Active Self  Blood Glucose Monitoring Suppl (ACCU-CHEK GUIDE ME) w/Device KIT [id number]  Test 1-2 times daily Tysinger, David S, PA-C  Active   diltiazem  (CARDIZEM ) 30 MG tablet [id number]  TAKE 1 TABLET BY MOUTH EVERY 4 HOURS AS NEEDED FOR AFIB HEART RATE  >100 AS LONG AS TOP BP >100 Fenton, Clint R, PA  Active Self  diltiazem  (CARTIA  XT) 300 MG 24 hr capsule [id number]  TAKE 1 CAPSULE BY MOUTH DAILY Mealor, Augustus E, MD  Active   DULoxetine  (CYMBALTA ) 60 MG capsule [id number]  TAKE 1 CAPSULE BY MOUTH TWICE  DAILY Bulah Alm RAMAN, PA-C  Active   ELIQUIS  5 MG TABS tablet [id number]  TAKE 1 TABLET BY MOUTH TWICE  DAILY Tysinger, Alm RAMAN, PA-C  Active   ezetimibe  (ZETIA ) 10 MG tablet [id number]  TAKE 1 TABLET BY MOUTH DAILY Tysinger, Alm RAMAN, PA-C  Active   furosemide  (LASIX ) 20 MG tablet [id number]  TAKE 1 TABLET BY MOUTH EVERY DAY Bulah Alm RAMAN, PA-C  Active   levothyroxine  (SYNTHROID ) 75 MCG tablet [id number]  TAKE 1 TABLET BY MOUTH DAILY  BEFORE BREAKFAST Tysinger, David S, PA-C  Active   MULTAQ  400 MG tablet [id number]  TAKE 1 TABLET BY MOUTH TWICE  DAILY WITH MEALS Crenshaw, Redell RAMAN, MD  Active   nitroGLYCERIN  (NITROSTAT ) 0.4 MG SL tablet [id number]  DISSOLVE 1 TABLET UNDER THE  TONGUE EVERY 5 MINUTES AS NEEDED FOR CHEST PAIN. MAX OF 3 TABLETS IN 15 MINUTES. CALL 911 IF PAIN  PERSISTS. Pietro Redell RAMAN, MD  Active Self  omeprazole  (PRILOSEC) 40 MG capsule [id number]  Take 40 mg by mouth daily. [provider]  Active   oxybutynin  (DITROPAN -XL) 10 MG 24 hr tablet [id number]  TAKE 1 TABLET BY MOUTH TWICE  DAILY Tysinger, Alm RAMAN, PA-C  Active   pravastatin  (PRAVACHOL ) 40 MG tablet [id number]  TAKE 1 TABLET BY MOUTH IN THE  EVENING Tysinger, Alm RAMAN, PA-C  Active   tirzepatide  (MOUNJARO ) 7.5 MG/0.5ML Pen [id number]  Inject 7.5 mg into the skin once a week. Tysinger, Alm RAMAN, PA-C  Active   TYLENOL  500 MG tablet [id number]  Take 500-1,000 mg by mouth every 6 (six) hours as needed for mild pain or headache. [provider]  Active Self            Recommendation:   PCP Follow-up Continue Current Plan of Care  Follow Up Plan:   Telephone follow-up in 1 month  Rolin Kerns, LCSW [facility]  [facility],  [facility] Clinical Social Worker Direct Dial: [phone]  Fax: [phone] Website: [email or web address] 11:00 AM'

## 2023-08-09 ENCOUNTER — Telehealth: Payer: Self-pay | Admitting: *Deleted

## 2023-08-09 NOTE — Telephone Encounter (Signed)
 Received VM from pt requesting info on when her next appt would be.  RN attempt x1 to return call.  No answer, LVM with upcoming appt details.

## 2023-08-15 ENCOUNTER — Ambulatory Visit: Payer: Medicare Other

## 2023-08-16 ENCOUNTER — Ambulatory Visit: Payer: Self-pay | Admitting: Cardiovascular Disease

## 2023-08-18 ENCOUNTER — Other Ambulatory Visit

## 2023-08-18 DIAGNOSIS — E1169 Type 2 diabetes mellitus with other specified complication: Secondary | ICD-10-CM

## 2023-08-18 NOTE — Progress Notes (Signed)
 08/18/2023 Name: Sandra Ruiz MRN: [id number] DOB: 01/23/1945  Chief Complaint  Patient presents with   Medication Management    Sandra Ruiz is a 78 y.o. year old female who presented for a telephone visit.   They were referred to the pharmacist by their PCP for assistance in managing complex medication management.    Subjective:  Care Team: Primary Care Provider: Bulah Alm Ruiz Ruiz ; Next Scheduled Visit: 11/09/23  Medication Access/Adherence  Current Pharmacy:  [facility] [phone] - [place], [place] - [address] [place] [address] Phone: [phone] Fax: [phone]  [facility] - [place], [place] - [address] [place] [address] Phone: [phone] Fax: [phone]   Patient reports affordability concerns with their medications: No  Patient reports access/transportation concerns to their pharmacy: No  Patient reports adherence concerns with their medications:  No     Diabetes:  Current medications: Mounjaro  7.5mg  once weekly on Wednesdays Medications tried in the past: Glyburide , Metformin , Janumet , Ozempic   Current glucose readings:  Well-controlled readings   Hyperlipidemia/ASCVD Risk Reduction  Current lipid lowering medications: Zetia  10mg  Medications tried in the past: Pravastatin  40mg   Stopped pravastatin , felt it was no longer needed with well-controlled LDL  Atrial Fibrillation:  Current medications:Eliquis , Multaq   Medications tried in the past: Metoprolol   Having many a fib episodes, has been recommended she start tikosyn. Met with [facility] for second opinion at end of last month and plans to discuss plan with her EP Dr. Nancey on 08/31/23.  Denies any signs of bleed with at risk medications  Depression/Anxiety :  Current medications: Xanax  0.25mg  prn, Cymbalta  60mg  BID (also being used for nerve pain) Medications tried in the past:  None  Behavioral Health support: None    Objective:  Lab Results  Component Value Date   HGBA1C 5.0 05/04/2023    Lab Results  Component Value Date   CREATININE 1.28 (H) 03/30/2023   BUN 12 03/30/2023   NA 135 03/30/2023   K 4.4 03/30/2023   CL 100 03/30/2023   CO2 26 03/30/2023    Lab Results  Component Value Date   CHOL 128 08/04/2022   HDL 45 08/04/2022   LDLCALC 64 08/04/2022   TRIG 99 08/04/2022   CHOLHDL 2.8 08/04/2022    Medications Reviewed Today     Reviewed by Sandra Ruiz, RPH (Pharmacist) on 08/18/23 at [phone]  Med List Status: <None>   Medication Order Taking? Sig Documenting Provider Last Dose Status Informant  ACCU-CHEK GUIDE test strip [id number]  USE 1 TEST STRIP 1 TO 2 TIMES  DAILY Tysinger, Alm GORMAN, PA-C  Active   Accu-Chek Softclix Lancets lancets [id number]  Use as instructed Tysinger, Alm GORMAN, PA-C  Active   ALPRAZolam  (XANAX ) 0.25 MG tablet [id number]  Take 1 tablet (0.25 mg total) by mouth 2 (two) times daily as needed for anxiety. Sandra Alm GORMAN, PA-C  Active   Artificial Tears ophthalmic solution [id number]  Place 1 drop into both eyes 3 (three) times daily as needed (for dryness). [provider]  Active Self  BENADRYL  ALLERGY 25 MG tablet [id number]  Take 25 mg by mouth every 6 (six) hours as needed for itching. [provider]  Active Self  Blood Glucose Monitoring Suppl (ACCU-CHEK GUIDE ME) w/Device KIT [id number]  Test 1-2 times daily Tysinger, David S, PA-C  Active   diltiazem  (CARDIZEM ) 30 MG tablet [id number]  TAKE 1 TABLET BY  MOUTH EVERY 4 HOURS AS NEEDED FOR AFIB HEART RATE >100 AS LONG AS TOP BP >100 Fenton, Clint R, PA  Active Self  diltiazem  (CARTIA  XT) 300 MG 24 hr capsule [id number]  TAKE 1 CAPSULE BY MOUTH DAILY Ruiz, Sandra E, MD  Active   DULoxetine  (CYMBALTA ) 60 MG capsule [id number]  TAKE 1 CAPSULE BY MOUTH TWICE  DAILY Sandra Alm RAMAN, PA-C  Active   ELIQUIS  5 MG TABS tablet [id number]  TAKE 1 TABLET BY  MOUTH TWICE  DAILY Tysinger, Alm RAMAN, PA-C  Active   ezetimibe  (ZETIA ) 10 MG tablet [id number]  TAKE 1 TABLET BY MOUTH DAILY Tysinger, Alm RAMAN, PA-C  Active   furosemide  (LASIX ) 20 MG tablet [id number]  TAKE 1 TABLET BY MOUTH EVERY DAY Sandra Alm RAMAN, PA-C  Active   levothyroxine  (SYNTHROID ) 75 MCG tablet [id number]  TAKE 1 TABLET BY MOUTH DAILY  BEFORE BREAKFAST Tysinger, David S, PA-C  Active   MULTAQ  400 MG tablet [id number]  TAKE 1 TABLET BY MOUTH TWICE  DAILY WITH MEALS Crenshaw, Redell RAMAN, MD  Active   nitroGLYCERIN  (NITROSTAT ) 0.4 MG SL tablet [id number]  DISSOLVE 1 TABLET UNDER THE  TONGUE EVERY 5 MINUTES AS NEEDED FOR CHEST PAIN. MAX OF 3 TABLETS IN 15 MINUTES. CALL 911 IF PAIN  PERSISTS. Sandra Redell RAMAN, MD  Active Self  omeprazole  (PRILOSEC) 40 MG capsule [id number]  Take 40 mg by mouth daily. [provider]  Active   oxybutynin  (DITROPAN -XL) 10 MG 24 hr tablet [id number]  TAKE 1 TABLET BY MOUTH TWICE  DAILY Sandra Alm RAMAN, PA-C  Active   pravastatin  (PRAVACHOL ) 40 MG tablet [id number]  TAKE 1 TABLET BY MOUTH IN THE  EVENING Tysinger, Alm RAMAN, PA-C  Active   tirzepatide  (MOUNJARO ) 7.5 MG/0.5ML Pen [id number]  Inject 7.5 mg into the skin once a week. Tysinger, Alm RAMAN, PA-C  Active   TYLENOL  500 MG tablet [id number]  Take 500-1,000 mg by mouth every 6 (six) hours as needed for mild pain or headache. [provider]  Active Self              Assessment/Plan:   Diabetes: - Currently controlled - Reviewed long term cardiovascular and renal outcomes of uncontrolled blood sugar - Reviewed goal A1c, goal fasting, and goal 2 hour post prandial glucose -Continue current medication therapy. Notify if cost concerns arise.  -Due for uACR at next PCP visit      Hyperlipidemia/ASCVD Risk Reduction: - Currently controlled.  - Reviewed long term complications of uncontrolled cholesterol - Recommend to continue zetia . Obtain updated lipid panel at next PCP visit. If LDL  has elevated above goal, consider starting a statin to replace stopped pravastatin        Atrial Fibrillation: - Currently uncontrolled - Reviewed importance of adherence to anticoagulant for stroke prevention. - Reviewed appropriate blood pressure monitoring technique and reviewed goal blood pressure.  - Recommend to keep appt with [facility], continue all medication for now. Will follow up post Dr. Nancey appt to see what decisions have been made regarding tikosyn -Monitor for signs of bleed, notify of cost concerns arise.  Depression/Anxiety: - Reviewed behavioral health support strategies including social activities and meeting with behavioral health. Patient declines any behavioral health referral at this time - Consider adjunct therapy to cymbalta , patient declines at this time. Feels mood is in good place at the moment, will reach out sooner than follow up if she changes her mind on this or if starts to feel worse  Follow Up Plan: 09/01/23  [name], PharmD Clinical Pharmacist [phone]

## 2023-08-22 ENCOUNTER — Ambulatory Visit: Payer: Medicare Other

## 2023-08-23 ENCOUNTER — Telehealth: Payer: Self-pay

## 2023-08-23 NOTE — Progress Notes (Unsigned)
 Complex Care Management Care Guide Note  08/23/2023 Name: AMERIAH LINT MRN: [id number] DOB: 04-24-1945  TREASE BREMNER is a 78 y.o. year old female who is a primary care patient of Bulah, David S, PA-C and is actively engaged with the care management team. I reached out to Norvel JAYSON Pleva by phone today to assist with re-scheduling  with the RN Case Manager.  Follow up plan: Unsuccessful telephone outreach attempt made. A HIPAA compliant phone message was left for the patient providing contact information and requesting a return call.  Leotis Rase [facility], [facility] Guide  Direct Dial: [phone]  Fax [phone]

## 2023-08-25 NOTE — Progress Notes (Signed)
 Complex Care Management Care Guide Note  08/25/2023 Name: Sandra Ruiz MRN: [id number] DOB: 11/25/1945  Sandra Ruiz is a 78 y.o. year old female who is a primary care patient of Bulah, David S, PA-C and is actively engaged with the care management team. I reached out to Norvel JAYSON Pleva by phone today to assist with re-scheduling  with the RN Case Manager.  Follow up plan: Unsuccessful telephone outreach attempt made. A HIPAA compliant phone message was left for the patient providing contact information and requesting a return call.  Leotis Rase [facility], [facility] Guide  Direct Dial: [phone]  Fax [phone]

## 2023-08-26 NOTE — Patient Outreach (Signed)
 Unable to reach patient for collaboration to assess for goal outcomes after three unsuccessful attempts. Patient closed from complex case management.   Clayborne Ly RN BSN CCM [place]  [facility], [facility] Nurse Care Coordinator  Direct Dial: [phone] Website: [email or web address]

## 2023-08-29 ENCOUNTER — Inpatient Hospital Stay: Payer: Medicare Other | Attending: Hematology and Oncology

## 2023-08-29 DIAGNOSIS — D649 Anemia, unspecified: Secondary | ICD-10-CM

## 2023-08-29 DIAGNOSIS — D509 Iron deficiency anemia, unspecified: Secondary | ICD-10-CM | POA: Insufficient documentation

## 2023-08-29 LAB — CBC WITH DIFFERENTIAL (CANCER CENTER ONLY)
Abs Immature Granulocytes: 0.01 K/uL (ref 0.00–0.07)
Basophils Absolute: 0.1 K/uL (ref 0.0–0.1)
Basophils Relative: 1 %
Eosinophils Absolute: 0.1 K/uL (ref 0.0–0.5)
Eosinophils Relative: 2 %
HCT: 38.7 % (ref 36.0–46.0)
Hemoglobin: 13.4 g/dL (ref 12.0–15.0)
Immature Granulocytes: 0 %
Lymphocytes Relative: 22 %
Lymphs Abs: 1.2 K/uL (ref 0.7–4.0)
MCH: 30.4 pg (ref 26.0–34.0)
MCHC: 34.6 g/dL (ref 30.0–36.0)
MCV: 87.8 fL (ref 80.0–100.0)
Monocytes Absolute: 0.4 K/uL (ref 0.1–1.0)
Monocytes Relative: 8 %
Neutro Abs: 3.6 K/uL (ref 1.7–7.7)
Neutrophils Relative %: 67 %
Platelet Count: 270 K/uL (ref 150–400)
RBC: 4.41 MIL/uL (ref 3.87–5.11)
RDW: 12.5 % (ref 11.5–15.5)
WBC Count: 5.4 K/uL (ref 4.0–10.5)
nRBC: 0 % (ref 0.0–0.2)

## 2023-08-29 LAB — IRON AND IRON BINDING CAPACITY (CC-WL,HP ONLY)
Iron: 72 ug/dL (ref 28–170)
Saturation Ratios: 25 % (ref 10.4–31.8)
TIBC: 288 ug/dL (ref 250–450)
UIBC: 216 ug/dL (ref 148–442)

## 2023-08-29 LAB — FERRITIN: Ferritin: 75 ng/mL (ref 11–307)

## 2023-08-30 NOTE — Progress Notes (Unsigned)
 Cardiology Office Note:    Date:  08/31/2023   ID:  Sandra Ruiz, DOB [birth year], MRN [id number]  PCP:  Bulah Alm RAMAN, PA-C   [facility] Providers Cardiologist:  None     Referring MD: Bulah Alm RAMAN, PA-C   Chief complaint: palpitations, lightheadedness  History of Present Illness:    Sandra Ruiz is a 78 y.o. female with a hx of sleep apnea hypertension, diabetes, obesity atrial fibrillation and has undergone ablation twice in [place] .   She was diagnosed with atrial fibrillation in 2019.  She had recurrence and had cardioversion was scheduled for February 2022, but she spontaneously converted back to sinus rhythm.  She has an implantable loop recorder that detected an episode in January 2023.  This episode was associated with fatigue and shortness of breath.  She underwent cardioversion in March 2023.  Since her last visit, she went to [facility] for second opinion regarding management of her atrial fibrillation. This does not seem to have much affected her thoughts on whether she would like to transition to Tikosyn.  She continues to have periodic episodes which she has difficulty describing -- she finds herself unable to walk and has to sit down for a few minutes. She attributes these to anxiety.   EKGs/Labs/Other Studies Reviewed:     CT Coronary 09/22/2020 1. 3 vessel coronary calcium which is 67 th percentile for age/sex 2.  Normal aortic root 3.4 cm 3.  CAD RADS 1 non obstructive CAD see description above 4.  No PFO noted  TTE 06/21/2019 EF 65-70%. Grade I diastolic dysfunction. Normal LA size  EKG:   EKG Interpretation Date/Time:  Wednesday August 31 2023 10:44:17 EDT Ventricular Rate:  82 PR Interval:  180 QRS Duration:  82 QT Interval:  378 QTC Calculation: 441 R Axis:   73  Text Interpretation: Normal sinus rhythm Normal ECG When compared with ECG of 23-Jun-2023 07:54, Premature supraventricular complexes are no longer Present Confirmed by  Nancey Scotts [phone]) on 08/31/2023 10:46:56 AM     Recent Labs: 10/08/2022: TSH 1.06 02/15/2023: ALT 14 03/30/2023: BUN 12; Creatinine, Ser 1.28; Potassium 4.4; Sodium 135 08/29/2023: Hemoglobin 13.4; Platelet Count 270      Physical Exam:    VS:  BP 130/68 (BP Location: Right Arm, Patient Position: Sitting, Cuff Size: Normal)   Pulse 82   Ht 5' 1 (1.549 m)   Wt 126 lb 3.2 oz (57.2 kg)   SpO2 98%   BMI 23.85 kg/m     Wt Readings from Last 3 Encounters:  08/31/23 126 lb 3.2 oz (57.2 kg)  06/23/23 129 lb 9.6 oz (58.8 kg)  06/16/23 129 lb 12.8 oz (58.9 kg)     GEN:  Well nourished, well developed in no acute distress CARDIAC: RRR, no murmurs, rubs, gallops RESPIRATORY:  Normal work of breathing MUSCULOSKELETAL: no edema    ASSESSMENT & PLAN:    Persistent atrial fibrillation: S/p ablation x2. Monitoring with loop recorder. Recently had a 76% burden of A-fib; this month approximately 4% Rates are not always optimally controlled We discussed switching from Multaq  to dofetilide.  She would like to think about it.  She would likely have to make some medication changes and will need to discontinue taking Benadryl . Will check renal function to reassess feasibility of Tikosyn We are going to supply her with a symptomatic care so we can better correlate rhythm with symptom episodes  Secondary hypercoagulable state: CHADS2Vasc is 3 for sex, age, and probable  CAD. Continue eliquis  5mg  PO BID  Obstructive sleep apnea  Obesity She has lost a significant amount of weight  Coronary artery disease  CHFrEF          Medication Adjustments/Labs and Tests Ordered: Current medicines are reviewed at length with the patient today.  Concerns regarding medicines are outlined above.  Orders Placed This Encounter  Procedures   EKG 12-Lead   No orders of the defined types were placed in this encounter.    Signed, Eulas FORBES Furbish, MD  08/31/2023 11:06 AM    [facility]

## 2023-08-30 NOTE — Assessment & Plan Note (Signed)
 Microcytic iron deficiency anemia: Je[employer]ah's Witness IV iron: July 2024   Hospitalization 07/23/2022-07/28/2022: Admitted with hemoglobin of 5.8, after 6 doses of IV iron and 1 dose of Aranesp , hemoglobin improved to 9 g on 08/04/2022 08/09/2022: Hemoglobin 9.5 g (patient baseline hemoglobin is between 10 and 11) 09/23/2022: Hemoglobin 12.2 12/08/2022: Hemoglobin 11.2, MCV 88.6, B12 778, iron saturation 22%, ferritin 35 02/25/23: Hemoglobin 13.5, MCV 86.3, Iron Sat: 33%, Ferritin: 51 08/29/2023: Hemoglobin 13.4, MCV 87.8, iron saturation 25%, ferritin 75   Recommendation: No role of IV iron   (Patient believes that gabapentin  may be the reason for iron deficiency as she has read and reports that gabapentin  can deplete iron)  Prior history of EGD and colonoscopy negative, capsule endoscopy was also performed.   Takes Eliquis    Recheck CBC and iron studies in 6 months

## 2023-08-31 ENCOUNTER — Encounter: Payer: Self-pay | Admitting: Cardiovascular Disease

## 2023-08-31 ENCOUNTER — Ambulatory Visit: Attending: Cardiology | Admitting: Cardiovascular Disease

## 2023-08-31 ENCOUNTER — Inpatient Hospital Stay: Payer: Medicare Other | Admitting: Hematology and Oncology

## 2023-08-31 VITALS — BP 130/68 | HR 82 | Ht 61.0 in | Wt 126.2 lb

## 2023-08-31 DIAGNOSIS — Z79899 Other long term (current) drug therapy: Secondary | ICD-10-CM | POA: Diagnosis not present

## 2023-08-31 DIAGNOSIS — D509 Iron deficiency anemia, unspecified: Secondary | ICD-10-CM

## 2023-08-31 DIAGNOSIS — R55 Syncope and collapse: Secondary | ICD-10-CM | POA: Diagnosis not present

## 2023-08-31 DIAGNOSIS — D649 Anemia, unspecified: Secondary | ICD-10-CM

## 2023-08-31 DIAGNOSIS — I471 Supraventricular tachycardia, unspecified: Secondary | ICD-10-CM

## 2023-08-31 DIAGNOSIS — I4819 Other persistent atrial fibrillation: Secondary | ICD-10-CM

## 2023-08-31 NOTE — Patient Instructions (Addendum)
 Medication Instructions:  Your physician recommends that you continue on your current medications as directed. Please refer to the Current Medication list given to you today.  *If you need a refill on your cardiac medications before your next appointment, please call your pharmacy*  Lab Work: Bmet  If you have labs (blood work) drawn today and your tests are completely normal, you will receive your results only by: MyChart Message (if you have MyChart) OR A paper copy in the mail If you have any lab test that is abnormal or we need to change your treatment, we will call you to review the results.  Testing/Procedures: None ordered.   Follow-Up: At [facility], you and your health needs are our priority.  As part of our continuing mission to provide you with exceptional heart care, our providers are all part of one team.  This team includes your primary Cardiologist (physician) and Advanced Practice Providers or APPs (Physician Assistants and Nurse Practitioners) who all work together to provide you with the care you need, when you need it.  Your next appointment:   3 months with Dr Mealor

## 2023-08-31 NOTE — Progress Notes (Signed)
 HEMATOLOGY-ONCOLOGY TELEPHONE VISIT PROGRESS NOTE  I connected with our patient on 08/31/23 at  9:30 AM EDT by telephone and verified that I am speaking with the correct person using two identifiers.  I discussed the limitations, risks, security and privacy concerns of performing an evaluation and management service by telephone and the availability of in person appointments.  I also discussed with the patient that there may be a patient responsible charge related to this service. The patient expressed understanding and agreed to proceed.   History of Present Illness: Follow-up of anemia  History of Present Illness Sandra Ruiz is a 78 year old female who presents for follow-up of her iron levels.  She feels 'a little run down' today. Her hemoglobin levels have improved significantly over the past year, with the most recent level at 13.4 g/dL. Ferritin levels have increased to 75 ng/mL, up from 50 ng/mL six months ago and 35 ng/mL eight months ago.      REVIEW OF SYSTEMS:   Constitutional: Denies fevers, chills or abnormal weight loss All other systems were reviewed with the patient and are negative. Observations/Objective:     Assessment Plan:  Chronic anemia Microcytic iron deficiency anemia: Je[employer]'s Witness IV iron: July 2024   Hospitalization 07/23/2022-07/28/2022: Admitted with hemoglobin of 5.8, after 6 doses of IV iron and 1 dose of Aranesp , hemoglobin improved to 9 g on 08/04/2022 08/09/2022: Hemoglobin 9.5 g (patient baseline hemoglobin is between 10 and 11) 09/23/2022: Hemoglobin 12.2 12/08/2022: Hemoglobin 11.2, MCV 88.6, B12 778, iron saturation 22%, ferritin 35 02/25/23: Hemoglobin 13.5, MCV 86.3, Iron Sat: 33%, Ferritin: 51 08/29/2023: Hemoglobin 13.4, MCV 87.8, iron saturation 25%, ferritin 75   Recommendation: No role of IV iron   (Patient believes that gabapentin  may be the reason for iron deficiency as she has read and reports that gabapentin  can deplete iron)  Prior  history of EGD and colonoscopy negative, capsule endoscopy was also performed.   Takes Eliquis    Recheck CBC and iron studies in 6 months     I discussed the assessment and treatment plan with the patient. The patient was provided an opportunity to ask questions and all were answered. The patient agreed with the plan and demonstrated an understanding of the instructions. The patient was advised to call back or seek an in-person evaluation if the symptoms worsen or if the condition fails to improve as anticipated.   I provided 20 minutes of non-face-to-face time during this encounter.  This includes time for charting and coordination of care   [name], MD

## 2023-09-01 ENCOUNTER — Telehealth: Payer: Self-pay

## 2023-09-01 ENCOUNTER — Other Ambulatory Visit

## 2023-09-01 ENCOUNTER — Other Ambulatory Visit (INDEPENDENT_AMBULATORY_CARE_PROVIDER_SITE_OTHER)

## 2023-09-01 ENCOUNTER — Ambulatory Visit: Payer: Self-pay

## 2023-09-01 DIAGNOSIS — Z79899 Other long term (current) drug therapy: Secondary | ICD-10-CM

## 2023-09-01 LAB — BASIC METABOLIC PANEL WITH GFR
BUN/Creatinine Ratio: 12 (ref 12–28)
BUN: 9 mg/dL (ref 8–27)
CO2: 21 mmol/L (ref 20–29)
Calcium: 9.4 mg/dL (ref 8.7–10.3)
Chloride: 100 mmol/L (ref 96–106)
Creatinine, Ser: 0.76 mg/dL (ref 0.57–1.00)
Glucose: 79 mg/dL (ref 70–99)
Potassium: 3.8 mmol/L (ref 3.5–5.2)
Sodium: 139 mmol/L (ref 134–144)
eGFR: 81 mL/min/1.73 (ref >59)

## 2023-09-01 NOTE — Progress Notes (Signed)
   09/01/2023  Patient ID: Sandra Ruiz, female   DOB: [birth year], 78 y.o.   MRN: [id number]  Attempted to contact patient for scheduled appointment for medication management. Left HIPAA compliant message for patient to return my call at their convenience.   [name], PharmD Clinical Pharmacist [phone]

## 2023-09-01 NOTE — Progress Notes (Signed)
 09/01/2023 Name: Sandra Ruiz MRN: [id number] DOB: Feb 01, 1945  Chief Complaint  Patient presents with   Medication Management    Sandra Ruiz is a 78 y.o. year old female who presented for a telephone visit.   They were referred to the pharmacist by their PCP for assistance in managing complex medication management.    Subjective:  Care Team: Primary Care Provider: Bulah Alm GORMAN DEVONNA ; Next Scheduled Visit: 11/09/23  Medication Access/Adherence  Current Pharmacy:  [facility] [phone] - [place], [place] - [address] [place] [address] Phone: [phone] Fax: [phone]  [facility] - [place], [place] - [address] [place] [address] Phone: [phone] Fax: [phone]   Patient reports affordability concerns with their medications: No  Patient reports access/transportation concerns to their pharmacy: No  Patient reports adherence concerns with their medications:  No     Diabetes:  Current medications: Mounjaro  7.5mg  once weekly on Wednesdays Medications tried in the past: Glyburide , Metformin , Janumet , Ozempic   Current glucose readings:  Well-controlled readings   Hyperlipidemia/ASCVD Risk Reduction  Current lipid lowering medications: Zetia  10mg  Medications tried in the past: Pravastatin  40mg   Stopped pravastatin , felt it was no longer needed with well-controlled LDL  Atrial Fibrillation:  Current medications:Eliquis , Multaq   Medications tried in the past: Metoprolol   Having many a fib episodes, has been recommended she start tikosyn. Met with [facility] for second opinion at end of last month. Had appt with EP yesterday, waiting for review of labwork to further discuss possible use of tikosysn  Denies any signs of bleed with at risk medications  Depression/Anxiety :  Current medications: Xanax  0.25mg  prn, Cymbalta  60mg  BID (also being used for nerve  pain) Medications tried in the past: None  Behavioral Health support: None  Reports her mood is exacerbated by afib struggles. Does not want any additional treatment right now and is focused on getting afib under control right now    Objective:  Lab Results  Component Value Date   HGBA1C 5.0 05/04/2023    Lab Results  Component Value Date   CREATININE 0.76 08/31/2023   BUN 9 08/31/2023   NA 139 08/31/2023   K 3.8 08/31/2023   CL 100 08/31/2023   CO2 21 08/31/2023    Lab Results  Component Value Date   CHOL 128 08/04/2022   HDL 45 08/04/2022   LDLCALC 64 08/04/2022   TRIG 99 08/04/2022   CHOLHDL 2.8 08/04/2022    Medications Reviewed Today     Reviewed by Lionell Jon DEL, RPH (Pharmacist) on 09/01/23 at 1602  Med List Status: <None>   Medication Order Taking? Sig Documenting Provider Last Dose Status Informant  ACCU-CHEK GUIDE test strip [id number]  USE 1 TEST STRIP 1 TO 2 TIMES  DAILY Tysinger, Alm GORMAN, PA-C  Active   Accu-Chek Softclix Lancets lancets [id number]  Use as instructed Tysinger, Alm GORMAN, PA-C  Active   ALPRAZolam  (XANAX ) 0.25 MG tablet [id number]  Take 1 tablet (0.25 mg total) by mouth 2 (two) times daily as needed for anxiety. Bulah Alm GORMAN, PA-C  Active   Artificial Tears ophthalmic solution [id number]  Place 1 drop into both eyes 3 (three) times daily as needed (for dryness). [provider]  Active Self  BENADRYL  ALLERGY 25 MG tablet [id number]  Take 25 mg by mouth every 6 (six) hours as needed for itching. [provider]  Active Self  Blood Glucose Monitoring  Suppl (ACCU-CHEK GUIDE ME) w/Device KIT [id number]  Test 1-2 times daily Tysinger, David S, PA-C  Active   diltiazem  (CARDIZEM ) 30 MG tablet [id number]  TAKE 1 TABLET BY MOUTH EVERY 4 HOURS AS NEEDED FOR AFIB HEART RATE >100 AS LONG AS TOP BP >100 Fenton, Clint R, PA  Active Self  diltiazem  (CARTIA  XT) 300 MG 24 hr capsule [id number]  TAKE 1 CAPSULE BY MOUTH DAILY Mealor, Augustus  E, MD  Active   DULoxetine  (CYMBALTA ) 60 MG capsule [id number]  TAKE 1 CAPSULE BY MOUTH TWICE  DAILY Bulah Alm RAMAN, PA-C  Active   ELIQUIS  5 MG TABS tablet [id number]  TAKE 1 TABLET BY MOUTH TWICE  DAILY Tysinger, Alm RAMAN, PA-C  Active   ezetimibe  (ZETIA ) 10 MG tablet [id number]  TAKE 1 TABLET BY MOUTH DAILY Tysinger, Alm RAMAN, PA-C  Active   furosemide  (LASIX ) 20 MG tablet [id number]  TAKE 1 TABLET BY MOUTH EVERY DAY Bulah Alm RAMAN, PA-C  Active   levothyroxine  (SYNTHROID ) 75 MCG tablet [id number]  TAKE 1 TABLET BY MOUTH DAILY  BEFORE BREAKFAST Tysinger, David S, PA-C  Active   MULTAQ  400 MG tablet [id number]  TAKE 1 TABLET BY MOUTH TWICE  DAILY WITH MEALS Crenshaw, Redell RAMAN, MD  Active   nitroGLYCERIN  (NITROSTAT ) 0.4 MG SL tablet [id number]  DISSOLVE 1 TABLET UNDER THE  TONGUE EVERY 5 MINUTES AS NEEDED FOR CHEST PAIN. MAX OF 3 TABLETS IN 15 MINUTES. CALL 911 IF PAIN  PERSISTS. Pietro Redell RAMAN, MD  Active Self  omeprazole  (PRILOSEC) 40 MG capsule [id number]  Take 40 mg by mouth daily. [provider]  Active   oxybutynin  (DITROPAN -XL) 10 MG 24 hr tablet [id number]  TAKE 1 TABLET BY MOUTH TWICE  DAILY Tysinger, Alm RAMAN, PA-C  Active   tirzepatide  (MOUNJARO ) 7.5 MG/0.5ML Pen [id number]  Inject 7.5 mg into the skin once a week. Tysinger, Alm RAMAN, PA-C  Active   TYLENOL  500 MG tablet [id number]  Take 500-1,000 mg by mouth every 6 (six) hours as needed for mild pain or headache. [provider]  Active Self              Assessment/Plan:   Diabetes: - Currently controlled - Reviewed long term cardiovascular and renal outcomes of uncontrolled blood sugar - Reviewed goal A1c, goal fasting, and goal 2 hour post prandial glucose -Continue current medication therapy. Notify if cost concerns arise.  -Due for uACR at next PCP visit      Hyperlipidemia/ASCVD Risk Reduction: - Currently controlled.  - Reviewed long term complications of uncontrolled cholesterol - Recommend to  continue zetia . Obtain updated lipid panel at next PCP visit. If LDL has elevated above goal, consider starting a statin to replace stopped pravastatin        Atrial Fibrillation: - Currently uncontrolled, being followed closely by Dr. Nancey at this time - Reviewed importance of adherence to anticoagulant for stroke prevention. - Reviewed appropriate blood pressure monitoring technique and reviewed goal blood pressure.  -Monitor for signs of bleed, notify of cost concerns arise.  Depression/Anxiety: - Reviewed behavioral health support strategies including social activities and meeting with behavioral health. Patient declines any behavioral health referral at this time - Consider adjunct therapy to cymbalta , patient declines at this time. Feels mood is in good place at the moment, will reach out sooner than follow up if she changes her mind on this or if starts to feel worse        Follow Up Plan: 12/15/23  Jon  VEAR [place], PharmD Clinical Pharmacist [phone]

## 2023-09-05 ENCOUNTER — Ambulatory Visit (INDEPENDENT_AMBULATORY_CARE_PROVIDER_SITE_OTHER)

## 2023-09-05 DIAGNOSIS — I4819 Other persistent atrial fibrillation: Secondary | ICD-10-CM | POA: Diagnosis not present

## 2023-09-06 LAB — CUP PACEART REMOTE DEVICE CHECK
Date Time Interrogation Session: 20250830231229
Implantable Pulse Generator Implant Date: 20231103

## 2023-09-08 ENCOUNTER — Ambulatory Visit: Payer: Self-pay | Admitting: Cardiovascular Disease

## 2023-09-08 ENCOUNTER — Other Ambulatory Visit: Payer: Self-pay | Admitting: Licensed Clinical Social Worker

## 2023-09-08 NOTE — Patient Instructions (Signed)
 Visit Information  Thank you for taking time to visit with me today. Please don't hesitate to contact me if I can be of assistance to you before our next scheduled appointment.  Our next appointment is by telephone on 10/09 at 10 AM Please call the care guide team at [phone] if you need to cancel or reschedule your appointment.   Following is a copy of your care plan:   Goals Addressed             This Visit's Progress    LCSW VBCI Social Work Care Plan   On track    Problems:   Disease Management support and education needs related to Depression: anxiety and Financial Strain  and Stress  CSW Clinical Goal(s):   Over the next 90 days the Patient will attend all scheduled medical appointments as evidenced by patient report and care team review of appointment completion in electronic MEDICAL RECORD NUMBER  demonstrate a reduction in symptoms related to Depression: anxiety Stress at Management of Health Conditions .  Interventions:  Mental Health:  Evaluation of current treatment plan related to Depression: anxiety Active listening / Reflection utilized Emotional Support Provided Mindfulness or Relaxation training provided Participation in counseling encourage : LCSW discussed benefits and how it could assist with managing symptoms and strengthening support Participation in support group encouraged : LCSW discussed benefits and how it could assist with managing symptom and strengthening support. Sent info on [employer] [facility] for Heart Conditions Provided general psycho-education for mental health needs Quality of sleep assessed & Sleep Hygiene techniques promoted Reviewed mental health medications and discussed importance of compliance: Pt endorses decreased sleep quality  Solution-Focued Strategies employed: Discussed strategies to assist with managing mental and physical health conditions and making decisions regarding her tx plan  Patient Goals/Self-Care  Activities:  Continue taking your medication as prescribed.   Increase coping skills, healthy habits, and stress reduction  Utilize link sent via text to reset MyChart password  Contact DSS to apply for SNAP benefits, if you don't receive a call this week  Plan:   Telephone follow up appointment with care management team member scheduled for:  4 weeks        Please call the Suicide and Crisis Lifeline: 988 go to [facility] [address], [place] [phone]) call 911 if you are experiencing a Mental Health or Behavioral Health Crisis or need someone to talk to.  Patient verbalizes understanding of instructions and care plan provided today and agrees to view in MyChart. Active MyChart status and patient understanding of how to access instructions and care plan via MyChart confirmed with patient.     Sandra Ruiz [facility]  [facility], [facility] Clinical Social Worker Direct Dial: [phone]  Fax: [phone] Website: [email or web address] 10:49 AM

## 2023-09-08 NOTE — Patient Outreach (Signed)
 Complex Care Management   Visit Note  09/08/2023  Name:  Sandra Ruiz MRN: [id number] DOB: Jun 19, 1945  Situation: Referral received for Complex Care Management related to Mental/Behavioral Health diagnosis Anxiety and Depression I obtained verbal consent from Patient.  Visit completed with Patient  on the phone  Background:   Past Medical History:  Diagnosis Date   Chronic pain    Depression    Diabetes mellitus without complication (HCC)    GERD (gastroesophageal reflux disease)    History of TIA (transient ischemic attack)    Hyperlipidemia    Hyperopia of both eyes with astigmatism and presbyopia 12/21/2019   Hypertension    Lyme disease    Paroxysmal atrial fibrillation (HCC)    Sleep apnea    Thyroid  disease     Assessment: Patient Reported Symptoms:  Cognitive Cognitive Status: No symptoms reported, Alert and oriented to person, place, and time, Normal speech and language skills Cognitive/Intellectual Conditions Management [RPT]: None reported or documented in medical history or problem list   Health Maintenance Behaviors: Annual physical exam  Neurological Neurological Review of Symptoms: No symptoms reported    HEENT HEENT Symptoms Reported: No symptoms reported      Cardiovascular Cardiovascular Symptoms Reported: Fatigue, Palpitations Cardiovascular Management Strategies: Coping strategies, Routine screening  Respiratory Respiratory Symptoms Reported: No symptoms reported    Endocrine Endocrine Symptoms Reported: No symptoms reported Is patient diabetic?: Yes    Gastrointestinal Gastrointestinal Symptoms Reported: No symptoms reported      Genitourinary Genitourinary Symptoms Reported: No symptoms reported    Integumentary Integumentary Symptoms Reported: No symptoms reported    Musculoskeletal Musculoskelatal Symptoms Reviewed: No symptoms reported        Psychosocial Psychosocial Symptoms Reported: Anxiety - if selected complete GAD Additional  Psychological Details: Pt reports management of chronic health conditions i.e AFib triggers anxiety/stress symptoms Behavioral Management Strategies: Adequate rest, Coping strategies, Medication therapy, Support system Major Change/Loss/Stressor/Fears (CP): Medical condition, self Techniques to Cope with Loss/Stress/Change: Diversional activities, Medication Quality of Family Relationships: involved, supportive Do you feel physically threatened by others?: No    09/08/2023    PHQ2-9 Depression Screening   Little interest or pleasure in doing things    Feeling down, depressed, or hopeless    PHQ-2 - Total Score    Trouble falling or staying asleep, or sleeping too much    Feeling tired or having little energy    Poor appetite or overeating     Feeling bad about yourself - or that you are a failure or have let yourself or your family down    Trouble concentrating on things, such as reading the newspaper or watching television    Moving or speaking so slowly that other people could have noticed.  Or the opposite - being so fidgety or restless that you have been moving around a lot more than usual    Thoughts that you would be better off dead, or hurting yourself in some way    PHQ2-9 Total Score    If you checked off any problems, how difficult have these problems made it for you to do your work, take care of things at home, or get along with other people    Depression Interventions/Treatment      There were no vitals filed for this visit.  Medications Reviewed Today     Reviewed by Ezzard Rolin BIRCH, LCSW (Social Worker) on 09/08/23 at 1042  Med List Status: <None>   Medication Order Taking? Sig Documenting Provider Last  Dose Status Informant  ACCU-CHEK GUIDE test strip [id number] Yes USE 1 TEST STRIP 1 TO 2 TIMES  DAILY Tysinger, Alm RAMAN, PA-C  Active   Accu-Chek Softclix Lancets lancets [id number] Yes Use as instructed Bulah Alm RAMAN, PA-C  Active   ALPRAZolam  (XANAX ) 0.25 MG tablet  [id number] Yes Take 1 tablet (0.25 mg total) by mouth 2 (two) times daily as needed for anxiety. Bulah Alm RAMAN, PA-C  Active   Artificial Tears ophthalmic solution [id number] Yes Place 1 drop into both eyes 3 (three) times daily as needed (for dryness). [provider]  Active Self  BENADRYL  ALLERGY 25 MG tablet [id number] Yes Take 25 mg by mouth every 6 (six) hours as needed for itching. [provider]  Active Self  Blood Glucose Monitoring Suppl (ACCU-CHEK GUIDE ME) w/Device KIT [id number] Yes Test 1-2 times daily Bulah Alm RAMAN DEVONNA  Active   diltiazem  (CARDIZEM ) 30 MG tablet [id number] Yes TAKE 1 TABLET BY MOUTH EVERY 4 HOURS AS NEEDED FOR AFIB HEART RATE >100 AS LONG AS TOP BP >100 Fenton, Clint R, PA  Active Self  diltiazem  (CARTIA  XT) 300 MG 24 hr capsule [id number] Yes TAKE 1 CAPSULE BY MOUTH DAILY Mealor, Augustus E, MD  Active   DULoxetine  (CYMBALTA ) 60 MG capsule [id number] Yes TAKE 1 CAPSULE BY MOUTH TWICE  DAILY Bulah Alm RAMAN, PA-C  Active   ELIQUIS  5 MG TABS tablet [id number] Yes TAKE 1 TABLET BY MOUTH TWICE  DAILY Tysinger, Alm RAMAN, PA-C  Active   ezetimibe  (ZETIA ) 10 MG tablet [id number] Yes TAKE 1 TABLET BY MOUTH DAILY Tysinger, Alm RAMAN, PA-C  Active   furosemide  (LASIX ) 20 MG tablet [id number] Yes TAKE 1 TABLET BY MOUTH EVERY DAY Bulah Alm RAMAN, PA-C  Active   levothyroxine  (SYNTHROID ) 75 MCG tablet [id number] Yes TAKE 1 TABLET BY MOUTH DAILY  BEFORE BREAKFAST Tysinger, David S, PA-C  Active   MULTAQ  400 MG tablet [id number] Yes TAKE 1 TABLET BY MOUTH TWICE  DAILY WITH MEALS Crenshaw, Redell RAMAN, MD  Active   nitroGLYCERIN  (NITROSTAT ) 0.4 MG SL tablet [id number] Yes DISSOLVE 1 TABLET UNDER THE  TONGUE EVERY 5 MINUTES AS NEEDED FOR CHEST PAIN. MAX OF 3 TABLETS IN 15 MINUTES. CALL 911 IF PAIN  PERSISTS. Pietro Redell RAMAN, MD  Active Self  omeprazole  (PRILOSEC) 40 MG capsule [id number] Yes Take 40 mg by mouth daily. [provider]  Active   oxybutynin   (DITROPAN -XL) 10 MG 24 hr tablet [id number] Yes TAKE 1 TABLET BY MOUTH TWICE  DAILY Tysinger, Alm RAMAN, PA-C  Active   tirzepatide  (MOUNJARO ) 7.5 MG/0.5ML Pen [id number] Yes Inject 7.5 mg into the skin once a week. Tysinger, Alm RAMAN, PA-C  Active   TYLENOL  500 MG tablet [id number] Yes Take 500-1,000 mg by mouth every 6 (six) hours as needed for mild pain or headache. [provider]  Active Self            Recommendation:   Continue Current Plan of Care  Follow Up Plan:   Telephone follow-up in 1 month  Rolin Kerns, LCSW [facility]  [facility], [facility] Clinical Social Worker Direct Dial: [phone]  Fax: [phone] Website: [email or web address] 10:49 AM

## 2023-09-13 ENCOUNTER — Other Ambulatory Visit: Payer: Self-pay | Admitting: Medical

## 2023-09-13 NOTE — Progress Notes (Signed)
 Remote Loop Recorder Transmission

## 2023-09-18 ENCOUNTER — Other Ambulatory Visit: Payer: Self-pay | Admitting: Medical

## 2023-09-19 ENCOUNTER — Ambulatory Visit: Payer: Medicare Other

## 2023-09-26 ENCOUNTER — Ambulatory Visit: Payer: Medicare Other

## 2023-09-29 DIAGNOSIS — M48062 Spinal stenosis, lumbar region with neurogenic claudication: Secondary | ICD-10-CM | POA: Diagnosis not present

## 2023-10-04 ENCOUNTER — Telehealth: Payer: Self-pay | Admitting: *Deleted

## 2023-10-04 ENCOUNTER — Other Ambulatory Visit: Payer: Self-pay | Admitting: Medical

## 2023-10-04 NOTE — Telephone Encounter (Signed)
 Copied from CRM #[id number]. Topic: Clinical - Medical Advice >> Oct 04, 2023 12:52 PM Lauren C wrote: Reason for CRM: Pt calling in crying, stating her afib has been bad with anxiety and does not know what to do about it. Does not want to take xanax  because it puts her to sleep for 2 days. Her daughter told her she should try buspirone because it worked well for her. She says after taking her meds, such as cymbalta , she actually feels worse. She would not like to speak with triage stating they might tell her to go to the hospital and she can't afford it, she already owes them a balance and is working with a debt company. She also mentioned possibly working with someone in behavioral health. Please see if Tysinger would be willing to try busiprone with patient and perhaps a behavioral health referral and speak with her about afib management.  Sending to [place].

## 2023-10-04 NOTE — Progress Notes (Signed)
 Remote Loop Recorder Transmission

## 2023-10-05 NOTE — Progress Notes (Signed)
 Remote Loop Recorder Transmission

## 2023-10-06 ENCOUNTER — Encounter: Payer: Self-pay | Admitting: Medical

## 2023-10-06 ENCOUNTER — Ambulatory Visit

## 2023-10-06 ENCOUNTER — Ambulatory Visit (INDEPENDENT_AMBULATORY_CARE_PROVIDER_SITE_OTHER): Admitting: Medical

## 2023-10-06 VITALS — BP 140/70 | HR 80 | Ht 61.0 in | Wt 124.8 lb

## 2023-10-06 DIAGNOSIS — I4819 Other persistent atrial fibrillation: Secondary | ICD-10-CM

## 2023-10-06 DIAGNOSIS — F419 Anxiety disorder, unspecified: Secondary | ICD-10-CM | POA: Diagnosis not present

## 2023-10-06 DIAGNOSIS — E039 Hypothyroidism, unspecified: Secondary | ICD-10-CM

## 2023-10-06 DIAGNOSIS — I5032 Chronic diastolic (congestive) heart failure: Secondary | ICD-10-CM | POA: Diagnosis not present

## 2023-10-06 DIAGNOSIS — E1159 Type 2 diabetes mellitus with other circulatory complications: Secondary | ICD-10-CM

## 2023-10-06 DIAGNOSIS — I152 Hypertension secondary to endocrine disorders: Secondary | ICD-10-CM

## 2023-10-06 DIAGNOSIS — Z8673 Personal history of transient ischemic attack (TIA), and cerebral infarction without residual deficits: Secondary | ICD-10-CM | POA: Diagnosis not present

## 2023-10-06 DIAGNOSIS — Z79899 Other long term (current) drug therapy: Secondary | ICD-10-CM

## 2023-10-06 DIAGNOSIS — E1169 Type 2 diabetes mellitus with other specified complication: Secondary | ICD-10-CM

## 2023-10-06 DIAGNOSIS — M549 Dorsalgia, unspecified: Secondary | ICD-10-CM

## 2023-10-06 DIAGNOSIS — G8929 Other chronic pain: Secondary | ICD-10-CM | POA: Diagnosis not present

## 2023-10-06 DIAGNOSIS — D6869 Other thrombophilia: Secondary | ICD-10-CM

## 2023-10-06 LAB — CUP PACEART REMOTE DEVICE CHECK
Date Time Interrogation Session: 20251001232129
Implantable Pulse Generator Implant Date: 20231103

## 2023-10-06 MED ORDER — BUSPIRONE HCL 5 MG PO TABS: 2.5000 mg | ORAL_TABLET | Freq: Two times a day (BID) | ORAL | 0 refills | Status: DC

## 2023-10-06 NOTE — Progress Notes (Signed)
 Subjective:  Sandra Ruiz is a 78 y.o. female who presents for Chief Complaint  Patient presents with   Consult    Having issues with the Afib clinic, understanding things. She is having anxiety and panic attacks. Asking for rx for buspar.      Here with husband today.  She is concerned about her A-fib situation.  She is seeing electrophysiology, cardiology and A-fib clinic.  She also recently had a second opinion visit with [facility]ology  Her concern is that she feels like she is getting various conflicting information between A-fib clinic and electrophysiology.  She notes that when she talks with electrophysiology they were concerned about her A-fib persistence.  However she notes that when she has checked in with the A-fib clinic they will say she is not in A-fib.  This is confusing to her.  She is having symptoms including palpitations, feeling like her heart stops at times.  She is scared about her heart.  She is compliant with her medications including diltiazem , Multaq , and Eliquis  anticoagulation.  She feels shaky at times.  She feels anxious.  She does not want to use Xanax  as every time she takes it it makes her so loopy or groggy that the effect stays longer in her system.  Her daughter uses BuSpar with good results and is curious about trying this.  She is on Cymbalta  for chronic pain and mood but that does not seem to be helping either issue  She continues on Mounjaro  7.5 mg and does not want to change this but is not checking sugars either  She continues on levothyroxine  75 mcg daily  No other aggravating or relieving factors.    No other c/o.  Past Medical History:  Diagnosis Date   Chronic pain    Depression    Diabetes mellitus without complication (HCC)    GERD (gastroesophageal reflux disease)    History of TIA (transient ischemic attack)    Hyperlipidemia    Hyperopia of both eyes with astigmatism and presbyopia 12/21/2019   Hypertension    Lyme disease     Paroxysmal atrial fibrillation (HCC)    Sleep apnea    Thyroid  disease    Current Outpatient Medications on File Prior to Visit  Medication Sig Dispense Refill   ACCU-CHEK GUIDE TEST test strip USE 1 TEST STRIP 1 TO 2 TIMES  DAILY 200 strip 1   Accu-Chek Softclix Lancets lancets Use as instructed 90 each 3   Artificial Tears ophthalmic solution Place 1 drop into both eyes 3 (three) times daily as needed (for dryness).     BENADRYL  ALLERGY 25 MG tablet Take 25 mg by mouth every 6 (six) hours as needed for itching.     Blood Glucose Monitoring Suppl (ACCU-CHEK GUIDE ME) w/Device KIT Test 1-2 times daily 1 kit 0   diltiazem  (CARDIZEM ) 30 MG tablet TAKE 1 TABLET BY MOUTH EVERY 4 HOURS AS NEEDED FOR AFIB HEART RATE >100 AS LONG AS TOP BP >100 90 tablet 1   diltiazem  (CARTIA  XT) 300 MG 24 hr capsule TAKE 1 CAPSULE BY MOUTH DAILY 90 capsule 3   DULoxetine  (CYMBALTA ) 60 MG capsule TAKE 1 CAPSULE BY MOUTH TWICE  DAILY 200 capsule 2   ELIQUIS  5 MG TABS tablet TAKE 1 TABLET BY MOUTH TWICE  DAILY 200 tablet 2   ezetimibe  (ZETIA ) 10 MG tablet TAKE 1 TABLET BY MOUTH DAILY 100 tablet 2   furosemide  (LASIX ) 20 MG tablet TAKE 1 TABLET BY MOUTH EVERY  DAY 90 tablet 1   levothyroxine  (SYNTHROID ) 75 MCG tablet TAKE 1 TABLET BY MOUTH DAILY  BEFORE BREAKFAST 100 tablet 2   MULTAQ  400 MG tablet TAKE 1 TABLET BY MOUTH TWICE  DAILY WITH MEALS 200 tablet 2   omeprazole  (PRILOSEC) 40 MG capsule Take 40 mg by mouth daily.     oxybutynin  (DITROPAN -XL) 10 MG 24 hr tablet TAKE 1 TABLET BY MOUTH TWICE  DAILY 200 tablet 1   tirzepatide  (MOUNJARO ) 7.5 MG/0.5ML Pen INJECT THE CONTENTS OF ONE PEN  SUBCUTANEOUSLY WEEKLY AS  DIRECTED 6 mL 1   TYLENOL  500 MG tablet Take 500-1,000 mg by mouth every 6 (six) hours as needed for mild pain or headache.     nitroGLYCERIN  (NITROSTAT ) 0.4 MG SL tablet DISSOLVE 1 TABLET UNDER THE  TONGUE EVERY 5 MINUTES AS NEEDED FOR CHEST PAIN. MAX OF 3 TABLETS IN 15 MINUTES. CALL 911 IF PAIN  PERSISTS.  (Patient not taking: Reported on 10/06/2023) 25 tablet 3   No current facility-administered medications on file prior to visit.     The following portions of the patient's history were reviewed and updated as appropriate: allergies, current medications, past family history, past medical history, past social history, past surgical history and problem list.  ROS Otherwise as in subjective above  Objective: BP (!) 140/70   Pulse 80   Ht 5' 1 (1.549 m)   Wt 124 lb 12.8 oz (56.6 kg)   BMI 23.58 kg/m   General appearance: somewhat anxious Neck: supple, no lymphadenopathy, no thyromegaly, no masses, no JVD Heart: Irregular beats at times, otherwise RRR, normal S1, S2, no murmurs Lungs: CTA bilaterally, no wheezes, rhonchi, or rales Ext: no edema Pulses within normal limits    Assessment: Encounter Diagnoses  Name Primary?   Persistent atrial fibrillation (HCC) Yes   Anxiety    Hypothyroidism, unspecified type    Chronic diastolic CHF (congestive heart failure) (HCC)    Hypercoagulable state due to persistent atrial fibrillation (HCC)    History of TIA (transient ischemic attack)    High risk medication use    Fatty liver    Type 2 diabetes mellitus with other specified complication, without long-term current use of insulin  (HCC)    Hypertension associated with diabetes (HCC)    Chronic bilateral back pain, unspecified back location      Plan: We discussed her concerns and possible causes of her anxiety/uneasiness  We reviewed recent labs showing that she had normal electrolytes and normal blood counts.  I will update her thyroid  labs today.  Continue levothyroxine  75 mcg daily  She unfortunate is not checking her blood sugars.  I recommend she start checking her blood sugars regularly to make sure she is not having hypoglycemic episodes.  I recommend we go ahead and drop down to 5 mg Zepbound  but she is not too excited about this.  She wants to stay at the 7.5 mg dose due  to stress eating and cravings.  Advise she get me blood sugar readings within the next 2 weeks.  I strongly recommend we lower the dose of Zepbound   She feels like the Cymbalta  is not really helping and she is currently on 60 mg Cymbalta  twice daily.  I will have her go to Cymbalta  60 mg once daily for little while and see if she remains unchanged or if she has other response.  Anxiety-encouraged her to follow-up with cardiology, try to work on stress reduction where possible.  Begin trial of low-dose BuSpar  2.5 mg twice daily.  She will need to cut her 5 mg tablets in half.  This is a new medication for her.  Discontinue  Xanax   Continue the rest of your medications as usual   Sandra Ruiz was seen today for consult.  Diagnoses and all orders for this visit:  Persistent atrial fibrillation (HCC)  Anxiety  Hypothyroidism, unspecified type -     TSH + free T4  Chronic diastolic CHF (congestive heart failure) (HCC)  Hypercoagulable state due to persistent atrial fibrillation (HCC)  History of TIA (transient ischemic attack)  High risk medication use  Fatty liver  Type 2 diabetes mellitus with other specified complication, without long-term current use of insulin  (HCC)  Hypertension associated with diabetes (HCC)  Chronic bilateral back pain, unspecified back location  Other orders -     busPIRone (BUSPAR) 5 MG tablet; Take 0.5 tablets (2.5 mg total) by mouth 2 (two) times daily.    Follow up: call back in 1-2 weeks

## 2023-10-07 LAB — TSH+FREE T4
Free T4: 1.11 ng/dL (ref 0.82–1.77)
TSH: 3.65 u[IU]/mL (ref 0.450–4.500)

## 2023-10-08 ENCOUNTER — Ambulatory Visit: Payer: Self-pay | Admitting: Medical

## 2023-10-08 NOTE — Progress Notes (Signed)
 Results thru my chart

## 2023-10-10 NOTE — Progress Notes (Signed)
 Remote Loop Recorder Transmission

## 2023-10-12 ENCOUNTER — Ambulatory Visit: Payer: Medicare Other | Admitting: Internal Medicine

## 2023-10-13 ENCOUNTER — Other Ambulatory Visit: Payer: Self-pay | Admitting: Licensed Clinical Social Worker

## 2023-10-13 DIAGNOSIS — E1159 Type 2 diabetes mellitus with other circulatory complications: Secondary | ICD-10-CM

## 2023-10-17 ENCOUNTER — Ambulatory Visit: Payer: Self-pay | Admitting: Cardiovascular Disease

## 2023-10-18 NOTE — Patient Instructions (Signed)
 Visit Information  Thank you for taking time to visit with me today. Please don't hesitate to contact me if I can be of assistance to you before our next scheduled appointment.  Your next care management appointment is by telephone on 11/6 at 9:30 AM  Please call the care guide team at [phone] if you need to cancel, schedule, or reschedule an appointment.   Please call the Suicide and Crisis Lifeline: 988 go to [facility] [address] [place] [phone]) call 911 if you are experiencing a Mental Health or Behavioral Health Crisis or need someone to talk to.  Rolin Kerns, LCSW [place]  [facility], [facility] Clinical Social Worker Direct Dial: [phone]  Fax: [phone] Website: [email or web address] 10:57 AM

## 2023-10-21 ENCOUNTER — Telehealth: Payer: Self-pay

## 2023-10-21 NOTE — Progress Notes (Signed)
 Complex Care Management Note Care Guide Note  10/21/2023 Name: [name] MRN: [id number] DOB: 30-Aug-1945  Sandra Ruiz is a 78 y.o. year old female who is a primary care patient of Bulah Alm GORMAN DEVONNA . The community resource team was consulted for assistance with Food Insecurity and pet assistance.  SDOH screenings and interventions completed:  Yes  Social Drivers of Health From This Encounter   Food Insecurity: Food Insecurity Present (10/21/2023)   Hunger Vital Sign    Worried About Running Out of Food in the Last Year: Often true    Ran Out of Food in the Last Year: Often true  Housing: Low Risk  (10/21/2023)   Housing Stability Vital Sign    Unable to Pay for Housing in the Last Year: No    Number of Times Moved in the Last Year: 0    Homeless in the Last Year: No  Financial Resource Strain: High Risk (10/21/2023)   Overall Financial Resource Strain (CARDIA)    Difficulty of Paying Living Expenses: Very hard  Transportation Needs: No Transportation Needs (10/21/2023)   PRAPARE - Administrator, Civil Service (Medical): No    Lack of Transportation (Non-Medical): No  Utilities: Not At Risk (10/21/2023)   Utilities    Threatened with loss of utilities: No    SDOH Interventions Today    Flowsheet Row Most Recent Value  SDOH Interventions   Food Insecurity Interventions Other (Comment)  [Mailed Simplified SNAP application for seniors, Passenger transport manager food bank information patient is currently a client.]     Care guide performed the following interventions: Patient provided with information about care guide support team and interviewed to confirm resource needs.  Follow Up Plan:  Client will call if she does not received mailed resources.  Encounter Outcome:  Patient Visit Completed  Kyro [facility]  [facility] Guide Direct Dial: [phone]  Fax: [phone] Website: [email or web address]

## 2023-10-24 ENCOUNTER — Ambulatory Visit: Payer: Medicare Other

## 2023-10-25 ENCOUNTER — Ambulatory Visit: Payer: Medicare Other

## 2023-10-25 VITALS — Ht 61.0 in | Wt 124.0 lb

## 2023-10-25 DIAGNOSIS — Z0000 Encounter for general adult medical examination without abnormal findings: Secondary | ICD-10-CM | POA: Diagnosis not present

## 2023-10-25 NOTE — Patient Instructions (Signed)
 Sandra Ruiz,  Thank you for taking the time for your Medicare Wellness Visit. I appreciate your continued commitment to your health goals. Please review the care plan we discussed, and feel free to reach out if I can assist you further.  Medicare recommends these wellness visits once per year to help you and your care team stay ahead of potential health issues. These visits are designed to focus on prevention, allowing your provider to concentrate on managing your acute and chronic conditions during your regular appointments.  Please note that Annual Wellness Visits do not include a physical exam. Some assessments may be limited, especially if the visit was conducted virtually. If needed, we may recommend a separate in-person follow-up with your provider.  Ongoing Care Seeing your primary care provider every 3 to 6 months helps us  monitor your health and provide consistent, personalized care.   Referrals If a referral was made during today's visit and you haven't received any updates within two weeks, please contact the referred provider directly to check on the status.  Recommended Screenings:  Health Maintenance  Topic Date Due   Zoster (Shingles) Vaccine (1 of 2) Never done   Yearly kidney health urinalysis for diabetes  08/04/2023   DTaP/Tdap/Td vaccine (1 - Tdap) 02/15/2024*   Flu Shot  04/03/2024*   Hemoglobin A1C  11/03/2023   Eye exam for diabetics  11/17/2023   Complete foot exam   05/03/2024   Yearly kidney function blood test for diabetes  08/30/2024   Medicare Annual Wellness Visit  10/24/2024   DEXA scan (bone density measurement)  Completed   Hepatitis C Screening  Completed   Meningitis B Vaccine  Aged Out   Pneumococcal Vaccine for age over 65  Discontinued   Breast Cancer Screening  Discontinued   Colon Cancer Screening  Discontinued   COVID-19 Vaccine  Discontinued  *Topic was postponed. The date shown is not the original due date.       10/25/2023    8:43 AM   Advanced Directives  Does Patient Have a Medical Advance Directive? Yes  Type of Estate agent of [place];Living will  Copy of Healthcare Power of Attorney in Chart? Yes - validated most recent copy scanned in chart (See row information)   Advance Care Planning is important because it: Ensures you receive medical care that aligns with your values, goals, and preferences. Provides guidance to your family and loved ones, reducing the emotional burden of decision-making during critical moments.  Vision: Annual vision screenings are recommended for early detection of glaucoma, cataracts, and diabetic retinopathy. These exams can also reveal signs of chronic conditions such as diabetes and high blood pressure.  Dental: Annual dental screenings help detect early signs of oral cancer, gum disease, and other conditions linked to overall health, including heart disease and diabetes.  Please see the attached documents for additional preventive care recommendations.

## 2023-10-25 NOTE — Progress Notes (Signed)
 Subjective:   Sandra Ruiz is a 78 y.o. who presents for a Medicare Wellness preventive visit.  As a reminder, Annual Wellness Visits don't include a physical exam, and some assessments may be limited, especially if this visit is performed virtually. We may recommend an in-person follow-up visit with your provider if needed.  Visit Complete: Virtual I connected with  Norvel JAYSON Pleva on 10/25/23 by a video and audio enabled telemedicine application and verified that I am speaking with the correct person using two identifiers.  Patient Location: Home  Provider Location: Office/Clinic  I discussed the limitations of evaluation and management by telemedicine. The patient expressed understanding and agreed to proceed.  Vital Signs: Because this visit was a virtual/telehealth visit, some criteria may be missing or patient reported. Any vitals not documented were not able to be obtained and vitals that have been documented are patient reported.    Persons Participating in Visit: Patient.  AWV Questionnaire: No: Patient Medicare AWV questionnaire was not completed prior to this visit.  Cardiac Risk Factors include: advanced age (>48men, >9 women);diabetes mellitus;dyslipidemia;hypertension     Objective:    Today's Vitals   10/25/23 0822 10/25/23 0823  Weight: 124 lb (56.2 kg)   Height: 5' 1 (1.549 m)   PainSc:  8    Body mass index is 23.43 kg/m.     10/25/2023    8:43 AM 03/30/2023    5:07 PM 10/12/2022    9:11 AM 09/23/2022    9:38 AM 08/09/2022    1:19 PM 08/02/2022    5:22 PM 07/23/2022   10:00 PM  Advanced Directives  Does Patient Have a Medical Advance Directive? Yes No Yes No No No No  Type of Estate agent of [place];Living will  Healthcare Power of Justice;Living will Living will Living will    Does patient want to make changes to medical advance directive?     No - Patient declined    Copy of Healthcare Power of Attorney in Chart? Yes -  validated most recent copy scanned in chart (See row information)  Yes - validated most recent copy scanned in chart (See row information)      Would patient like information on creating a medical advance directive?  No - Patient declined  No - Patient declined No - Patient declined  No - Patient declined    Current Medications (verified) Outpatient Encounter Medications as of 10/25/2023  Medication Sig   ACCU-CHEK GUIDE TEST test strip USE 1 TEST STRIP 1 TO 2 TIMES  DAILY   Accu-Chek Softclix Lancets lancets Use as instructed   Artificial Tears ophthalmic solution Place 1 drop into both eyes 3 (three) times daily as needed (for dryness).   BENADRYL  ALLERGY 25 MG tablet Take 25 mg by mouth every 6 (six) hours as needed for itching.   Blood Glucose Monitoring Suppl (ACCU-CHEK GUIDE ME) w/Device KIT Test 1-2 times daily   busPIRone (BUSPAR) 5 MG tablet Take 0.5 tablets (2.5 mg total) by mouth 2 (two) times daily.   diltiazem  (CARDIZEM ) 30 MG tablet TAKE 1 TABLET BY MOUTH EVERY 4 HOURS AS NEEDED FOR AFIB HEART RATE >100 AS LONG AS TOP BP >100   diltiazem  (CARTIA  XT) 300 MG 24 hr capsule TAKE 1 CAPSULE BY MOUTH DAILY   DULoxetine  (CYMBALTA ) 60 MG capsule TAKE 1 CAPSULE BY MOUTH TWICE  DAILY   ELIQUIS  5 MG TABS tablet TAKE 1 TABLET BY MOUTH TWICE  DAILY   ezetimibe  (ZETIA ) 10  MG tablet TAKE 1 TABLET BY MOUTH DAILY   furosemide  (LASIX ) 20 MG tablet TAKE 1 TABLET BY MOUTH EVERY DAY   levothyroxine  (SYNTHROID ) 75 MCG tablet TAKE 1 TABLET BY MOUTH DAILY  BEFORE BREAKFAST   MULTAQ  400 MG tablet TAKE 1 TABLET BY MOUTH TWICE  DAILY WITH MEALS   nitroGLYCERIN  (NITROSTAT ) 0.4 MG SL tablet DISSOLVE 1 TABLET UNDER THE  TONGUE EVERY 5 MINUTES AS NEEDED FOR CHEST PAIN. MAX OF 3 TABLETS IN 15 MINUTES. CALL 911 IF PAIN  PERSISTS.   omeprazole  (PRILOSEC) 40 MG capsule Take 40 mg by mouth daily.   oxybutynin  (DITROPAN -XL) 10 MG 24 hr tablet TAKE 1 TABLET BY MOUTH TWICE  DAILY   tirzepatide  (MOUNJARO ) 7.5 MG/0.5ML  Pen INJECT THE CONTENTS OF ONE PEN  SUBCUTANEOUSLY WEEKLY AS  DIRECTED   TYLENOL  500 MG tablet Take 500-1,000 mg by mouth every 6 (six) hours as needed for mild pain or headache.   No facility-administered encounter medications on file as of 10/25/2023.    Allergies (verified) Atorvastatin, Baclofen, Crestor [rosuvastatin], Metformin  and related, Verapamil, Silicone, and Wellbutrin  [bupropion ]   History: Past Medical History:  Diagnosis Date   Chronic pain    Depression    Diabetes mellitus without complication (HCC)    GERD (gastroesophageal reflux disease)    History of TIA (transient ischemic attack)    Hyperlipidemia    Hyperopia of both eyes with astigmatism and presbyopia 12/21/2019   Hypertension    Lyme disease    Paroxysmal atrial fibrillation (HCC)    Sleep apnea    Thyroid  disease    Past Surgical History:  Procedure Laterality Date   ATRIAL FIBRILLATION ABLATION     x2 in [place]   CARDIAC CATHETERIZATION     CARDIOVERSION N/A 03/05/2021   Procedure: CARDIOVERSION;  Surgeon: Hobart Powell BRAVO, MD;  Location: [facility] ENDOSCOPY;  Service: Cardiovascular;  Laterality: N/A;   CERVICAL SPINE SURGERY     GIVENS CAPSULE STUDY N/A 07/25/2022   Procedure: GIVENS CAPSULE STUDY;  Surgeon: Kriss Estefana DEL, DO;  Location: [facility] ENDOSCOPY;  Service: Gastroenterology;  Laterality: N/A;   implantable loop recorder placement  11/07/2017   MDT LINQ1 implanted in [place] for afib management by Dr Dennise   LOOP RECORDER INSERTION N/A 11/06/2021   Procedure: LOOP RECORDER INSERTION;  Surgeon: Nancey Eulas BRAVO, MD;  Location: [facility] INVASIVE CV LAB;  Service: Cardiovascular;  Laterality: N/A;   LOOP RECORDER REMOVAL N/A 11/06/2021   Procedure: LOOP RECORDER REMOVAL;  Surgeon: Nancey Eulas BRAVO, MD;  Location: [facility] INVASIVE CV LAB;  Service: Cardiovascular;  Laterality: N/A;   LUMBAR SPINE SURGERY     TONSILLECTOMY AND ADENOIDECTOMY     Family History  Problem Relation Age of Onset   CAD Mother     Thyroid  disease Mother    CAD Father    Thyroid  disease Daughter    Hypothyroidism Daughter    Social History   Socioeconomic History   Marital status: Married    Spouse name: Not on file   Number of children: 5   Years of education: Not on file   Highest education level: Some college, no degree  Occupational History   Not on file  Tobacco Use   Smoking status: Former   Smokeless tobacco: Never   Tobacco comments:    Former smoker 11/12/2020  Vaping Use   Vaping status: Never Used  Substance and Sexual Activity   Alcohol  use: Not Currently    Alcohol /week: 2.0 standard drinks of alcohol   Types: 2 Standard drinks or equivalent per week    Comment: mix drinks occ.   Drug use: Never   Sexual activity: Not on file  Other Topics Concern   Not on file  Social History Narrative   From [place], married, [place], new from [place]  to [place]  07/2019   Right handed   Some caffeine use    Lives with husband, daughter, and son-in-law   Social Drivers of Health   Financial Resource Strain: High Risk (10/25/2023)   Overall Financial Resource Strain (CARDIA)    Difficulty of Paying Living Expenses: Very hard  Food Insecurity: Food Insecurity Present (10/25/2023)   Hunger Vital Sign    Worried About Running Out of Food in the Last Year: Often true    Ran Out of Food in the Last Year: Often true  Transportation Needs: No Transportation Needs (10/25/2023)   PRAPARE - Administrator, Civil Service (Medical): No    Lack of Transportation (Non-Medical): No  Physical Activity: Inactive (10/25/2023)   Exercise Vital Sign    Days of Exercise per Week: 0 days    Minutes of Exercise per Session: 0 min  Stress: Stress Concern Present (10/25/2023)   [employer] of Occupational Health - Occupational Stress Questionnaire    Feeling of Stress: Rather much  Social Connections: Unknown (10/25/2023)   Social Connection and Isolation Panel     Frequency of Communication with Friends and Family: Not on file    Frequency of Social Gatherings with Friends and Family: Twice a week    Attends Religious Services: More than 4 times per year    Active Member of [employer] or Organizations: Yes    Attends Banker Meetings: 1 to 4 times per year    Marital Status: Married    Tobacco Counseling Counseling given: Not Answered Tobacco comments: Former smoker 11/12/2020    Clinical Intake:  Pre-visit preparation completed: Yes  Pain : 0-10 Pain Score: 8  Pain Type: Chronic pain Pain Location: Back Pain Orientation: Lower Pain Descriptors / Indicators: Aching Pain Onset: More than a month ago Pain Frequency: Constant     Nutritional Status: BMI of 19-24  Normal Nutritional Risks: Nausea/ vomitting/ diarrhea (nausea with anxiety) Diabetes: Yes CBG done?: No Did pt. bring in CBG monitor from home?: No  Lab Results  Component Value Date   HGBA1C 5.0 05/04/2023   HGBA1C 4.5 (L) 08/04/2022   HGBA1C 6.3 (A) 04/28/2022     How often do you need to have someone help you when you read instructions, pamphlets, or other written materials from your doctor or pharmacy?: 1 - Never  Interpreter Needed?: No  Information entered by :: NAllen LPN   Activities of Daily Living     10/25/2023    8:31 AM  In your present state of health, do you have any difficulty performing the following activities:  Hearing? 0  Vision? 1  Comment blurry sometimes  Difficulty concentrating or making decisions? 1  Walking or climbing stairs? 1  Comment back pain  Dressing or bathing? 0  Doing errands, shopping? 0  Preparing Food and eating ? N  Using the Toilet? N  In the past six months, have you accidently leaked urine? Y  Do you have problems with loss of bowel control? N  Managing your Medications? N  Managing your Finances? N  Housekeeping or managing your Housekeeping? N    Patient Care Team: Bulah Alm RAMAN, PA-C as  PCP  - General (Family Medicine) Renate Lynwood Hussar, MD as Referring Physician (Ophthalmology) Mealor, Eulas BRAVO, MD as Consulting Physician (Cardiology) Pietro Redell RAMAN, MD as Consulting Physician (Cardiology) Lionell, Jon DEL, [facility] (Pharmacist) Lewis, Jasmine D, LCSW as VBCI Care Management (Licensed Clinical Social Worker)  I have updated your Care Teams any recent Medical Services you may have received from other providers in the past year.     Assessment:   This is a routine wellness examination for [place].  Hearing/Vision screen Hearing Screening - Comments:: Denies hearing issues Vision Screening - Comments:: Regular eye exams, looking for new doctor   Goals Addressed             This Visit's Progress    Patient Stated       10/25/2023, wants to tone loose skin       Depression Screen     10/25/2023    8:46 AM 05/04/2023    8:42 AM 10/12/2022    9:14 AM 10/30/2021    3:11 PM 10/01/2021   11:29 AM 04/08/2021    3:36 PM 11/26/2020   10:34 AM  PHQ 2/9 Scores  PHQ - 2 Score 3 4 1  0 1 2 2   PHQ- 9 Score 10 19 5 2 2 13 13     Fall Risk     10/25/2023    8:45 AM 05/04/2023    8:42 AM 10/12/2022    9:13 AM 10/30/2021    3:08 PM 10/01/2021   11:29 AM  Fall Risk   Falls in the past year? 0 0 1 1 1   Comment   fell out of bed fell out of bed   Number falls in past yr: 0 0 0 0 0  Injury with Fall? 0 0 0 0 0  Risk for fall due to : Impaired balance/gait;Impaired mobility No Fall Risks Medication side effect;History of fall(s) Impaired balance/gait;Impaired mobility;Medication side effect Impaired balance/gait  Follow up Falls evaluation completed;Falls prevention discussed Falls evaluation completed Falls prevention discussed;Falls evaluation completed Falls evaluation completed;Education provided;Falls prevention discussed  Falls evaluation completed      Data saved with a previous flowsheet row definition    MEDICARE RISK AT HOME:  Medicare Risk at Home Any stairs  in or around the home?: No If so, are there any without handrails?: No Home free of loose throw rugs in walkways, pet beds, electrical cords, etc?: Yes Adequate lighting in your home to reduce risk of falls?: Yes Life alert?: No Use of a cane, walker or w/c?: Yes Grab bars in the bathroom?: Yes Shower chair or bench in shower?: Yes Elevated toilet seat or a handicapped toilet?: No  TIMED UP AND GO:  Was the test performed?  No  Cognitive Function: 6CIT completed    05/16/2023   12:28 PM  MMSE - Mini Mental State Exam  Orientation to time 5  Orientation to Place 4  Registration 3  Attention/ Calculation 5  Recall 3  Language- name 2 objects 2  Language- repeat 1  Language- follow 3 step command 3  Language- read & follow direction 1  Write a sentence 1  Copy design 1  Total score 29        10/25/2023    8:51 AM 10/12/2022    9:19 AM 10/30/2021    3:15 PM  6CIT Screen  What Year? 0 points 0 points 0 points  What month? 0 points 0 points 0 points  What time? 0 points 0 points 3 points  Count back from 20 0 points 0 points 0 points  Months in reverse 0 points 0 points 0 points  Repeat phrase 0 points 2 points 2 points  Total Score 0 points 2 points 5 points    Immunizations Immunization History  Administered Date(s) Administered   Fluad Quad(high Dose 65+) 11/01/2019, 09/18/2020, 10/01/2021   Fluad Trivalent(High Dose 65+) 11/04/2022   Paulino (J&J) SARS-COV-2 Vaccination 03/18/2019   Moderna Sars-Covid-2 Vaccination 11/01/2019   Pfizer(Comirnaty)Fall Seasonal Vaccine 12 years and older 11/04/2022   Pneumococcal Conjugate-13 08/26/2020    Screening Tests Health Maintenance  Topic Date Due   Zoster Vaccines- Shingrix (1 of 2) Never done   Diabetic kidney evaluation - Urine ACR  08/04/2023   DTaP/Tdap/Td (1 - Tdap) 02/15/2024 (Originally 07/01/1964)   Influenza Vaccine  04/03/2024 (Originally 08/05/2023)   HEMOGLOBIN A1C  11/03/2023   OPHTHALMOLOGY EXAM   11/17/2023   FOOT EXAM  05/03/2024   Diabetic kidney evaluation - eGFR measurement  08/30/2024   Medicare Annual Wellness (AWV)  10/24/2024   DEXA SCAN  Completed   Hepatitis C Screening  Completed   Meningococcal B Vaccine  Aged Out   Pneumococcal Vaccine: 50+ Years  Discontinued   Mammogram  Discontinued   Colonoscopy  Discontinued   COVID-19 Vaccine  Discontinued    Health Maintenance Items Addressed: Vaccines Due: shingles  Additional Screening:  Vision Screening: Recommended annual ophthalmology exams for early detection of glaucoma and other disorders of the eye. Is the patient up to date with their annual eye exam?  Yes  Who is the provider or what is the name of the office in which the patient attends annual eye exams? Looking for new doctor  Dental Screening: Recommended annual dental exams for proper oral hygiene  Community Resource Referral / Chronic Care Management: CRR required this visit?  No   CCM required this visit?  No   Plan:    I have personally reviewed and noted the following in the patient's chart:   Medical and social history Use of alcohol , tobacco or illicit drugs  Current medications and supplements including opioid prescriptions. Patient is not currently taking opioid prescriptions. Functional ability and status Nutritional status Physical activity Advanced directives List of other physicians Hospitalizations, surgeries, and ER visits in previous 12 months Vitals Screenings to include cognitive, depression, and falls Referrals and appointments  In addition, I have reviewed and discussed with patient certain preventive protocols, quality metrics, and best practice recommendations. A written personalized care plan for preventive services as well as general preventive health recommendations were provided to patient.   Ardella FORBES Dawn, LPN   89/78/7974   After Visit Summary: (MyChart) Due to this being a telephonic visit, the after visit  summary with patients personalized plan was offered to patient via MyChart   Notes: Nothing significant to report at this time.

## 2023-10-27 ENCOUNTER — Telehealth: Payer: Self-pay | Admitting: Internal Medicine

## 2023-10-27 NOTE — Telephone Encounter (Signed)
 Copied from CRM [id number]. Topic: Clinical - Prescription Issue >> Oct 27, 2023  1:06 PM Gustabo D wrote: [name] told the pt her pcp needs to fill out a form so pt can get the device

## 2023-10-31 ENCOUNTER — Ambulatory Visit: Payer: Medicare Other

## 2023-10-31 NOTE — Progress Notes (Signed)
 Sandra Ruiz                                          MRN: [id number]   10/31/2023   The VBCI Quality Team Specialist reviewed this patient medical record for the purposes of chart review for care gap closure. The following were reviewed: chart review for care gap closure-kidney health evaluation for diabetes:eGFR  and uACR.    VBCI Quality Team

## 2023-11-07 ENCOUNTER — Ambulatory Visit

## 2023-11-07 DIAGNOSIS — I4819 Other persistent atrial fibrillation: Secondary | ICD-10-CM

## 2023-11-07 LAB — CUP PACEART REMOTE DEVICE CHECK
Date Time Interrogation Session: 20251102232856
Implantable Pulse Generator Implant Date: 20231103

## 2023-11-09 ENCOUNTER — Encounter: Payer: Self-pay | Admitting: Medical

## 2023-11-10 ENCOUNTER — Other Ambulatory Visit: Payer: Self-pay | Admitting: Licensed Clinical Social Worker

## 2023-11-10 ENCOUNTER — Ambulatory Visit: Payer: Self-pay | Admitting: Cardiovascular Disease

## 2023-11-10 NOTE — Patient Instructions (Signed)
 Visit Information  Thank you for taking time to visit with me today. Please don't hesitate to contact me if I can be of assistance to you before our next scheduled appointment.  Your next care management appointment is by telephone on 12/4 at 9:30 AM  Please call the care guide team at [phone] if you need to cancel, schedule, or reschedule an appointment.   Please call the Suicide and Crisis Lifeline: 988 go to [facility] [address], [place] [phone]) call 911 if you are experiencing a Mental Health or Behavioral Health Crisis or need someone to talk to.  Rolin Kerns, LCSW [place]  [facility], [facility] Clinical Social Worker Direct Dial: [phone]  Fax: [phone] Website: [email or web address] 10:17 AM

## 2023-11-10 NOTE — Progress Notes (Signed)
 This was fax on 10/23 and I will refax this today again

## 2023-11-10 NOTE — Progress Notes (Signed)
Pt was notified this was done.

## 2023-11-10 NOTE — Patient Outreach (Signed)
 Complex Care Management   Visit Note  11/10/2023  Name:  Sandra Ruiz MRN: [id number] DOB: 07-24-1945  Situation: Referral received for Complex Care Management related to Mental/Behavioral Health diagnosis Anxiety I obtained verbal consent from Patient.  Visit completed with Patient  on the phone  Background:   Past Medical History:  Diagnosis Date   Chronic pain    Depression    Diabetes mellitus without complication (HCC)    GERD (gastroesophageal reflux disease)    History of TIA (transient ischemic attack)    Hyperlipidemia    Hyperopia of both eyes with astigmatism and presbyopia 12/21/2019   Hypertension    Lyme disease    Paroxysmal atrial fibrillation (HCC)    Sleep apnea    Thyroid  disease     Assessment: Patient Reported Symptoms:  Cognitive Cognitive Status: No symptoms reported, Alert and oriented to person, place, and time, Normal speech and language skills   Health Maintenance Behaviors: Annual physical exam  Neurological Neurological Review of Symptoms: No symptoms reported    HEENT HEENT Symptoms Reported: No symptoms reported      Cardiovascular Cardiovascular Symptoms Reported: Chest pain or discomfort Cardiovascular Management Strategies: Medication therapy, Routine screening, Coping strategies Cardiovascular Comment: Patient has an upcoming appt with Cardiology. Practices deep breathing and has medication to take, if needed  Respiratory Respiratory Symptoms Reported: No symptoms reported    Endocrine Endocrine Symptoms Reported: No symptoms reported Is patient diabetic?: Yes Endocrine Comment: Pt is awaiting form to be completed for [employer]. Message sent to PCP  Gastrointestinal Gastrointestinal Symptoms Reported: No symptoms reported      Genitourinary Genitourinary Symptoms Reported: No symptoms reported    Integumentary Integumentary Symptoms Reported: No symptoms reported    Musculoskeletal Musculoskelatal Symptoms Reviewed: No symptoms  reported        Psychosocial Psychosocial Symptoms Reported: Alteration in sleep habits Additional Psychological Details: Reports decrease in anxiety episodes/symptoms Behavioral Management Strategies: Adequate rest, Community resources, Coping strategies, Medication therapy, Support system Major Change/Loss/Stressor/Fears (CP): Medical condition, self Techniques to Cope with Loss/Stress/Change: Diversional activities, Medication      11/10/2023    PHQ2-9 Depression Screening   Little interest or pleasure in doing things    Feeling down, depressed, or hopeless    PHQ-2 - Total Score    Trouble falling or staying asleep, or sleeping too much    Feeling tired or having little energy    Poor appetite or overeating     Feeling bad about yourself - or that you are a failure or have let yourself or your family down    Trouble concentrating on things, such as reading the newspaper or watching television    Moving or speaking so slowly that other people could have noticed.  Or the opposite - being so fidgety or restless that you have been moving around a lot more than usual    Thoughts that you would be better off dead, or hurting yourself in some way    PHQ2-9 Total Score    If you checked off any problems, how difficult have these problems made it for you to do your work, take care of things at home, or get along with other people    Depression Interventions/Treatment      There were no vitals filed for this visit.  Medications Reviewed Today     Reviewed by Ezzard Rolin BIRCH, LCSW (Social Worker) on 11/10/23 at [phone]  Med List Status: <None>   Medication Order Taking? Sig Documenting Provider  Last Dose Status Informant  ACCU-CHEK GUIDE TEST test strip [id number]  USE 1 TEST STRIP 1 TO 2 TIMES  DAILY Tysinger, Alm RAMAN, PA-C  Active   Accu-Chek Softclix Lancets lancets [id number]  Use as instructed Tysinger, David S, PA-C  Active   Artificial Tears ophthalmic solution [id number]  Place 1  drop into both eyes 3 (three) times daily as needed (for dryness). [provider]  Active Self  BENADRYL  ALLERGY 25 MG tablet [id number]  Take 25 mg by mouth every 6 (six) hours as needed for itching. [provider]  Active Self  Blood Glucose Monitoring Suppl (ACCU-CHEK GUIDE ME) w/Device KIT [id number]  Test 1-2 times daily Tysinger, Alm RAMAN, PA-C  Active   busPIRone (BUSPAR) 5 MG tablet [id number]  Take 0.5 tablets (2.5 mg total) by mouth 2 (two) times daily. Bulah Alm RAMAN, PA-C  Active   diltiazem  (CARDIZEM ) 30 MG tablet [id number]  TAKE 1 TABLET BY MOUTH EVERY 4 HOURS AS NEEDED FOR AFIB HEART RATE >100 AS LONG AS TOP BP >100 Fenton, Clint R, PA  Active Self  diltiazem  (CARTIA  XT) 300 MG 24 hr capsule [id number]  TAKE 1 CAPSULE BY MOUTH DAILY Mealor, Augustus E, MD  Active   DULoxetine  (CYMBALTA ) 60 MG capsule [id number]  TAKE 1 CAPSULE BY MOUTH TWICE  DAILY Bulah Alm RAMAN, PA-C  Active   ELIQUIS  5 MG TABS tablet [id number]  TAKE 1 TABLET BY MOUTH TWICE  DAILY Tysinger, Alm RAMAN, PA-C  Active   ezetimibe  (ZETIA ) 10 MG tablet [id number]  TAKE 1 TABLET BY MOUTH DAILY Tysinger, Alm RAMAN, PA-C  Active   furosemide  (LASIX ) 20 MG tablet [id number]  TAKE 1 TABLET BY MOUTH EVERY DAY Tysinger, Alm RAMAN, PA-C  Active            Med Note TEODORA, LUCIENNE JULIANNA Schaumann Oct 06, 2023  3:36 PM) As needed  levothyroxine  (SYNTHROID ) 75 MCG tablet [id number]  TAKE 1 TABLET BY MOUTH DAILY  BEFORE BREAKFAST Tysinger, David S, PA-C  Active   MULTAQ  400 MG tablet [id number]  TAKE 1 TABLET BY MOUTH TWICE  DAILY WITH MEALS Crenshaw, Redell RAMAN, MD  Active   nitroGLYCERIN  (NITROSTAT ) 0.4 MG SL tablet [id number]  DISSOLVE 1 TABLET UNDER THE  TONGUE EVERY 5 MINUTES AS NEEDED FOR CHEST PAIN. MAX OF 3 TABLETS IN 15 MINUTES. CALL 911 IF PAIN  PERSISTS. Pietro Redell RAMAN, MD  Active Self  omeprazole  (PRILOSEC) 40 MG capsule [id number]  Take 40 mg by mouth daily. [provider]  Active   oxybutynin  (DITROPAN -XL) 10  MG 24 hr tablet [id number]  TAKE 1 TABLET BY MOUTH TWICE  DAILY Tysinger, Alm RAMAN, PA-C  Active   tirzepatide  (MOUNJARO ) 7.5 MG/0.5ML Pen [id number]  [facility] THE CONTENTS OF ONE PEN  SUBCUTANEOUSLY WEEKLY AS  DIRECTED Tysinger, Alm RAMAN, PA-C  Active            Med Note TEODORA LUCIENNE JULIANNA Schaumann Oct 06, 2023  3:36 PM) Emily on Wednesday  TYLENOL  500 MG tablet [id number]  Take 500-1,000 mg by mouth every 6 (six) hours as needed for mild pain or headache. [provider]  Active Self           Med Note TEODORA, LUCIENNE JULIANNA Schaumann Oct 06, 2023  3:36 PM) As needed            Recommendation:   Continue Current Plan of Care  Follow Up Plan:   Telephone follow-up  in 1 month  Rolin Kerns, LCSW [facility]  [facility], [facility] Clinical Social Worker Direct Dial: [phone]  Fax: [phone] Website: [email or web address] 10:17 AM

## 2023-11-11 ENCOUNTER — Ambulatory Visit: Admitting: Internal Medicine

## 2023-11-11 NOTE — Progress Notes (Signed)
 This was already completed and filled out by you. I refaxed this yesterday

## 2023-11-11 NOTE — Progress Notes (Signed)
 Remote Loop Recorder Transmission

## 2023-11-15 ENCOUNTER — Telehealth: Payer: Self-pay

## 2023-11-15 NOTE — Telephone Encounter (Signed)
   Pre-operative Risk Assessment    Patient Name: Sandra Ruiz  DOB: 10/19/1945 MRN: [id number]   Date of last office visit: 08/31/23  Dr. Nancey Date of next office visit: NA   Request for Surgical Clearance    Procedure:  Lumbar/Cervical Epidural  Date of Surgery:  Clearance TBD                                Surgeon:  Dr. Deatrice Manus Surgeon's Group or Practice Name:  [facility] Phone number:  [phone] Fax number:  [phone]   Type of Clearance Requested:   - Medical  - Pharmacy:  Hold Apixaban  (Eliquis ) Hold for 3 days, resume day after   Type of Anesthesia:  Clearance form states no anesthesia   Additional requests/questions:    Sandra Ruiz   11/15/2023, 3:39 PM

## 2023-11-18 ENCOUNTER — Ambulatory Visit: Attending: Cardiovascular Disease | Admitting: Cardiovascular Disease

## 2023-11-18 ENCOUNTER — Encounter: Payer: Self-pay | Admitting: Cardiovascular Disease

## 2023-11-18 VITALS — BP 132/76 | HR 81 | Ht 61.0 in | Wt 126.5 lb

## 2023-11-18 DIAGNOSIS — I471 Supraventricular tachycardia, unspecified: Secondary | ICD-10-CM | POA: Diagnosis not present

## 2023-11-18 DIAGNOSIS — I4819 Other persistent atrial fibrillation: Secondary | ICD-10-CM

## 2023-11-18 DIAGNOSIS — R55 Syncope and collapse: Secondary | ICD-10-CM | POA: Diagnosis not present

## 2023-11-18 NOTE — Progress Notes (Signed)
 Cardiology Office Note:    Date:  11/18/2023   ID:  Sandra Ruiz, DOB 1945-12-19, MRN [id number]  PCP:  Bulah Alm GORMAN DEVONNA   [facility] Providers Cardiologist:  None     Referring MD: Bulah Alm GORMAN, PA-C   Chief complaint: palpitations, lightheadedness  History of Present Illness:    Sandra Ruiz is a 78 y.o. female with a hx of sleep apnea hypertension, diabetes, obesity atrial fibrillation and has undergone ablation twice in [place] .   She was diagnosed with atrial fibrillation in 2019.  She had recurrence and had cardioversion was scheduled for February 2022, but she spontaneously converted back to sinus rhythm.  She has an implantable loop recorder that detected an episode in January 2023.  This episode was associated with fatigue and shortness of breath.  She underwent cardioversion in March 2023.  Since her last visit, she went to [facility] for second opinion regarding management of her atrial fibrillation. This does not seem to have much affected her thoughts on whether she would like to transition to Tikosyn.  She continues to have periodic episodes which she has difficulty describing -- she finds herself unable to walk and has to sit down for a few minutes. She attributes these to anxiety.  She was given a symptom indicator at her last visit.  There have been a few symptom episodes and these have shown PACs.  A-fib episodes are rare, burden less than 1%.  EKGs/Labs/Other Studies Reviewed:     CT Coronary 09/22/2020 1. 3 vessel coronary calcium which is 67 th percentile for age/sex 2.  Normal aortic root 3.4 cm 3.  CAD RADS 1 non obstructive CAD see description above 4.  No PFO noted  TTE 06/21/2019 EF 65-70%. Grade I diastolic dysfunction. Normal LA size  EKG:         Recent Labs: 02/15/2023: ALT 14 08/29/2023: Hemoglobin 13.4; Platelet Count 270 08/31/2023: BUN 9; Creatinine, Ser 0.76; Potassium 3.8; Sodium 139 10/06/2023: TSH 3.650       Physical Exam:    VS:  There were no vitals taken for this visit.    Wt Readings from Last 3 Encounters:  10/25/23 124 lb (56.2 kg)  10/06/23 124 lb 12.8 oz (56.6 kg)  08/31/23 126 lb 3.2 oz (57.2 kg)     GEN:  Well nourished, well developed in no acute distress CARDIAC: RRR, no murmurs, rubs, gallops RESPIRATORY:  Normal work of breathing MUSCULOSKELETAL: no edema    ASSESSMENT & PLAN:    Persistent atrial fibrillation: S/p ablation x2. Monitoring with loop recorder. AF burden 0.4% Rates are not always optimally controlled We discussed switching from Multaq  to dofetilide.  She would like to think about it.  She would likely have to make some medication changes and will need to discontinue taking Benadryl . -- given her burden has been very low, will hold off. Symptom episodes show PACs  Secondary hypercoagulable state: CHADS2Vasc is 3 for sex, age, and probable CAD. Continue eliquis  5mg  PO BID  Obstructive sleep apnea  Obesity She has lost a significant amount of weight  Coronary artery disease  CHFrEF          Medication Adjustments/Labs and Tests Ordered: Current medicines are reviewed at length with the patient today.  Concerns regarding medicines are outlined above.  No orders of the defined types were placed in this encounter.  No orders of the defined types were placed in this encounter.    Signed, Eulas BRAVO [place],  MD  11/18/2023 4:15 PM    [facility]

## 2023-11-18 NOTE — Patient Instructions (Signed)
 Medication Instructions:  Your physician recommends that you continue on your current medications as directed. Please refer to the Current Medication list given to you today.  *If you need a refill on your cardiac medications before your next appointment, please call your pharmacy*  Lab Work: None ordered.  If you have labs (blood work) drawn today and your tests are completely normal, you will receive your results only by: MyChart Message (if you have MyChart) OR A paper copy in the mail If you have any lab test that is abnormal or we need to change your treatment, we will call you to review the results.  Testing/Procedures: None ordered.   Follow-Up: At [facility], you and your health needs are our priority.  As part of our continuing mission to provide you with exceptional heart care, our providers are all part of one team.  This team includes your primary Cardiologist (physician) and Advanced Practice Providers or APPs (Physician Assistants and Nurse Practitioners) who all work together to provide you with the care you need, when you need it.  Your next appointment:   8mo with Dr Marko APP

## 2023-11-21 ENCOUNTER — Other Ambulatory Visit: Payer: Self-pay | Admitting: Medical

## 2023-11-21 NOTE — Telephone Encounter (Signed)
 Left message for pt to call me back

## 2023-11-21 NOTE — Telephone Encounter (Signed)
   Name: Sandra Ruiz  DOB: 05/26/1945  MRN: [id number]  Primary Cardiologist: None   Preoperative team, please contact this patient and set up a phone call appointment for further preoperative risk assessment. Please obtain consent and complete medication review. Thank you for your help.  I confirm that guidance regarding antiplatelet and oral anticoagulation therapy has been completed and, if necessary, noted below.  Patient has not had an Afib/aflutter ablation in the last 3 months, DCCV within the last 4 weeks or a watchman implanted in the last 45 days    Per office protocol, patient can hold Eliquis  for 3 days prior to procedure.   I also confirmed the patient resides in the state of [place] . As per [facility] Medical Board telemedicine laws, the patient must reside in the state in which the provider is licensed.   Sandra CHRISTELLA Beauvais, NP 11/21/2023, 3:52 PM [facility]

## 2023-11-21 NOTE — Telephone Encounter (Signed)
Left message to call back and schedule a tele pre op appt.  

## 2023-11-21 NOTE — Telephone Encounter (Signed)
 Patient with diagnosis of afib on Eliquis  for anticoagulation.    Procedure:  Lumbar/Cervical Epidural  Date of procedure: TBD   CHA2DS2-VASc Score = 9   This indicates a 12.2% annual risk of stroke. The patient's score is based upon: CHF History: 1 HTN History: 1 Diabetes History: 1 Stroke History: 2 (TIA) Vascular Disease History: 1 Age Score: 2 Gender Score: 1      CrCl 57 ml/min Platelet count 270  Patient has not had an Afib/aflutter ablation in the last 3 months, DCCV within the last 4 weeks or a watchman implanted in the last 45 days   Per office protocol, patient can hold Eliquis  for 3 days prior to procedure.    Patient previously cleared to hold Eliquis  3 days by Dr. Pietro  **This guidance is not considered finalized until pre-operative APP has relayed final recommendations.**

## 2023-11-22 NOTE — Telephone Encounter (Signed)
 2nd attempt to reach the pt to schedule tele preop appt.

## 2023-11-23 ENCOUNTER — Other Ambulatory Visit: Payer: Self-pay | Admitting: Medical

## 2023-11-23 ENCOUNTER — Telehealth: Payer: Self-pay | Admitting: Cardiovascular Disease

## 2023-11-23 ENCOUNTER — Telehealth (HOSPITAL_BASED_OUTPATIENT_CLINIC_OR_DEPARTMENT_OTHER): Payer: Self-pay | Admitting: *Deleted

## 2023-11-23 NOTE — Telephone Encounter (Signed)
 Pt has been tele 11/28/23. Med rec and consent are done.      Patient Consent for Virtual Visit        Sandra Ruiz has provided verbal consent on 11/23/2023 for a virtual visit (video or telephone).   CONSENT FOR VIRTUAL VISIT FOR:  Sandra Ruiz  By participating in this virtual visit I agree to the following:  I hereby voluntarily request, consent and authorize [facility] and its employed or contracted physicians, physician assistants, nurse practitioners or other licensed health care professionals (the Practitioner), to provide me with telemedicine health care services (the "Services) as deemed necessary by the treating Practitioner. I acknowledge and consent to receive the Services by the Practitioner via telemedicine. I understand that the telemedicine visit will involve communicating with the Practitioner through live audiovisual communication technology and the disclosure of certain medical information by electronic transmission. I acknowledge that I have been given the opportunity to request an in-person assessment or other available alternative prior to the telemedicine visit and am voluntarily participating in the telemedicine visit.  I understand that I have the right to withhold or withdraw my consent to the use of telemedicine in the course of my care at any time, without affecting my right to future care or treatment, and that the Practitioner or I may terminate the telemedicine visit at any time. I understand that I have the right to inspect all information obtained and/or recorded in the course of the telemedicine visit and may receive copies of available information for a reasonable fee.  I understand that some of the potential risks of receiving the Services via telemedicine include:  Delay or interruption in medical evaluation due to technological equipment failure or disruption; Information transmitted may not be sufficient (e.g. poor resolution of images) to  allow for appropriate medical decision making by the Practitioner; and/or  In rare instances, security protocols could fail, causing a breach of personal health information.  Furthermore, I acknowledge that it is my responsibility to provide information about my medical history, conditions and care that is complete and accurate to the best of my ability. I acknowledge that Practitioner's advice, recommendations, and/or decision may be based on factors not within their control, such as incomplete or inaccurate data provided by me or distortions of diagnostic images or specimens that may result from electronic transmissions. I understand that the practice of medicine is not an exact science and that Practitioner makes no warranties or guarantees regarding treatment outcomes. I acknowledge that a copy of this consent can be made available to me via my patient portal [facility] MyChart), or I can request a printed copy by calling the office of [facility].    I understand that my insurance will be billed for this visit.   I have read or had this consent read to me. I understand the contents of this consent, which adequately explains the benefits and risks of the Services being provided via telemedicine.  I have been provided ample opportunity to ask questions regarding this consent and the Services and have had my questions answered to my satisfaction. I give my informed consent for the services to be provided through the use of telemedicine in my medical care

## 2023-11-23 NOTE — Telephone Encounter (Unsigned)
 Copied from CRM [id number]. Topic: Clinical - Medication Refill >> Nov 23, 2023  9:34 AM Everette C wrote: Medication: omeprazole  (PRILOSEC) 40 MG capsule [[id number]]  Has the patient contacted their pharmacy? Yes (Agent: If no, request that the patient contact the pharmacy for the refill. If patient does not wish to contact the pharmacy document the reason why and proceed with request.) (Agent: If yes, when and what did the pharmacy advise?)  This is the patient's preferred pharmacy:  [phone] - [place], [place] - [address] [place] [address] Phone: [phone] Fax: [phone]  Is this the correct pharmacy for this prescription? Yes If no, delete pharmacy and type the correct one.   Has the prescription been filled recently? Yes  Is the patient out of the medication? Yes  Has the patient been seen for an appointment in the last year OR does the patient have an upcoming appointment? Yes  Can we respond through MyChart? No  Agent: Please be advised that Rx refills may take up to 3 business days. We ask that you follow-up with your pharmacy.

## 2023-11-23 NOTE — Telephone Encounter (Signed)
 Pt has been tele 11/28/23. Med rec and consent are done.

## 2023-11-23 NOTE — Telephone Encounter (Signed)
 I spoke with patient.  She had chest pain last night. Relieved with one NTG and Mylanta.  Also had a lot of gas and burping.  She has been out of omeprazole  but is getting it refilled. Patient reports she had one other episode of chest pain about a week ago.  Relieved with NTG. Patient due for follow up appointment with Dr Pietro.  Has surgical clearance telephone visit scheduled for 11/24.  Patient advised in person visit recommended due to new symptoms.  Appointment made for patient to see Dr Pietro on 11/24 at 1:40.

## 2023-11-23 NOTE — Telephone Encounter (Signed)
 Pt returning call

## 2023-11-23 NOTE — Telephone Encounter (Signed)
  Pt c/o of Chest Pain: STAT if active CP, including tightness, pressure, jaw pain, radiating pain to shoulder/upper arm/back, CP unrelieved by Nitro. Symptoms reported of SOB, nausea, vomiting, sweating.  1. Are you having CP right now?   No - last night  2. Are you experiencing any other symptoms (ex. SOB, nausea, vomiting, sweating)?   Nausea, sweating   3. Is your CP continuous or coming and going?   Coming and going  4. Have you taken Nitroglycerin ?   Yes - Took 1 last night  5. How long have you been experiencing CP?   Patient stated her chest pain lasted for about an hour last night  6. If NO CP at time of call then end call with telling Pt to call back or call 911 if Chest pain returns prior to return call from triage team.   Patient stated she had chest pains last night but is not having any chest pains right now.  Patient stated she has been having some indigestion and she has been out of her omeprazole .

## 2023-11-24 MED ORDER — OMEPRAZOLE 40 MG PO CPDR: 40.0000 mg | DELAYED_RELEASE_CAPSULE | Freq: Every day | ORAL | 1 refills | Status: AC

## 2023-11-27 NOTE — Progress Notes (Unsigned)
 HPI: FU atrial fibrillation. She has a history of paroxysmal atrial fibrillation and has had previous ablation in [place]. Carotid Dopplers February 2018 showed no significant stenosis. Transesophageal echocardiogram January 2020 in [place] showed normal LV function, mild mitral regurgitation and small secundum ASD.  Had implantable loop placed previously.  Cardiac CTA September 2022 showed calcium score 111 which was 67 percentile and minimal nonobstructive coronary artery disease. ABIs December 2022 moderate bilaterally.  Last cardioversion March 2023.   Echocardiogram April 2025 showed normal LV function, mild aortic stenosis (mean gradient 6 mmHg aortic valve area 1.25 cm).  She was seen by Dr. Nancey as she was having recurrent atrial fibrillation.  He recommended changing from Multaq  to dofetilide.  Was seen at [facility] and Multaq  continued.  Since last seen patient continues to have occasional chest pain.  It is diffuse in the epigastric area and she has had this intermittently for quite some time.  No associated symptoms.  She does have some dyspnea and she describes occasional pedal edema.  Current Outpatient Medications  Medication Sig Dispense Refill   Artificial Tears ophthalmic solution Place 1 drop into both eyes 3 (three) times daily as needed (for dryness).     BENADRYL  ALLERGY 25 MG tablet Take 25 mg by mouth every 6 (six) hours as needed for itching.     busPIRone  (BUSPAR ) 5 MG tablet Take 0.5 tablets (2.5 mg total) by mouth 2 (two) times daily. 60 tablet 0   diltiazem  (CARDIZEM ) 30 MG tablet TAKE 1 TABLET BY MOUTH EVERY 4 HOURS AS NEEDED FOR AFIB HEART RATE >100 AS LONG AS TOP BP >100 90 tablet 1   diltiazem  (CARTIA  XT) 300 MG 24 hr capsule TAKE 1 CAPSULE BY MOUTH DAILY 90 capsule 3   DULoxetine  (CYMBALTA ) 60 MG capsule TAKE 1 CAPSULE BY MOUTH TWICE  DAILY 200 capsule 2   ELIQUIS  5 MG TABS tablet TAKE 1 TABLET BY MOUTH TWICE  DAILY 200 tablet 2   ezetimibe  (ZETIA ) 10 MG  tablet TAKE 1 TABLET BY MOUTH DAILY 100 tablet 2   furosemide  (LASIX ) 20 MG tablet TAKE 1 TABLET BY MOUTH EVERY DAY 90 tablet 1   levothyroxine  (SYNTHROID ) 75 MCG tablet TAKE 1 TABLET BY MOUTH DAILY  BEFORE BREAKFAST 100 tablet 2   MULTAQ  400 MG tablet TAKE 1 TABLET BY MOUTH TWICE  DAILY WITH MEALS 200 tablet 2   nitroGLYCERIN  (NITROSTAT ) 0.4 MG SL tablet DISSOLVE 1 TABLET UNDER THE  TONGUE EVERY 5 MINUTES AS NEEDED FOR CHEST PAIN. MAX OF 3 TABLETS IN 15 MINUTES. CALL 911 IF PAIN  PERSISTS. 25 tablet 3   omeprazole  (PRILOSEC) 40 MG capsule Take 1 capsule (40 mg total) by mouth daily. 90 capsule 1   oxybutynin  (DITROPAN -XL) 10 MG 24 hr tablet TAKE 1 TABLET BY MOUTH TWICE  DAILY 200 tablet 1   tirzepatide  (MOUNJARO ) 7.5 MG/0.5ML Pen INJECT THE CONTENTS OF ONE PEN  SUBCUTANEOUSLY WEEKLY AS  DIRECTED 6 mL 1   TYLENOL  500 MG tablet Take 500-1,000 mg by mouth every 6 (six) hours as needed for mild pain or headache.     No current facility-administered medications for this visit.     Past Medical History:  Diagnosis Date   Chronic pain    Depression    Diabetes mellitus without complication (HCC)    GERD (gastroesophageal reflux disease)    History of TIA (transient ischemic attack)    Hyperlipidemia    Hyperopia of both eyes with  astigmatism and presbyopia 12/21/2019   Hypertension    Lyme disease    Paroxysmal atrial fibrillation (HCC)    Sleep apnea    Thyroid  disease     Past Surgical History:  Procedure Laterality Date   ATRIAL FIBRILLATION ABLATION     x2 in [place]   CARDIAC CATHETERIZATION     CARDIOVERSION N/A 03/05/2021   Procedure: CARDIOVERSION;  Surgeon: Hobart Powell BRAVO, MD;  Location: [facility] ENDOSCOPY;  Service: Cardiovascular;  Laterality: N/A;   CERVICAL SPINE SURGERY     GIVENS CAPSULE STUDY N/A 07/25/2022   Procedure: GIVENS CAPSULE STUDY;  Surgeon: Kriss Estefana DEL, DO;  Location: [facility] ENDOSCOPY;  Service: Gastroenterology;  Laterality: N/A;   implantable loop recorder  placement  11/07/2017   MDT LINQ1 implanted in [place] for afib management by Dr Dennise   LOOP RECORDER INSERTION N/A 11/06/2021   Procedure: LOOP RECORDER INSERTION;  Surgeon: Nancey Eulas BRAVO, MD;  Location: [facility] INVASIVE CV LAB;  Service: Cardiovascular;  Laterality: N/A;   LOOP RECORDER REMOVAL N/A 11/06/2021   Procedure: LOOP RECORDER REMOVAL;  Surgeon: Nancey Eulas BRAVO, MD;  Location: [facility] INVASIVE CV LAB;  Service: Cardiovascular;  Laterality: N/A;   LUMBAR SPINE SURGERY     TONSILLECTOMY AND ADENOIDECTOMY      Social History   Socioeconomic History   Marital status: Married    Spouse name: Not on file   Number of children: 5   Years of education: Not on file   Highest education level: Some college, no degree  Occupational History   Not on file  Tobacco Use   Smoking status: Former   Smokeless tobacco: Never   Tobacco comments:    Former smoker 11/12/2020  Vaping Use   Vaping status: Never Used  Substance and Sexual Activity   Alcohol  use: Not Currently    Alcohol /week: 2.0 standard drinks of alcohol     Types: 2 Standard drinks or equivalent per week    Comment: mix drinks occ.   Drug use: Never   Sexual activity: Not on file  Other Topics Concern   Not on file  Social History Narrative   From [place], married, [place], new from [place]  to [place]  07/2019   Right handed   Some caffeine use    Lives with husband, daughter, and son-in-law   Social Drivers of Health   Financial Resource Strain: High Risk (10/25/2023)   Overall Financial Resource Strain (CARDIA)    Difficulty of Paying Living Expenses: Very hard  Food Insecurity: Food Insecurity Present (10/25/2023)   Hunger Vital Sign    Worried About Running Out of Food in the Last Year: Often true    Ran Out of Food in the Last Year: Often true  Transportation Needs: No Transportation Needs (10/25/2023)   PRAPARE - Administrator, Civil Service (Medical): No    Lack of  Transportation (Non-Medical): No  Physical Activity: Inactive (10/25/2023)   Exercise Vital Sign    Days of Exercise per Week: 0 days    Minutes of Exercise per Session: 0 min  Stress: Stress Concern Present (10/25/2023)   [employer] of Occupational Health - Occupational Stress Questionnaire    Feeling of Stress: Rather much  Social Connections: Unknown (10/25/2023)   Social Connection and Isolation Panel    Frequency of Communication with Friends and Family: Not on file    Frequency of Social Gatherings with Friends and Family: Twice a week    Attends Religious  Services: More than 4 times per year    Active Member of Clubs or Organizations: Yes    Attends Banker Meetings: 1 to 4 times per year    Marital Status: Married  Catering Manager Violence: Not At Risk (10/25/2023)   Humiliation, Afraid, Rape, and Kick questionnaire    Fear of Current or Ex-Partner: No    Emotionally Abused: No    Physically Abused: No    Sexually Abused: No    Family History  Problem Relation Age of Onset   CAD Mother    Thyroid  disease Mother    CAD Father    Thyroid  disease Daughter    Hypothyroidism Daughter     ROS: no fevers or chills, productive cough, hemoptysis, dysphasia, odynophagia, melena, hematochezia, dysuria, hematuria, rash, seizure activity, orthopnea, PND, pedal edema, claudication. Remaining systems are negative.  Physical Exam: Well-developed well-nourished in no acute distress.  Skin is warm and dry.  HEENT is normal.  Neck is supple.  Chest is clear to auscultation with normal expansion.  Cardiovascular exam is regular rate and rhythm.  Abdominal exam nontender or distended. No masses palpated. Extremities show no edema. neuro grossly intact  EKG Interpretation Date/Time:  Monday November 28 2023 13:49:42 EST Ventricular Rate:  86 PR Interval:  168 QRS Duration:  88 QT Interval:  374 QTC Calculation: 447 R Axis:   49  Text  Interpretation: Normal sinus rhythm Normal ECG Confirmed by Pietro Rogue ([id number]) on 11/28/2023 2:15:24 PM    A/P  1 paroxysmal atrial fibrillation-patient is status post ablation x 2 and has an implantable loop monitor in place.  Previously seen by Dr. Nancey and recommended changing Multaq  to dofetilide but she was hesitant and continues to want no changes (she will consider changing if she has more frequent episodes.  Will continue Multaq , Cardizem  and apixaban .  2 hypertension-patient's blood pressure is controlled.  Continue present medications.  3 hyperlipidemia- She previously did not tolerate Crestor or Lipitor.  Resume pravastatin  40 mg daily and Zetia  10 mg daily.  Check lipids and liver in 8 weeks.  4 chronic diastolic ingestive heart failure-patient appears to be euvolemic today.  Continue diuretic as needed.  5 nonobstructive coronary artery disease-continue statin.  She denies chest pain.  6 history of small secundum ASD-no RV dilatation noted on most recent echocardiogram.  7 history of syncope-implantable loop monitor in place.  8 obstructive sleep apnea-uses an oral device for this.  9 question aortic stenosis-this will be reassessed with follow-up echoes in the future.  10 patient requires epidural in the near future.  She may hold apixaban  2 days prior to the procedure and resume the day after.  11 chest pain-patient is having intermittent chest pain.  Will repeat cardiac CTA.  Rogue Pietro, MD

## 2023-11-28 ENCOUNTER — Ambulatory Visit: Payer: Medicare Other

## 2023-11-28 ENCOUNTER — Ambulatory Visit

## 2023-11-28 ENCOUNTER — Other Ambulatory Visit: Payer: Self-pay | Admitting: Medical

## 2023-11-28 ENCOUNTER — Encounter: Payer: Self-pay | Admitting: Cardiology

## 2023-11-28 ENCOUNTER — Ambulatory Visit: Attending: Cardiology | Admitting: Cardiology

## 2023-11-28 ENCOUNTER — Telehealth: Payer: Self-pay

## 2023-11-28 VITALS — BP 110/60 | HR 87 | Ht 61.0 in | Wt 126.2 lb

## 2023-11-28 DIAGNOSIS — I2510 Atherosclerotic heart disease of native coronary artery without angina pectoris: Secondary | ICD-10-CM | POA: Diagnosis not present

## 2023-11-28 DIAGNOSIS — E7800 Pure hypercholesterolemia, unspecified: Secondary | ICD-10-CM

## 2023-11-28 DIAGNOSIS — I480 Paroxysmal atrial fibrillation: Secondary | ICD-10-CM

## 2023-11-28 DIAGNOSIS — I4819 Other persistent atrial fibrillation: Secondary | ICD-10-CM | POA: Diagnosis not present

## 2023-11-28 DIAGNOSIS — R079 Chest pain, unspecified: Secondary | ICD-10-CM | POA: Diagnosis not present

## 2023-11-28 DIAGNOSIS — R072 Precordial pain: Secondary | ICD-10-CM | POA: Diagnosis not present

## 2023-11-28 DIAGNOSIS — I10 Essential (primary) hypertension: Secondary | ICD-10-CM

## 2023-11-28 MED ORDER — PRAVASTATIN SODIUM 40 MG PO TABS: 40.0000 mg | ORAL_TABLET | Freq: Every evening | ORAL | 3 refills | Status: AC

## 2023-11-28 MED ORDER — EZETIMIBE 10 MG PO TABS: 10.0000 mg | ORAL_TABLET | Freq: Every day | ORAL | 2 refills | Status: AC

## 2023-11-28 MED ORDER — METOPROLOL TARTRATE 100 MG PO TABS
ORAL_TABLET | ORAL | 0 refills | Status: AC
Start: 2023-11-28 — End: ?

## 2023-11-28 NOTE — Telephone Encounter (Signed)
 Last appt. 10/06/23.

## 2023-11-28 NOTE — Telephone Encounter (Unsigned)
 Copied from CRM #[id number]. Topic: Clinical - Prescription Issue >> Nov 28, 2023 12:48 PM Sandra Ruiz wrote: Reason for CRM: Pt called in stating that she need a sensor not a pump to track her blood sugar. Pt stated she isnt on insulin . Informed pt with info and also advised pt of turn around time.

## 2023-11-28 NOTE — Patient Instructions (Signed)
 Medication Instructions:   START PRAVASTATIN  40 MG ONCE DAILY  START EZETIMIBE  10 MG ONCE DAILY  *If you need a refill on your cardiac medications before your next appointment, please call your pharmacy*  Lab Work:  Your physician recommends that you return for lab work in: 8 [facility]  If you have labs (blood work) drawn today and your tests are completely normal, you will receive your results only by: MyChart Message (if you have MyChart) OR A paper copy in the mail If you have any lab test that is abnormal or we need to change your treatment, we will call you to review the results.  Testing/Procedures:    Your cardiac CT will be scheduled at    [facility] [address]  [place], [place] [address]   If scheduled at the Heart and Vascular Tower at [address], please enter the parking lot using the [address] entrance and use the FREE valet service at the patient drop-off area. Enter the building and check-in with registration on the main floor.   Please follow these instructions carefully (unless otherwise directed):  An IV will be required for this test and Nitroglycerin  will be given.    On the Night Before the Test: Be sure to Drink plenty of water. Do not consume any caffeinated/decaffeinated beverages or chocolate 12 hours prior to your test. Do not take any antihistamines 12 hours prior to your test.  On the Day of the Test: Drink plenty of water until 1 hour prior to the test. Do not eat any food 1 hour prior to test. You may take your regular medications prior to the test.  Take metoprolol  (Lopressor ) 100 MG two hours prior to test. DO NOT TAKE FUROSEMIDE  THE MORNING OF THE CT SCAN FEMALES- please wear underwire-free bra if available, avoid dresses & tight clothing       After the Test: Drink plenty of water. After receiving IV contrast, you may experience a mild flushed feeling. This is normal. On occasion,  you may experience a mild rash up to 24 hours after the test. This is not dangerous. If this occurs, you can take Benadryl  25 mg, Zyrtec, Claritin, or Allegra and increase your fluid intake. (Patients taking Tikosyn should avoid Benadryl , and may take Zyrtec, Claritin, or Allegra) If you experience trouble breathing, this can be serious. If it is severe call 911 IMMEDIATELY. If it is mild, please call our office.  We will call to schedule your test 2-4 weeks out understanding that some insurance companies will need an authorization prior to the service being performed.   For more information and frequently asked questions, please visit our website : [email or web address]  For non-scheduling related questions, please contact the cardiac imaging nurse navigator should you have any questions/concerns: Cardiac Imaging Nurse Navigators Direct Office Dial: [phone]   For scheduling needs, including cancellations and rescheduling, please call Brittany, [phone].   Follow-Up: At [facility], you and your health needs are our priority.  As part of our continuing mission to provide you with exceptional heart care, our providers are all part of one team.  This team includes your primary Cardiologist (physician) and Advanced Practice Providers or APPs (Physician Assistants and Nurse Practitioners) who all work together to provide you with the care you need, when you need it.  Your next appointment:   6 month(s)  Provider:   REDELL SHALLOW MD

## 2023-11-28 NOTE — Telephone Encounter (Unsigned)
 Copied from CRM #[id number]. Topic: Clinical - Medication Refill >> Nov 28, 2023 12:49 PM Ahlexyia S wrote: Medication: busPIRone  (BUSPAR ) 5 MG tablet  Has the patient contacted their pharmacy? No (Agent: If no, request that the patient contact the pharmacy for the refill. If patient does not wish to contact the pharmacy document the reason why and proceed with request.) (Agent: If yes, when and what did the pharmacy advise?)  This is the patient's preferred pharmacy:  [id number] - [place], [place] - [address] [place] [place] [address] Phone: [phone] Fax: [phone]  Is this the correct pharmacy for this prescription? Yes If no, delete pharmacy and type the correct one.   Has the prescription been filled recently? No  Is the patient out of the medication? Yes  Has the patient been seen for an appointment in the last year OR does the patient have an upcoming appointment? Yes  Can we respond through MyChart? Yes  Agent: Please be advised that Rx refills may take up to 3 business days. We ask that you follow-up with your pharmacy.

## 2023-11-29 ENCOUNTER — Other Ambulatory Visit: Payer: Self-pay | Admitting: Internal Medicine

## 2023-11-29 MED ORDER — DEXCOM G7 SENSOR MISC: 5 refills | Status: DC

## 2023-11-29 MED ORDER — DEXCOM G7 RECEIVER DEVI: 0 refills | Status: AC

## 2023-11-29 MED ORDER — BUSPIRONE HCL 5 MG PO TABS: 2.5000 mg | ORAL_TABLET | Freq: Two times a day (BID) | ORAL | 0 refills | Status: AC

## 2023-11-29 MED ORDER — DEXCOM G7 SENSOR MISC: 5 refills | Status: AC

## 2023-11-29 MED ORDER — DEXCOM G7 RECEIVER DEVI: 0 refills | Status: DC

## 2023-11-29 NOTE — Telephone Encounter (Signed)
 We only ment to send in dexcom not insulin  pump. We have re-sent this in at sensor and reciever only   Copied from CRM #[id number]. Topic: Clinical - Medical Advice >> Nov 28, 2023  4:19 PM Leonette SQUIBB wrote: Reason for CRM:  Sandra Gravely  RN Pump trainer with [employer] calling to say this patient is on Mounjaro  not an insulin  pump. The patient wants a sensor like Dexcom'  [name] thinks there was a mixup on what she was ordered.

## 2023-11-29 NOTE — Telephone Encounter (Signed)
 See 11/28/23 office note.

## 2023-12-01 ENCOUNTER — Ambulatory Visit: Payer: Self-pay | Admitting: Cardiology

## 2023-12-01 DIAGNOSIS — E7800 Pure hypercholesterolemia, unspecified: Secondary | ICD-10-CM

## 2023-12-01 LAB — HEPATIC FUNCTION PANEL
ALT: 16 IU/L (ref 0–32)
AST: 29 IU/L (ref 0–40)
Albumin: 4.2 g/dL (ref 3.8–4.8)
Alkaline Phosphatase: 87 IU/L (ref 49–135)
Bilirubin Total: 0.4 mg/dL (ref 0.0–1.2)
Bilirubin, Direct: 0.14 mg/dL (ref 0.00–0.40)
Total Protein: 5.8 g/dL — ABNORMAL LOW (ref 6.0–8.5)

## 2023-12-01 LAB — LIPID PANEL
Chol/HDL Ratio: 3 ratio (ref 0.0–4.4)
Cholesterol, Total: 164 mg/dL (ref 100–199)
HDL: 54 mg/dL (ref >39)
LDL Chol Calc (NIH): 97 mg/dL (ref 0–99)
Triglycerides: 65 mg/dL (ref 0–149)
VLDL Cholesterol Cal: 13 mg/dL (ref 5–40)

## 2023-12-02 ENCOUNTER — Other Ambulatory Visit (HOSPITAL_COMMUNITY): Payer: Self-pay

## 2023-12-02 ENCOUNTER — Telehealth: Payer: Self-pay

## 2023-12-02 NOTE — Telephone Encounter (Signed)
 Pt must meet 1 of the 3 requirements. At this time she doesn't meet the requirements for Continuous Glucose Monitoring supplies.

## 2023-12-03 ENCOUNTER — Encounter: Payer: Self-pay | Admitting: Cardiology

## 2023-12-05 ENCOUNTER — Ambulatory Visit: Payer: Medicare Other

## 2023-12-05 NOTE — Telephone Encounter (Signed)
 Pt has not had any readings under 54. Pt states they have been in normal ranges.  Advised her that her insurance will not cover any readers

## 2023-12-05 NOTE — Telephone Encounter (Signed)
 Left message for pt to call back and also sent mychart message

## 2023-12-06 NOTE — Telephone Encounter (Signed)
 Surgical office asking clearance be faxed. Please advise.   Surgeon:  Dr. Deatrice Manus Surgeon's Group or Practice Name:  [facility] Phone number:  [phone] Fax number:  [phone]

## 2023-12-08 ENCOUNTER — Ambulatory Visit

## 2023-12-08 ENCOUNTER — Other Ambulatory Visit: Payer: Self-pay | Admitting: Licensed Clinical Social Worker

## 2023-12-08 NOTE — Patient Instructions (Signed)
 Visit Information  Thank you for taking time to visit with me today. Please don't hesitate to contact me if I can be of assistance to you before our next scheduled appointment.  Your next care management appointment is by telephone on 01/12/24 at 9:30 AM  Please call the care guide team at [phone] if you need to cancel, schedule, or reschedule an appointment.   Please call the Suicide and Crisis Lifeline: 988 go to [facility] [address] [place] [phone]) call 911 if you are experiencing a Mental Health or Behavioral Health Crisis or need someone to talk to.  Rolin Kerns, LCSW [place]  [facility] Clinical Social Worker Direct Dial: [phone]  Fax: [phone] Website: [email or web address] 10:54 AM

## 2023-12-08 NOTE — Telephone Encounter (Signed)
 Lauren calling to receive a clearance update. Please send to [phone]

## 2023-12-08 NOTE — Telephone Encounter (Signed)
 I will re-fax the notes from Dr. Pietro last ov note. See the notes from Damien Braver, NP as she faxed the same notes on 12/06/23.

## 2023-12-08 NOTE — Telephone Encounter (Signed)
 Awaiting cardiac CT

## 2023-12-08 NOTE — Telephone Encounter (Signed)
 I will forward this to preop APP to review Dr. Vertie harper notes to see if pt has been cleared. Looks to be cardiac testing was ordered for the pt.

## 2023-12-08 NOTE — Patient Outreach (Addendum)
 Complex Care Management   Visit Note  12/08/2023  Name:  Sandra Ruiz MRN: [id number] DOB: May 01, 1945  Situation: Referral received for Complex Care Management related to Mental/Behavioral Health diagnosis Anxiety and Depression I obtained verbal consent from Patient.  Visit completed with Patient  on the phone  Background:   Past Medical History:  Diagnosis Date   Chronic pain    Depression    Diabetes mellitus without complication (HCC)    GERD (gastroesophageal reflux disease)    History of TIA (transient ischemic attack)    Hyperlipidemia    Hyperopia of both eyes with astigmatism and presbyopia 12/21/2019   Hypertension    Lyme disease    Paroxysmal atrial fibrillation (HCC)    Sleep apnea    Thyroid  disease     Assessment: Patient Reported Symptoms:  Cognitive Cognitive Status: No symptoms reported, Alert and oriented to person, place, and time, Normal speech and language skills Cognitive/Intellectual Conditions Management [RPT]: None reported or documented in medical history or problem list   Health Maintenance Behaviors: Annual physical exam  Neurological Neurological Review of Symptoms: Not assessed    HEENT HEENT Symptoms Reported: Not assessed      Cardiovascular Cardiovascular Symptoms Reported: No symptoms reported, Swelling in legs or feet Cardiovascular Management Strategies: Routine screening, Medication therapy Cardiovascular Comment: Pt is needing a medication clearance from Cardiology to send to Pain Management clinic  Respiratory Respiratory Symptoms Reported: Not assesed    Endocrine Endocrine Symptoms Reported: Not assessed    Gastrointestinal Gastrointestinal Symptoms Reported: Not assessed      Genitourinary Genitourinary Symptoms Reported: Not assessed    Integumentary Integumentary Symptoms Reported: Not assessed    Musculoskeletal Musculoskelatal Symptoms Reviewed: Back pain Musculoskeletal Management Strategies: Coping strategies,  Medication therapy Musculoskeletal Comment: Pt reports [facility] will not schedule a f/up appt without a medication clearance from Cardiologist.      Psychosocial Psychosocial Symptoms Reported: Alteration in sleep habits, Other Other Psychosocial Conditions: Chronic Pain Behavioral Management Strategies: Coping strategies, Support system, Medication therapy Major Change/Loss/Stressor/Fears (CP): Medical condition, self Techniques to Cope with Loss/Stress/Change: Diversional activities      12/08/2023    PHQ2-9 Depression Screening   Little interest or pleasure in doing things    Feeling down, depressed, or hopeless    PHQ-2 - Total Score    Trouble falling or staying asleep, or sleeping too much    Feeling tired or having little energy    Poor appetite or overeating     Feeling bad about yourself - or that you are a failure or have let yourself or your family down    Trouble concentrating on things, such as reading the newspaper or watching television    Moving or speaking so slowly that other people could have noticed.  Or the opposite - being so fidgety or restless that you have been moving around a lot more than usual    Thoughts that you would be better off dead, or hurting yourself in some way    PHQ2-9 Total Score    If you checked off any problems, how difficult have these problems made it for you to do your work, take care of things at home, or get along with other people    Depression Interventions/Treatment      There were no vitals filed for this visit.    Medications Reviewed Today     Reviewed by Ezzard Rolin BIRCH, LCSW (Social Worker) on 12/08/23 at [phone]  Med List  Status: <None>   Medication Order Taking? Sig Documenting Provider Last Dose Status Informant  Artificial Tears ophthalmic solution [id number]  Place 1 drop into both eyes 3 (three) times daily as needed (for dryness). [provider]  Active Self  BENADRYL  ALLERGY 25 MG tablet  [id number]  Take 25 mg by mouth every 6 (six) hours as needed for itching. [provider]  Active Self  busPIRone  (BUSPAR ) 5 MG tablet [id number]  Take 0.5 tablets (2.5 mg total) by mouth 2 (two) times daily. Tysinger, Alm RAMAN, PA-C  Active   Continuous Glucose Receiver (DEXCOM G7 RECEIVER) [place]VI [id number]  Use every 10 days TysingerAlm RAMAN, PA-C  Active   Continuous Glucose Sensor (DEXCOM G7 SENSOR) [place] [id number]  Use every 10 days Bulah Alm RAMAN DEVONNA  Active   diltiazem  (CARDIZEM ) 30 MG tablet [id number]  TAKE 1 TABLET BY MOUTH EVERY 4 HOURS AS NEEDED FOR AFIB HEART RATE >100 AS LONG AS TOP BP >100 Fenton, Clint R, PA  Active Self  diltiazem  (CARTIA  XT) 300 MG 24 hr capsule [id number]  TAKE 1 CAPSULE BY MOUTH DAILY Mealor, Augustus E, MD  Active   DULoxetine  (CYMBALTA ) 60 MG capsule [id number]  TAKE 1 CAPSULE BY MOUTH TWICE  DAILY Bulah Alm RAMAN, PA-C  Active   ELIQUIS  5 MG TABS tablet [id number]  TAKE 1 TABLET BY MOUTH TWICE  DAILY Tysinger, Alm RAMAN, PA-C  Active   ezetimibe  (ZETIA ) 10 MG tablet [id number]  Take 1 tablet (10 mg total) by mouth daily. Pietro Redell RAMAN, MD  Active   furosemide  (LASIX ) 20 MG tablet [id number]  TAKE 1 TABLET BY MOUTH EVERY DAY Tysinger, Alm RAMAN, PA-C  Active            Med Note TEODORA, LUCIENNE JULIANNA Schaumann Oct 06, 2023  3:36 PM) As needed  levothyroxine  (SYNTHROID ) 75 MCG tablet [id number]  TAKE 1 TABLET BY MOUTH DAILY  BEFORE BREAKFAST Bulah Alm RAMAN, PA-C  Active   metoprolol  tartrate (LOPRESSOR ) 100 MG tablet [id number]  TAKE 2 HOURS PRIOR TO CT SCAN Pietro Redell RAMAN, MD  Active   MULTAQ  400 MG tablet [id number]  TAKE 1 TABLET BY MOUTH TWICE  DAILY WITH MEALS Crenshaw, Redell RAMAN, MD  Active   nitroGLYCERIN  (NITROSTAT ) 0.4 MG SL tablet [id number]  DISSOLVE 1 TABLET UNDER THE  TONGUE EVERY 5 MINUTES AS NEEDED FOR CHEST PAIN. MAX OF 3 TABLETS IN 15 MINUTES. CALL 911 IF PAIN  PERSISTS. Pietro Redell RAMAN, MD  Active Self  omeprazole  (PRILOSEC) 40 MG capsule  [id number]  Take 1 capsule (40 mg total) by mouth daily. Tysinger, Alm RAMAN, PA-C  Active   oxybutynin  (DITROPAN -XL) 10 MG 24 hr tablet [id number]  TAKE 1 TABLET BY MOUTH TWICE  DAILY Tysinger, Alm RAMAN, PA-C  Active   pravastatin  (PRAVACHOL ) 40 MG tablet [id number]  Take 1 tablet (40 mg total) by mouth every evening. Pietro Redell RAMAN, MD  Active   tirzepatide  (MOUNJARO ) 7.5 MG/0.5ML Pen [id number]  [facility] THE CONTENTS OF ONE PEN  SUBCUTANEOUSLY WEEKLY AS  DIRECTED Tysinger, Alm RAMAN, PA-C  Active            Med Note TEODORA, LUCIENNE JULIANNA Schaumann Oct 06, 2023  3:36 PM) Emily on Wednesday  TYLENOL  500 MG tablet [id number]  Take 500-1,000 mg by mouth every 6 (six) hours as needed for mild pain or headache. [provider]  Active Self           Med  Note TEODORA LUCIENNE FALCON   Thu Oct 06, 2023  3:36 PM) As needed            Recommendation:   PCP Follow-up Specialty provider follow-up Cardiologist and Pain Med Clinic Continue Current Plan of Care  Follow Up Plan:   Telephone follow-up in 1 month  Rolin Kerns, LCSW [facility]  [facility], [facility] Clinical Social Worker Direct Dial: [phone]  Fax: [phone] Website: [email or web address] 12:59 PM

## 2023-12-09 NOTE — Telephone Encounter (Signed)
 Sent message to rx team

## 2023-12-09 NOTE — Telephone Encounter (Signed)
 Per Ludie Gent PA-C- relaying to prior auth team that she is not getting blood with glucometer, fingers not giving enough blood, and doesn't tolerate the painful lancets. Lets try again with continuous glucose monitor

## 2023-12-10 ENCOUNTER — Ambulatory Visit

## 2023-12-12 ENCOUNTER — Other Ambulatory Visit (HOSPITAL_COMMUNITY): Payer: Self-pay

## 2023-12-12 ENCOUNTER — Telehealth: Payer: Self-pay

## 2023-12-12 ENCOUNTER — Ambulatory Visit: Admitting: Medical

## 2023-12-12 LAB — CUP PACEART REMOTE DEVICE CHECK
Date Time Interrogation Session: 20251205232207
Implantable Pulse Generator Implant Date: 20231103

## 2023-12-12 NOTE — Telephone Encounter (Signed)
 Pharmacy Patient Advocate Encounter  Received notification from [employer] that Prior Authorization for Dexcom G7 Sensor has been DENIED.  Full denial letter will be uploaded to the media tab. See denial reason below.  Please note we I have sent this request to [employer] for an appeal as requested with the pts rationalepeal as requested with the pts rationale

## 2023-12-12 NOTE — Telephone Encounter (Signed)
 Pharmacy Patient Advocate Encounter   Received notification from Patient Advice Request messages that prior authorization for Dexcom G7 Sensor  is required/requested.   Insurance verification completed.   The patient is insured through [employer] [employer].   Per test claim: PA required; PA submitted to above mentioned insurance via Latent Key/confirmation #/EOC [id number] Status is pending   (Resubmitting for potential denial due to rationale of pt not producing enough blood with lanset sticks etc)

## 2023-12-13 ENCOUNTER — Telehealth: Payer: Self-pay | Admitting: Cardiology

## 2023-12-13 ENCOUNTER — Telehealth: Payer: Self-pay | Admitting: Pharmacist

## 2023-12-13 NOTE — Telephone Encounter (Signed)
 Spoke with pt, Aware of dr vertie recommendations.

## 2023-12-13 NOTE — Telephone Encounter (Signed)
 Performing office calling to f/u on clearance. Made her aware clearance is waiting on CT scheduled 12/17. Please advise.

## 2023-12-13 NOTE — Telephone Encounter (Signed)
 Pt is requesting a callback from nurse wanting to know if Dr. Pietro wants her to wait until after the CT scan to go to pain management. Please advise

## 2023-12-13 NOTE — Telephone Encounter (Signed)
 Appeal has been submitted. Will advise when response is received, please be advised that most companies may take 30 days to make a decision. Appeal letter and clinical information faxed to [phone] on 12/13/2023 @6 :38 pm.   Thank you, Devere Pandy, PharmD Clinical Pharmacist  [facility]  Direct Dial: [phone]

## 2023-12-14 NOTE — Telephone Encounter (Signed)
 We are awaiting results of coronary CTA prior to providing medical clearance. Per phone note from yesterday, patient had asked if she could go to pain management. OK to see them in consult, but we are waiting for coronary CTA prior to procedures.   Rollo FABIENE Louder, PA-C 12/14/2023 1:40 PM

## 2023-12-14 NOTE — Telephone Encounter (Signed)
 Lauren facilities manager with [facility] called in about clearance. She states pt is upset with them due to conflicting information. Pt stated she was cleared, however Lauren says we are waiting for cardiac CT. Dr. Pietro responded to phone note yesterday but still not clear on if she needs CT first or if she can go forward with injection. Please advise.

## 2023-12-15 ENCOUNTER — Other Ambulatory Visit (INDEPENDENT_AMBULATORY_CARE_PROVIDER_SITE_OTHER): Payer: Self-pay

## 2023-12-15 DIAGNOSIS — Z79899 Other long term (current) drug therapy: Secondary | ICD-10-CM

## 2023-12-15 NOTE — Progress Notes (Signed)
 12/15/2023 Name: Sandra Ruiz MRN: [id number] DOB: Jul 15, 1945  Chief Complaint  Patient presents with   Medication Management    Sandra Ruiz is a 78 y.o. year old female who presented for a telephone visit.   They were referred to the pharmacist by their PCP for assistance in managing complex medication management.    Subjective:  Care Team: Primary Care Provider: Bulah Alm GORMAN DEVONNA ; Next Scheduled Visit: 12/28/23  Medication Access/Adherence  Current Pharmacy:  [id number] - [place], [place] - [address] [place] [address] Phone: [phone] Fax: [phone]  [facility] - [place], [place] - [address] [place] [address] Phone: [phone] Fax: [phone]   Patient reports affordability concerns with their medications: No  Patient reports access/transportation concerns to their pharmacy: No  Patient reports adherence concerns with their medications:  No     Diabetes:  Current medications: Mounjaro  7.5mg  once weekly on Wednesdays Medications tried in the past: Glyburide , Metformin , Janumet , Ozempic   Current glucose readings:  Not been able to check without sensor, tries to prick finger but not getting enough blood   Hyperlipidemia/ASCVD Risk Reduction  Current lipid lowering medications: Zetia  10mg , Pravastatin  40mg  Medications tried in the past: None  Atrial Fibrillation:  Current medications:Eliquis , Multaq   Medications tried in the past: Metoprolol    Denies any signs of bleed with at risk medications  Depression/Anxiety :  Current medications:  Cymbalta  60mg  BID (also being used for nerve pain), Buspar  2.5mg  BID Medications tried in the past: None  Behavioral Health support: None  Reports her mood is improving with calmed down a fib and use of buspar , feels she may benefit from increasing buspar , plans to discuss with PCP at f/u  in 2 weeks    Objective:  Lab Results  Component Value Date   HGBA1C 5.0 05/04/2023    Lab Results  Component Value Date   CREATININE 0.76 08/31/2023   BUN 9 08/31/2023   NA 139 08/31/2023   K 3.8 08/31/2023   CL 100 08/31/2023   CO2 21 08/31/2023    Lab Results  Component Value Date   CHOL 164 11/30/2023   HDL 54 11/30/2023   LDLCALC 97 11/30/2023   TRIG 65 11/30/2023   CHOLHDL 3.0 11/30/2023    Medications Reviewed Today     Reviewed by Lionell Jon DEL, RPH (Pharmacist) on 12/15/23 at 1115  Med List Status: <None>   Medication Order Taking? Sig Documenting Provider Last Dose Status Informant  Artificial Tears ophthalmic solution [id number]  Place 1 drop into both eyes 3 (three) times daily as needed (for dryness). [provider]  Active Self  BENADRYL  ALLERGY 25 MG tablet [id number]  Take 25 mg by mouth every 6 (six) hours as needed for itching. [provider]  Active Self  busPIRone  (BUSPAR ) 5 MG tablet [id number]  Take 0.5 tablets (2.5 mg total) by mouth 2 (two) times daily. Tysinger, Alm GORMAN, PA-C  Active   Continuous Glucose Receiver [facility] G7 [place]) [place] [id number]  Use every 10 days TysingerAlm GORMAN DEVONNA  Active   Continuous Glucose Sensor (DEXCOM G7 SENSOR) [place] [id number]  Use every 10 days Bulah Alm GORMAN DEVONNA  Active   diltiazem  (CARDIZEM ) 30 MG tablet [id number]  TAKE 1 TABLET BY MOUTH EVERY 4 HOURS AS NEEDED FOR AFIB HEART RATE >100 AS LONG AS TOP BP >100 Fenton, Clint R, PA  Active Self  diltiazem  (CARTIA  XT) 300 MG 24 hr capsule [id number]  TAKE 1 CAPSULE BY MOUTH DAILY Mealor, Augustus E, MD  Active   DULoxetine  (CYMBALTA ) 60 MG capsule [id number]  TAKE 1 CAPSULE BY MOUTH TWICE  DAILY Tysinger, David S, PA-C  Active   ELIQUIS  5 MG TABS tablet [id number]  TAKE 1 TABLET BY MOUTH TWICE  DAILY Tysinger, Alm RAMAN, PA-C  Active   ezetimibe  (ZETIA ) 10 MG tablet [id number]  Take 1 tablet (10 mg total) by mouth daily. Pietro Redell RAMAN, MD   Active   furosemide  (LASIX ) 20 MG tablet [id number]  TAKE 1 TABLET BY MOUTH EVERY DAY Tysinger, Alm RAMAN, PA-C  Active            Med Note TEODORA, LUCIENNE JULIANNA Schaumann Oct 06, 2023  3:36 PM) As needed  levothyroxine  (SYNTHROID ) 75 MCG tablet [id number]  TAKE 1 TABLET BY MOUTH DAILY  BEFORE BREAKFAST Bulah Alm RAMAN, PA-C  Active   metoprolol  tartrate (LOPRESSOR ) 100 MG tablet [id number]  TAKE 2 HOURS PRIOR TO CT SCAN Pietro Redell RAMAN, MD  Active   MULTAQ  400 MG tablet [id number]  TAKE 1 TABLET BY MOUTH TWICE  DAILY WITH MEALS Crenshaw, Redell RAMAN, MD  Active   nitroGLYCERIN  (NITROSTAT ) 0.4 MG SL tablet [id number]  DISSOLVE 1 TABLET UNDER THE  TONGUE EVERY 5 MINUTES AS NEEDED FOR CHEST PAIN. MAX OF 3 TABLETS IN 15 MINUTES. CALL 911 IF PAIN  PERSISTS. Pietro Redell RAMAN, MD  Active Self  omeprazole  (PRILOSEC) 40 MG capsule [id number]  Take 1 capsule (40 mg total) by mouth daily. Tysinger, Alm RAMAN, PA-C  Active   oxybutynin  (DITROPAN -XL) 10 MG 24 hr tablet [id number]  TAKE 1 TABLET BY MOUTH TWICE  DAILY Tysinger, Alm RAMAN, PA-C  Active   pravastatin  (PRAVACHOL ) 40 MG tablet [id number]  Take 1 tablet (40 mg total) by mouth every evening. Pietro Redell RAMAN, MD  Active   tirzepatide  (MOUNJARO ) 7.5 MG/0.5ML Pen [id number]  [facility] THE CONTENTS OF ONE PEN  SUBCUTANEOUSLY WEEKLY AS  DIRECTED Tysinger, Alm RAMAN, PA-C  Active            Med Note TEODORA, LUCIENNE JULIANNA Schaumann Oct 06, 2023  3:36 PM) Emily on Wednesday  TYLENOL  500 MG tablet [id number]  Take 500-1,000 mg by mouth every 6 (six) hours as needed for mild pain or headache. [provider]  Active Self           Med Note TEODORA, LUCIENNE JULIANNA Schaumann Oct 06, 2023  3:36 PM) As needed              Assessment/Plan:   Diabetes: - Currently controlled - Reviewed long term cardiovascular and renal outcomes of uncontrolled blood sugar - Reviewed goal A1c, goal fasting, and goal 2 hour post prandial glucose -Continue current medication therapy. Notify if cost  concerns arise.  -Due for uACR at next PCP visit      Hyperlipidemia/ASCVD Risk Reduction: - Currently uncontrolled - Reviewed long term complications of uncontrolled cholesterol - Recommend to continue current med therapy, notify cardio if you have any tolerance concerns with pravastatin  and do not stop med without notifying care team    Atrial Fibrillation: - Currently controlled - Reviewed importance of adherence to anticoagulant for stroke prevention. - Reviewed appropriate blood pressure monitoring technique and reviewed goal blood pressure.  -Monitor for signs of bleed, notify of cost concerns arise.  Depression/Anxiety: - Reviewed behavioral health support strategies including social activities  and meeting with behavioral health. Patient declines any behavioral health referral at this time -Continue current medication therapy, consider dose increase on buspar  at next PCP f/u       Follow Up Plan: 3 months  [name], PharmD Clinical Pharmacist [phone]

## 2023-12-15 NOTE — Progress Notes (Signed)
 Remote Loop Recorder Transmission

## 2023-12-15 NOTE — Telephone Encounter (Signed)
 Will fax notes pre preop APP Rollo Louder, PAC:  We are awaiting results of coronary CTA prior to providing medical clearance. Per phone note from yesterday, patient had asked if she could go to pain management. OK to see them in consult, but we are waiting for coronary CTA prior to procedures.    Rollo FABIENE Louder, PA-C 12/14/2023 1:40 PM

## 2023-12-19 ENCOUNTER — Encounter (HOSPITAL_COMMUNITY): Payer: Self-pay

## 2023-12-20 NOTE — Telephone Encounter (Signed)
 Additional information has been requested from the patient's insurance in order to proceed with the appeal request. Requested information has been sent, or form has been filled out and faxed back to [phone]

## 2023-12-20 NOTE — Telephone Encounter (Signed)
 Katie with [facility] Clinical Appeals Prior Authorization team called in stating additional information was needed to overturn the denial concerning the patient receiving the Dexcom 7 sensor. I spoke with Devere Pandy and she had me transfer Izetta for further assistance.

## 2023-12-21 ENCOUNTER — Ambulatory Visit (HOSPITAL_COMMUNITY): Admission: RE | Admit: 2023-12-21 | Discharge: 2023-12-21 | Attending: Cardiology | Admitting: Cardiology

## 2023-12-21 DIAGNOSIS — I2510 Atherosclerotic heart disease of native coronary artery without angina pectoris: Secondary | ICD-10-CM | POA: Insufficient documentation

## 2023-12-21 DIAGNOSIS — R079 Chest pain, unspecified: Secondary | ICD-10-CM | POA: Insufficient documentation

## 2023-12-21 LAB — POCT I-STAT CREATININE: Creatinine, Ser: 0.8 mg/dL (ref 0.44–1.00)

## 2023-12-21 MED ORDER — NITROGLYCERIN 0.4 MG SL SUBL
0.8000 mg | SUBLINGUAL_TABLET | Freq: Once | SUBLINGUAL | Status: AC
  Administered 2023-12-21: 10:00:00 0.8 mg via SUBLINGUAL

## 2023-12-21 MED ORDER — IOHEXOL 350 MG/ML SOLN
100.0000 mL | Freq: Once | INTRAVENOUS | Status: AC | PRN
  Administered 2023-12-21: 10:00:00 100 mL via INTRAVENOUS

## 2023-12-22 ENCOUNTER — Other Ambulatory Visit (HOSPITAL_COMMUNITY): Payer: Self-pay

## 2023-12-22 ENCOUNTER — Ambulatory Visit: Payer: Self-pay | Admitting: Cardiovascular Disease

## 2023-12-26 NOTE — Telephone Encounter (Signed)
"  ° °  Patient Name: Sandra Ruiz  DOB: 17-Oct-1945 MRN: [id number]  Primary Cardiologist: Dr. Redell Shallow  Chart reviewed as part of pre-operative protocol coverage. Cardiac CTA has been completed with reasurring result and Dr. Shallow feels patient may proceed with lumbar/cervical epidural from cardiac standpoint as requested.  Per office protocol, patient can hold Eliquis  for 3 days prior to procedure.   Will route this bundled recommendation to requesting provider via Epic fax function. Please call with questions.   Javan Gonzaga N Taylor Spilde, PA-C 12/26/2023, 7:33 AM   "

## 2023-12-28 ENCOUNTER — Ambulatory Visit: Admitting: Medical

## 2023-12-28 ENCOUNTER — Other Ambulatory Visit (HOSPITAL_COMMUNITY): Payer: Self-pay

## 2023-12-28 ENCOUNTER — Encounter: Payer: Self-pay | Admitting: Medical

## 2023-12-28 VITALS — BP 118/72 | HR 68 | Wt 125.6 lb

## 2023-12-28 DIAGNOSIS — D6869 Other thrombophilia: Secondary | ICD-10-CM

## 2023-12-28 DIAGNOSIS — I4819 Other persistent atrial fibrillation: Secondary | ICD-10-CM

## 2023-12-28 DIAGNOSIS — E1159 Type 2 diabetes mellitus with other circulatory complications: Secondary | ICD-10-CM

## 2023-12-28 DIAGNOSIS — I152 Hypertension secondary to endocrine disorders: Secondary | ICD-10-CM | POA: Diagnosis not present

## 2023-12-28 DIAGNOSIS — I5032 Chronic diastolic (congestive) heart failure: Secondary | ICD-10-CM | POA: Diagnosis not present

## 2023-12-28 DIAGNOSIS — E785 Hyperlipidemia, unspecified: Secondary | ICD-10-CM

## 2023-12-28 DIAGNOSIS — E039 Hypothyroidism, unspecified: Secondary | ICD-10-CM | POA: Diagnosis not present

## 2023-12-28 DIAGNOSIS — E1169 Type 2 diabetes mellitus with other specified complication: Secondary | ICD-10-CM | POA: Diagnosis not present

## 2023-12-28 MED ORDER — BUSPIRONE HCL 5 MG PO TABS: 5.0000 mg | ORAL_TABLET | Freq: Three times a day (TID) | ORAL | 2 refills | Status: DC

## 2023-12-28 MED ORDER — FUROSEMIDE 20 MG PO TABS: 20.0000 mg | ORAL_TABLET | Freq: Every day | ORAL | 1 refills | Status: AC

## 2023-12-28 MED ORDER — LEVOTHYROXINE SODIUM 75 MCG PO TABS: 75.0000 ug | ORAL_TABLET | Freq: Every day | ORAL | 3 refills | Status: AC

## 2023-12-28 MED ORDER — MOUNJARO 7.5 MG/0.5ML ~~LOC~~ SOAJ: 7.5000 mg | SUBCUTANEOUS | 2 refills | Status: AC

## 2023-12-28 NOTE — Progress Notes (Signed)
 "  Name: Sandra Ruiz   Date of Visit: 12/28/2023   CHIEF COMPLAINT:  Chief Complaint  Patient presents with   other    3 month f/u on diabetes, got a-fib under control now, legs and feet have been swelling and pain in toes, has not had eye exam this year yet, pt. Has not had shingles vaccine but would have to get that at the pharmacy,        HPI:  Discussed the use of AI scribe software for clinical note transcription with the patient, who gave verbal consent to proceed.  History of Present Illness  Sandra Ruiz is a 78 year old female who presents for medication follow-up and evaluation of lower extremity swelling.  She experiences significant swelling in her feet, ankles, and legs. She is uncertain if this is related to her back issues or fluid retention. She has been taking furosemide  20 mg daily, which took three to four days to show effect. She did not take it today due to being out but plans to take it upon returning home.  She has a history of back problems and is awaiting an epidural injection, which has been delayed until February. She mentions a past incident during surgery where the doctor passed out, and she is unsure what happened to her back during that time during surgery (several years ago).  Sees neurosurgery currently  She is unable to check her blood sugars due to difficulty obtaining blood from her fingers. She drinks water regularly and denies increased salt intake. Her weight has been stable recently, and she has lost weight over the past year.  Her current medications include Mounjaro  7.5 mg, Pravachol  40 mg daily, omeprazole  40 mg, oxybutynin  extended-release 10 mg twice daily, Multaq  400 mg twice daily, levothyroxine  75 mcg, Zetia  10 mg daily, Cymbalta  60 mg twice daily, Eliquis  5 mg twice daily, diltiazem  300 mg daily, and Buspar  twice daily. Buspar  is helping with anxiety without making her feel like a 'zombie'.  Buspar  and paravachol recently  added.  She uses a cane for mobility but forgot it today.   Past Medical History:  Diagnosis Date   Chronic pain    Depression    Diabetes mellitus without complication (HCC)    GERD (gastroesophageal reflux disease)    History of TIA (transient ischemic attack)    Hyperlipidemia    Hyperopia of both eyes with astigmatism and presbyopia 12/21/2019   Hypertension    Lyme disease    Paroxysmal atrial fibrillation (HCC)    Sleep apnea    Thyroid  disease    Medications Ordered Prior to Encounter[1]  ROS as in subjective      OBJECTIVE:    BP 118/72   Pulse 68   Wt 125 lb 9.6 oz (57 kg)   BMI 23.73 kg/m    Wt Readings from Last 3 Encounters:  12/28/23 125 lb 9.6 oz (57 kg)  11/28/23 126 lb 3.2 oz (57.2 kg)  11/18/23 126 lb 8 oz (57.4 kg)   BP Readings from Last 3 Encounters:  12/28/23 118/72  12/21/23 106/68  11/28/23 110/60    General appearence: alert, no distress, WD/WN,  Neck: supple, no lymphadenopathy, no thyromegaly, no masses Heart: RRR, normal S1, S2, no murmurs Lungs: CTA bilaterally, no wheezes, rhonchi, or rales Extremities: minimal LE edema noted bilat, no cyanosis, no clubbing Pulses: 2+ symmetric, upper and lower extremities, normal cap refill     ASSESSMENT/PLAN:   Encounter Diagnoses  Name Primary?  Type 2 diabetes mellitus with other specified complication, without long-term current use of insulin  (HCC) Yes   Hyperlipidemia, unspecified hyperlipidemia type    Hypertension associated with diabetes (HCC)    Hypercoagulable state due to persistent atrial fibrillation (HCC)    Chronic diastolic CHF (congestive heart failure) (HCC)    Hypothyroidism, unspecified type     Chronic diastolic heart failure Swelling in lower extremities, possibly related to heart failure or back issues. Persistent despite furosemide . - Continue furosemide  20 mg daily. - Recommended wearing medium strength compression hose daily. - Advised elevating legs when  at home. - Ordered basic metabolic panel to assess kidney function and electrolytes. -continue current medicaiton, Diltiazem , Multaq , eliquis   Type 2 diabetes mellitus with other specified complication Difficulty obtaining blood samples for glucose monitoring. - Ordered hemoglobin A1c. - Advised trying alternative sites for blood glucose monitoring -continue Mounjaro  7.5mg  weekly  Persistent atrial fibrillation with hypercoagulable state - Continue Eliquis  5 mg twice daily.  Hyperlipidemia Cholesterol levels at goal on current regimen. - Continue Pravachol  40 mg daily. - Continue Zetia  10 mg daily.  Hypothyroidism Thyroid  levels within normal range on current regimen. - Continue levothyroxine  75 mcg daily.  Chronic back pain with degenerative changes Degenerative changes noted on previous imaging. Awaiting epidural in February. - Requested records from neurosurgery for updated imaging. - Advised discussing with pain management specialist about updated imaging and explanation of back condition.  General health maintenance Due for tetanus and shingles vaccinations. No recent eye exam on file. - Recommended tetanus and shingles vaccinations at pharmacy.  Anxiety -continue Cymbalta  60mg  BID, increase Bupsar to 5mg  BID or TID   Mckell was seen today for other.  Diagnoses and all orders for this visit:  Type 2 diabetes mellitus with other specified complication, without long-term current use of insulin  (HCC) -     Hemoglobin A1c  Hyperlipidemia, unspecified hyperlipidemia type  Hypertension associated with diabetes (HCC) -     Basic metabolic panel with GFR -     Microalbumin/Creatinine Ratio, Urine  Hypercoagulable state due to persistent atrial fibrillation (HCC)  Chronic diastolic CHF (congestive heart failure) (HCC)  Hypothyroidism, unspecified type  Other orders -     levothyroxine  (SYNTHROID ) 75 MCG tablet; Take 1 tablet (75 mcg total) by mouth daily before  breakfast. -     busPIRone  (BUSPAR ) 5 MG tablet; Take 1 tablet (5 mg total) by mouth 3 (three) times daily. -     tirzepatide  (MOUNJARO ) 7.5 MG/0.5ML Pen; Inject 7.5 mg into the skin once a week. -     furosemide  (LASIX ) 20 MG tablet; Take 1 tablet (20 mg total) by mouth daily.    F/u pending labs  [facility]    [1]  Current Outpatient Medications on File Prior to Visit  Medication Sig Dispense Refill   Artificial Tears ophthalmic solution Place 1 drop into both eyes 3 (three) times daily as needed (for dryness).     BENADRYL  ALLERGY 25 MG tablet Take 25 mg by mouth every 6 (six) hours as needed for itching.     busPIRone  (BUSPAR ) 5 MG tablet Take 0.5 tablets (2.5 mg total) by mouth 2 (two) times daily. 60 tablet 0   diltiazem  (CARDIZEM ) 30 MG tablet TAKE 1 TABLET BY MOUTH EVERY 4 HOURS AS NEEDED FOR AFIB HEART RATE >100 AS LONG AS TOP BP >100 90 tablet 1   diltiazem  (CARTIA  XT) 300 MG 24 hr capsule TAKE 1 CAPSULE BY MOUTH DAILY 90  capsule 3   DULoxetine  (CYMBALTA ) 60 MG capsule TAKE 1 CAPSULE BY MOUTH TWICE  DAILY 200 capsule 2   ELIQUIS  5 MG TABS tablet TAKE 1 TABLET BY MOUTH TWICE  DAILY 200 tablet 2   ezetimibe  (ZETIA ) 10 MG tablet Take 1 tablet (10 mg total) by mouth daily. 100 tablet 2   metoprolol  tartrate (LOPRESSOR ) 100 MG tablet TAKE 2 HOURS PRIOR TO CT SCAN 1 tablet 0   MULTAQ  400 MG tablet TAKE 1 TABLET BY MOUTH TWICE  DAILY WITH MEALS 200 tablet 2   nitroGLYCERIN  (NITROSTAT ) 0.4 MG SL tablet DISSOLVE 1 TABLET UNDER THE  TONGUE EVERY 5 MINUTES AS NEEDED FOR CHEST PAIN. MAX OF 3 TABLETS IN 15 MINUTES. CALL 911 IF PAIN  PERSISTS. 25 tablet 3   omeprazole  (PRILOSEC) 40 MG capsule Take 1 capsule (40 mg total) by mouth daily. 90 capsule 1   oxybutynin  (DITROPAN -XL) 10 MG 24 hr tablet TAKE 1 TABLET BY MOUTH TWICE  DAILY 200 tablet 1   pravastatin  (PRAVACHOL ) 40 MG tablet Take 1 tablet (40 mg total) by mouth every evening. 100 tablet 3    TYLENOL  500 MG tablet Take 500-1,000 mg by mouth every 6 (six) hours as needed for mild pain or headache.     Continuous Glucose Receiver (DEXCOM G7 RECEIVER) [place] Use every 10 days (Patient not taking: Reported on 12/28/2023) 1 each 0   Continuous Glucose Sensor (DEXCOM G7 SENSOR) [place] Use every 10 days (Patient not taking: Reported on 12/28/2023) 3 each 5   No current facility-administered medications on file prior to visit.   "

## 2023-12-29 ENCOUNTER — Ambulatory Visit: Payer: Self-pay | Admitting: Medical

## 2023-12-29 LAB — MICROALBUMIN / CREATININE URINE RATIO
Creatinine, Urine: 97.2 mg/dL
Microalb/Creat Ratio: 7 mg/g{creat} (ref 0–29)
Microalbumin, Urine: 7.1 ug/mL

## 2023-12-29 LAB — BASIC METABOLIC PANEL WITH GFR
BUN/Creatinine Ratio: 14 (ref 12–28)
BUN: 12 mg/dL (ref 8–27)
CO2: 24 mmol/L (ref 20–29)
Calcium: 9.1 mg/dL (ref 8.7–10.3)
Chloride: 97 mmol/L (ref 96–106)
Creatinine, Ser: 0.83 mg/dL (ref 0.57–1.00)
Glucose: 81 mg/dL (ref 70–99)
Potassium: 3.8 mmol/L (ref 3.5–5.2)
Sodium: 135 mmol/L (ref 134–144)
eGFR: 73 mL/min/1.73

## 2023-12-29 LAB — HEMOGLOBIN A1C
Est. average glucose Bld gHb Est-mCnc: 88 mg/dL
Hgb A1c MFr Bld: 4.7 % — ABNORMAL LOW (ref 4.8–5.6)

## 2023-12-29 NOTE — Progress Notes (Signed)
 Results thru my chart

## 2023-12-30 ENCOUNTER — Other Ambulatory Visit (HOSPITAL_COMMUNITY): Payer: Self-pay

## 2024-01-02 ENCOUNTER — Ambulatory Visit: Payer: Medicare Other

## 2024-01-09 ENCOUNTER — Ambulatory Visit

## 2024-01-09 ENCOUNTER — Ambulatory Visit: Payer: Medicare Other

## 2024-01-10 ENCOUNTER — Ambulatory Visit

## 2024-01-10 DIAGNOSIS — I4819 Other persistent atrial fibrillation: Secondary | ICD-10-CM

## 2024-01-10 LAB — CUP PACEART REMOTE DEVICE CHECK
Date Time Interrogation Session: 20260105232349
Implantable Pulse Generator Implant Date: 20231103

## 2024-01-11 NOTE — Progress Notes (Signed)
 Remote Loop Recorder Transmission

## 2024-01-12 ENCOUNTER — Other Ambulatory Visit: Payer: Self-pay | Admitting: Licensed Clinical Social Worker

## 2024-01-12 NOTE — Patient Instructions (Signed)
 Visit Information  Thank you for taking time to visit with me today. Please don't hesitate to contact me if I can be of assistance to you before our next scheduled appointment.  Your next care management appointment is by telephone on 2/5 at 9:30 AM  Please call the care guide team at [phone] if you need to cancel, schedule, or reschedule an appointment.   Please call the Suicide and Crisis Lifeline: 988 go to [facility] [address] [place] [phone]) call 911 if you are experiencing a Mental Health or Behavioral Health Crisis or need someone to talk to.  Rolin Kerns, LCSW [place]  [facility], [facility] Clinical Social Worker Direct Dial: [phone]  Fax: [phone] Website: [email or web address] 10:30 AM

## 2024-01-12 NOTE — Patient Outreach (Signed)
 Complex Care Management   Visit Note  01/12/2024  Name:  Sandra Ruiz MRN: [id number] DOB: 10-20-1945  Situation: Referral received for Complex Care Management related to Mental/Behavioral Health diagnosis Anxiety and Depression I obtained verbal consent from Patient.  Visit completed with Patient  on the phone  Background:   Past Medical History:  Diagnosis Date   Chronic pain    Depression    Diabetes mellitus without complication (HCC)    GERD (gastroesophageal reflux disease)    History of TIA (transient ischemic attack)    Hyperlipidemia    Hyperopia of both eyes with astigmatism and presbyopia 12/21/2019   Hypertension    Lyme disease    Paroxysmal atrial fibrillation (HCC)    Sleep apnea    Thyroid  disease     Assessment: Patient Reported Symptoms:  Cognitive Cognitive Status: No symptoms reported, Alert and oriented to person, place, and time, Normal speech and language skills Cognitive/Intellectual Conditions Management [RPT]: None reported or documented in medical history or problem list   Health Maintenance Behaviors: Annual physical exam  Neurological Neurological Review of Symptoms: Not assessed    HEENT HEENT Symptoms Reported: Not assessed      Cardiovascular Cardiovascular Symptoms Reported: No symptoms reported    Respiratory Respiratory Symptoms Reported: Not assesed    Endocrine Endocrine Symptoms Reported: Not assessed    Gastrointestinal Gastrointestinal Symptoms Reported: Not assessed      Genitourinary Genitourinary Symptoms Reported: Not assessed    Integumentary Integumentary Symptoms Reported: Not assessed    Musculoskeletal Musculoskelatal Symptoms Reviewed: Back pain Musculoskeletal Management Strategies: Coping strategies, Medication therapy, Routine screening Musculoskeletal Comment: Patient has appt with Pain Management scheduled for 02/07/24      Psychosocial Psychosocial Symptoms Reported: Other Other Psychosocial Conditions:  Pain Behavioral Management Strategies: Adequate rest, Coping strategies, Medication therapy, Support system Behavioral Health Comment: Strategies discussed to assist with pain and stress management Major Change/Loss/Stressor/Fears (CP): Medical condition, self Techniques to Cope with Loss/Stress/Change: Diversional activities, Medication Quality of Family Relationships: helpful, involved, supportive    01/12/2024    PHQ2-9 Depression Screening   Little interest or pleasure in doing things    Feeling down, depressed, or hopeless    PHQ-2 - Total Score    Trouble falling or staying asleep, or sleeping too much    Feeling tired or having little energy    Poor appetite or overeating     Feeling bad about yourself - or that you are a failure or have let yourself or your family down    Trouble concentrating on things, such as reading the newspaper or watching television    Moving or speaking so slowly that other people could have noticed.  Or the opposite - being so fidgety or restless that you have been moving around a lot more than usual    Thoughts that you would be better off dead, or hurting yourself in some way    PHQ2-9 Total Score    If you checked off any problems, how difficult have these problems made it for you to do your work, take care of things at home, or get along with other people    Depression Interventions/Treatment      There were no vitals filed for this visit.    Medications Reviewed Today     Reviewed by Zhyon Antenucci D, LCSW (Social Worker) on 01/12/24 at 1022  Med List Status: <None>   Medication Order Taking? Sig Documenting Provider Last Dose Status Informant  Artificial Tears ophthalmic  solution [id number]  Place 1 drop into both eyes 3 (three) times daily as needed (for dryness). [provider]  Active Self  BENADRYL  ALLERGY 25 MG tablet [id number]  Take 25 mg by mouth every 6 (six) hours as needed for itching. [provider]  Active Self   busPIRone  (BUSPAR ) 5 MG tablet [id number]  Take 0.5 tablets (2.5 mg total) by mouth 2 (two) times daily. Tysinger, Alm RAMAN, PA-C  Active   busPIRone  (BUSPAR ) 5 MG tablet [id number]  Take 1 tablet (5 mg total) by mouth 3 (three) times daily. Tysinger, Alm RAMAN, PA-C  Active   Continuous Glucose Receiver [facility] G7 RECEIVER) DEVI [id number]  Use every 10 days  Patient not taking: Reported on 12/28/2023   Bulah Alm RAMAN, PA-C  Active   Continuous Glucose Sensor (DEXCOM G7 SENSOR) MISC [id number]  Use every 10 days  Patient not taking: Reported on 12/28/2023   Bulah Alm RAMAN, PA-C  Active   diltiazem  (CARDIZEM ) 30 MG tablet [id number]  TAKE 1 TABLET BY MOUTH EVERY 4 HOURS AS NEEDED FOR AFIB HEART RATE >100 AS LONG AS TOP BP >100 Fenton, Clint R, PA  Active Self  diltiazem  (CARTIA  XT) 300 MG 24 hr capsule [id number]  TAKE 1 CAPSULE BY MOUTH DAILY Mealor, Augustus E, MD  Active   DULoxetine  (CYMBALTA ) 60 MG capsule [id number]  TAKE 1 CAPSULE BY MOUTH TWICE  DAILY Tysinger, David S, PA-C  Active   ELIQUIS  5 MG TABS tablet [id number]  TAKE 1 TABLET BY MOUTH TWICE  DAILY Tysinger, Alm RAMAN, PA-C  Active   ezetimibe  (ZETIA ) 10 MG tablet [id number]  Take 1 tablet (10 mg total) by mouth daily. Pietro Redell RAMAN, MD  Active   furosemide  (LASIX ) 20 MG tablet [id number]  Take 1 tablet (20 mg total) by mouth daily. Tysinger, Alm RAMAN, PA-C  Active   levothyroxine  (SYNTHROID ) 75 MCG tablet [id number]  Take 1 tablet (75 mcg total) by mouth daily before breakfast. Bulah Alm RAMAN, PA-C  Active   metoprolol  tartrate (LOPRESSOR ) 100 MG tablet [id number]  TAKE 2 HOURS PRIOR TO CT SCAN Pietro Redell RAMAN, MD  Active   MULTAQ  400 MG tablet [id number]  TAKE 1 TABLET BY MOUTH TWICE  DAILY WITH MEALS Crenshaw, Redell RAMAN, MD  Active   nitroGLYCERIN  (NITROSTAT ) 0.4 MG SL tablet [id number]  DISSOLVE 1 TABLET UNDER THE  TONGUE EVERY 5 MINUTES AS NEEDED FOR CHEST PAIN. MAX OF 3 TABLETS IN 15 MINUTES. CALL 911 IF PAIN  PERSISTS. Pietro Redell RAMAN, MD  Active Self  omeprazole  (PRILOSEC) 40 MG capsule [id number]  Take 1 capsule (40 mg total) by mouth daily. Tysinger, Alm RAMAN, PA-C  Active   oxybutynin  (DITROPAN -XL) 10 MG 24 hr tablet [id number]  TAKE 1 TABLET BY MOUTH TWICE  DAILY Tysinger, Alm RAMAN, PA-C  Active   pravastatin  (PRAVACHOL ) 40 MG tablet [id number]  Take 1 tablet (40 mg total) by mouth every evening. Pietro Redell RAMAN, MD  Active   tirzepatide  (MOUNJARO ) 7.5 MG/0.5ML Pen [id number]  Inject 7.5 mg into the skin once a week. Tysinger, Alm RAMAN, PA-C  Active   TYLENOL  500 MG tablet [id number]  Take 500-1,000 mg by mouth every 6 (six) hours as needed for mild pain or headache. [provider]  Active Self           Med Note TEODORA, LUCIENNE JULIANNA Schaumann Oct 06, 2023  3:36 PM) As needed  Recommendation:   Continue Current Plan of Care  Follow Up Plan:   Telephone follow-up in 1 month  Rolin Kerns, LCSW [facility]  [facility], [facility] Clinical Social Worker Direct Dial: [phone]  Fax: [phone] Website: [email or web address] 10:30 AM

## 2024-01-14 ENCOUNTER — Ambulatory Visit: Payer: Self-pay | Admitting: Cardiovascular Disease

## 2024-01-16 ENCOUNTER — Other Ambulatory Visit (HOSPITAL_COMMUNITY): Payer: Self-pay

## 2024-01-16 NOTE — Telephone Encounter (Signed)
 Insurance has approved the appeal for Dexcom G7 Sensors through 12/30/2024.    Thank you, Devere Pandy, PharmD Clinical Pharmacist  [place]  Direct Dial: [phone]

## 2024-01-18 ENCOUNTER — Telehealth: Payer: Self-pay | Admitting: Medical

## 2024-01-18 DIAGNOSIS — E1169 Type 2 diabetes mellitus with other specified complication: Secondary | ICD-10-CM

## 2024-01-18 DIAGNOSIS — Z0100 Encounter for examination of eyes and vision without abnormal findings: Secondary | ICD-10-CM

## 2024-01-18 NOTE — Telephone Encounter (Signed)
 Placed referral to [facility]   Unable to do [facility] on website as NPI [id number] for Sandra Ruiz is not pulling up and no [facility] is pulling up.

## 2024-01-18 NOTE — Telephone Encounter (Signed)
 Copied from CRM [id number]. Topic: Referral - Request for Referral >> Jan 18, 2024 11:48 AM Wess RAMAN wrote: Did the patient discuss referral with their provider in the last year? Yes (If No - schedule appointment) (If Yes - send message)  Appointment offered? No  Type of order/referral and detailed reason for visit: Ophthalmologist   Preference of office, provider, location: [facility] [address] [place], [place], [place] [address] Phone: [phone] Fax: [phone]  If referral order, have you been seen by this specialty before? No (If Yes, this issue or another issue? When? Where?  Can we respond through MyChart? No. Patient would like a call

## 2024-01-22 ENCOUNTER — Other Ambulatory Visit: Payer: Self-pay | Admitting: Medical

## 2024-02-06 ENCOUNTER — Ambulatory Visit: Payer: Medicare Other

## 2024-02-09 ENCOUNTER — Telehealth: Payer: Self-pay | Admitting: Hematology and Oncology

## 2024-02-09 ENCOUNTER — Other Ambulatory Visit: Payer: Self-pay | Admitting: Licensed Clinical Social Worker

## 2024-02-09 ENCOUNTER — Ambulatory Visit

## 2024-02-09 NOTE — Patient Instructions (Signed)
 Visit Information  Thank you for taking time to visit with me today. Please don't hesitate to contact me if I can be of assistance to you before our next scheduled appointment.  Your next care management appointment is by telephone on 3/5 at 9:30 AM  Please call the care guide team at [phone] if you need to cancel, schedule, or reschedule an appointment.   Please call the Suicide and Crisis Lifeline: 988 go to [facility] [address] [place] [phone]) call 911 if you are experiencing a Mental Health or Behavioral Health Crisis or need someone to talk to.  Rolin Kerns, LCSW [place]  [facility], [facility] Clinical Social Worker Direct Dial: [phone]  Fax: [phone] Website: [email or web address] 10:25 AM

## 2024-02-09 NOTE — Patient Outreach (Signed)
 Complex Care Management   Visit Note  02/09/2024  Name:  Sandra Ruiz MRN: [id number] DOB: 1945/08/15  Situation: Referral received for Complex Care Management related to Mental/Behavioral Health diagnosis Anxiety and Depression I obtained verbal consent from Patient.  Visit completed with Patient  on the phone  Background:   Past Medical History:  Diagnosis Date   Chronic pain    Depression    Diabetes mellitus without complication (HCC)    GERD (gastroesophageal reflux disease)    History of TIA (transient ischemic attack)    Hyperlipidemia    Hyperopia of both eyes with astigmatism and presbyopia 12/21/2019   Hypertension    Lyme disease    Paroxysmal atrial fibrillation (HCC)    Sleep apnea    Thyroid  disease     Assessment: Patient Reported Symptoms:  Cognitive Cognitive Status: No symptoms reported, Alert and oriented to person, place, and time, Normal speech and language skills Cognitive/Intellectual Conditions Management [RPT]: None reported or documented in medical history or problem list   Health Maintenance Behaviors: Annual physical exam  Neurological Neurological Review of Symptoms: No symptoms reported    HEENT HEENT Symptoms Reported: No symptoms reported      Cardiovascular Cardiovascular Symptoms Reported: No symptoms reported    Respiratory Respiratory Symptoms Reported: No symptoms reported    Endocrine Endocrine Symptoms Reported: No symptoms reported Is patient diabetic?: Yes Is patient checking blood sugars at home?: Yes Endocrine Comment: Patient endorsed feelings of frustration due to being denied Omnipad due to a controlled A1C. Patient reports she has to continuously check BS due to PCP's concerns it may be too low at times  Gastrointestinal Gastrointestinal Symptoms Reported: No symptoms reported      Genitourinary Genitourinary Symptoms Reported: No symptoms reported    Integumentary Integumentary Symptoms Reported: No symptoms  reported    Musculoskeletal Musculoskelatal Symptoms Reviewed: Back pain, Limited mobility Musculoskeletal Management Strategies: Adequate rest, Coping strategies, Medication therapy, Routine screening Musculoskeletal Comment: Patient visited with [facility] on 02/06/24 for back injection   Patient at Risk for Falls Due to: Impaired mobility  Psychosocial Psychosocial Symptoms Reported: Other Other Psychosocial Conditions: Pain Behavioral Management Strategies: Adequate rest, Coping strategies, Support system, Medication therapy Major Change/Loss/Stressor/Fears (CP): Medical condition, self Techniques to Cope with Loss/Stress/Change: Diversional activities, Medication Quality of Family Relationships: involved, supportive Do you feel physically threatened by others?: No    02/09/2024    PHQ2-9 Depression Screening   Little interest or pleasure in doing things    Feeling down, depressed, or hopeless    PHQ-2 - Total Score    Trouble falling or staying asleep, or sleeping too much    Feeling tired or having little energy    Poor appetite or overeating     Feeling bad about yourself - or that you are a failure or have let yourself or your family down    Trouble concentrating on things, such as reading the newspaper or watching television    Moving or speaking so slowly that other people could have noticed.  Or the opposite - being so fidgety or restless that you have been moving around a lot more than usual    Thoughts that you would be better off dead, or hurting yourself in some way    PHQ2-9 Total Score    If you checked off any problems, how difficult have these problems made it for you to do your work, take care of things at home, or get along with  other people    Depression Interventions/Treatment      There were no vitals filed for this visit.    Medications Reviewed Today     Reviewed by Takisha Pelle D, LCSW (Social Worker) on 02/09/24 at 1008  Med  List Status: <None>   Medication Order Taking? Sig Documenting Provider Last Dose Status Informant  Artificial Tears ophthalmic solution [id number]  Place 1 drop into both eyes 3 (three) times daily as needed (for dryness). [provider]  Active Self  BENADRYL  ALLERGY 25 MG tablet [id number]  Take 25 mg by mouth every 6 (six) hours as needed for itching. [provider]  Active Self  busPIRone  (BUSPAR ) 5 MG tablet [id number]  Take 0.5 tablets (2.5 mg total) by mouth 2 (two) times daily. Tysinger, Alm RAMAN, PA-C  Active   busPIRone  (BUSPAR ) 5 MG tablet [id number]  TAKE 1 TABLET BY MOUTH THREE TIMES A DAY Tysinger, Alm RAMAN, PA-C  Active   Continuous Glucose Receiver (DEXCOM G7 RECEIVER) DEVI [id number]  Use every 10 days  Patient not taking: Reported on 12/28/2023   Bulah Alm RAMAN, PA-C  Active   Continuous Glucose Sensor (DEXCOM G7 SENSOR) MISC [id number]  Use every 10 days  Patient not taking: Reported on 12/28/2023   Bulah Alm RAMAN, PA-C  Active   diltiazem  (CARDIZEM ) 30 MG tablet [id number]  TAKE 1 TABLET BY MOUTH EVERY 4 HOURS AS NEEDED FOR AFIB HEART RATE >100 AS LONG AS TOP BP >100 Fenton, Clint R, PA  Active Self  diltiazem  (CARTIA  XT) 300 MG 24 hr capsule [id number]  TAKE 1 CAPSULE BY MOUTH DAILY Mealor, Augustus E, MD  Active   DULoxetine  (CYMBALTA ) 60 MG capsule [id number]  TAKE 1 CAPSULE BY MOUTH TWICE  DAILY Tysinger, David S, PA-C  Active   ELIQUIS  5 MG TABS tablet [id number]  TAKE 1 TABLET BY MOUTH TWICE  DAILY Tysinger, Alm RAMAN, PA-C  Active   ezetimibe  (ZETIA ) 10 MG tablet [id number]  Take 1 tablet (10 mg total) by mouth daily. Pietro Redell RAMAN, MD  Active   furosemide  (LASIX ) 20 MG tablet [id number]  Take 1 tablet (20 mg total) by mouth daily. Tysinger, Alm RAMAN, PA-C  Active   levothyroxine  (SYNTHROID ) 75 MCG tablet [id number]  Take 1 tablet (75 mcg total) by mouth daily before breakfast. Bulah Alm RAMAN, PA-C  Active   metoprolol  tartrate (LOPRESSOR ) 100 MG tablet  [id number]  TAKE 2 HOURS PRIOR TO CT SCAN Pietro Redell RAMAN, MD  Active   MULTAQ  400 MG tablet [id number]  TAKE 1 TABLET BY MOUTH TWICE  DAILY WITH MEALS Crenshaw, Redell RAMAN, MD  Active   nitroGLYCERIN  (NITROSTAT ) 0.4 MG SL tablet [id number]  DISSOLVE 1 TABLET UNDER THE  TONGUE EVERY 5 MINUTES AS NEEDED FOR CHEST PAIN. MAX OF 3 TABLETS IN 15 MINUTES. CALL 911 IF PAIN  PERSISTS. Pietro Redell RAMAN, MD  Active Self  omeprazole  (PRILOSEC) 40 MG capsule [id number]  Take 1 capsule (40 mg total) by mouth daily. Tysinger, Alm RAMAN, PA-C  Active   oxybutynin  (DITROPAN -XL) 10 MG 24 hr tablet [id number]  TAKE 1 TABLET BY MOUTH TWICE  DAILY Tysinger, Alm RAMAN, PA-C  Active   pravastatin  (PRAVACHOL ) 40 MG tablet [id number]  Take 1 tablet (40 mg total) by mouth every evening. Pietro Redell RAMAN, MD  Active   tirzepatide  (MOUNJARO ) 7.5 MG/0.5ML Pen [id number]  Inject 7.5 mg into the skin once a week. Tysinger, Alm RAMAN, PA-C  Active   TYLENOL  500 MG tablet  [id number]  Take 500-1,000 mg by mouth every 6 (six) hours as needed for mild pain or headache. [provider]  Active Self           Med Note TEODORA, LUCIENNE JULIANNA Schaumann Oct 06, 2023  3:36 PM) As needed            Recommendation:   Continue Current Plan of Care  Follow Up Plan:   Telephone follow-up in 1 month  Rolin Kerns, LCSW [facility]  [facility], [facility] Clinical Social Worker Direct Dial: [phone]  Fax: [phone] Website: [email or web address] 10:19 AM

## 2024-02-09 NOTE — Telephone Encounter (Signed)
 I left voicemail for patient regarding rescheduled MD telephone appointment from 03/05/2024 to 03/06/2024.

## 2024-02-10 ENCOUNTER — Ambulatory Visit

## 2024-02-10 LAB — CUP PACEART REMOTE DEVICE CHECK
Date Time Interrogation Session: 20260205232200
Implantable Pulse Generator Implant Date: 20231103

## 2024-02-13 ENCOUNTER — Ambulatory Visit: Payer: Medicare Other

## 2024-03-01 ENCOUNTER — Other Ambulatory Visit

## 2024-03-02 ENCOUNTER — Other Ambulatory Visit

## 2024-03-05 ENCOUNTER — Telehealth: Admitting: Hematology and Oncology

## 2024-03-06 ENCOUNTER — Inpatient Hospital Stay: Admitting: Hematology and Oncology

## 2024-03-08 ENCOUNTER — Telehealth: Admitting: Licensed Clinical Social Worker

## 2024-03-12 ENCOUNTER — Ambulatory Visit

## 2024-03-12 ENCOUNTER — Ambulatory Visit: Payer: Medicare Other

## 2024-03-19 ENCOUNTER — Ambulatory Visit: Payer: Medicare Other

## 2024-04-12 ENCOUNTER — Ambulatory Visit

## 2024-04-16 ENCOUNTER — Ambulatory Visit: Payer: Medicare Other

## 2024-04-23 ENCOUNTER — Ambulatory Visit: Payer: Medicare Other

## 2024-05-04 ENCOUNTER — Encounter: Admitting: Medical

## 2024-05-14 ENCOUNTER — Ambulatory Visit

## 2024-05-21 ENCOUNTER — Ambulatory Visit: Payer: Medicare Other

## 2024-06-25 ENCOUNTER — Ambulatory Visit: Payer: Medicare Other

## 2024-07-02 ENCOUNTER — Ambulatory Visit: Payer: Medicare Other

## 2024-07-30 ENCOUNTER — Ambulatory Visit: Payer: Medicare Other

## 2024-08-06 ENCOUNTER — Ambulatory Visit: Payer: Medicare Other

## 2024-09-10 ENCOUNTER — Ambulatory Visit: Payer: Medicare Other

## 2024-10-15 ENCOUNTER — Ambulatory Visit: Payer: Medicare Other

## 2024-10-30 ENCOUNTER — Ambulatory Visit: Payer: Self-pay

## 2024-11-19 ENCOUNTER — Ambulatory Visit: Payer: Medicare Other
